# Patient Record
Sex: Female | Born: 1955 | Race: Black or African American | Hispanic: No | Smoking: Never smoker
Health system: Southern US, Community
[De-identification: ages and names within clinical notes are randomized; demographics above are authoritative.]

## PROBLEM LIST (undated history)

## (undated) DIAGNOSIS — I509 Heart failure, unspecified: Secondary | ICD-10-CM

## (undated) DIAGNOSIS — R011 Cardiac murmur, unspecified: Secondary | ICD-10-CM

## (undated) DIAGNOSIS — D869 Sarcoidosis, unspecified: Secondary | ICD-10-CM

## (undated) DIAGNOSIS — I10 Essential (primary) hypertension: Secondary | ICD-10-CM

## (undated) DIAGNOSIS — I8500 Esophageal varices without bleeding: Secondary | ICD-10-CM

## (undated) DIAGNOSIS — J45909 Unspecified asthma, uncomplicated: Secondary | ICD-10-CM

## (undated) DIAGNOSIS — K766 Portal hypertension: Secondary | ICD-10-CM

## (undated) DIAGNOSIS — D638 Anemia in other chronic diseases classified elsewhere: Secondary | ICD-10-CM

## (undated) DIAGNOSIS — F1010 Alcohol abuse, uncomplicated: Secondary | ICD-10-CM

## (undated) DIAGNOSIS — K7030 Alcoholic cirrhosis of liver without ascites: Secondary | ICD-10-CM

## (undated) DIAGNOSIS — E039 Hypothyroidism, unspecified: Secondary | ICD-10-CM

## (undated) HISTORY — PX: TUBAL LIGATION: SHX77

---

## 2008-09-27 ENCOUNTER — Emergency Department: Payer: Self-pay | Admitting: Emergency Medicine

## 2008-11-07 ENCOUNTER — Inpatient Hospital Stay: Payer: Self-pay | Admitting: Internal Medicine

## 2010-01-09 ENCOUNTER — Emergency Department: Payer: Self-pay | Admitting: Emergency Medicine

## 2010-01-10 ENCOUNTER — Inpatient Hospital Stay: Payer: Self-pay | Admitting: Internal Medicine

## 2010-02-20 ENCOUNTER — Other Ambulatory Visit: Payer: Self-pay | Admitting: Nephrology

## 2010-08-25 ENCOUNTER — Ambulatory Visit: Payer: Self-pay | Admitting: Unknown Physician Specialty

## 2011-02-17 ENCOUNTER — Ambulatory Visit: Payer: Self-pay | Admitting: Family Medicine

## 2011-02-19 ENCOUNTER — Ambulatory Visit: Payer: Self-pay | Admitting: Family Medicine

## 2013-05-15 ENCOUNTER — Inpatient Hospital Stay: Payer: Self-pay | Admitting: Specialist

## 2013-05-15 LAB — CBC
HGB: 12 g/dL (ref 12.0–16.0)
MCHC: 34.2 g/dL (ref 32.0–36.0)
MCV: 102 fL — ABNORMAL HIGH (ref 80–100)
RBC: 3.45 10*6/uL — ABNORMAL LOW (ref 3.80–5.20)
RDW: 15.6 % — ABNORMAL HIGH (ref 11.5–14.5)
WBC: 10.4 10*3/uL (ref 3.6–11.0)

## 2013-05-15 LAB — COMPREHENSIVE METABOLIC PANEL
Albumin: 3.5 g/dL (ref 3.4–5.0)
Alkaline Phosphatase: 114 U/L (ref 50–136)
Anion Gap: 12 (ref 7–16)
BUN: 10 mg/dL (ref 7–18)
Calcium, Total: 8.7 mg/dL (ref 8.5–10.1)
Chloride: 104 mmol/L (ref 98–107)
Co2: 23 mmol/L (ref 21–32)
Creatinine: 1.04 mg/dL (ref 0.60–1.30)
EGFR (African American): >60
Osmolality: 277 (ref 275–301)
SGOT(AST): 233 U/L — ABNORMAL HIGH (ref 15–37)
SGPT (ALT): 49 U/L (ref 12–78)
Total Protein: 8.7 g/dL — ABNORMAL HIGH (ref 6.4–8.2)

## 2013-05-15 LAB — CK TOTAL AND CKMB (NOT AT ARMC): CK-MB: 3 ng/mL (ref 0.5–3.6)

## 2013-05-15 LAB — APTT: Activated PTT: 28.9 secs (ref 23.6–35.9)

## 2013-05-15 LAB — PROTIME-INR
INR: >15.1
INR: 1.1
Prothrombin Time: 14.8 secs — ABNORMAL HIGH (ref 11.5–14.7)

## 2013-05-15 LAB — GASTROCCULT (ARMC): Ph, Gastric: 2 (ref 1–3)

## 2013-05-15 LAB — HEMOGLOBIN: HGB: 11.7 g/dL — ABNORMAL LOW (ref 12.0–16.0)

## 2013-05-15 LAB — TROPONIN I: Troponin-I: <0.02 ng/mL

## 2013-05-16 LAB — CBC WITH DIFFERENTIAL/PLATELET
HGB: 11.4 g/dL — ABNORMAL LOW (ref 12.0–16.0)
Lymphocyte %: 4.3 %
MCH: 35.8 pg — ABNORMAL HIGH (ref 26.0–34.0)
MCHC: 34.4 g/dL (ref 32.0–36.0)
MCV: 104 fL — ABNORMAL HIGH (ref 80–100)
Monocyte #: 0.6 x10 3/mm (ref 0.2–0.9)
Monocyte %: 5.9 %
Neutrophil #: 9.2 10*3/uL — ABNORMAL HIGH (ref 1.4–6.5)
Neutrophil %: 89.6 %
Platelet: 70 10*3/uL — ABNORMAL LOW (ref 150–440)
RDW: 15.2 % — ABNORMAL HIGH (ref 11.5–14.5)

## 2013-05-16 LAB — TROPONIN I: Troponin-I: <0.02 ng/mL

## 2013-05-16 LAB — TSH: Thyroid Stimulating Horm: 24.9 u[IU]/mL — ABNORMAL HIGH

## 2013-05-17 LAB — CBC WITH DIFFERENTIAL/PLATELET
Basophil #: 0 10*3/uL (ref 0.0–0.1)
Basophil %: 0.5 %
Eosinophil #: 0.1 10*3/uL (ref 0.0–0.7)
Eosinophil %: 1.4 %
HCT: 33.6 % — ABNORMAL LOW (ref 35.0–47.0)
Lymphocyte #: 0.5 10*3/uL — ABNORMAL LOW (ref 1.0–3.6)
MCH: 35.3 pg — ABNORMAL HIGH (ref 26.0–34.0)
MCHC: 33.8 g/dL (ref 32.0–36.0)
MCV: 105 fL — ABNORMAL HIGH (ref 80–100)
Monocyte #: 0.5 x10 3/mm (ref 0.2–0.9)
Neutrophil %: 84 %
Platelet: 79 10*3/uL — ABNORMAL LOW (ref 150–440)
RBC: 3.21 10*6/uL — ABNORMAL LOW (ref 3.80–5.20)
RDW: 15.3 % — ABNORMAL HIGH (ref 11.5–14.5)
WBC: 7 10*3/uL (ref 3.6–11.0)

## 2013-05-17 LAB — AFP TUMOR MARKER: AFP-Tumor Marker: 12.5 ng/mL — ABNORMAL HIGH (ref 0.0–8.3)

## 2013-05-17 LAB — CREATININE, SERUM
Creatinine: 1.13 mg/dL (ref 0.60–1.30)
EGFR (African American): >60

## 2013-05-17 LAB — HCG, QUANTITATIVE, PREGNANCY: Beta Hcg, Quant.: <1 m[IU]/mL — ABNORMAL LOW

## 2013-05-18 LAB — BASIC METABOLIC PANEL
BUN: 12 mg/dL (ref 7–18)
Calcium, Total: 8.3 mg/dL — ABNORMAL LOW (ref 8.5–10.1)
Chloride: 109 mmol/L — ABNORMAL HIGH (ref 98–107)
Co2: 26 mmol/L (ref 21–32)
Creatinine: 1.12 mg/dL (ref 0.60–1.30)
EGFR (African American): >60
EGFR (Non-African Amer.): 55 — ABNORMAL LOW
Glucose: 89 mg/dL (ref 65–99)
Potassium: 3.5 mmol/L (ref 3.5–5.1)

## 2013-05-31 ENCOUNTER — Ambulatory Visit: Payer: Self-pay | Admitting: Gastroenterology

## 2013-08-08 ENCOUNTER — Ambulatory Visit: Payer: Self-pay | Admitting: Urgent Care

## 2013-08-08 LAB — CREATININE, SERUM: EGFR (Non-African Amer.): 39 — ABNORMAL LOW

## 2013-12-16 ENCOUNTER — Inpatient Hospital Stay: Payer: Self-pay | Admitting: Internal Medicine

## 2013-12-16 LAB — COMPREHENSIVE METABOLIC PANEL
ALBUMIN: 3.4 g/dL (ref 3.4–5.0)
ALT: 35 U/L (ref 12–78)
Alkaline Phosphatase: 81 U/L
Anion Gap: 13 (ref 7–16)
BUN: 30 mg/dL — ABNORMAL HIGH (ref 7–18)
Bilirubin,Total: 0.3 mg/dL (ref 0.2–1.0)
CREATININE: 1.56 mg/dL — AB (ref 0.60–1.30)
Calcium, Total: 8.5 mg/dL (ref 8.5–10.1)
Chloride: 107 mmol/L (ref 98–107)
Co2: 21 mmol/L (ref 21–32)
EGFR (Non-African Amer.): 36 — ABNORMAL LOW
GFR CALC AF AMER: 42 — AB
Glucose: 88 mg/dL (ref 65–99)
Osmolality: 287 (ref 275–301)
Potassium: 3.6 mmol/L (ref 3.5–5.1)
SGOT(AST): 136 U/L — ABNORMAL HIGH (ref 15–37)
Sodium: 141 mmol/L (ref 136–145)
Total Protein: 8.6 g/dL — ABNORMAL HIGH (ref 6.4–8.2)

## 2013-12-16 LAB — PROTIME-INR
INR: 1.3
Prothrombin Time: 15.5 secs — ABNORMAL HIGH (ref 11.5–14.7)

## 2013-12-16 LAB — URINALYSIS, COMPLETE
BLOOD: NEGATIVE
Bilirubin,UR: NEGATIVE
Glucose,UR: NEGATIVE mg/dL (ref 0–75)
Hyaline Cast: 3
Leukocyte Esterase: NEGATIVE
NITRITE: NEGATIVE
PH: 5 (ref 4.5–8.0)
Protein: 30
RBC,UR: 1 /HPF (ref 0–5)
Specific Gravity: 1.028 (ref 1.003–1.030)
Squamous Epithelial: 9
WBC UR: 4 /HPF (ref 0–5)

## 2013-12-16 LAB — CBC WITH DIFFERENTIAL/PLATELET
BASOS ABS: 0.1 10*3/uL (ref 0.0–0.1)
BASOS ABS: 0.1 10*3/uL (ref 0.0–0.1)
BASOS PCT: 2 %
BASOS PCT: 1.3 %
EOS PCT: 0.3 %
Eosinophil #: 0 10*3/uL (ref 0.0–0.7)
Eosinophil #: 0 10*3/uL (ref 0.0–0.7)
Eosinophil %: 0.1 %
HCT: 23.2 % — ABNORMAL LOW (ref 35.0–47.0)
HCT: 22.3 % — ABNORMAL LOW (ref 35.0–47.0)
HGB: 7.6 g/dL — ABNORMAL LOW (ref 12.0–16.0)
HGB: 7.2 g/dL — ABNORMAL LOW (ref 12.0–16.0)
LYMPHS ABS: 0.6 10*3/uL — AB (ref 1.0–3.6)
LYMPHS PCT: 17.5 %
Lymphocyte #: 0.4 10*3/uL — ABNORMAL LOW (ref 1.0–3.6)
Lymphocyte %: 9.5 %
MCH: 32 pg (ref 26.0–34.0)
MCH: 32.1 pg (ref 26.0–34.0)
MCHC: 32.6 g/dL (ref 32.0–36.0)
MCHC: 32.5 g/dL (ref 32.0–36.0)
MCV: 98 fL (ref 80–100)
MCV: 99 fL (ref 80–100)
MONO ABS: 0.3 x10 3/mm (ref 0.2–0.9)
MONOS PCT: 8.9 %
Monocyte #: 0.3 x10 3/mm (ref 0.2–0.9)
Monocyte %: 8.6 %
NEUTROS ABS: 2.6 10*3/uL (ref 1.4–6.5)
NEUTROS ABS: 3.1 10*3/uL (ref 1.4–6.5)
NEUTROS PCT: 71.6 %
Neutrophil %: 80.2 %
Platelet: 148 10*3/uL — ABNORMAL LOW (ref 150–440)
Platelet: 116 10*3/uL — ABNORMAL LOW (ref 150–440)
RBC: 2.36 10*6/uL — AB (ref 3.80–5.20)
RBC: 2.25 10*6/uL — ABNORMAL LOW (ref 3.80–5.20)
RDW: 16.2 % — ABNORMAL HIGH (ref 11.5–14.5)
RDW: 17.2 % — AB (ref 11.5–14.5)
WBC: 3.7 10*3/uL (ref 3.6–11.0)
WBC: 3.9 10*3/uL (ref 3.6–11.0)

## 2013-12-16 LAB — ETHANOL
ETHANOL LVL: 9 mg/dL
Ethanol %: 0.009 % (ref 0.000–0.080)

## 2013-12-16 LAB — HEMOGLOBIN: HGB: 9.3 g/dL — AB (ref 12.0–16.0)

## 2013-12-16 LAB — LIPASE, BLOOD: Lipase: 114 U/L (ref 73–393)

## 2013-12-17 LAB — COMPREHENSIVE METABOLIC PANEL
ANION GAP: 4 — AB (ref 7–16)
Albumin: 2.8 g/dL — ABNORMAL LOW (ref 3.4–5.0)
Alkaline Phosphatase: 68 U/L
BUN: 27 mg/dL — AB (ref 7–18)
Bilirubin,Total: 0.6 mg/dL (ref 0.2–1.0)
CALCIUM: 7.3 mg/dL — AB (ref 8.5–10.1)
Chloride: 111 mmol/L — ABNORMAL HIGH (ref 98–107)
Co2: 26 mmol/L (ref 21–32)
Creatinine: 1.41 mg/dL — ABNORMAL HIGH (ref 0.60–1.30)
EGFR (Non-African Amer.): 41 — ABNORMAL LOW
GFR CALC AF AMER: 48 — AB
Glucose: 184 mg/dL — ABNORMAL HIGH (ref 65–99)
OSMOLALITY: 291 (ref 275–301)
Potassium: 4.1 mmol/L (ref 3.5–5.1)
SGOT(AST): 77 U/L — ABNORMAL HIGH (ref 15–37)
SGPT (ALT): 27 U/L (ref 12–78)
Sodium: 141 mmol/L (ref 136–145)
Total Protein: 7.5 g/dL (ref 6.4–8.2)

## 2013-12-17 LAB — CBC WITH DIFFERENTIAL/PLATELET
Basophil #: 0 10*3/uL
Basophil %: 0.8 %
Eosinophil #: 0 10*3/uL
Eosinophil %: 0.8 %
HCT: 30.2 % — ABNORMAL LOW
HGB: 9.9 g/dL — ABNORMAL LOW
Lymphocyte %: 8.4 %
Lymphs Abs: 0.4 10*3/uL — ABNORMAL LOW
MCH: 31.3 pg
MCHC: 32.9 g/dL
MCV: 95 fL
Monocyte #: 0.4 10*3/uL
Monocyte %: 9.1 %
Neutrophil #: 3.8 10*3/uL
Neutrophil %: 80.9 %
Platelet: 104 10*3/uL — ABNORMAL LOW
RBC: 3.17 X10 6/mm 3 — ABNORMAL LOW
RDW: 17.8 % — ABNORMAL HIGH
WBC: 4.7 10*3/uL

## 2013-12-17 LAB — TSH: Thyroid Stimulating Horm: 58.6 u[IU]/mL — ABNORMAL HIGH

## 2013-12-17 LAB — MAGNESIUM: Magnesium: 2.7 mg/dL — ABNORMAL HIGH

## 2013-12-18 LAB — CBC WITH DIFFERENTIAL/PLATELET
Basophil #: 0 10*3/uL (ref 0.0–0.1)
Basophil %: 0.3 %
EOS PCT: 2 %
Eosinophil #: 0.1 10*3/uL (ref 0.0–0.7)
HCT: 29.6 % — AB (ref 35.0–47.0)
HGB: 9.7 g/dL — ABNORMAL LOW (ref 12.0–16.0)
LYMPHS PCT: 11.9 %
Lymphocyte #: 0.5 10*3/uL — ABNORMAL LOW (ref 1.0–3.6)
MCH: 31.3 pg (ref 26.0–34.0)
MCHC: 32.7 g/dL (ref 32.0–36.0)
MCV: 96 fL (ref 80–100)
MONOS PCT: 9.1 %
Monocyte #: 0.4 x10 3/mm (ref 0.2–0.9)
NEUTROS ABS: 3 10*3/uL (ref 1.4–6.5)
NEUTROS PCT: 76.7 %
PLATELETS: 107 10*3/uL — AB (ref 150–440)
RBC: 3.09 10*6/uL — ABNORMAL LOW (ref 3.80–5.20)
RDW: 17.6 % — ABNORMAL HIGH (ref 11.5–14.5)
WBC: 3.9 10*3/uL (ref 3.6–11.0)

## 2014-09-23 HISTORY — PX: ESOPHAGOGASTRODUODENOSCOPY W/ BANDING: SHX1530

## 2014-09-25 ENCOUNTER — Inpatient Hospital Stay: Payer: Self-pay | Admitting: Internal Medicine

## 2014-09-25 LAB — ETHANOL: Ethanol: 51 mg/dL

## 2014-09-25 LAB — COMPREHENSIVE METABOLIC PANEL
ALBUMIN: 4 g/dL (ref 3.4–5.0)
AST: 317 U/L — AB (ref 15–37)
Alkaline Phosphatase: 201 U/L — ABNORMAL HIGH
Anion Gap: 20 — ABNORMAL HIGH (ref 7–16)
BUN: 24 mg/dL — AB (ref 7–18)
Bilirubin,Total: 1.7 mg/dL — ABNORMAL HIGH (ref 0.2–1.0)
CALCIUM: 8.4 mg/dL — AB (ref 8.5–10.1)
CREATININE: 1.19 mg/dL (ref 0.60–1.30)
Chloride: 102 mmol/L (ref 98–107)
Co2: 16 mmol/L — ABNORMAL LOW (ref 21–32)
EGFR (African American): >60
EGFR (Non-African Amer.): 50 — ABNORMAL LOW
GLUCOSE: 55 mg/dL — AB (ref 65–99)
Osmolality: 277 (ref 275–301)
Potassium: 3.8 mmol/L (ref 3.5–5.1)
SGPT (ALT): 78 U/L — ABNORMAL HIGH
Sodium: 138 mmol/L (ref 136–145)
Total Protein: 9.8 g/dL — ABNORMAL HIGH (ref 6.4–8.2)

## 2014-09-25 LAB — URINALYSIS, COMPLETE
Bacteria: NONE SEEN
Bilirubin,UR: NEGATIVE
GLUCOSE, UR: NEGATIVE mg/dL (ref 0–75)
Leukocyte Esterase: NEGATIVE
NITRITE: NEGATIVE
PH: 5 (ref 4.5–8.0)
PROTEIN: NEGATIVE
RBC, UR: NONE SEEN /HPF (ref 0–5)
SPECIFIC GRAVITY: 1.023 (ref 1.003–1.030)
WBC UR: 1 /HPF (ref 0–5)

## 2014-09-25 LAB — PROTIME-INR
INR: 1.1
Prothrombin Time: 14.1 secs (ref 11.5–14.7)

## 2014-09-25 LAB — CBC
HCT: 33.1 % — ABNORMAL LOW (ref 35.0–47.0)
HGB: 10.4 g/dL — ABNORMAL LOW (ref 12.0–16.0)
MCH: 30.7 pg (ref 26.0–34.0)
MCHC: 31.3 g/dL — ABNORMAL LOW (ref 32.0–36.0)
MCV: 98 fL (ref 80–100)
Platelet: 209 10*3/uL (ref 150–440)
RBC: 3.38 10*6/uL — ABNORMAL LOW (ref 3.80–5.20)
RDW: 23.5 % — ABNORMAL HIGH (ref 11.5–14.5)
WBC: 4.3 10*3/uL (ref 3.6–11.0)

## 2014-09-25 LAB — CK TOTAL AND CKMB (NOT AT ARMC)
CK, Total: 296 U/L — ABNORMAL HIGH
CK-MB: 4.7 ng/mL — AB (ref 0.5–3.6)

## 2014-09-25 LAB — TROPONIN I
Troponin-I: <0.02 ng/mL
Troponin-I: <0.02 ng/mL
Troponin-I: <0.02 ng/mL

## 2014-09-25 LAB — SALICYLATE LEVEL: Salicylates, Serum: <1.7 mg/dL

## 2014-09-25 LAB — BETA-HYDROXYBUTYRIC ACID: Beta-Hydroxybutyrate: 32.7 mg/dL — ABNORMAL HIGH (ref 0.2–2.8)

## 2014-09-25 LAB — HEMOGLOBIN: HGB: 9 g/dL — ABNORMAL LOW (ref 12.0–16.0)

## 2014-09-26 LAB — CBC WITH DIFFERENTIAL/PLATELET
Basophil: 1 %
Comment - H1-Com3: NORMAL
HCT: 26.9 % — ABNORMAL LOW (ref 35.0–47.0)
HGB: 8.6 g/dL — ABNORMAL LOW (ref 12.0–16.0)
Lymphocytes: 23 %
MCH: 30.7 pg (ref 26.0–34.0)
MCHC: 31.9 g/dL — AB (ref 32.0–36.0)
MCV: 96 fL (ref 80–100)
METAMYELOCYTE: 2 %
MONOS PCT: 6 %
NRBC/100 WBC: 2 /
PLATELETS: 117 10*3/uL — AB (ref 150–440)
RBC: 2.8 10*6/uL — AB (ref 3.80–5.20)
RDW: 24.4 % — AB (ref 11.5–14.5)
Segmented Neutrophils: 68 %
WBC: 2.6 10*3/uL — ABNORMAL LOW (ref 3.6–11.0)

## 2014-09-26 LAB — BASIC METABOLIC PANEL
ANION GAP: 11 (ref 7–16)
BUN: 21 mg/dL — AB (ref 7–18)
Calcium, Total: 7.9 mg/dL — ABNORMAL LOW (ref 8.5–10.1)
Chloride: 101 mmol/L (ref 98–107)
Co2: 24 mmol/L (ref 21–32)
Creatinine: 1.28 mg/dL (ref 0.60–1.30)
EGFR (African American): 55 — ABNORMAL LOW
EGFR (Non-African Amer.): 46 — ABNORMAL LOW
Glucose: 170 mg/dL — ABNORMAL HIGH (ref 65–99)
OSMOLALITY: 279 (ref 275–301)
POTASSIUM: 4 mmol/L (ref 3.5–5.1)
Sodium: 136 mmol/L (ref 136–145)

## 2014-09-26 LAB — HEPATIC FUNCTION PANEL A (ARMC)
ALK PHOS: 139 U/L — AB
ALT: 54 U/L
AST: 157 U/L — AB (ref 15–37)
Albumin: 3.2 g/dL — ABNORMAL LOW (ref 3.4–5.0)
BILIRUBIN DIRECT: 0.9 mg/dL — AB (ref 0.0–0.2)
Bilirubin,Total: 1.7 mg/dL — ABNORMAL HIGH (ref 0.2–1.0)
TOTAL PROTEIN: 7.3 g/dL (ref 6.4–8.2)

## 2014-09-26 LAB — CK-MB: CK-MB: 4.2 ng/mL — ABNORMAL HIGH (ref 0.5–3.6)

## 2014-09-26 LAB — TSH: THYROID STIMULATING HORM: 14.1 u[IU]/mL — AB

## 2014-09-26 LAB — CK: CK, Total: 236 U/L — ABNORMAL HIGH

## 2014-09-26 LAB — HEMOGLOBIN: HGB: 9.9 g/dL — ABNORMAL LOW (ref 12.0–16.0)

## 2014-09-26 LAB — TROPONIN I: TROPONIN-I: 0.03 ng/mL

## 2014-09-27 LAB — URINE CULTURE

## 2014-09-30 LAB — CULTURE, BLOOD (SINGLE)

## 2015-02-01 ENCOUNTER — Emergency Department: Payer: Self-pay | Admitting: Emergency Medicine

## 2015-03-15 NOTE — H&P (Signed)
PATIENT NAME:  Kathryn MadeiraMAPP, Memori L MR#:  [id number] DATE OF BIRTH:  1956-09-11  DATE OF ADMISSION:  05/15/2013  PRIMARY CARE PHYSICIAN:  Leanna SatoLinda M. Miles, MD  CHIEF COMPLAINT: Chest pain on the left side today.   HISTORY OF PRESENT ILLNESS: A 59 year old African American female with a history of cirrhosis, hypertension, esophageal varices presents to the ED with chest pain today. The patient started to have chest pain about 4:00 a.m. today, which is on the left side, under left side of breast, constant, exacerbated by deep breath and a cough. In addition, the patient has a cough, sputum. The patient was sent to ED for further evaluation. The patient developed nausea and vomiting in ED with dark blood vomitus. The patient was suspected PE. CT angio was done.  Results are pending.  The patient feels a headache, dizziness and weakness, but denies any melena or bloody stool. No dysuria, hematuria or easy bleeding.   PAST MEDICAL HISTORY: Hepatic cirrhosis, history of alcohol abuse, hypertension, hypothyroidism, asthma, esophageal varices status post banding by Dr. Markham JordanElliot , anemia, due to GI bleeding.   PAST SURGICAL HISTORY: Tubal ligation.   SOCIAL HISTORY: No smoking, but drinks alcohol sometimes. The last drink was yesterday and drank 1 glass of wine.  The patient drank a lot of alcohol before. Denies any drug abuse.   FAMILY HISTORY: Father died of intracranial bleeding. Mother died of myocardial infarction.   One of her family members has hypertension, diabetes.   REVIEW OF SYSTEMS:  CONSTITUTIONAL: The patient denies any fever or chills, but has a headache, dizziness and generalized weakness.  EYES: No double vision or blurry vision.  ENT: No postnasal drip, slurred speech or dysphagia. No epistaxis.  CARDIOVASCULAR: Positive for chest pain. No palpitations. No orthopnea or nocturnal dyspnea. No leg edema.  PULMONARY: Positive for cough, sputum, mild shortness of breath. No hemoptysis.    GASTROINTESTINAL: Positive for nausea, vomiting, and vomiting blood. No melena or bloody stool.  GENITOURINARY:  No dysuria, hematuria or incontinence.  SKIN: No rash or jaundice.  NEUROLOGIC: No syncope, loss of consciousness or seizure.  HEMATOLOGIC: No easy bruising or bleeding.  ENDOCRINE: No polyuria, polydipsia, heat or cold intolerance.   ALLERGIES: DARVOCET, PERCOCET, TAMIFLU, TYLENOL, VICODIN  HOME MEDICATIONS: Spironolactone 50 mg p.o. daily, Slow Fe 45 mg p.o. 1 tablet once a day, omeprazole 20 mg p.o. b.i.d., nadolol 40 mg p.o. daily, levothyroxine 75 mcg p.o. daily, clonidine 0.1 mg p.o. daily, Norvasc 5 mg p.o. daily.   PHYSICAL EXAMINATION: VITALS: Temperature 98.3, blood pressure 183/84, pulse 98, respirations 98% on room air.  GENERAL: The patient is alert, awake, oriented, in no acute distress.  HEENT: Pupils round, equal and reactive to light and accommodation. Pink conjunctivae. No icterus. Moist oral mucosa. Clear oropharynx.  NECK: Supple. No JVD or carotid bruits. No lymphadenopathy. No thyromegaly.  CARDIOVASCULAR: S1, S2. Regular rate and rhythm. No murmurs or gallops.  PULMONARY: Bilateral air entry. No wheezing or rales. No use of accessory muscles to breathe.  ABDOMEN: Obese. Bowel sounds present. No distention, but has epigastric tenderness. No rigidity. No rebound. No organomegaly.  EXTREMITIES: No edema, clubbing or cyanosis. No calf tenderness. Strong bilateral pedal pulses.   SKIN: No rash or jaundice.  NEUROLOGIC: A and O x3. No focal deficit. Power 5/5. Sensation intact.   LABORATORY, DIAGNOSTIC AND RADIOLOGIC DATA: Stool occult is positive. INR 1.1,  CK 205, CK-MB 3.0. Troponin less than 0.02. Glucose 102, BUN 10, creatinine 1.04, sodium 139, potassium  3.1, chloride 104, bicarbonate 23, calcium 8.7. Chest x-ray showed no acute cardiopulmonary disease. WBC 10.4, hemoglobin 12.0, platelets 121, MCV 102.   IMPRESSION: 1.  Gastrointestinal bleeding,  possibly due to esophageal varices.  2.  Hepatic cirrhosis.   3.  Chest pain, atypical.  Need to rule out pulmonary embolism.  4.  Hypokalemia.  5.  History of sarcoidosis, hypothyroidism.  6.  Hypertension.  7.  Asthma.    PLAN OF TREATMENT:  1.  The patient will be kept n.p.o. We will continue Protonix drip. Continue nadolol, spironolactone. We will get a GI consult.   2.  Check hemoglobin q.6 hours for 3 times. 3.  We will follow up troponin and CT angiogram.  4.  No anticoagulation due to gastrointestinal bleeding.  5.  For hypokalemia, we will give potassium and follow up BMP and magnesium level.  6.  TEDs for deep vein thrombosis prophylaxis.   I discussed the patient's condition and plan of treatment with the patient, the patient's sister.   CODE STATUS:  The patient wants full code.   TIME SPENT: About 60 minutes.    ____________________________ Shaune Pollack, MD qc:cc D: 05/15/2013 15:26:06 ET T: 05/15/2013 16:02:04 ET JOB#: [id number]  cc: Shaune Pollack, MD, <Dictator> Shaune Pollack MD ELECTRONICALLY SIGNED 05/16/2013 15:05

## 2015-03-15 NOTE — Consult Note (Signed)
Brief Consult Note: Diagnosis: Hematemesis, Cirrhosis.   Patient was seen by consultant.   Consult note dictated.   Comments: [name] is a pleasant 59 y/o black female with hx of ETOH cirrhosis & esophageal varices who presented with chest pain, nausea, vomiting & hematemesis.  Hgb is 11.4.  She has been started on protonix gtt.  Chest CT shows thickening distal esophagus & low-attenuation lesion on periphery of liver.  Pt has been lost in follow-up, previously saw Dr Mechele CollinElliott.  Last EGD was in 08/2010 where she had Grade III varices banded & changes of portal hypertensive gastropathy.  She has a hx non-compliance & continues to consume ETOH regularly.  Plan: 1) Agree w/ protonix gtt 2) EGD today with Dr Servando SnareWohl 3) Agree w/ nadolol 4) Agree w/ Spironolactone 5) K/Mag repletion per attending 6) AFP 7) Follow up with liver MRI after EGD 8) Urged ETOH cessation.  She is in contemplation phase 9) Monitor hgb  Please see full dictation.  Thanks for consult. #[id number].  Electronic Signatures: Joselyn ArrowJones, Sheelah Ritacco L (NP)  (Signed 24-Jun-14 09:10)  Authored: Brief Consult Note   Last Updated: 24-Jun-14 09:10 by Joselyn ArrowJones, Nimisha Rathel L (NP)

## 2015-03-15 NOTE — Consult Note (Signed)
PATIENT NAME:  [name], Kathryn L MR#:  [id number] DATE OF BIRTH:  11/17/1956  DATE OF CONSULTATION:  05/16/2013  REFERRING PHYSICIAN:  Dr. Chen CONSULTING PHYSICIAN:  Kandice L. Jones, NP  GASTROENTEROLOGIST:  Darren Wohl, MD  PREVIOUS GASTROENTEROLOGIST:  Robert Elliott, MD  PRIMARY CARE PHYSICIAN: Linda Miles, MD ([facility])  REASON FOR CONSULTATION: Hematemesis and history of cirrhosis.   HISTORY OF PRESENT ILLNESS: Kathryn [name] is a 59-year-old black female who was in her usual state of health and developed severe chest pain at 1:00 a.m. yesterday. Pain began in her left anterior chest just below her left breast. The pain was worse with coughing and deep breath. She was coughing up clear phlegm. She gives history of acid reflux and takes omeprazole 20 mg b.i.d. at home. Around 8:00 a.m. she began to have nausea and vomiting yesterday with initially yellow emesis and then it became bright red blood and then dark coffee-ground emesis. She was transported to the hospital via EMS. She has history of alcoholic cirrhosis diagnosed in June of 1997. She tells me she quit drinking for about 18 months, however, she started back about 6 months ago. She usually consumes 3 glasses of wine a couple times per week as well as some liquor. She has been seen at [facility] back in the fall of 2013.  She believes she was seen at the liver clinic; however, she did not follow up with this. She does have some left upper quadrant tenderness and epigastric tenderness. She denies any significant abdominal pain at this point. She denies any rectal bleeding or melena. She did have a few loose stools yesterday. Denies any fever. No ill contacts. Denies any dysphagia or odynophagia. She reports a 13 pound weight loss in the last 6 months. Her appetite is not good first thing in the morning, however, it usually improves around lunchtime. She does not wear dentures. She denies any recent NSAID use.   CT scan of chest with contrast  showed no evidence of pulmonary embolus and indeterminate soft tissue mass in the deep sternoclavicular joint on the left. It also showed fatty liver with cirrhosis and changes of portal venous hypertension, splenomegaly and low attenuation lesions around the periphery of the liver. CT with hepatic protocol versus MRI is recommended. It showed varices in splenic hilum and paraesophageal varices. She had diffuse thickening of the distal esophagus and stomach loops suggestive of gastroenteritis and/or colitis. She has evidence of medical renal disease. She had a benign chest x-ray.   Her endoscopic work-up has included an EGD by Dr. Elliott, 08/25/2010, where she had grade 3 varices, which were banded, and changes of portal hypertensive gastropathy. She had colonoscopy the same date which showed internal hemorrhoids. On 01/13/2013, she had grade 2 to 3 varices, which were banded, on EGD by Dr. Elliott. She had an EGD 11/09/2008 where she was found to have large varices and variceal bleed.   PAST MEDICAL AND SURGICAL HISTORY:  1.  Alcoholic cirrhosis with portal hypertensive gastropathy, splenomegaly, history of esophageal variceal bleed and multiple bandings, as described in HPI.  2.  Graves disease status post RAI.  3.  Asthma.  4.  Alcohol abuse.  5.  Chronic iron deficiency anemia.  6.  Sarcoidosis.  7.  Medical noncompliance.  8.  Hypertension.   MEDICATIONS:  Prior to admission: 1.  Amlodipine 5 mg daily. 2.  Clonidine 0.1 mg daily. 3.  Levothyroxine 75 mcg. 4.  Nadolol 40 mg daily.  5.    Omeprazole 20 mg b.i.d. 6.  Slow FE 45 mg extended release daily. 7.  Spironolactone 50 mg daily.   ALLERGIES: DARVOCET-N CAUSES HEADACHES, PERCOCET HEADACHES, TAMIFLU UNKNOWN, TYLENOL UNKNOWN, AND VICODIN NAUSEA AND VOMITING.   FAMILY HISTORY: There is no known family history of chronic liver disease. Otherwise noncontributory.   SOCIAL HISTORY: She has history of alcohol abuse, as stated above. No  illicit drug use. Denies tobacco use. She is disabled, lives alone and is raising her 2 grandchildren, ages 10 and 12.   REVIEW OF SYSTEMS:  See HPI.  Respiratory:  She has shortness of breath on exertion, cough with clear phlegm.  Otherwise, negative complete review of systems.  PHYSICAL EXAMINATION: VITAL SIGNS: Temp 98.5, pulse 66, respirations 20, blood pressure 136/76 and O2 sat 95% on room air.  GENERAL: She is a well-developed, obese black female who is alert, oriented, pleasant, and cooperative, in no acute distress.  HEENT: Sclerae clear. Anicteric. Conjunctivae pink. Oropharynx pink and moist. She is edentulous.  NECK: Supple without any mass or thyromegaly.  CHEST: Heart regular rate and rhythm with normal S1 and S2 without murmurs, clicks rubs or gallops.  LUNGS: Clear to auscultation bilaterally.  ABDOMEN: Protuberant with positive bowel sounds x 4. No bruits auscultated. Abdomen is soft and nontender with palpable splenomegaly. No rebound tenderness or guarding. No tense ascites.  EXTREMITIES: She has trace lower pretibial edema bilaterally.  No asterixis.  PSYCHIATRIC: She is alert and oriented x 3. She has normal mood and affect.  SKIN: Warm and dry without any rash or jaundice.   LABORATORY DATA:  Glucose 102, potassium 3.1, and magnesium 1.1, otherwise normal met-7. Total protein 8.7 and AST 233, otherwise normal LFTs. Cardiac markers negative x 3. TSH abnormal at 24.9.  Hemoglobin 11.4, hematocrit 33.1 and platelets 70. White blood cell count 10.3, INR 1.1. She was Gastroccult positive.   IMAGING: See HPI.  IMPRESSION: Kathryn [name] is a pleasant 59-year-old female with history of alcoholic cirrhosis and esophageal varices who presented with chest pain, nausea, vomiting and hematemesis. Her hemoglobin is 11.4. She has a history of chronic anemia. She has been started on Protonix drip.   Her chest CT shows thickening of the distal esophagus and a low attenuation lesion in the  periphery of the liver. She has been lost in follow-up, previously saw Dr. Elliott. Her last EGD was in October 2011 where she had grade 3 varices banded and changes of portal hypertensive gastropathy. She has a history of medical noncompliance and continues to consume alcohol on a regular basis.  She also has an abnormal TSH with history of Graves disease status post radioactive iodine. She has hypokalemia and hypomagnesemia.   PLAN: 1.  Agree with Protonix drip.  2.  EGD today with Dr. Wohl. I have discussed the procedure including risks and benefits which include but are not limited to bleeding, infection, perforation and drug reaction. She agreed with the plan and consent will be obtained.  3.  Agree with nadolol. 4.  Agree with spironolactone.  5.  Potassium and magnesium repletion per attending.  6.  AFP.  7.  Follow up with liver MRI after EGD.  8.  Urged alcohol cessation. She is in the contemplation phase.  9.  Monitor hemoglobin.   We would like to thank you for allowing us to participate in the care of Kathryn [name].  This services provided by Kandice L. Jones, NP, under collaborative agreement with Dr. Darren Wohl.  ____________________________ Kandice L. Jones,   NP klj:[id number] D: 05/16/2013 09:20:53 ET T: 05/16/2013 09:46:53 ET JOB#: [id number]  cc: Kandice L. Jones, NP, <Dictator> Linda M. Miles, MD KANDICE L JONES FNP ELECTRONICALLY SIGNED 05/16/2013 11:28 

## 2015-03-15 NOTE — Consult Note (Signed)
Chief Complaint:  Subjective/Chief Complaint No further N/V/hematemesis.  1 soft brown BM in past 24h. Tolerating diet well. MRI reviewed w/ pt.   VITAL SIGNS/ANCILLARY NOTES: **Vital Signs.:   26-Jun-14 08:08  Temperature Temperature (F) 97.5  Celsius 36.3  Pulse Pulse 53  Systolic BP Systolic BP 646  Diastolic BP (mmHg) Diastolic BP (mmHg) 68  Mean BP 94  Pulse Ox % Pulse Ox % 100  Pulse Ox Activity Level  At rest  Oxygen Delivery Room Air/ 21 %   Brief Assessment:  GEN well developed, well nourished, no acute distress, A/Ox3.   Cardiac Regular  + LE edema  trace bilat LEE   Respiratory normal resp effort   Gastrointestinal details normal Soft  Nontender  Nondistended  Bowel sounds normal   Additional Physical Exam Skin: warm & dry   Lab Results: Routine Chem:  26-Jun-14 04:11   Glucose, Serum 89  BUN 12  Creatinine (comp) 1.12  Sodium, Serum 140  Potassium, Serum 3.5  Chloride, Serum  109  CO2, Serum 26  Calcium (Total), Serum  8.3  Anion Gap  5  Osmolality (calc) 279  eGFR (African American) >60  eGFR (Non-African American)  55 (eGFR values <24m/min/1.73 m2 may be an indication of chronic kidney disease (CKD). Calculated eGFR is useful in patients with stable renal function. The eGFR calculation will not be reliable in acutely ill patients when serum creatinine is changing rapidly. It is not useful in  patients on dialysis. The eGFR calculation may not be applicable to patients at the low and high extremes of body sizes, pregnant women, and vegetarians.)   Radiology Results: MRI:    25-Jun-14 14:22, MRI Abdomen WWO  MRI Abdomen WWO   REASON FOR EXAM:    focus on liver, compare w/ CT findings, elevated AFP,   ETOH cirrhosis  COMMENTS:       PROCEDURE: MR  - MR ABDOMEN WO/W CONTRAST  - May 17 2013  2:22PM     RESULT: History: Cirrhosis    Comparison: None    Technique: Multiplanar and multisequence imaging of the abdomen is   performed prior to  and following 19 mL of Multihance intravenous contrast.    Findings:  The gallbladder is normal. There is no intrahepatic or extrahepatic   biliary ductal dilatation.    There multiple bilateral T2 hyperintense nonenhancing renal lesions   likely representing cysts. There is mild splenomegaly without focal   abnormality. The adrenal glands and pancreas are normal. The osseous   structures are unremarkable.    There is a heterogeneousappearance of the posterior right hepatic lobe.   There is a heterogeneous low signal area in the right hepatic lobe   measuring 1.8 cm without enhancement on postcontrast images. There is no   arterially enhancing mass within the liver. There are paraesophageal   varices. The portal vein is patent.     The abdominal aorta is normal in caliber. There is no abdominal free     fluid.    IMPRESSION:     1.There is a heterogeneous appearance of the posterior right hepatic   lobe. There is a heterogeneous low signal area in the right hepatic lobe   measuring 1.8 cm without enhancement on postcontrast images. These may   represent areas of regenerating nodules although atypical hepatomas can   have a similar appearance. Followup MRI or CT of the abdomen is   recommended in 3 months.    2. Esophageal varices.  3. Mild splenomegaly.    Verified By: Jennette Banker, M.D., MD   Assessment/Plan:  Assessment/Plan:  Assessment GI bleeding secondary to esophageal varices: resolved s/p 4 bands via EGD 1.8cm right hepatic lobe lesion w/ elevated AFP:  Will need MRI 3 mo. Decompensated ETOH cirrhosis:  Stable   Plan 1) MRI Liver 3 months. We will arrange. 2) OV in 6 weeks to set up FU EGD with Dr Allen Norris in 8 weeks 3) Continue BID PPI 4) ETOH cessation discussed again 5) Continue nadolol indefinitely 6) Continue aldactone 7) Pt should FU with PCP outpatient   Electronic Signatures: Andria Meuse (NP)  (Signed 26-Jun-14 09:21)  Authored: Chief  Complaint, VITAL SIGNS/ANCILLARY NOTES, Brief Assessment, Lab Results, Radiology Results, Assessment/Plan   Last Updated: 26-Jun-14 09:21 by Andria Meuse (NP)

## 2015-03-15 NOTE — Consult Note (Signed)
Chief Complaint:  Subjective/Chief Complaint Chest pain resolved.  No further N/V/hematemesis.  1 soft brown BM this AM.  Hgb 11.4 (stable).  Tolerating clear liquids well.  AFP  elevated 12.5.   VITAL SIGNS/ANCILLARY NOTES: **Vital Signs.:   25-Jun-14 08:34  Temperature Temperature (F) 98.4  Celsius 36.8  Pulse Pulse 54  Respirations Respirations 19  Systolic BP Systolic BP 157  Diastolic BP (mmHg) Diastolic BP (mmHg) 90  Mean BP 112  Pulse Ox % Pulse Ox % 97  Pulse Ox Activity Level  At rest  Oxygen Delivery Room Air/ 21 %   Brief Assessment:  GEN well developed, well nourished, no acute distress, A/Ox3.   Cardiac Regular  + LE edema  trace bilat LEE   Respiratory normal resp effort   Gastrointestinal details normal Soft  Nontender  Nondistended  Bowel sounds normal   Additional Physical Exam Skin: warm & dry   Lab Results: Routine Hem:  25-Jun-14 05:17   WBC (CBC) 7.0  RBC (CBC)  3.21  Hemoglobin (CBC)  11.4  Hematocrit (CBC)  33.6  Platelet Count (CBC)  79  MCV  105  MCH  35.3  MCHC 33.8  RDW  15.3  Neutrophil % 84.0  Lymphocyte % 6.5  Monocyte % 7.6  Eosinophil % 1.4  Basophil % 0.5  Neutrophil # 5.9  Lymphocyte #  0.5  Monocyte # 0.5  Eosinophil # 0.1  Basophil # 0.0 (Result(s) reported on 17 May 2013 at 05:43AM.)   Assessment/Plan:  Assessment/Plan:  Assessment GI bleeding secondary to esophageal varices: resolved s/p 4 bands placed via EGD yesterday by Dr Servando SnareWohl Anemia:  Hgb stable. Decompensated ETOH cirrhosis:  AFP elevated.  Abnormal liver on CT.  Will need MRI.   Plan 1) MRI Liver 2) FU EGD with Dr Servando SnareWohl in 8 weeks 3) Change protonix to 40mg  BID 4) ETOH cessation discussed again 5) Continue nadolol indefinitely 6) Continue aldactone.  Check BMP tomorrow.   Electronic Signatures: Joselyn ArrowJones, Jennice Renegar L (NP)  (Signed 25-Jun-14 09:31)  Authored: Chief Complaint, VITAL SIGNS/ANCILLARY NOTES, Brief Assessment, Lab Results, Assessment/Plan   Last  Updated: 25-Jun-14 09:31 by Joselyn ArrowJones, English Tomer L (NP)

## 2015-03-15 NOTE — Discharge Summary (Signed)
PATIENT NAME:  Kathryn [name], Kathryn [name] MR#:  [id number] DATE OF BIRTH:  [birth year]  DATE OF ADMISSION:  05/15/2013 DATE OF DISCHARGE:  05/18/2013  For detailed note, please see the history and physical done on admission by Dr. Imogene Burnhen.   DIAGNOSES AT DISCHARGE:   1.  Upper gastrointestinal bleed secondary to esophageal varices.  2.  Esophageal variceal bleeding status post banding.  3.  History of chronic liver disease secondary to alcohol abuse.  4.  Hypertension.  5.  Hypothyroidism.   DIET:  The patient is being discharged on a low-sodium, low-fat, mechanical soft diet.   ACTIVITY:  As tolerated.   FOLLOWUP:  With Dr. Darreld McleanLinda Miles in the next 1 to 2 weeks. Also, follow up with Dr. Midge Miniumarren Wohl in the next 2 weeks.   DISCHARGE MEDICATIONS:  Omeprazole 20 mg b.i.d., clonidine 0.1 mg daily, Synthroid 75 mcg daily, nadolol 40 mg daily, iron 45 mg extended release 1 tab daily, Aldactone 50 mg daily, Norvasc 5 mg daily.   CONSULTANTS DURING THE HOSPITAL COURSE:  Dr. Midge Miniumarren Wohl from gastroenterology.   PERTINENT STUDIES DONE DURING THE HOSPITAL COURSE:  A CT scan of the chest done with contrast on admission showing no CT evidence of pulmonary arterial embolic disease, hepatic steatosis. A chest x-ray done on admission showing no acute cardiopulmonary disease. An MRI of the abdomen showing heterogeneous appearance in the posterior right hepatic lobe. There is a heterogeneous low signal area in the right hepatic lobe measuring 1.8 cm without enhancement on postcontrast images. These may reflect areas of regenerating nodules, although atypical hepatomas have a similar appearance. Followup MRI or CT of the abdomen is recommended in 3 months. Esophageal varices and mild splenomegaly.   HOSPITAL COURSE:  This is a 59 year old female with medical problems as mentioned above presented to the hospital due to some chest pain and also noted to have some mild nausea and vomiting and noted to have hematemesis.  1.  Upper  GI bleed. This was likely the cause of the patient's chest pain and also hematemesis. This was secondary to esophageal variceal bleeding. The patient has a history of chronic liver disease and a history of esophageal varices. The patient was started on a Protonix drip. A GI consult was obtained. The patient underwent urgent endoscopy shortly after admission and her esophageal varices were banded. Post-banding, she has had no further evidence of hematemesis and her hemoglobin has remained stable. She is not tolerating p.o. well and therefore is being discharged on oral omeprazole and nadolol as mentioned. She likely needs an upper endoscopy repeated in the next 6 to 8 weeks and this is going to be arranged by Dr. Midge Miniumarren Wohl as an outpatient.  2.  Hypertension. The patient remained hemodynamically stable on her clonidine and Norvasc. She will resume that.  3.  Hypothyroidism. The patient was maintained on her Synthroid. She will resume that.  4.  History of chronic liver disease due to alcohol abuse. The patient on admission had a CT chest which showed some questionable areas of possible hepatomas on the CT. She therefore underwent an MRI of her abdomen which showed atypical findings for hepatomas. She had a mildly elevated alpha-fetoprotein. Therefore, this is not diagnostic for a hepatoma at this point. She likely needs interval followup imaging which is going to be arranged as an outpatient. This is to be done by a gastroenterologist.   CODE STATUS:  The patient is a full code.   DISPOSITION:  She is being discharged  home.   TIME SPENT:  40 minutes.   ____________________________ Rolly Pancake. Cherlynn Kaiser, MD vjs:si D: 05/18/2013 14:34:00 ET T: 05/18/2013 15:04:49 ET JOB#: [id number]  cc: Rolly Pancake. Cherlynn Kaiser, MD, <Dictator> Leanna Sato, MD Midge Minium, MD [place] Siren MD ELECTRONICALLY SIGNED 05/26/2013 15:05

## 2015-03-16 NOTE — H&P (Signed)
PATIENT NAME:  Kathryn [name], Kathryn [name] MR#:  [id number] DATE OF BIRTH:  09/16/1956  PRIMARY CARE PHYSICIAN:  Dr. Bernie CoveyMancino   CHIEF COMPLAINT: Vomiting up blood.   HISTORY OF PRESENT ILLNESS: This is a 59 year old female. The patient came in with  initial coughing and then vomiting bile then vomiting what looked like coffee ground material 2 times per to coming in around 10:00 a.m.  She also had chest pain and tightness in the chest, center of the chest. No radiation.  Still has it right now, graded at about 4 out of 10 in intensity. In the ER, she was found to have lactic acidosis, hypoglycemia, and elevated liver function tests. Hospitalist services were contacted for further evaluation. The patient states that she does have one drink of vodka per day. In the ER, her alcohol level was elevated at 51 mg/dL. The patient does have a history of esophageal varices grade 1 and portal gastropathy, and had an endoscopy back in January by Dr. Bluford Kaufmannh.   PAST MEDICAL HISTORY: Asthma, sarcoidosis, anemia, hypothyroidism, hypertension, esophageal varices, and portal hypotension. The patient states that she has fatty liver, has not been told that she had cirrhosis at this point, but looking at old record it does say cirrhosis there.    SOCIAL HISTORY: No smoking. Positive for alcohol, a drink of vodka per day. No drug use.  Is on disability. Lives with her grandchildren.   FAMILY HISTORY: Father died of a brain aneurysm. Mother died of an MI.   REVIEW OF SYSTEMS:  CONSTITUTIONAL: Positive for sweats. No fever or chills. Positive for weakness. No weight loss. No weight gain.  EYES: She does wear reading glasses.  EARS, NOSE, MOUTH, AND THROAT: Positive for sinus issues. Positive for postnasal drip. Positive for dysphagia with sodas.  CARDIOVASCULAR: Positive for chest pain. No palpitations.  RESPIRATORY: Positive for shortness of breath, no cough. No sputum. No hemoptysis.  GASTROINTESTINAL: Positive for nausea. Positive for  vomiting. Positive for hematemesis. No abdominal pain. No diarrhea. No constipation. No bright red blood per rectum. No melena.  GENITOURINARY: No burning on urination or hematuria.  MUSCULOSKELETAL: Leg pain occasionally.  NEUROLOGIC: No fainting or blackouts.  PSYCHIATRIC: No anxiety or depression.  ENDOCRINE: Positive for hypothyroidism. HEMATOLOGIC AND LYMPHATIC:  Positive for anemia.  PHYSICAL EXAMINATION: VITAL SIGNS: On presentation to the hospital included a pulse 91, blood pressure 164/85. Standing was pulse 105, blood pressure 132/79. Temperature 98.1, respirations 12, pulse oximetry 100% on room air.  GENERAL: No respiratory distress.  EYES: Conjunctivae and lids normal. Pupils equal, round, and reactive to light. Extraocular muscles intact. No nystagmus.  EARS, NOSE, MOUTH, AND THROAT: Tympanic membranes: No erythema. Nasal mucosa: No erythema. Throat: No erythema. No exudate seen. Lips and gums: No lesions.  NECK: No JVD. No bruits. No lymphadenopathy. No thyromegaly. No thyroid nodules palpated.  RESPIRATORY:  Lungs clear to auscultation. No use of accessory muscles to breathe. No rhonchi, rales, or wheeze heard.  CARDIOVASCULAR: S1, S2 normal. No gallops, rubs, or murmurs heard. Carotid upstroke 2+ bilaterally. No bruits. Dorsalis pedis pulses 1+ bilaterally, 3+ edema bilateral lower extremity.  ABDOMEN: Soft, nontender, unable to elicit organomegaly/splenomegaly. Normoactive bowel sounds. No masses felt.  LYMPHATIC: No lymph nodes in the neck.  MUSCULOSKELETAL: Edema 3+. No clubbing. No cyanosis.  SKIN: No ulcers or lesions seen.  NEUROLOGIC: Cranial nerves II through XII grossly intact. Deep tendon reflexes 1+ bilateral lower extremities.  PSYCHIATRIC: The patient is oriented to person, place, and time.  LABORATORY AND RADIOLOGICAL DATA: Ethanol level 51 mg/dL, salicylates negative, INR 1.1, PT 14.1. CPK 296,000. Troponin negative. Glucose 55, BUN 24, creatinine 1.19, sodium  138, potassium 3.8, chloride 102, CO2 of 16, calcium 8.4. Liver function tests, total bilirubin 1.7, alkaline phosphatase 201, ALT 78, AST 317, total protein 9.8, albumin 4.0. White blood cell count 4.3, hemoglobin and hematocrit 10.4 and 33.1, platelet count of 209,000.   Chest x-ray, no acute cardiopulmonary abnormality. Repeat fingersticks 43, 46.  ABG showed a pH of 7.23, pCO2 of 30, pO2 of 81, bicarbonate 12.6. Lactic acid 8.2. Abdomen flat and erect, increased stool burden. A repeat fingerstick was 34. Repeat fingerstick 70.   EKG: Normal sinus rhythm, 85 beats per minute, flipped T waves laterally.   ASSESSMENT AND PLAN: 1.  Hematemesis with history of grade 1 esophageal varices and portal gastropathy on previous endoscopy. The patient will be admitted to stepdown, serial hemoglobins, intravenous fluids, octreotide drip, and Protonix drip. I spoke with Dr. Shelle Iron to see in consultation gastroenterology. Empiric antibiotic with Zosyn for right now. The patient has been transfused before and is okay with the transfusion if needed. Currently, not an indication, but I did speak to her about and consent for blood transfusion if needed. The patient is a full code.  2.  Lactic acidosis. Could be with alcohol. Does not seem to have an infection at this point, but will give empiric antibiotics, await for blood cultures, urinalysis, and urine culture.  3.  Hypoglycemia. No history of this. This could be secondary to liver disease or alcohol itself. Will send off an insulin C-peptide, beta hydroxybutyrate, and sulfonylurea screen. Will put on D5 in the intravenous fluids and continue to monitor. If this continues may have to get an endocrinology consultation and may have to have a fasting period, but would like to get through the hematemesis and make her stable first.  4.  Alcohol abuse. Elevated alcohol level. CIWA protocol ordered.  5.  Hypothyroidism. Continue levothyroxine and check a TSH in the a.m.  6.   Elevated liver function tests, likely secondary to alcohol. Will recheck tomorrow morning.  7.  Elevated total protein. Will send off a serum protein electrophoresis.  TIME SPENT ON ADMISSION: 55 minutes.   CODE STATUS: The patient is a full code. Will be admitted to the CCU stepdown.    ____________________________ Herschell Dimes. Renae Gloss, MD rjw:LT D: 09/25/2014 16:55:18 ET T: 09/25/2014 17:45:34 ET JOB#: [id number]  cc: Herschell Dimes. Renae Gloss, MD, <Dictator> Dr. Bernie Covey  Salley Scarlet MD ELECTRONICALLY SIGNED 09/26/2014 11:01

## 2015-03-16 NOTE — Consult Note (Signed)
Pt seen and examined. Full consult to follow. Known alcoholic with cirrhosis and bleeding from esophageal varices. Had variceal banding done in 10/11 and 6/13. Never followed up since last banding. Was supposed to be on nadolol daily but ran out 2 months ago. Last EtOH drink yest. Started having some diarrhea and low abd pain, then followed by bouts of vomiting with blood last night. Came to ER. Had 4 more bouts of vomiting in ER. None since 3-4 AM. Still with mild low abd pain. Had normal colon in 2011. Hgb down. IVF, protonix iv, and octreotide IV started. Relatively stable now. Keep NPO. Order PT/INR. Ice chips ordered. Continue to moniter hgb and transfuse as needed. Plan EGD in AM unless patient has more active bleeding today. If so, will do EGD today. Will follow. Thanks.    Electronic Signatures: Lutricia Feilh, Gordan Grell (MD) (Signed on 24-Jan-15 09:19)  Authored   Last Updated: 24-Jan-15 09:23 by Lutricia Feilh, Dewight Catino (MD)

## 2015-03-16 NOTE — Discharge Summary (Signed)
PATIENT NAME:  Kathryn [name], Kathryn [name] MR#:  [id number] DATE OF BIRTH:  Oct 29, 1956  DATE OF ADMISSION:  12/16/2013 DATE OF DISCHARGE:  12/18/2013   ADMISSION DIAGNOSIS: Upper gastrointestinal bleed.  DISCHARGE DIAGNOSES:  1.  Upper gastrointestinal bleed secondary to portal gastropathy with grade 1 esophageal varices.  2.  Alcohol dependence.  3.  Hypertension.   CONSULTATIONS: Dr. Bluford Kaufmannh.   PROCEDURES: The patient underwent an EGD on 12/17/2013, which showed grade 1 esophageal varices, but she has severe portal hypertensive gastropathy.   Discharge white blood cells 3.9, hemoglobin 9.7, hematocrit 29.6, platelets 107.   KUB at discharge showed colonic wall thickening, not well visualized on plain film.   HOSPITAL COURSE: This 59 year old female presented with abdominal pain and vomiting blood. For further details, please refer to the H and P.  1.  Upper GI bleed: The patient underwent EGD on 12/17/2013. She has a history of grade 3 esophageal varices, status post banding in June 2014. She had an EGD during this hospitalization, which showed grade 1 esophageal varices. The band actually looked good, but she has severe portal gastropathy. She has not been taking her medications. Dr. Bluford Kaufmannh and I both stressed to the patient that she needs to take her nadolol. She is advanced to a soft diet, tolerating this well. She was continued on a PPI and octreotide, which has been tapered off. 2.  Anemia with acute blood loss, status post 2 units of PRBCs with a stable hemoglobin. 3.  Alcohol abuse: The patient was on CIWA protocol. uneventful detox.  4.  Hypothyroidism: Her TSH was greater than 58. She is not taking her Synthroid medication. We restarted her outpatient dose. She will need repeat TFTs in 6 weeks.  5.  Portal gastropathy: On nadolol. Again, we stressed the importance of remaining on medication.  6.  Hypertension: The patient is to continue her outpatient medications. 7.  Abdominal pain: KUB showed some  colonic wall thickening. She is not complaining of any abdominal pain now. If she continues have abdominal pain, her primary care physician should get a CT scan to rule out any other abnormal pathology.   DISCHARGE MEDICATIONS: 1.  Spironolactone 50 mg daily.  2.  Clonidine 0.1 mg daily.  3.  Slow iron 1 tablet daily.  4.  Nadolol 40 mg daily.  5.  Omeprazole 20 mg [name].i.d.  6.  Synthroid 75 mcg daily. 7.  Hydralazine 50 mg t.i.d.   DISCHARGE DIET: Low-sodium. Regular consistency, however, soft diet for 2 days.   DISCHARGE FOLLOWUP: In 6 weeks with Dr. Bluford Kaufmannoh. The patient will need [facility]. Again, if the patient continues to have abdominal pain, she needs a CT scan of her abdomen for further evaluation. The patient is medically stable for discharge.   TIME SPENT: 35 minutes.    ____________________________ Janyth ContesSital P. Juliene PinaMody, MD spm:jcm D: 12/18/2013 13:49:42 ET T: 12/18/2013 16:09:01 ET JOB#: [id number]  cc: Madisin Hasan P. Juliene PinaMody, MD, <Dictator> Ezzard StandingPaul Y. Bluford Kaufmannh, MD Leanna SatoLinda M. Miles, MD Janyth ContesSITAL P Evett Kassa MD ELECTRONICALLY SIGNED 12/19/2013 15:24

## 2015-03-16 NOTE — Consult Note (Signed)
No further bleeding. Hgb stable. EGD showed mild esophageal varices but severe portal gastropathy. No active bleeding now. However, certain that bleeding from stomach and not from esophagus. Clear liquid diet. Nadolol 40mg  daily started. Lower octreotide drip to 25ug/hr tomorrow. Pt MUST stay on nadolol after she is discharged. Thanks.  Electronic Signatures: Lutricia Feilh, Herny Scurlock (MD)  (Signed on 25-Jan-15 08:12)  Authored  Last Updated: 25-Jan-15 08:12 by Lutricia Feilh, Edouard Gikas (MD)

## 2015-03-16 NOTE — Consult Note (Signed)
PATIENT NAME:  Kathryn [name], Kathryn [name] MR#:  [id number] DATE OF BIRTH:  September 15, 1956  DATE OF CONSULTATION:  12/16/2013  CONSULTING PHYSICIAN:  Ezzard StandingPaul Y. Bluford Kaufmannh, MD  REASON FOR REFERRAL: Hematemesis.   DESCRIPTION: The patient is a 59 year old Afro-American white female with a known history of alcoholic cirrhosis who has had bleeding from the esophageal varices in the past. She has had variceal banding done in October 2011 and last time done in June 2013. Unfortunately, the patient never followed up since her last banding. She is supposed to be on daily nadolol but ran out of nadolol two months ago and has not taken any. The patient continues to drink alcohol actively. She admits to drinking alcohol the day before admission as well. Last night the patient started having some lower abdominal pain and cramping with some diarrhea. This was then followed by bouts of nausea, vomiting, and gross hematemesis. The patient then came to the Emergency Room overnight. She subsequently had 4 more bouts of vomiting of blood in the Emergency Room. She was then admitted to the Intensive Care Unit. Since 3 to 4:00 in the morning she has not had any further bleeding. The patient still complains of some mild abdominal bleeding. She had a normal colonoscopy by Dr. Mechele CollinElliott in 2011 except for some hemorrhoids. That is when one of the esophageal banding was done. Currently she denies feeling nauseous. As she is relatively stable looking.   PAST MEDICAL HISTORY: Notable for liver cirrhosis, hypertension, history of asthma. She also has hyperthyroidism as well.   PAST SURGICAL HISTORY: Includes a tubal ligation and upper endoscopies.   ALLERGIES: SHE IS ALLERGIC TO DARVOCET AND PERCOCET, TAMIFLU, TYLENOL AND VICODIN.   SOCIAL HISTORY: She denies smoking but admits to drinking pretty much every day.   FAMILY HISTORY: Notable for stroke, heart attack, hypertension, diabetes.   HOME MEDICATIONS: Include omeprazole twice a day, Aldactone 50 mg  daily, levothyroxine 75 mcg daily, clonidine 0.1 mg daily, amlodipine 5 mg daily and she is supposed to be on nadolol 40 mg daily.  REVIEW OF SYSTEMS:  She denies any fevers or chills. She does complain of weakness. There are no visual or hearing changes. There is no coughing or shortness of breath right now. There is no chest pain or palpitation. GI symptoms have been described already. She does have some diarrhea but no melena so far. The rest of the review of symptoms are negative.   PHYSICAL EXAMINATION:  VITAL SIGNS: Currently she is afebrile. She has blood pressure and vital signs stable right now. She actually does not appear to be in acute distress.  HEAD AND NECK:   Normocephalic, atraumatic HEENT:  Pupils are equally reactive. Throat was clear.  NECK: Supple.  CARDIAC: Regular rhythm and rate without murmurs.  LUNGS: Clear bilaterally.  ABDOMEN: Normoactive bowel sounds, soft. There was some mild tenderness in the lower abdomen. There is no rebound or guarding. There is no hepatomegaly right now.  NEUROLOGIC: Nonfocal.  SKIN: Negative.   LABORATORY, DIAGNOSTIC, AND RADIOLOGICAL DATA: Initial white count was 3.7, hemoglobin was 7.6, platelet count 148, creatinine was 1.56. Other electrolytes are okay. Liver enzymes showed AST 136. The rest of the liver enzymes were normal.   ASSESSMENT AND PLAN: This is a patient with recurrent bouts of upper gastrointestinal bleeding. She does have a history of varices in the past which means that she may have a recurrent variceal bleeding again, especially since she is off the nadolol. The patient was n.p.o. She  was started on IV Protonix and octreotide drip. I recommend that we continue with that right now. I did order a stat protime  level. I did order some ice chips since she appears to be relatively stable right now. We need to continue to monitor her hemoglobin and transfuse as necessary as needed. Will plan on doing an upper endoscopy in the morning  unless the patient has more active bleeding today. If she has more bleeding, then we will do the endoscopy today as opposed to tomorrow.       Thank you for the referral.  ____________________________ Ezzard Standing. Bluford Kaufmann, MD pyo:sg D: 12/17/2013 08:20:00 ET T: 12/17/2013 10:32:54 ET JOB#: [id number]  cc: Ezzard Standing. Bluford Kaufmann, MD, <Dictator>  Wallace Cullens MD ELECTRONICALLY SIGNED 12/17/2013 14:37

## 2015-03-16 NOTE — H&P (Signed)
PATIENT NAME:  Kathryn [name], Kathryn [name] MR#:  [id number] DATE OF BIRTH:  02-28-1956  DATE OF ADMISSION:  12/16/2013  PRIMARY CARE PHYSICIAN:  Dr. Darreld Mclean.   CHIEF COMPLAINT:  Abdominal pain and vomiting blood.   HISTORY OF PRESENT ILLNESS:  The patient is a 59 year old female with a past medical history of liver cirrhosis, hypertension, hypothyroidism and history of alcohol abuse was recently admitted to the hospital in June 2014 with chest pain and she had GI bleeding at that time which was assumed to be from esophageal varices and she had EGD done during that hospital stay and had banding done.  The patient was in her usual state of health until yesterday p.m.  She admits to drinking alcohol almost every day still.  Yesterday evening at around 5:00 p.m. she started developing lower abdominal pain.  Eventually, she started vomiting blood.  She vomited blood 2 times so far.  The patient was concerned and her sister brought her into the ER.  The patient's BUN is at 30, creatinine is at 1.56 and hemoglobin is at 7.6 with crit of 23.2.  The patient is started on Protonix GTT.  Typing and cross-matching is done for 2 units and ordered for blood transfusion.  Serum alcohol level is at 9.  The patient admits that her last drink of alcohol was yesterday.  Hospitalist team is called to admit the patient.  During my examination, the patient is complaining of lower abdominal pain.  Denies any fever.  No dizziness or loss of consciousness.  Denies any chest pain or shortness of breath.  Denies any black spots or floaters in her eyes.   PAST MEDICAL HISTORY:  Liver cirrhosis, hypothyroidism, hypertension, alcohol abuse, asthma, anemia from GI bleeding.   PAST SURGICAL HISTORY:  Tubal ligation, recent history of EGD in June 2014 and esophageal varices banding was done.   ALLERGIES:  THE PATIENT IS ALLERGIC TO DARVOCET AND PERCOCET, TAMIFLU, TYLENOL, VICODIN.   PSYCHOSOCIAL HISTORY:  Lives at home.  Denies any history of  smoking, still drinking alcohol and her last drink was yesterday.  Denies any illicit drug usage.   FAMILY HISTORY:  Father died of intracranial bleeding.  Mother died from myocardial infarction.  Other family members have hypertension and diabetes mellitus.   HOME MEDICATIONS:  Spironolactone 50 mg by mouth once daily, omeprazole 40 mg by mouth 2 times a day, levothyroxine 75 mcg by mouth once daily, clonidine 0.1 mg 1 tablet by mouth once daily, amlodipine 5 mg by mouth once daily.    REVIEW OF SYSTEMS: CONSTITUTIONAL:  Denies any fever or fatigue.  Complaining of weakness.  EYES:  Denies blurry vision, double vision.  EARS, NOSE, THROAT:  Denies epistaxis, discharge.  RESPIRATION:  Denies cough.  Has a chronic history of asthma.  CARDIOVASCULAR:  No chest pain, palpitations.  GASTROINTESTINAL:  The patient is vomiting blood, lower abdominal pain and cramping.  Denies any melena, but complained a couple episodes of diarrhea.  ENDOCRINE:  Denies polyuria, nocturia.  Has hypothyroidism.  HEMATOLOGIC AND LYMPHATIC:  No easy bruising.  Had anemia, vomiting blood.  INTEGUMENTARY:  No acne or rashes. MUSCULOSKELETAL:  No joint pain in the neck and back.  PSYCHIATRIC:  No ADD, OCD.   PHYSICAL EXAMINATION:  VITAL SIGNS:  Temperature 98 degrees Fahrenheit, pulse 88, respirations 18, blood pressure 108/72, pulse ox 99%.  GENERAL APPEARANCE:  Not under acute distress, moderately built and obese.  HEENT:  Normocephalic, atraumatic.  Pupils are equally reacting to  light and accommodation.  No scleral icterus.  No conjunctival injection.  No sinus tenderness.  No postnasal drip.  Dry mucous membranes.  NECK:  Supple.  No JVD.  No thyromegaly.  Range of motion is intact.  LUNGS:  Clear to auscultation bilaterally.  No accessory muscle usage.  No anterior chest wall tenderness on palpation.  CARDIAC:  S1, S2 normal.  Regular rate and rhythm.  No murmurs.  GASTROINTESTINAL:  Soft.  Bowel sounds are  positive, obese.  Lower abdominal tenderness is present.  No rebound tenderness.  No masses felt.    NEUROLOGIC:  Awake, alert, oriented x 3.  Motor and sensory grossly intact.  Reflexes are 2+.  EXTREMITIES:  Trace edema is present.  No cyanosis.  No clubbing.  SKIN:  Warm to touch.  Decreased turgor.  No rashes.  No lesions.  MUSCULOSKELETAL:  No joint effusion, tenderness, erythema.  PSYCHIATRIC:  Normal mood and affect.   LABORATORY AND IMAGING STUDIES:  Urinalysis is normal.  WBC 3.7, hemoglobin is 7.6, hematocrit 26.2, platelets 148.  LFTs:  Total protein is elevated at 8.6, AST at 136.  Chem-8, glucose 88, BUN 30, creatinine 1.56, sodium 141, potassium 3.6, chloride 101, GFR 36, anion gap 13, serum osmolality 287, calcium 8.5, lipase 114, serum alcohol level is 9.  A 12-lead EKG has revealed normal sinus rhythm at 84 beats per minute, normal PR and QRS interval.  No acute ST-T wave changes.   ASSESSMENT AND PLAN:  A 59 year old female with hematemesis and lower abdominal pain will be admitted with the following assessment and plan.  1.  Upper gastrointestinal bleed with lower abdominal cramping, probably from variceal bleed.  We will keep her nothing by mouth.  We will provide her IV fluids.  Proton pump inhibitor, GTT.  Type and crossmatch and transfuse 2 units of packed red blood cells.  Transsfuse each unit in four hours.  A gastroenterology consult is placed to Dr. Bluford Kaufmannh who is on call.   We will check labs.  We will check hemoglobin and hematocrit q. 6 hours.  2.  Chronic history of liver cirrhosis, probably alcohol related.  The patient still continues to drink alcohol.  3.  Acute renal insufficiency.  We will provide her IV fluids.  4.  Hypothyroidism.  The patient is currently nothing by mouth.  We will resume her home medication once she is on diet.  5.  Hypertension.  Blood pressure is on the low-normal.  We will continue close monitoring and up-titrate medications as needed basis.   6.  Gastrointestinal prophylaxis with Protonix drip and deep vein thrombosis prophylaxis with SCDs.   The diagnosis and plan of care was discussed in detail with the patient and her sister at bedside.  They both verbalized understanding of the plan.   Total critical care time spent is 50 minutes.  Condition is critical.     ____________________________ Ramonita LabAruna Cadarius Nevares, MD ag:ea D: 12/16/2013 05:41:44 ET T: 12/16/2013 06:01:35 ET JOB#: [id number]  cc: Ramonita LabAruna Cherron Blitzer, MD, <Dictator> Leanna SatoLinda M. Miles, MD Ramonita LabARUNA Bill Yohn MD ELECTRONICALLY SIGNED 12/31/2013 2:03

## 2015-03-16 NOTE — Consult Note (Signed)
PATIENT NAME:  Kathryn [name], [name] MR#:  [id number] DATE OF BIRTH:  May 16, 1956  DATE OF CONSULTATION:  09/25/2014  REFERRING PHYSICIAN:  Herschell Dimesichard J. Renae GlossWieting, MD  CONSULTING PHYSICIAN:  Dow AdolphMatthew Pennie Vanblarcom, MD  REASON FOR CONSULTATION:  Coffee-ground emesis.   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 Kathryn [name] reports she was in her usual state of health until this morning, when she started coughing for no apparent reason. After she started coughing, she then reports that she developed some vomiting. The vomit, she reports to me, right from the start was very dark in color. She does not report eating anything dark this morning prior to throwing up. Per the notes, it appears that they did mention that she threw up bile first, but to me she reports just throwing up very dark material straight from the start.   Of note, she did have an upper endoscopy in January 2015. This was also done for hematemesis. On that study, there were grade 1 varices. She also had severe portal hypertensive gastropathy in the entire examined stomach.   On presentation this time, she was noted to have an elevated alcohol level and also had an elevated lactic acid and decreased bicarbonate. This was thought to be possible ketoacidosis due to alcohol.   PAST MEDICAL HISTORY: 1.  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  2.  Asthma.  3.  Sarcoidosis.  4.  Anemia.  5.  Hypothyroidism.  6.  Hypertension.   SOCIAL HISTORY:  She continues to drink heavy alcohol. No smoking.   FAMILY HISTORY:  No family history of GI malignancy or liver disease that she is aware of.   REVIEW OF SYSTEMS:  A 10-system review is conducted. It is negative except as stated in the HPI.   PHYSICAL  EXAMINATION: VITAL SIGNS:  Temperature is 98.5, pulse is 88, respirations are 17, blood pressure is 195/98, and pulse oximetry is 100% on room air.  GENERAL:  Alert and oriented times 4.  No acute distress. Appears stated age. HEENT:  Normocephalic/atraumatic. Extraocular movements are intact. Anicteric. NECK:  Soft, supple. JVP appears normal. No adenopathy. CHEST:  Clear to auscultation. No wheeze or crackle. Respirations are unlabored. HEART:  Regular. Positive for 2/6 systolic murmur. No rub or gallop.  Normal S1 and S2. ABDOMEN:  Soft, nontender, nondistended.  Normal active bowel sounds in all four quadrants.  No organomegaly. No masses EXTREMITIES:  No swelling. Well perfused. SKIN:  No rash or lesion. Skin color, texture, and turgor are normal. NEUROLOGICAL:  Grossly intact. PSYCHIATRIC:  Normal tone and affect. MUSCULOSKELETAL:  No joint swelling or erythema.   LABORATORY DATA:  BUN is 24, creatinine 1.19, sodium 138, potassium 3.8. Her ethanol is 51. Beta-hydroxybutyrate is 33. Bicarbonate is 16. Liver enzymes:  Albumin 4, total bilirubin 1.7, alkaline phosphatase 201, AST 317, ALT 78. Her cardiac enzymes:  CK total is 296, CK-MB 4.7, troponin less than 0.02. Blood count:  White count 4.3, hemoglobin is 10.4, and her platelets are 209. Her INR is 1.1.   ASSESSMENT:  Coffee-ground emesis:  I do suspect that this is likely either from portal hypertensive gastropathy, alcohol gastritis, or retch-induced trauma. However, due to her history of grade 1 varices and also her ongoing alcohol use, it would be warranted to perform a repeat  upper endoscopy to make sure she did not bleed from her varices and that they do not require endoscopic intervention.   PLAN: 1.  Continue Protonix drip and octreotide drip for now.  2.  N.p.o. after midnight except for medications.  3.  Continue to monitor hemodynamics and hemoglobin closely.  4.  We will plan for esophagogastroduodenoscopy tentatively in the  a.m. if her labs do improve and look appropriate for anesthesia at that time. Will follow.   Thank you for this consult.    ____________________________ Dow Adolph, MD mr:nb D: 09/25/2014 19:37:18 ET T: 09/26/2014 00:12:09 ET JOB#: [id number]  cc: Dow Adolph, MD, <Dictator> Kathalene Frames MD ELECTRONICALLY SIGNED 10/16/2014 15:32

## 2015-03-16 NOTE — Consult Note (Signed)
Brief Consult Note: Diagnosis: coffee ground emeisis.   Patient was seen by consultant.   Consult note dictated.   Recommend to proceed with surgery or procedure.   Orders entered.   Comments: Plan for EGD in am if metabolic labs and ck-mb improved. (ck-mb very mildly elevated but so is CK so ratio intact).  Electronic Signatures: Dow Adolphein, Matthew (MD)  (Signed [phone]-Nov-15 19:39)  Authored: Brief Consult Note   Last Updated: 03-Nov-15 19:39 by Dow Adolphein, Matthew (MD)

## 2015-03-16 NOTE — Discharge Summary (Signed)
PATIENT NAME:  Kathryn [name], Kathryn [name] MR#:  [id number] DATE OF BIRTH:  [birth year]  DATE OF ADMISSION:  09/25/2014 DATE OF DISCHARGE:  09/27/2014  ADMITTING DIAGNOSIS: Hematemesis.   DISCHARGE DIAGNOSES:  1.  Hematemesis suspected due to portal hypertensive gastropathy, status post esophagogastroduodenoscopy showing grade 1 esophageal varices, severe erosive reflux esophagitis, also hypertensive gastropathy.  2.  Acute posthemorrhagic anemia not requiring transfusion.  3.  Hypoglycemia suspected due to liver cirrhosis.   4.  Suspected liver cirrhosis.   5.  Lactic metabolic acidosis likely due to malnutrition as well as alcohol abuse.  6.  Alcohol abuse dependence, ongoing.  7.  Pancytopenia.   DISCHARGE CONDITION: Stable.   DISCHARGE MEDICATIONS: The patient is to continue spironolactone 50 mg p.o. daily, nadolol 40 mg p.o. daily, iron sulfate 325 mg 3 times daily, clonidine 0.1 mg daily, hydralazine 50 mg 3 times daily, Colace 100 mg twice daily as needed, Prilosec 40 mg twice daily, levothyroxine 100 mcg p.o. daily, magnesium oxide 400 mg p.o. daily, thiamine 100 mg p.o. daily, and folic acid 1 mg p.o. daily.   HOME OXYGEN: None.   DIET: 2 grams salt, mechanical soft, advance to regular as tolerated.   FOLLOWUP APPOINTMENT: With [facility] in 2 days after discharge.  Also the patient was advised to have CBC checked by primary physician in the office in approximately 2-3 days after discharge to insure stability. Discharge hemoglobin level is 9.9 on 09/26/2014.    HOSPITAL COURSE:  The patient is a 59 year old with history of ongoing alcohol abuse, who presented to the hospital on 09/25/2014 with complaints of vomiting of blood. Please refer to Dr. Mathews Robinsons admission note on 09/25/2014. On arrival to the hospital the patient's vital signs, temperature was 98.1, respiration rate was 12, pulse was 91, blood pressure 164/85, standing pulse was 105 and blood pressure 132/79.  Physical exam was  unremarkable except for lower extremity swelling. The patient's laboratory data done on arrival to the hospital 09/25/2014 revealed an elevated BUN to 24, glucose level of 65. The patient's CO2 level was only 16. The patient's beta hydroxybutyrate level was 32.7 which was elevated. Liver enzymes revealed total protein of 9.8, total bilirubin was 1.7, alkaline phosphatase 201, AST was 317, and ALT was 78. CK total was mildly elevated at 296 and MB fraction was 4.7. Troponin was less than 0.02. White blood cell count was normal at 4.3, hemoglobin was 10.4, platelet count was 209,000. Coagulation panel was unremarkable with pro time 14.1, INR 1.1. Salicylate level was less than 1.7. ABGs were performed on 09/25/2014 showing pH of 7.23, pCO2 was 30, pO2 was 81, saturation was not measured. Lactic acid level was 8.2. Bicarbonate level was low at 12.6, base excess was -14. The patient's ethanol level was also checked due to acidosis and it was negative. Insulin level was low at less than 0.3. The patient was admitted to the hospital for further evaluation and she was started on D5 water because of her severe hypoglycemia, which was felt to be due to alcoholic ketoacidosis, malnutrition, as well as alcohol abuse, and possibly underlying liver cirrhosis. Alcohol level was found to be 51 on admission. The patient was treated and her hemoglobin level was followed while she was in the hospital. As mentioned above the patient's hemoglobin level was 10.4 on the day of admission 09/25/2014, it drifted down to as low as 8.6 on 09/26/2014, however then after stopping her IV fluids. Hemoglobin level improved to 9.9 on 09/26/2014. Platelet count also  decreased to 117,000 on 09/26/2014, her white blood cell count was also low at 2.6. It was felt that the patient's pancytopenia was very likely related to toxic alcohol effects to bone marrow. The patient was evaluated by gastroenterologist, Dr. Shelle Ironein and felt that the patient would  benefit from upper GI endoscopy. While in the hospital however she was managed on PPIs, Protonix IV as well as octreotide. Upper GI endoscopy was performed on 09/26/2014 by Dr. Shelle Ironein due to hematemesis, grade 1 varices were found at the gastroesophageal junction, mildly severe esophagitis was found at the gastroesophageal junction, moderate portal hypertensive  gastropathy was found in the gastric fundus.  The examined duodenum was normal.  Impression, suspect coffee-ground emesis was from portal hypertensive gastropathy.  The patient was reinitiated on the nadolol and her condition as time progressed improved. On the day of discharge she was able to eat a soft diet and had no hematemesis. Her vitals were stable with temperature of 98.1, pulse was 61, respiration rate was 18, blood pressure 122/76, saturation was 99% on room air at rest. The patient is to [facility] in the next few days after discharge and have CBC repeated to ensure stability.   TIME SPENT: 40 minutes.      ____________________________ Katharina Caperima Khalis Hittle, MD rv:bu D: 09/27/2014 16:52:47 ET T: 09/27/2014 20:46:43 ET JOB#: [id number]  cc: Katharina Caperima Raynell Scott, MD, <Dictator> Phineas Realharles [facility] Mohammad Granade MD ELECTRONICALLY SIGNED 10/21/2014 19:42

## 2015-04-04 ENCOUNTER — Emergency Department: Payer: Medicare Other

## 2015-04-04 ENCOUNTER — Encounter
Admission: EM | Disposition: A | Discharge status: Home / Self Care | Payer: Self-pay | Source: Home / Self Care | Attending: Specialist

## 2015-04-04 ENCOUNTER — Encounter: Payer: Self-pay | Admitting: Internal Medicine

## 2015-04-04 ENCOUNTER — Inpatient Hospital Stay: Payer: Medicare Other | Admitting: Anesthesiology

## 2015-04-04 ENCOUNTER — Inpatient Hospital Stay
Admission: EM | Admit: 2015-04-04 | Discharge: 2015-04-06 | DRG: 357 | Disposition: A | Discharge status: Home / Self Care | Payer: Medicare Other | Attending: Specialist | Admitting: Specialist

## 2015-04-04 DIAGNOSIS — J45909 Unspecified asthma, uncomplicated: Secondary | ICD-10-CM | POA: Diagnosis present

## 2015-04-04 DIAGNOSIS — K766 Portal hypertension: Secondary | ICD-10-CM | POA: Diagnosis present

## 2015-04-04 DIAGNOSIS — K7460 Unspecified cirrhosis of liver: Secondary | ICD-10-CM | POA: Diagnosis present

## 2015-04-04 DIAGNOSIS — K922 Gastrointestinal hemorrhage, unspecified: Secondary | ICD-10-CM

## 2015-04-04 DIAGNOSIS — F1020 Alcohol dependence, uncomplicated: Secondary | ICD-10-CM | POA: Diagnosis present

## 2015-04-04 DIAGNOSIS — K7030 Alcoholic cirrhosis of liver without ascites: Secondary | ICD-10-CM | POA: Diagnosis present

## 2015-04-04 DIAGNOSIS — D62 Acute posthemorrhagic anemia: Secondary | ICD-10-CM | POA: Diagnosis present

## 2015-04-04 DIAGNOSIS — I8501 Esophageal varices with bleeding: Secondary | ICD-10-CM | POA: Diagnosis present

## 2015-04-04 DIAGNOSIS — K920 Hematemesis: Secondary | ICD-10-CM | POA: Diagnosis present

## 2015-04-04 DIAGNOSIS — E039 Hypothyroidism, unspecified: Secondary | ICD-10-CM | POA: Diagnosis present

## 2015-04-04 DIAGNOSIS — D638 Anemia in other chronic diseases classified elsewhere: Secondary | ICD-10-CM | POA: Diagnosis present

## 2015-04-04 DIAGNOSIS — K209 Esophagitis, unspecified: Secondary | ICD-10-CM | POA: Diagnosis present

## 2015-04-04 DIAGNOSIS — I8500 Esophageal varices without bleeding: Secondary | ICD-10-CM | POA: Diagnosis present

## 2015-04-04 DIAGNOSIS — K210 Gastro-esophageal reflux disease with esophagitis: Secondary | ICD-10-CM | POA: Diagnosis present

## 2015-04-04 DIAGNOSIS — I10 Essential (primary) hypertension: Secondary | ICD-10-CM | POA: Diagnosis present

## 2015-04-04 DIAGNOSIS — R079 Chest pain, unspecified: Secondary | ICD-10-CM | POA: Diagnosis present

## 2015-04-04 HISTORY — DX: Essential (primary) hypertension: I10

## 2015-04-04 HISTORY — DX: Alcoholic cirrhosis of liver without ascites: K70.30

## 2015-04-04 HISTORY — DX: Hypothyroidism, unspecified: E03.9

## 2015-04-04 HISTORY — DX: Esophageal varices without bleeding: I85.00

## 2015-04-04 HISTORY — DX: Anemia in other chronic diseases classified elsewhere: D63.8

## 2015-04-04 HISTORY — DX: Sarcoidosis, unspecified: D86.9

## 2015-04-04 HISTORY — DX: Unspecified asthma, uncomplicated: J45.909

## 2015-04-04 HISTORY — DX: Portal hypertension: K76.6

## 2015-04-04 HISTORY — DX: Alcohol abuse, uncomplicated: F10.10

## 2015-04-04 HISTORY — PX: ESOPHAGOGASTRODUODENOSCOPY (EGD) WITH PROPOFOL: SHX5813

## 2015-04-04 LAB — TROPONIN I
Troponin I: <0.03 ng/mL (ref <0.031)
Troponin I: <0.03 ng/mL (ref <0.031)
Troponin I: <0.03 ng/mL (ref <0.031)

## 2015-04-04 LAB — COMPREHENSIVE METABOLIC PANEL
ALBUMIN: 3.5 g/dL (ref 3.5–5.0)
ALT: 79 U/L — ABNORMAL HIGH (ref 14–54)
AST: 824 U/L — AB (ref 15–41)
Alkaline Phosphatase: 187 U/L — ABNORMAL HIGH (ref 38–126)
Anion gap: 24 — ABNORMAL HIGH (ref 5–15)
BUN: 29 mg/dL — ABNORMAL HIGH (ref 6–20)
CHLORIDE: 101 mmol/L (ref 101–111)
CO2: 14 mmol/L — ABNORMAL LOW (ref 22–32)
Calcium: 8.5 mg/dL — ABNORMAL LOW (ref 8.9–10.3)
Creatinine, Ser: 1.53 mg/dL — ABNORMAL HIGH (ref 0.44–1.00)
GFR calc Af Amer: 42 mL/min — ABNORMAL LOW (ref >60)
GFR calc non Af Amer: 36 mL/min — ABNORMAL LOW (ref >60)
Glucose, Bld: 120 mg/dL — ABNORMAL HIGH (ref 65–99)
Potassium: 3.4 mmol/L — ABNORMAL LOW (ref 3.5–5.1)
Sodium: 139 mmol/L (ref 135–145)
TOTAL PROTEIN: 7.5 g/dL (ref 6.5–8.1)
Total Bilirubin: 1.3 mg/dL — ABNORMAL HIGH (ref 0.3–1.2)

## 2015-04-04 LAB — URINALYSIS COMPLETE WITH MICROSCOPIC (ARMC ONLY)
Bilirubin Urine: NEGATIVE
Glucose, UA: NEGATIVE mg/dL
Nitrite: NEGATIVE
Protein, ur: 100 mg/dL — AB
SPECIFIC GRAVITY, URINE: 1.02 (ref 1.005–1.030)
pH: 5 (ref 5.0–8.0)

## 2015-04-04 LAB — CBC
HEMATOCRIT: 26 % — AB (ref 35.0–47.0)
HEMOGLOBIN: 8.2 g/dL — AB (ref 12.0–16.0)
MCH: 28.4 pg (ref 26.0–34.0)
MCHC: 31.5 g/dL — ABNORMAL LOW (ref 32.0–36.0)
MCV: 90.2 fL (ref 80.0–100.0)
Platelets: 149 10*3/uL — ABNORMAL LOW (ref 150–440)
RBC: 2.89 MIL/uL — AB (ref 3.80–5.20)
RDW: 21.5 % — ABNORMAL HIGH (ref 11.5–14.5)
WBC: 3.4 10*3/uL — ABNORMAL LOW (ref 3.6–11.0)

## 2015-04-04 LAB — PROTIME-INR
INR: 1.36
PROTHROMBIN TIME: 17 s — AB (ref 11.4–15.0)

## 2015-04-04 LAB — TSH: TSH: 54.533 u[IU]/mL — AB (ref 0.350–4.500)

## 2015-04-04 LAB — CK TOTAL AND CKMB (NOT AT ARMC)
CK, MB: 1.7 ng/mL (ref 0.5–5.0)
Relative Index: 1.1 (ref 0.0–2.5)
Total CK: 153 U/L (ref 38–234)

## 2015-04-04 LAB — HEMOGLOBIN
HEMOGLOBIN: 6.6 g/dL — AB (ref 12.0–16.0)
Hemoglobin: 7.7 g/dL — ABNORMAL LOW (ref 12.0–16.0)
Hemoglobin: 7.1 g/dL — ABNORMAL LOW (ref 12.0–16.0)

## 2015-04-04 LAB — ETHANOL: ALCOHOL ETHYL (B): 81 mg/dL — AB (ref <5)

## 2015-04-04 SURGERY — ESOPHAGOGASTRODUODENOSCOPY (EGD) WITH PROPOFOL
Anesthesia: Monitor Anesthesia Care

## 2015-04-04 MED ORDER — TRAMADOL HCL 50 MG PO TABS
50.0000 mg | ORAL_TABLET | Freq: Once | ORAL | Status: AC
  Administered 2015-04-05: 50 mg via ORAL
  Filled 2015-04-04: qty 1

## 2015-04-04 MED ORDER — SODIUM CHLORIDE 0.9 % IV SOLN
INTRAVENOUS | Status: DC
  Administered 2015-04-04: 16:00:00 via INTRAVENOUS

## 2015-04-04 MED ORDER — DIPHENHYDRAMINE HCL 25 MG PO CAPS
25.0000 mg | ORAL_CAPSULE | Freq: Once | ORAL | Status: AC
  Administered 2015-04-05: 25 mg via ORAL
  Filled 2015-04-04: qty 1

## 2015-04-04 MED ORDER — SODIUM CHLORIDE 0.9 % IV SOLN
1000.0000 mL | Freq: Once | INTRAVENOUS | Status: AC
  Administered 2015-04-04: 1000 mL via INTRAVENOUS

## 2015-04-04 MED ORDER — SODIUM CHLORIDE 0.9 % IV SOLN
8.0000 mg/h | INTRAVENOUS | Status: DC
  Administered 2015-04-04 – 2015-04-05 (×3): 8 mg/h via INTRAVENOUS
  Filled 2015-04-04 (×3): qty 80

## 2015-04-04 MED ORDER — OCTREOTIDE ACETATE 500 MCG/ML IJ SOLN
12.5000 ug/h | INTRAMUSCULAR | Status: DC
  Administered 2015-04-04: 25 ug/h via INTRAVENOUS
  Administered 2015-04-05: 12.5 ug/h via INTRAVENOUS
  Filled 2015-04-04 (×2): qty 1

## 2015-04-04 MED ORDER — CLONIDINE HCL 0.1 MG PO TABS
0.1000 mg | ORAL_TABLET | Freq: Every day | ORAL | Status: DC
  Administered 2015-04-04 – 2015-04-05 (×2): 0.1 mg via ORAL
  Filled 2015-04-04 (×3): qty 1

## 2015-04-04 MED ORDER — SODIUM CHLORIDE 0.9 % IV SOLN
80.0000 mg | Freq: Once | INTRAVENOUS | Status: AC
  Administered 2015-04-04: 80 mg via INTRAVENOUS
  Filled 2015-04-04: qty 80

## 2015-04-04 MED ORDER — SODIUM CHLORIDE 0.9 % IV SOLN
1000.0000 mL | INTRAVENOUS | Status: DC
  Administered 2015-04-04: 1000 mL via INTRAVENOUS
  Administered 2015-04-04: 15:00:00 via INTRAVENOUS
  Administered 2015-04-04 – 2015-04-06 (×5): 1000 mL via INTRAVENOUS

## 2015-04-04 MED ORDER — SODIUM CHLORIDE 0.9 % IV SOLN: INTRAVENOUS | Status: DC

## 2015-04-04 MED ORDER — GLYCOPYRROLATE 0.2 MG/ML IJ SOLN
INTRAMUSCULAR | Status: DC | PRN
  Administered 2015-04-04: 0.2 mg via INTRAVENOUS

## 2015-04-04 MED ORDER — NADOLOL 20 MG PO TABS
80.0000 mg | ORAL_TABLET | Freq: Every day | ORAL | Status: DC
  Administered 2015-04-04 – 2015-04-05 (×2): 80 mg via ORAL
  Administered 2015-04-06: 40 mg via ORAL
  Filled 2015-04-04 (×3): qty 4

## 2015-04-04 MED ORDER — OCTREOTIDE LOAD VIA INFUSION
50.0000 ug | INTRAVENOUS | Status: DC
  Filled 2015-04-04 (×2): qty 25

## 2015-04-04 MED ORDER — PHENYLEPHRINE HCL 10 MG/ML IJ SOLN
INTRAMUSCULAR | Status: DC | PRN
  Administered 2015-04-04: 100 ug via INTRAVENOUS

## 2015-04-04 MED ORDER — PANTOPRAZOLE SODIUM 40 MG IV SOLR: 80.0000 mg | Freq: Once | INTRAVENOUS | Status: DC

## 2015-04-04 MED ORDER — SPIRONOLACTONE 25 MG PO TABS
25.0000 mg | ORAL_TABLET | Freq: Every day | ORAL | Status: DC
  Administered 2015-04-04 – 2015-04-06 (×3): 25 mg via ORAL
  Filled 2015-04-04 (×3): qty 1

## 2015-04-04 MED ORDER — LIDOCAINE HCL (CARDIAC) 20 MG/ML IV SOLN
INTRAVENOUS | Status: DC | PRN
  Administered 2015-04-04: 100 mg via INTRAVENOUS

## 2015-04-04 MED ORDER — PROPOFOL 10 MG/ML IV BOLUS
INTRAVENOUS | Status: DC | PRN
  Administered 2015-04-04: 60 mg via INTRAVENOUS

## 2015-04-04 MED ORDER — FENTANYL CITRATE (PF) 100 MCG/2ML IJ SOLN
INTRAMUSCULAR | Status: DC | PRN
  Administered 2015-04-04: 50 ug via INTRAVENOUS

## 2015-04-04 MED ORDER — PROPOFOL INFUSION 10 MG/ML OPTIME
INTRAVENOUS | Status: DC | PRN
  Administered 2015-04-04: 250 ug/kg/min via INTRAVENOUS

## 2015-04-04 MED ORDER — SODIUM CHLORIDE 0.9 % IJ SOLN
3.0000 mL | Freq: Two times a day (BID) | INTRAMUSCULAR | Status: DC
  Administered 2015-04-04 – 2015-04-06 (×3): 3 mL via INTRAVENOUS

## 2015-04-04 MED ORDER — PANTOPRAZOLE SODIUM 40 MG IV SOLR: 40.0000 mg | Freq: Two times a day (BID) | INTRAVENOUS | Status: DC

## 2015-04-04 MED ORDER — SODIUM CHLORIDE 0.9 % IV SOLN
Freq: Once | INTRAVENOUS | Status: AC
  Administered 2015-04-05: 02:00:00 via INTRAVENOUS

## 2015-04-04 MED ORDER — LEVOTHYROXINE SODIUM 150 MCG PO TABS
175.0000 ug | ORAL_TABLET | Freq: Every day | ORAL | Status: DC
  Administered 2015-04-04 – 2015-04-06 (×3): 175 ug via ORAL
  Filled 2015-04-04 (×3): qty 1

## 2015-04-04 NOTE — Op Note (Signed)
[facility] Gastroenterology Patient Name: Olegario MessierKathy Reser Procedure Date: 04/04/2015 3:13 PM MRN: [id number] Account #: [id number] Date of Birth: 06/28/1956 Admit Type: Inpatient Age: [age] [facility] 4 Gender: Female Note Status: Finalized Procedure:         Upper GI endoscopy Indications:       Hematemesis Providers:         Midge Miniumarren Julieann Drummonds, MD Referring MD:      Pollyann SamplesAdrian A. Mancheno Revelo (Referring MD) Medicines:         Propofol per Anesthesia Complications:     No immediate complications. Procedure:         Pre-Anesthesia Assessment:                    - Prior to the procedure, a History and Physical was                     performed, and patient medications and allergies were                     reviewed. The patient's tolerance of previous anesthesia                     was also reviewed. The risks and benefits of the procedure                     and the sedation options and risks were discussed with the                     patient. All questions were answered, and informed consent                     was obtained. Prior Anticoagulants: The patient has taken                     no previous anticoagulant or antiplatelet agents. ASA                     Grade Assessment: IV - A patient with severe systemic                     disease that is a constant threat to life. After reviewing                     the risks and benefits, the patient was deemed in                     satisfactory condition to undergo the procedure.                    After obtaining informed consent, the endoscope was passed                     under direct vision. Throughout the procedure, the                     patient's blood pressure, pulse, and oxygen saturations                     were monitored continuously. The Endoscope was introduced                     through the mouth, and advanced to the second part of  duodenum. The upper GI endoscopy was accomplished  without                     difficulty. Findings:      Grade II varices were found in the lower third of the esophagus. Three       bands were successfully placed with complete eradication, resulting in       deflation of varices. Bleeding had stopped at the end of the procedure.      Moderate portal hypertensive gastropathy was found in the entire       examined stomach.      Localized mild inflammation characterized by erythema was found in the       duodenal bulb. Impression:        - Grade II esophageal varices. Completely eradicated.                     Banded.                    - Portal hypertensive gastropathy.                    - Duodenitis.                    - No specimens collected. Recommendation:    - Administer an IV bolus of 50 micrograms of octreotide                     followed by an infusion of 50 micrograms per hour. Procedure Code(s): --- Professional ---                    [phone], Esophagogastroduodenoscopy, flexible, transoral;                     with band ligation of esophageal/gastric varices Diagnosis Code(s): --- Professional ---                    K92.0, Hematemesis                    I85.00, Esophageal varices without bleeding                    K76.6, Portal hypertension                    K31.89, Other diseases of stomach and duodenum                    K29.80, Duodenitis without bleeding CPT copyright 2014 American Medical Association. All rights reserved. The codes documented in this report are preliminary and upon coder review may  be revised to meet current compliance requirements. Midge Miniumarren Willard Madrigal, MD 04/04/2015 3:38:15 PM This report has been signed electronically. Number of Addenda: 0 Note Initiated On: 04/04/2015 3:13 PM      [facility]

## 2015-04-04 NOTE — ED Provider Notes (Signed)
[facility] Emergency Department Provider Note  ____________________________________________  Time seen: 12:50 AM  I have reviewed the triage vital signs and the nursing notes.   HISTORY  Chief Complaint Chest Pain and Hematemesis      HPI Kathryn [name] is a 59 y.o. female presents with hematemesis and central chest pain described as sharp 8 out of 10. Of note patient has a history of esophageal varices with previous episodes of hematemesis. In addition patient admits to alcohol consumption last night. Patient admits to 2 episodes of gross hematemesis with onset at 8 PM last night. Patient denies any blood in her stool.     No past medical history on file.  There are no active problems to display for this patient.   No past surgical history on file.  No current outpatient prescriptions on file.  Allergies Review of patient's allergies indicates not on file.  No family history on file.  Social History History  Substance Use Topics  . Smoking status: Not on file  . Smokeless tobacco: Not on file  . Alcohol Use: Not on file    Review of Systems  Constitutional: Negative for fever. Eyes: Negative for visual changes. ENT: Negative for sore throat. Cardiovascular: Negative for chest pain. Respiratory: Negative for shortness of breath. Gastrointestinal: Negative for abdominal pain, bloody vomiting  Genitourinary: Negative for dysuria. Musculoskeletal: Negative for back pain. Skin: Negative for rash. Neurological: Negative for headaches, focal weakness or numbness.   10-point ROS otherwise negative.  ____________________________________________   PHYSICAL EXAM:  VITAL SIGNS: ED Triage Vitals  Enc Vitals Group     BP 04/04/15 0039 94/54 mmHg     Pulse Rate 04/04/15 0039 95     Resp --      Temp 04/04/15 0039 97.9 F (36.6 C)     Temp src --      SpO2 04/04/15 0039 98 %     Weight 04/04/15 0039 204 lb (92.534 kg)     Height  04/04/15 0039 5\' 4"  (1.626 m)     Head Cir --      Peak Flow --      Pain Score 04/04/15 0039 5     Pain Loc --      Pain Edu? --      Excl. in GC? --      Constitutional: Alert and oriented. Well appearing and in no distress. Eyes: Conjunctivae are normal. PERRL. Normal extraocular movements. ENT   Head: Normocephalic and atraumatic.   Nose: No congestion/rhinnorhea.   Mouth/Throat: Mucous membranes are moist. Dried blood noted on oral mucosa.   Neck: No stridor. Cardiovascular: Normal rate, regular rhythm. Normal and symmetric distal pulses are present in all extremities. Grade 3 systolic ejection murmur noted, rubs, or gallops. Respiratory: Normal respiratory effort without tachypnea nor retractions. Breath sounds are clear and equal bilaterally. No wheezes/rales/rhonchi. Gastrointestinal: Soft and nontender. Epigastric discomfort with palpation. No distention. There is no CVA tenderness. Genitourinary: deferred Musculoskeletal: Nontender with normal range of motion in all extremities. No joint effusions.  No lower extremity tenderness nor edema. Neurologic:  Normal speech and language. No gross focal neurologic deficits are appreciated. Speech is normal.  Skin:  Skin is warm, dry and intact. No rash noted. Psychiatric: Mood and affect are normal. Speech and behavior are normal. Patient exhibits appropriate insight and judgment.  ____________________________________________    LABS (pertinent positives/negatives)  Labs Reviewed  CBC - Abnormal; Notable for the following:    WBC 3.4 (*)  RBC 2.89 (*)    Hemoglobin 8.2 (*)    HCT 26.0 (*)    MCHC 31.5 (*)    RDW 21.5 (*)    Platelets 149 (*)    All other components within normal limits  COMPREHENSIVE METABOLIC PANEL - Abnormal; Notable for the following:    Potassium 3.4 (*)    CO2 14 (*)    Glucose, Bld 120 (*)    BUN 29 (*)    Creatinine, Ser 1.53 (*)    Calcium 8.5 (*)    AST 824 (*)    ALT 79 (*)     Alkaline Phosphatase 187 (*)    Total Bilirubin 1.3 (*)    GFR calc non Af Amer 36 (*)    GFR calc Af Amer 42 (*)    Anion gap 24 (*)    All other components within normal limits  URINALYSIS COMPLETEWITH MICROSCOPIC ([facility])  - Abnormal; Notable for the following:    Color, Urine AMBER (*)    APPearance CLOUDY (*)    Ketones, ur 1+ (*)    Hgb urine dipstick 1+ (*)    Protein, ur 100 (*)    Leukocytes, UA 1+ (*)    Bacteria, UA RARE (*)    Squamous Epithelial / LPF 6-30 (*)    All other components within normal limits  TROPONIN I  CK TOTAL AND CKMB  TROPONIN I  CBC  LIPASE, BLOOD  COMPREHENSIVE METABOLIC PANEL  ETHANOL     ____________________________________________   EKG   Date: 04/04/2015  Rate: 101  Rhythm: Sinus tachycardia rhythm  QRS Axis: normal  Intervals: normal  ST/T Wave abnormalities: normal  Conduction Disutrbances: none  Narrative Interpretation: unremarkable       ____________________________________________        ____________________________________________   INITIAL IMPRESSION / ASSESSMENT AND PLAN / ED COURSE  Pertinent labs & imaging results that were available during my care of the patient were reviewed by me and considered in my medical decision making (see chart for details).   History physical exam consistent with variceal bleed as such patient receive IV Protonix 80 mg bolus with drip to follow. Patient will be admitted to the hospitalist for further therapy  ____________________________________________   FINAL CLINICAL IMPRESSION(S) / ED DIAGNOSES  Final diagnoses:  Acute upper GI bleed      Darci Currentandolph N Brown, MD 04/04/15 [phone]

## 2015-04-04 NOTE — Transfer of Care (Signed)
Immediate Anesthesia Transfer of Care Note  Patient: Kathryn [name]  Procedure(s) Performed: Procedure(s) with comments: ESOPHAGOGASTRODUODENOSCOPY (EGD) WITH PROPOFOL (N/A) - Dr Servando SnareWOHL prefers [phone]  Patient Location: Endoscopy Unit  Anesthesia Type:MAC  Level of Consciousness: awake, alert  and oriented  Airway & Oxygen Therapy: Patient Spontanous Breathing and Patient connected to nasal cannula oxygen  Post-op Assessment: Report given to RN and Post -op Vital signs reviewed and stable  Post vital signs: stable  Last Vitals:  Filed Vitals:   04/04/15 1544  BP: 125/79  Pulse: 76  Temp: 37.9 C  Resp: 12    Complications: No apparent anesthesia complications

## 2015-04-04 NOTE — ED Notes (Signed)
MD at bedside.  Dr Anne HahnWillis.

## 2015-04-04 NOTE — Anesthesia Procedure Notes (Signed)
Performed by: Irving BurtonBACHICH, Bryce Kimble Pre-anesthesia Checklist: Suction available, Emergency Drugs available, Patient identified and Patient being monitored Patient Re-evaluated:Patient Re-evaluated prior to inductionOxygen Delivery Method: Nasal cannula Preoxygenation: Pre-oxygenation with 100% oxygen Intubation Type: IV induction

## 2015-04-04 NOTE — Interval H&P Note (Signed)
History and Physical Interval Note:  04/04/2015 2:51 PM  Kathryn ArchKathy B [name]  has presented today for surgery, with the diagnosis of hematemesis  The various methods of treatment have been discussed with the patient and family. After consideration of risks, benefits and other options for treatment, the patient has consented to  Procedure(s) with comments: ESOPHAGOGASTRODUODENOSCOPY (EGD) WITH PROPOFOL (N/A) - Dr Servando SnareWOHL prefers [phone] as a surgical intervention .  The patient's history has been reviewed, patient examined, no change in status, stable for surgery.  I have reviewed the patient's chart and labs.  Questions were answered to the patient's satisfaction.     [facility],Tata Timmins

## 2015-04-04 NOTE — ED Notes (Addendum)
Pt to triage via w/c, brought in by EMS; pt reports vomiting and coughing blood tonight; st hx of same with esophageal surgery with hx varices (st drank ETOH yesterday); denies abd pain; st some mid CP, nonradiating; pt diaphoretic/restless

## 2015-04-04 NOTE — Anesthesia Postprocedure Evaluation (Signed)
  Anesthesia Post-op Note  Patient: Kathryn [name]  Procedure(s) Performed: Procedure(s) with comments: ESOPHAGOGASTRODUODENOSCOPY (EGD) WITH PROPOFOL (N/A) - Dr Servando SnareWOHL prefers 1445  Anesthesia type:MAC  Patient location: PACU  Post pain: Pain level controlled  Post assessment: Post-op Vital signs reviewed, Patient's Cardiovascular Status Stable, Respiratory Function Stable, Patent Airway and No signs of Nausea or vomiting  Post vital signs: Reviewed and stable  Last Vitals:  Filed Vitals:   04/04/15 1600  BP: 142/85  Pulse:   Temp:   Resp:     Level of consciousness: awake, alert  and patient cooperative  Complications: No apparent anesthesia complications

## 2015-04-04 NOTE — Progress Notes (Signed)
[facility] - [place] at [facility]   PATIENT NAME: Kathryn IvanoffKathy [name]    MR#:  [id number]  DATE OF BIRTH:  03/21/1956  SUBJECTIVE:  CHIEF COMPLAINT:   Chief Complaint  Patient presents with  . Chest Pain  . Hematemesis   Pt. Here due to hematemesis.  No further episodes overnight or this a.m. And Hg. Stable.  Going for endoscopy later today.    REVIEW OF SYSTEMS:    Review of Systems  Constitutional: Negative for fever and chills.  HENT: Negative for congestion and tinnitus.   Eyes: Negative for blurred vision and double vision.  Respiratory: Negative for cough, shortness of breath and wheezing.   Cardiovascular: Negative for chest pain, orthopnea and PND.  Gastrointestinal: Negative for nausea, vomiting, abdominal pain, diarrhea, blood in stool and melena.  Genitourinary: Negative for dysuria and hematuria.  Neurological: Negative for dizziness, sensory change and focal weakness.  All other systems reviewed and are negative.   Nutrition: NPO for now.  Tolerating Diet: Awaiting Endoscopy and will initiate diet.    DRUG ALLERGIES:   Allergies  Allergen Reactions  . Hydrocodone-Acetaminophen Other (See Comments)    Reaction: pt can't take anything with tylenol due to her liver disease.   . Ibuprofen Other (See Comments)    Esophageal varices  . Naproxen Other (See Comments)    Esophageal varices  . Propoxyphene Other (See Comments)    Reaction: pt isn't sure, but knows she can't take it.   . Tamiflu  [Oseltamivir Phosphate] Other (See Comments)    Esophageal varices    VITALS:  Blood pressure 132/62, pulse 95, temperature 98.1 F (36.7 C), temperature source Oral, resp. rate 18, height 5\' 4"  (1.626 m), weight 87.998 kg (194 lb), SpO2 100 %.  PHYSICAL EXAMINATION:   Physical Exam  GENERAL:  59 y.o.-year-old patient lying in the bed with no acute distress.  EYES: Pupils equal, round, reactive to light and accommodation. No scleral icterus.  Extraocular muscles intact.  HEENT: Head atraumatic, normocephalic. Oropharynx and nasopharynx clear.  NECK:  Supple, no jugular venous distention. No thyroid enlargement, no tenderness.  LUNGS: Normal breath sounds bilaterally, no wheezing, rales, rhonchi. No use of accessory muscles of respiration.  CARDIOVASCULAR: S1, S2 normal. No murmurs, rubs, or gallops.  ABDOMEN: Soft, nontender, nondistended. Bowel sounds present. No organomegaly or mass.  EXTREMITIES: No cyanosis, clubbing or edema b/l.    NEUROLOGIC: Cranial nerves II through XII are intact. No focal Motor or sensory deficits b/l.   PSYCHIATRIC: The patient is alert and oriented x 3.  SKIN: No obvious rash, lesion, or ulcer.    LABORATORY PANEL:   CBC  Recent Labs Lab 04/04/15 0116 04/04/15 0627  WBC 3.4*  --   HGB 8.2* 7.7*  HCT 26.0*  --   PLT 149*  --    ------------------------------------------------------------------------------------------------------------------  Chemistries   Recent Labs Lab 04/04/15 0116  NA 139  K 3.4*  CL 101  CO2 14*  GLUCOSE 120*  BUN 29*  CREATININE 1.53*  CALCIUM 8.5*  AST 824*  ALT 79*  ALKPHOS 187*  BILITOT 1.3*   ------------------------------------------------------------------------------------------------------------------  Cardiac Enzymes  Recent Labs Lab 04/04/15 0626  TROPONINI <0.03   ------------------------------------------------------------------------------------------------------------------  RADIOLOGY:  Dg Chest 2 View  04/04/2015   CLINICAL DATA:  Vomiting blood for 2 hours. Weakness and sweats. 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  EXAM: CHEST  2 VIEW  COMPARISON:  02/01/2015  FINDINGS: The heart size and  mediastinal contours are within normal limits. Both lungs are clear. The visualized skeletal structures are unremarkable.  IMPRESSION: No active cardiopulmonary disease.   Electronically Signed   By: Burman NievesWilliam   Stevens M.D.   On: 04/04/2015 01:43     ASSESSMENT AND PLAN:   59 yo female w/ hx of Liver Cirrhosis, esophogeal varices, Hypothyroidism, ASthma, Sarcoidosis, HTN, came into hospital due to hematemesis.    1. GI bleed - likely upper GI bleed given Hematemesis.  - pt. Has a hx of esophogeal varices.  Hg. Stable and no acute bleeding overnight.  - Had endoscopy in 09/2014 showing grade 1 esophogeal varices.  - cont. Protonix gtt. Cont. Nadolol. Keep NPO for now.  - seen by GI and plan to do endoscopy later today.   2. Hypothyroidism - cont. Synthroid.   3. HTN - hold BP meds due to hypotension, GI bleed.   4. Anemia - acute blood loss anemia due to GI bleed.  - will follow Hg. And transfuse if Hg. < 7 or actively starts bleeding again.    All the records are reviewed and case discussed with Care Management/Social Workerr. Management plans discussed with the patient, family and they are in agreement.  CODE STATUS: Full  DVT Prophylaxis: TED/SCD  TOTAL TIME TAKING CARE OF THIS PATIENT: 30 minutes.   POSSIBLE D/C IN 1-2 DAYS. Await endoscopy results.     Houston SirenSAINANI,VIVEK J M.D on 04/04/2015 at 11:13 AM  Between 7am to 6pm - Pager - [phone]  After 6pm go to [email or web address] - password [email or web address] [facility]  [facility]  Office  [phone]  CC: Primary care physician; No PCP Per Patient

## 2015-04-04 NOTE — Progress Notes (Signed)
Judeth CornfieldStephanie supervisor notified of need for telemetry monitor.

## 2015-04-04 NOTE — Progress Notes (Signed)
Initial Nutrition Assessment  DOCUMENTATION CODES:     INTERVENTION:  Medical Food Supplement Therapy: Pt may benefit from supplement once diet order able to be advanced  NUTRITION DIAGNOSIS:  Inadequate oral intake related to inability to eat as evidenced by NPO status.  GOAL:  Patient will meet greater than or equal to 90% of their needs  MONITOR:  Energy Intake Electrolyte and Renal Profile Hepatic Profile Digestive System   REASON FOR ASSESSMENT:  Malnutrition Screening Tool    ASSESSMENT:  Pt admitted with recurrent hematemesis. Per MD note, pt continues to drink EtOH daily with known alcholic cirrhosis and esophageal varices. Pt scheduled for EGD today. PMHx: sarcoidosis, hypothyroidism, chronic disease anemia, HTN, esophageal varices, HTN, Alcoholiccirrhosis of liver, EtOH abuse  PO Intake: pt out of room on visit, unable to clarify past po intake. Pt currently NPO.  Medications: Protonix, NS at 3520mL/hr Labs: Electrolyte and Renal Profile:    Recent Labs Lab 04/04/15 0116  BUN 29*  CREATININE 1.53*  NA 139  K 3.4*   Glucose Profile: No results for input(s): GLUCAP in the last 72 hours. Protein Profile:   Recent Labs Lab 04/04/15 0116  ALBUMIN 3.5  Hepatic Profile: ALP 187, AST 824, ALT 79 Anemia Profile: Hgb 7.7  Height:  Ht Readings from Last 1 Encounters:  04/04/15 5\' 4"  (1.626 m)    Weight:  Wt Readings from Last 1 Encounters:  04/04/15 194 lb (87.998 kg)   Weight 09/26/2014 203.7lbs (5% weight loss in 6 months)  Wt Readings from Last 10 Encounters:  04/04/15 194 lb (87.998 kg)   Unable to complete Nutrition-Focused physical exam at this time.    BMI:  Body mass index is 33.28 kg/(m^2).  Estimated Nutritional Needs:  Kcal:  1588-1877kcals, BEE: 1111kcals, TEE: (IF 1.1-1.3)(AF 1.3) using IBW of 54.6kg  Protein:  55-65g protein (1.0-1.2g/kg) using IBW of 54.6kg  Fluid:  1365-162638mL of fluid (25-2230mL/kg) using IBW of  54.6kg  Skin:  Reviewed, no issues  Diet Order:  Diet NPO time specified Except for: Sips with Meds  EDUCATION NEEDS:  No education needs identified at this time   Intake/Output Summary (Last 24 hours) at 04/04/15 1547 Last data filed at 04/04/15 1303  Gross per 24 hour  Intake 2107.3 ml  Output    450 ml  Net 1657.3 ml    Last BM:  5/9  MODERATE Care Level  Leda QuailAllyson Tanisa Lagace, RD, LDN Pager [phone]

## 2015-04-04 NOTE — Anesthesia Preprocedure Evaluation (Signed)
Anesthesia Evaluation  Patient identified by MRN, date of birth, ID band Patient awake    Reviewed: Allergy & Precautions, NPO status , Patient's Chart, lab work & pertinent test results, reviewed documented beta blocker date and time   Airway Mallampati: III  TM Distance: >3 FB Neck ROM: Full    Dental  (+) Upper Dentures, Lower Dentures   Pulmonary asthma ,  breath sounds clear to auscultation  Pulmonary exam normal       Cardiovascular Exercise Tolerance: Poor hypertension, Pt. on medications Normal cardiovascular examRhythm:Regular Rate:Normal     Neuro/Psych negative neurological ROS  negative psych ROS   GI/Hepatic negative GI ROS, (+) Cirrhosis -  Esophageal Varices    ,   Endo/Other  Hypothyroidism   Renal/GU negative Renal ROS  negative genitourinary   Musculoskeletal negative musculoskeletal ROS (+)   Abdominal   Peds negative pediatric ROS (+)  Hematology  (+) anemia ,   Anesthesia Other Findings   Reproductive/Obstetrics negative OB ROS                             Anesthesia Physical Anesthesia Plan  ASA: IV  Anesthesia Plan: MAC   Post-op Pain Management:    Induction: Intravenous  Airway Management Planned: Nasal Cannula  Additional Equipment:   Intra-op Plan:   Post-operative Plan:   Informed Consent: I have reviewed the patients History and Physical, chart, labs and discussed the procedure including the risks, benefits and alternatives for the proposed anesthesia with the patient or authorized representative who has indicated his/her understanding and acceptance.     Plan Discussed with: CRNA and Surgeon  Anesthesia Plan Comments:         Anesthesia Quick Evaluation

## 2015-04-04 NOTE — H&P (Signed)
[facility] - [place] at [facility]   PATIENT NAME: Kathryn [name]    MR#:  [id number]  DATE OF BIRTH:  05-12-1956  DATE OF ADMISSION:  04/04/2015  PRIMARY CARE PHYSICIAN: No PCP Per Patient   REQUESTING/REFERRING PHYSICIAN: Manson Passey  CHIEF COMPLAINT:   Chief Complaint  Patient presents with  . Chest Pain  . Hematemesis    HISTORY OF PRESENT ILLNESS:  Kathryn [name]  is a 59 y.o. female who presents with acute episode of hematemesis.  She states that this began around 4 pm the evening prior to admission.  She has had this problem before, and has a known history of cirrhosis with portal hypertension and esophageal varices. She has required banding in the past. She is unsure what may triggered it this time. She does state that she has been having increased esophageal reflux recently. She developed mild abdominal pain after she began having hematemesis. She also had some mild chest pains along with these episodes of vomiting. She denies any fever or chills, cough or other infectious symptoms. In the ED she was found to be anemic. She has chronic anemia at baseline, but her hemoglobin usually runs about 9-10 in the ED was found to be 8.2. She was given IV Protonix and started on Protonix drip, and hospitalists were called for admission.  PAST MEDICAL HISTORY:   Past Medical History  Diagnosis Date  . Asthma   . Sarcoidosis   . Hypothyroidism   . Anemia   . Hypertension   . Esophageal varices   . Portal hypertension   . Cirrhosis     PAST SURGICAL HISTORY:   Past Surgical History  Procedure Laterality Date  . Tubal ligation    . Esophagogastroduodenoscopy w/ banding      SOCIAL HISTORY:   History  Substance Use Topics  . Smoking status: Never Smoker   . Smokeless tobacco: Not on file  . Alcohol Use: 0.0 oz/week    0 Standard drinks or equivalent per week    FAMILY HISTORY:   Family History  Problem Relation Age of Onset  . Aneurysm Father   . Heart  attack Mother     DRUG ALLERGIES:   Allergies  Allergen Reactions  . Hydrocodone-Acetaminophen Other (See Comments)    Reaction: pt can't take anything with tylenol due to her liver disease.   . Ibuprofen Other (See Comments)    Esophageal varices  . Naproxen Other (See Comments)    Esophageal varices  . Propoxyphene Other (See Comments)    Reaction: pt isn't sure, but knows she can't take it.   . Tamiflu  [Oseltamivir Phosphate] Other (See Comments)    Esophageal varices    MEDICATIONS AT HOME:   Prior to Admission medications   Medication Sig Start Date End Date Taking? Authorizing Provider  cloNIDine (CATAPRES) 0.1 MG tablet Take 1 tablet by mouth at bedtime.    Historical Provider, MD  levothyroxine (SYNTHROID, LEVOTHROID) 175 MCG tablet Take by mouth.    Historical Provider, MD  nadolol (CORGARD) 80 MG tablet Take by mouth.    Historical Provider, MD  omeprazole (PRILOSEC) 20 MG capsule Take 1 capsule by mouth daily.    Historical Provider, MD  spironolactone (ALDACTONE) 25 MG tablet Take 1 tablet by mouth daily.    Historical Provider, MD    REVIEW OF SYSTEMS:  Review of Systems  Constitutional: Negative for fever, chills, weight loss and malaise/fatigue.  HENT: Negative for ear pain, hearing loss and tinnitus.  Eyes: Negative for blurred vision, double vision, pain and redness.  Respiratory: Negative for cough, hemoptysis and shortness of breath.   Cardiovascular: Negative for chest pain, palpitations, orthopnea and leg swelling.  Gastrointestinal: Positive for nausea, vomiting ( Hematemesis) and abdominal pain. Negative for diarrhea and constipation.  Genitourinary: Negative for dysuria, frequency and hematuria.  Skin:       No acne, rash, or lesions  Neurological: Negative for dizziness, tremors, focal weakness and weakness.  Endo/Heme/Allergies: Negative for polydipsia. Does not bruise/bleed easily.  Psychiatric/Behavioral: Negative for depression. The patient is  not nervous/anxious and does not have insomnia.      VITAL SIGNS:   Filed Vitals:   04/04/15 0128 04/04/15 0157 04/04/15 0330 04/04/15 0348  BP: 90/62 120/66 146/79   Pulse: 79 82 81 89  Temp:      Resp: 20 14 18 19   Height:      Weight:      SpO2: 99%  100% 100%   Wt Readings from Last 3 Encounters:  04/04/15 92.534 kg (204 lb)    PHYSICAL EXAMINATION:  Physical Exam  Constitutional: She is oriented to person, place, and time. She appears well-developed and well-nourished. No distress.  HENT:  Head: Normocephalic and atraumatic.  Mouth/Throat: Oropharynx is clear and moist.  Eyes: Conjunctivae and EOM are normal. Pupils are equal, round, and reactive to light. No scleral icterus.  Neck: Normal range of motion. Neck supple. No JVD present. No thyromegaly present.  Cardiovascular: Normal rate, regular rhythm and intact distal pulses.  Exam reveals no gallop and no friction rub.   Murmur (II/VI soft systolic) heard. Respiratory: Effort normal and breath sounds normal. No respiratory distress. She has no wheezes. She has no rales.  GI: Soft. Bowel sounds are normal. She exhibits no distension. There is tenderness (mild).  Musculoskeletal: Normal range of motion. She exhibits no edema.  No arthritis, no gout  Lymphadenopathy:    She has no cervical adenopathy.  Neurological: She is alert and oriented to person, place, and time. No cranial nerve deficit.  No dysarthria, no aphasia  Skin: Skin is warm and dry. No rash noted. No erythema.  Psychiatric: She has a normal mood and affect. Her behavior is normal. Judgment and thought content normal.    LABORATORY PANEL:   CBC  Recent Labs Lab 04/04/15 0116  WBC 3.4*  HGB 8.2*  HCT 26.0*  PLT 149*   ------------------------------------------------------------------------------------------------------------------  Chemistries   Recent Labs Lab 04/04/15 0116  NA 139  K 3.4*  CL 101  CO2 14*  GLUCOSE 120*  BUN 29*   CREATININE 1.53*  CALCIUM 8.5*  AST 824*  ALT 79*  ALKPHOS 187*  BILITOT 1.3*   ------------------------------------------------------------------------------------------------------------------  Cardiac Enzymes  Recent Labs Lab 04/04/15 0116  TROPONINI <0.03   ------------------------------------------------------------------------------------------------------------------  RADIOLOGY:  Dg Chest 2 View  04/04/2015   CLINICAL DATA:  Vomiting blood for 2 hours. Weakness and sweats. 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  EXAM: CHEST  2 VIEW  COMPARISON:  02/01/2015  FINDINGS: The heart size and mediastinal contours are within normal limits. Both lungs are clear. The visualized skeletal structures are unremarkable.  IMPRESSION: No active cardiopulmonary disease.   Electronically Signed   By: Burman NievesWilliam  Stevens M.D.   On: 04/04/2015 01:43    EKG:   Orders placed or performed during the hospital encounter of 04/04/15  . ED EKG (<6010mins upon arrival to the ED)  . ED EKG (<4410mins upon arrival to  the ED)  . EKG 12-Lead  . EKG 12-Lead    IMPRESSION AND PLAN:  Principal Problem:   Hematemesis - is a recurrent problem for this patient. Protonix drip started, if she begins to have more hematemesis we may need to use Sandostatin as well. Will get a GI consult, monitor her hemoglobin every 6 hours and transfuse if she falls below 7. She'll be nothing by mouth for now. Active Problems:   Esophageal varices - as a result of her cirrhosis and portal hypertension, and the likely cause of her hematemesis at this time. Treatment for this as above.   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 Defer to GI recommendations for management of this problem.   Hypertension - chronic stable problem, continue home medications for this.   Chest pain - first set of enzymes was negative. We will trend her troponin.   Hypothyroid - chronic stable problem, continue home  Synthroid dose. Check TSH.   All the records are reviewed and case discussed with ED provider. Management plans discussed with the patient and/or family.  DVT PROPHYLAXIS: mechanical only  CODE STATUS: Full  TOTAL TIME TAKING CARE OF THIS PATIENT: 50 minutes.    Tien Aispuro FIELDING 04/04/2015, 4:39 AM  [facility]  Office  [phone]  CC: Primary care physician; No PCP Per Patient

## 2015-04-04 NOTE — Consult Note (Signed)
[facility]  [address] [place], [place] [address] Phone: [phone] Fax : [phone]   Gastroenterology Consultation  Referring Provider:     Dr. Anne HahnWillis Primary Care Physician:  Dr. Marijo ConceptionManseno [facility] Primary Gastroenterologist:  Dr. Shelle Ironein       Reason for Consultation:  Hematemesis  Date of Admission:  04/04/2015 Date of Consultation:  04/04/2015         HPI:   Kathryn [name] is a 59 y.o. female who was in her usual state of health and developed severe chest pain yesterday evening. She began to have coughing. After coughing for several minutes, she developed burning epigastric pain and coughed up blood. She then had vomiting of bright red blood, followed by dark coffee ground hematemesis. She's had no further vomiting today. Her pain is 4/10 on pain scale. She takes omeprazole 20 mg daily at home every day. She has history of alcoholic cirrhosis and continues to drink several glasses of vodka each day. She tells me she noted she needs to discontinue drinking but she has not made up her mind yet. Her last EGD was by Dr. Shelle Ironein in November 2015. At that time she had grade 1 varices found at the GE junction, mildly severe esophagitis was found at the GE junction, moderate portal hypertensive gastropathy was found in the gastric fundus. She denies any melena. She admits she is not followed up with her hepatologist and did not show up for an appointment with Dr. Shelle Ironein recently.  Her previous endoscopic work-up has included an EGD by Dr. Bluford Kaufmannh on December 17, 2013 which showed grade 1 esophageal varices, but she has severe portal hypertensive gastropathy. EGD by Dr. Mechele CollinElliott, 08/25/2010, where she had grade 3 varices, which were banded, and changes of portal hypertensive gastropathy. She had colonoscopy the same date which showed internal hemorrhoids. On 01/13/2013, she had grade 2 to 3 varices, which were banded, on EGD by Dr. Mechele CollinElliott. She had an EGD 11/09/2008 where she was found  to have large varices and variceal bleed.   Last ultrasound was 09/25/2014:  Cholelithiasis without evidence to suggest acute cholecystitis, Hepatic steatosis, simple cyst in the lower pole of the right kidney incidentally noted.  Past Medical History  Diagnosis Date  . Asthma   . Sarcoidosis   . Hypothyroidism   . Chronic disease anemia   . Hypertension   . Esophageal varices     GR I on EGD by Dr Shelle Ironein 09/2014  . Portal hypertension   . Alcoholic cirrhosis of liver   . ETOH abuse     Past Surgical History  Procedure Laterality Date  . Tubal ligation    . Esophagogastroduodenoscopy w/ banding  09/2014    Rein   Prior to Admission medications   Medication Sig Start Date End Date Taking? Authorizing Provider  cloNIDine (CATAPRES) 0.1 MG tablet Take 1 tablet by mouth at bedtime.    Historical Provider, MD  levothyroxine (SYNTHROID, LEVOTHROID) 175 MCG tablet Take by mouth.    Historical Provider, MD  nadolol (CORGARD) 80 MG tablet Take by mouth.    Historical Provider, MD  omeprazole (PRILOSEC) 20 MG capsule Take 1 capsule by mouth daily.    Historical Provider, MD  spironolactone (ALDACTONE) 25 MG tablet Take 1 tablet by mouth daily.    Historical Provider, MD    Family History  Problem Relation Age of Onset  . Aneurysm Father   . Heart attack Mother      History  Substance  Use Topics  . Smoking status: Never Smoker   . Smokeless tobacco: Not on file  . Alcohol Use: 0.0 oz/week    0 Standard drinks or equivalent per week    Allergies as of 04/04/2015 - Review Complete 04/04/2015  Allergen Reaction Noted  . Hydrocodone-acetaminophen Other (See Comments) 04/04/2015  . Ibuprofen Other (See Comments) 04/04/2015  . Naproxen Other (See Comments) 04/04/2015  . Propoxyphene Other (See Comments) 04/04/2015  . Tamiflu  [oseltamivir phosphate] Other (See Comments) 04/04/2015    Review of Systems:    All systems reviewed and negative except where noted in HPI.   Physical  Exam:  Vital signs in last 24 hours: Temp:  [97.9 F (36.6 C)-98.4 F (36.9 C)] 98.1 F (36.7 C) (05/12 0750) Pulse Rate:  [79-96] 95 (05/12 0750) Resp:  [14-20] 18 (05/12 0750) BP: (90-160)/(54-89) 132/62 mmHg (05/12 0750) SpO2:  [98 %-100 %] 100 % (05/12 0750) Weight:  [87.998 kg (194 lb)-92.534 kg (204 lb)] 87.998 kg (194 lb) (05/12 0534) Last BM Date: 04/01/15 General:   Alert,  Well-developed, obese, pleasant and cooperative in NAD Head:  Normocephalic and atraumatic. Eyes:  Sclera clear, no icterus.   Conjunctiva pink. Ears:  Normal auditory acuity. Nose:  No deformity, discharge, or lesions. Mouth:  No deformity or lesions,oropharynx pink & moist. Neck:  Supple; no masses or thyromegaly. Lungs:  Respirations even and unlabored.  Clear throughout to auscultation.   No wheezes, crackles, or rhonchi. No acute distress. Heart:  Regular rate and rhythm; no murmurs, clicks, rubs, or gallops. Abdomen:  Obese Normal bowel sounds.  No bruits.  Soft, non-tender and non-distended without masses, hepatosplenomegaly or hernias noted.  No guarding or rebound tenderness.  Rectal:  Deferred.  Msk:  Symmetrical without gross deformities.  Good, equal movement & strength bilaterally. Pulses:  Normal pulses noted. Extremities: + clubbing.   No edema.  No cyanosis. Neurologic:  Alert and oriented x3;  grossly normal neurologically. Skin:  Intact without significant lesions or rashes.  No jaundice. Lymph Nodes:  No significant cervical adenopathy. Psych:  Alert and cooperative. Normal mood and affect.  LAB RESULTS:  Recent Labs  04/04/15 0116 04/04/15 0627  WBC 3.4*  --   HGB 8.2* 7.7*  HCT 26.0*  --   PLT 149*  --    BMET  Recent Labs  04/04/15 0116  NA 139  K 3.4*  CL 101  CO2 14*  GLUCOSE 120*  BUN 29*  CREATININE 1.53*  CALCIUM 8.5*   LFT  Recent Labs  04/04/15 0116  PROT 7.5  ALBUMIN 3.5  AST 824*  ALT 79*  ALKPHOS 187*  BILITOT 1.3*   PT/INR  Recent  Labs  04/04/15 0626  LABPROT 17.0*  INR 1.36    STUDIES: Dg Chest 2 View  04/04/2015   CLINICAL DATA:  Vomiting blood for 2 hours. Weakness and sweats. 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  EXAM: CHEST  2 VIEW  COMPARISON:  02/01/2015  FINDINGS: The heart size and mediastinal contours are within normal limits. Both lungs are clear. The visualized skeletal structures are unremarkable.  IMPRESSION: No active cardiopulmonary disease.   Electronically Signed   By: Burman NievesWilliam  Stevens M.D.   On: 04/04/2015 01:43      Impression / Plan:   Kathryn [name] is a 59 y.o. y/o female with recurrent hematemesis. She has history of esophageal varices with banding. She continues to drink alcohol on a daily basis despite the fact  that she realizes that she has significant alcoholic cirrhosis and portal hypertensive gastropathy. She also has erosive esophagitis. Her hemoglobin has been stable. She is on PPI. She will need an EGD with Dr. Servando Snare to follow-up on hematemesis and look for bleeding esophageal varices.  I have discussed risks & benefits which include, but are not limited to, bleeding, infection, perforation & drug reaction.  The patient agrees with this plan & written consent will be obtained.    Plan: #1 nothing by mouth  #2 agree with PPI #3 EGD with Dr. Servando Snare #4 follow hemoglobin  Thank you for involving me in the care of this patient.      LOS: 0 days   Lorenza Burton, NP  04/04/2015, 10:08 AM

## 2015-04-04 NOTE — ED Notes (Signed)
Pt brought in via ems from home.  Pt has chest pain and vomiting blood tonight.  States vomited x 2-3.  Drank etoh yesterday.  Hx of esoph. Varices.  Nonsmoker.  No sob.  No abd pain.   States chest pain for  2 hours.  Pt alert.

## 2015-04-04 NOTE — ED Notes (Signed)
Patient transported to X-ray 

## 2015-04-05 ENCOUNTER — Encounter: Payer: Self-pay | Admitting: Gastroenterology

## 2015-04-05 LAB — CBC
HCT: 24.3 % — ABNORMAL LOW (ref 35.0–47.0)
HEMOGLOBIN: 7.4 g/dL — AB (ref 12.0–16.0)
MCH: 28.5 pg (ref 26.0–34.0)
MCHC: 30.2 g/dL — ABNORMAL LOW (ref 32.0–36.0)
MCV: 94.4 fL (ref 80.0–100.0)
Platelets: 42 10*3/uL — ABNORMAL LOW (ref 150–440)
RBC: 2.58 MIL/uL — AB (ref 3.80–5.20)
RDW: 21.9 % — AB (ref 11.5–14.5)
WBC: 6.1 10*3/uL (ref 3.6–11.0)

## 2015-04-05 LAB — HEMOGLOBIN
Hemoglobin: 7.8 g/dL — ABNORMAL LOW (ref 12.0–16.0)
Hemoglobin: 7.9 g/dL — ABNORMAL LOW (ref 12.0–16.0)

## 2015-04-05 LAB — COMPREHENSIVE METABOLIC PANEL
ALT: 53 U/L (ref 14–54)
ANION GAP: 12 (ref 5–15)
AST: 269 U/L — AB (ref 15–41)
Albumin: 3.1 g/dL — ABNORMAL LOW (ref 3.5–5.0)
Alkaline Phosphatase: 165 U/L — ABNORMAL HIGH (ref 38–126)
BUN: 34 mg/dL — AB (ref 6–20)
CHLORIDE: 111 mmol/L (ref 101–111)
CO2: 16 mmol/L — AB (ref 22–32)
Calcium: 7.5 mg/dL — ABNORMAL LOW (ref 8.9–10.3)
Creatinine, Ser: 1.4 mg/dL — ABNORMAL HIGH (ref 0.44–1.00)
GFR calc non Af Amer: 41 mL/min — ABNORMAL LOW (ref >60)
GFR, EST AFRICAN AMERICAN: 47 mL/min — AB (ref >60)
Glucose, Bld: 90 mg/dL (ref 65–99)
Potassium: 4.2 mmol/L (ref 3.5–5.1)
Sodium: 139 mmol/L (ref 135–145)
Total Bilirubin: 1.5 mg/dL — ABNORMAL HIGH (ref 0.3–1.2)
Total Protein: 6.4 g/dL — ABNORMAL LOW (ref 6.5–8.1)

## 2015-04-05 LAB — ABO/RH: ABO/RH(D): A POS

## 2015-04-05 LAB — PREPARE RBC (CROSSMATCH)

## 2015-04-05 LAB — HEMOGLOBIN AND HEMATOCRIT, BLOOD
HCT: 24.5 % — ABNORMAL LOW (ref 35.0–47.0)
Hemoglobin: 7.9 g/dL — ABNORMAL LOW (ref 12.0–16.0)

## 2015-04-05 MED ORDER — CIPROFLOXACIN HCL 500 MG PO TABS
500.0000 mg | ORAL_TABLET | Freq: Two times a day (BID) | ORAL | Status: DC
  Administered 2015-04-05 – 2015-04-06 (×3): 500 mg via ORAL
  Filled 2015-04-05 (×3): qty 1

## 2015-04-05 NOTE — Progress Notes (Signed)
Pt c/o constipation and lower abdominal pain with some bright red bleeding from hemorrhoids, HGB also 6.6 with 8pm lap draw. MD notified of HGB and constipation. Tap water enema and 1u PRBC's ordered. HBG currently 7.4 (blood not infused yet at the time of blood draw due to lack of IV access and awaiting lab). Enema given and effective. Pt has had 3 moderate sized dark green BM's. Pt abdominal pain was unresolved after BMs however, md notified and tramadol ordered, given, and effective. Will continue to monitor.

## 2015-04-05 NOTE — Progress Notes (Signed)
  Subjective: Pt feels fine.  Ready to eat.  No further nausea, vomiting or hematemesis.  Denies abdominal pain.  Objective: Vital signs in last 24 hours: Temp:  [98.5 F (36.9 C)-100.2 F (37.9 C)] 99.3 F (37.4 C) (05/13 0548) Pulse Rate:  [66-84] 70 (05/13 0548) Resp:  [11-20] 20 (05/13 0548) BP: (115-172)/(58-90) 129/61 mmHg (05/13 0548) SpO2:  [94 %-100 %] 98 % (05/13 0548) Last BM Date: 04/01/15 No LMP recorded. Patient is postmenopausal. Body mass index is 33.28 kg/(m^2). General:   Alert,  Well-developed, well-nourished, pleasant and cooperative in NAD Head:  Normocephalic and atraumatic. Eyes:  Sclera clear, no icterus.   Conjunctiva pink. Mouth:  No deformity or lesions, oropharynx pink & moist. Neck:  Supple; no masses or thyromegaly. Heart:  Regular rate and rhythm; no murmurs, clicks, rubs, or gallops. Abdomen:   Normal bowel sounds.  Soft, nontender and nondistended. No masses, hepatosplenomegaly or hernias noted.  No guarding or rebound tenderness.   Msk:  Symmetrical without gross deformities. Good equal movement & strength bilaterally. Pulses:  Normal pulses noted. Extremities:  Without clubbing or edema.  No cyanosis Neurologic:  Alert and  oriented x3;  grossly normal neurologically. Skin:  Intact.  Multiple hyperpigmented annular lesions lower legs.   Cervical Nodes:  No significant cervical adenopathy. Psych:  Alert and cooperative. Normal mood and affect.  Intake/Output from previous day: 05/12 0701 - 05/13 0700 In: 3444.3 [I.V.:2944.6; Blood:313; IV Piggyback:186.7] Out: 700 [Urine:700]  Lab Results:  Recent Labs  04/04/15 0116  04/04/15 1424 04/04/15 1953 04/05/15 0228  WBC 3.4*  --   --   --  6.1  HGB 8.2*  < > 7.1* 6.6* 7.4*  HCT 26.0*  --   --   --  24.3*  PLT 149*  --   --   --  42*  < > = values in this interval not displayed. BMET  Recent Labs  04/04/15 0116 04/05/15 0228  NA 139 139  K 3.4* 4.2  CL 101 111  CO2 14* 16*  GLUCOSE  120* 90  BUN 29* 34*  CREATININE 1.53* 1.40*  CALCIUM 8.5* 7.5*   LFT  Recent Labs  04/04/15 0116 04/05/15 0228  PROT 7.5 6.4*  ALBUMIN 3.5 3.1*  AST 824* 269*  ALT 79* 53  ALKPHOS 187* 165*  BILITOT 1.3* 1.5*   PT/INR  Recent Labs  04/04/15 0626  LABPROT 17.0*  INR 1.36   Assessment: #1Grade II esophageal varices with hemorrhage s/p banding: Resolved #2 Anemia secondary to #1: Hgb stable #3 Portal hypertensive gastropathy without active bleeding: Stable #4 Alcoholism: Pt wants to quit #5 Alcoholic Cirrhosis:  Stable  Plan: #1 Cipro 500mg  BID for a total of 7 days #2 Continue PPI BID, nadolol, spironolactone as before upon discharge #3 Urged ETOH cessation #4 Taper octreotide today. May DC tomorrow if she does well #5 Office visit in 2 weeks with me, immediately to ER if recurrent bleeding #6 Clear liquids, advance as tolerated   LOS: 1 day  Lorenza BurtonJONES, Kamora Vossler  04/05/2015, 8:02 AM [facility]  [address] [place], [place] [address] Phone: [phone] Fax : [phone]

## 2015-04-05 NOTE — Care Management Note (Signed)
Case Management Note  Patient Details  Name: Lou MinerKathy B Kwiatek MRN: [id number] Date of Birth: 01/24/1956  Subjective/Objective:    CM assessment. Admitted with hematemesis. He ETOH and esophageal varices.  HGB 7.9 following a unit PRBC yesterday. Presents from home where she lives with family and a friend.  She is independent with adls but does not drives. Reports she does not use assistive devices.  [facility].   Denies issues obtaining medications, copays or transportation. No home care needs identified at this time.            Action/Plan:    Expected Discharge Date:                  Expected Discharge Plan:  Home/Self Care  In-House Referral:     Discharge planning Services     Post Acute Care Choice:    Choice offered to:     DME Arranged:    DME Agency:     HH Arranged:    HH Agency:     Status of Service:     Medicare Important Message Given:  Yes Date Medicare IM Given:  04/05/15 Medicare IM give by:  Gweneth DimitriLisa Kiyomi Pallo, RN BSN Date Additional Medicare IM Given:    Additional Medicare Important Message give by:     If discussed at Long Length of Stay Meetings, dates discussed:    Additional Comments:  Marily MemosLisa M Levie Wages, RN 04/05/2015, 10:32 AM

## 2015-04-05 NOTE — Progress Notes (Signed)
Lab technician reported lab draw last night was clotted slightly resulting in low platelet count. Will notify next nurse at shift report

## 2015-04-05 NOTE — Progress Notes (Signed)
[facility] [place] [facility]   PATIENT NAME: Kathryn IvanoffKathy [name]    MR#:  [id number]  DATE OF BIRTH:  03/26/1956  SUBJECTIVE:  CHIEF COMPLAINT:   Chief Complaint  Patient presents with  . Chest Pain  . Hematemesis   Pt. Here due to hematemesis. S/p Endoscopy and banding of esophogeal varices yesterday.  No further bleeding.  Hg. Stable.    REVIEW OF SYSTEMS:    Review of Systems  Constitutional: Negative for fever and chills.  HENT: Negative for congestion and tinnitus.   Eyes: Negative for blurred vision and double vision.  Respiratory: Negative for cough, shortness of breath and wheezing.   Cardiovascular: Negative for chest pain, orthopnea and PND.  Gastrointestinal: Negative for nausea, vomiting, abdominal pain, diarrhea and blood in stool.  Genitourinary: Negative for dysuria and hematuria.  Neurological: Negative for dizziness, sensory change and focal weakness.  All other systems reviewed and are negative.   Nutrition: Clear liquid diet  Tolerating Diet: Yes  DRUG ALLERGIES:   Allergies  Allergen Reactions  . Hydrocodone-Acetaminophen Other (See Comments)    Reaction: pt can't take anything with tylenol due to her liver disease.   . Ibuprofen Other (See Comments)    Esophageal varices  . Naproxen Other (See Comments)    Esophageal varices  . Propoxyphene Other (See Comments)    Reaction: pt isn't sure, but knows she can't take it.   . Tamiflu  [Oseltamivir Phosphate] Other (See Comments)    Esophageal varices    VITALS:  Blood pressure 137/71, pulse 65, temperature 99.3 F (37.4 C), temperature source Oral, resp. rate 17, height 5\' 4"  (1.626 m), weight 87.998 kg (194 lb), SpO2 100 %.  PHYSICAL EXAMINATION:   Physical Exam  GENERAL:  59 y.o.-year-old patient lying in the bed with no acute distress.  EYES: Pupils equal, round, reactive to light and accommodation. No scleral icterus. Extraocular muscles intact.  HEENT: Head  atraumatic, normocephalic. Oropharynx and nasopharynx clear.  NECK:  Supple, no jugular venous distention. No thyroid enlargement, no tenderness.  LUNGS: Normal breath sounds bilaterally, no wheezing, rales, rhonchi. No use of accessory muscles of respiration.  CARDIOVASCULAR: S1, S2 normal. No murmurs, rubs, or gallops.  ABDOMEN: Soft, nontender, nondistended. Bowel sounds present. No organomegaly or mass.  EXTREMITIES: No cyanosis, clubbing or edema b/l.    NEUROLOGIC: Cranial nerves II through XII are intact. No focal Motor or sensory deficits b/l.   PSYCHIATRIC: The patient is alert and oriented x 3.  SKIN: No obvious rash, lesion, or ulcer.    LABORATORY PANEL:   CBC  Recent Labs Lab 04/05/15 0228 04/05/15 0750  WBC 6.1  --   HGB 7.4* 7.9*  HCT 24.3* 24.5*  PLT 42*  --    ------------------------------------------------------------------------------------------------------------------  Chemistries   Recent Labs Lab 04/05/15 0228  NA 139  K 4.2  CL 111  CO2 16*  GLUCOSE 90  BUN 34*  CREATININE 1.40*  CALCIUM 7.5*  AST 269*  ALT 53  ALKPHOS 165*  BILITOT 1.5*   ------------------------------------------------------------------------------------------------------------------  Cardiac Enzymes  Recent Labs Lab 04/04/15 1740  TROPONINI <0.03   ------------------------------------------------------------------------------------------------------------------  RADIOLOGY:  Dg Chest 2 View  04/04/2015   CLINICAL DATA:  Vomiting blood for 2 hours. Weakness and sweats. 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  EXAM: CHEST  2 VIEW  COMPARISON:  02/01/2015  FINDINGS: The heart size and mediastinal contours are within normal limits. Both lungs are clear. The visualized  skeletal structures are unremarkable.  IMPRESSION: No active cardiopulmonary disease.   Electronically Signed   By: Burman NievesWilliam  Stevens M.D.   On: 04/04/2015 01:43      ASSESSMENT AND PLAN:   59 yo female w/ hx of Liver Cirrhosis, esophogeal varices, Hypothyroidism, ASthma, Sarcoidosis, HTN, came into hospital due to hematemesis.    1. GI bleed - likely upper GI bleed given Hematemesis.  - pt. Has a hx of esophogeal varices. S/p Endoscopy showing grade II varices which were banded.  - no bleeding overnight and Hg. Stable.  - appreciate GI input.  Will cont. Octreotide gtt but will taper. Cont. Protonix BID.  - empirically started on Cipro as per GI.   - start clear liquid diet as per GI  2. Hypothyroidism - cont. Synthroid.   3. HTN - hold BP meds due to hypotension, GI bleed.   4. Anemia - acute blood loss anemia due to GI bleed.  - Hg. Did drop to 6.6 after endoscopy and pt. Got transfused one unit and Hg. Improved.   All the records are reviewed and case discussed with Care Management/Social Workerr. Management plans discussed with the patient, family and they are in agreement.  CODE STATUS: Full  DVT Prophylaxis: TED/SCD  TOTAL TIME TAKING CARE OF THIS PATIENT: 30 minutes.   Likely d/c home tomorrow.      Houston SirenSAINANI,VIVEK J M.D on 04/05/2015 at 11:22 AM  Between 7am to 6pm - Pager - [phone]  After 6pm go to [email or web address] - password [email or web address] [facility]  Office  [phone]  CC: Primary care physician; No PCP Per Patient

## 2015-04-06 LAB — HEMOGLOBIN
Hemoglobin: 7.2 g/dL — ABNORMAL LOW (ref 12.0–16.0)
Hemoglobin: 7.8 g/dL — ABNORMAL LOW (ref 12.0–16.0)

## 2015-04-06 MED ORDER — CIPROFLOXACIN HCL 500 MG PO TABS: 500.0000 mg | ORAL_TABLET | Freq: Two times a day (BID) | ORAL | Status: DC

## 2015-04-06 MED ORDER — NADOLOL 80 MG PO TABS: 40.0000 mg | ORAL_TABLET | Freq: Every day | ORAL | Status: DC

## 2015-04-06 MED ORDER — CIPROFLOXACIN HCL 500 MG PO TABS: 500.0000 mg | ORAL_TABLET | Freq: Two times a day (BID) | ORAL | Status: AC

## 2015-04-06 MED ORDER — PANTOPRAZOLE SODIUM 40 MG PO TBEC: 40.0000 mg | DELAYED_RELEASE_TABLET | Freq: Two times a day (BID) | ORAL | Status: DC

## 2015-04-06 MED ORDER — SPIRONOLACTONE 25 MG PO TABS
25.0000 mg | ORAL_TABLET | Freq: Every day | ORAL | Status: DC
Start: 2015-04-06 — End: 2019-11-20

## 2015-04-06 NOTE — Discharge Instructions (Signed)

## 2015-04-06 NOTE — Discharge Summary (Addendum)
[facility] [place] at [facility]   [name] NAME: Kathryn IvanoffKathy Halseth    MR#:  [id number]  DATE OF BIRTH:  12/18/1955  DATE OF ADMISSION:  04/04/2015 ADMITTING PHYSICIAN: Oralia Manisavid Willis, MD  DATE OF DISCHARGE:  04/06/2015  PRIMARY CARE PHYSICIAN: Dr. Midge [name] Wohl in 2 weeks.     ADMISSION DIAGNOSIS:  Acute upper GI bleed [K92.2]  DISCHARGE DIAGNOSIS:  Principal Problem:   Hematemesis Active Problems:   Cirrhosis   Esophageal varices   Hypothyroid   Hypertension   Chest pain   SECONDARY DIAGNOSIS:   Past Medical History  Diagnosis Date  . Asthma   . Sarcoidosis   . Hypothyroidism   . Chronic disease anemia   . Hypertension   . Esophageal varices     GR I on EGD by Dr Shelle Ironein 09/2014  . Portal hypertension   . Alcoholic cirrhosis of liver   . ETOH abuse     HOSPITAL COURSE:   59 yo female w/ hx of Liver Cirrhosis, esophogeal varices, Hypothyroidism, Asthma, Sarcoidosis, HTN, came into hospital due to hematemesis.   1. GI bleed - this was likely upper GI bleed secondary to esophageal varices. [name] has a previous history of grade 3 esophageal varices.  [name] was admitted to the hospital started on a Protonix drip along with octreotide drip.  A gastroenterology consult was obtained and [name] was seen by Dr. Midge [name] Wohl and he performed upper GI endoscopy and pt. Underwent banding of esophogeal varices.  - [name] status post esophageal variceal banding has had no further episodes of hematemesis or hemoglobin is stable and she is tolerating a soft diet well and therefore being discharged home.  She will continue Protonix twice daily along w/ Nadolol and follow up w/ GI as outpatient.   2. Hypothyroidism - pt. Was maintained on her synthroid and she will continue that.   3. HTN - pt. Will resume her nadolol, Aldactone upon discharge. She is hemodynamically stable.   4. Anemia - this was acute blood loss anemia secondary to the upper GI bleed.  Her  hemoglobin on the day of discharge is 7.2 and stable with no evidence of any further bleeding.  He was transfused a total of 1 pack red blood cells on the hospital.  5. 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 - [name] has no evidence of hepatic encephalopathy or ascites she will continue her maintenance meds as mentioned below and follow up with gastroenterology as an outpatient.    DISCHARGE CONDITIONS:   Stable  CONSULTS OBTAINED:  Treatment Team:  Kathryn [name] Wohl, MD  DRUG ALLERGIES:   Allergies  Allergen Reactions  . Hydrocodone-Acetaminophen Other (See Comments)    Reaction: pt can't take anything with tylenol due to her liver disease.   . Ibuprofen Other (See Comments)    Esophageal varices  . Naproxen Other (See Comments)    Esophageal varices  . Propoxyphene Other (See Comments)    Reaction: pt isn't sure, but knows she can't take it.   . Tamiflu  [Oseltamivir Phosphate] Other (See Comments)    Esophageal varices    DISCHARGE MEDICATIONS:   Current Discharge Medication List    START taking these medications   Details  ciprofloxacin (CIPRO) 500 MG tablet Take 1 tablet (500 mg total) by mouth 2 (two) times daily. Qty: 14 tablet, Refills: 0    pantoprazole (PROTONIX) 40 MG tablet Take 1 tablet (40 mg total) by mouth 2 (two) times daily. Qty: 60 tablet,  Refills: 1      CONTINUE these medications which have CHANGED   Details  nadolol (CORGARD) 80 MG tablet Take 0.5 tablets (40 mg total) by mouth daily. Qty: 30 tablet, Refills: 1      CONTINUE these medications which have NOT CHANGED   Details  cloNIDine (CATAPRES) 0.1 MG tablet Take 1 tablet by mouth at bedtime.    levothyroxine (SYNTHROID, LEVOTHROID) 175 MCG tablet Take by mouth.    spironolactone (ALDACTONE) 25 MG tablet Take 1 tablet by mouth daily.      STOP taking these medications     omeprazole (PRILOSEC) 20 MG capsule          DISCHARGE INSTRUCTIONS:   DIET:  Regular  diet  DISCHARGE CONDITION:  Stable  ACTIVITY:  Activity as tolerated  OXYGEN:  Home Oxygen: No.   Oxygen Delivery: room air  DISCHARGE LOCATION:  home   If you experience worsening of your admission symptoms, develop shortness of breath, life threatening emergency, suicidal or homicidal thoughts you must seek medical attention immediately by calling 911 or calling your MD immediately  if symptoms less severe.  You Must read complete instructions/literature along with all the possible adverse reactions/side effects for all the Medicines you take and that have been prescribed to you. Take any new Medicines after you have completely understood and accpet all the possible adverse reactions/side effects.   Please note  You were cared for by a hospitalist during your hospital stay. If you have any questions about your discharge medications or the care you received while you were in the hospital after you are discharged, you can call the unit and asked to speak with the hospitalist on call if the hospitalist that took care of you is not available. Once you are discharged, your primary care physician will handle any further medical issues. Please note that NO REFILLS for any discharge medications will be authorized once you are discharged, as it is imperative that you return to your primary care physician (or establish a relationship with a primary care physician if you do not have one) for your aftercare needs so that they can reassess your need for medications and monitor your lab values.     Today    VITAL SIGNS:  Blood pressure 162/83, pulse 54, temperature 97.6 F (36.4 C), temperature source Oral, resp. rate 20, height  (1.626 m), weight 87.998 kg (194 lb), SpO2 98 %.  I/O:    Intake/Output Summary (Last 24 hours) at 04/06/15 1204 Last data filed at 04/06/15 1100  Gross per 24 hour  Intake 5044.68 ml  Output      0 ml  Net 5044.68 ml    PHYSICAL EXAMINATION:  GENERAL:   59 y.o.-year-old [name] lying in the bed with no acute distress.  EYES: Pupils equal, round, reactive to light and accommodation. No scleral icterus. Extraocular muscles intact.  HEENT: Head atraumatic, normocephalic. Oropharynx and nasopharynx clear.  NECK:  Supple, no jugular venous distention. No thyroid enlargement, no tenderness.  LUNGS: Normal breath sounds bilaterally, no wheezing, rales,rhonchi or crepitation. No use of accessory muscles of respiration.  CARDIOVASCULAR: S1, S2 normal. No murmurs, rubs, or gallops.  ABDOMEN: Soft, non-tender, non-distended. Bowel sounds present. No organomegaly or mass.  EXTREMITIES: No pedal edema, cyanosis, or clubbing.  NEUROLOGIC: Cranial nerves II through XII are intact. No focal motor or sensory defecits b/l.  PSYCHIATRIC: The [name] is alert and oriented x 3.  SKIN: No obvious rash, lesion, or ulcer.  DATA REVIEW:   CBC  Recent Labs Lab 04/05/15 0228 04/05/15 0750  04/06/15 0738  WBC 6.1  --   --   --   HGB 7.4* 7.9*  < > 7.8*  HCT 24.3* 24.5*  --   --   PLT 42*  --   --   --   < > = values in this interval not displayed.  Chemistries   Recent Labs Lab 04/05/15 0228  NA 139  K 4.2  CL 111  CO2 16*  GLUCOSE 90  BUN 34*  CREATININE 1.40*  CALCIUM 7.5*  AST 269*  ALT 53  ALKPHOS 165*  BILITOT 1.5*    Cardiac Enzymes  Recent Labs Lab 04/04/15 1740  TROPONINI <0.03    Microbiology Results  Results for orders placed or performed in visit on 09/25/14  Culture, blood (single)     Status: None   Collection Time: 09/25/14  3:26 PM  Result Value Ref Range Status   Micro Text Report   Final       COMMENT                   NO GROWTH AEROBICALLY/ANAEROBICALLY IN 5 DAYS   ANTIBIOTIC                                                      Culture, blood (single)     Status: None   Collection Time: 09/25/14  3:49 PM  Result Value Ref Range Status   Micro Text Report   Final       COMMENT                   NO GROWTH  AEROBICALLY/ANAEROBICALLY IN 5 DAYS   ANTIBIOTIC                                                      Urine culture     Status: None   Collection Time: 09/25/14  4:10 PM  Result Value Ref Range Status   Micro Text Report   Final       SOURCE: CLEAN CATCH    COMMENT                   NO GROWTH IN 36 HOURS   ANTIBIOTIC                                                        RADIOLOGY:  No results found.    Management plans discussed with the [name], family and they are in agreement.  CODE STATUS:     Code Status Orders        Start     Ordered   04/04/15 0549  Full code   Continuous     04/04/15 0548      TOTAL TIME TAKING CARE OF THIS [name]: Kathryn minutes.    Houston Siren M.D on 04/06/2015 at 12:04 PM  Between 7am to 6pm - Pager - [phone]  After  6pm go to [email or web address] - password [email or web address]  [facility]  Office  [phone]  CC: Primary care physician; No PCP Per [name]

## 2015-04-09 LAB — TYPE AND SCREEN
ABO/RH(D): A POS
Antibody Screen: NEGATIVE
Unit division: 0

## 2015-05-03 ENCOUNTER — Encounter: Payer: Self-pay | Admitting: Urgent Care

## 2015-05-03 ENCOUNTER — Ambulatory Visit (INDEPENDENT_AMBULATORY_CARE_PROVIDER_SITE_OTHER): Payer: Medicare Other | Admitting: Urgent Care

## 2015-05-03 VITALS — BP 168/96 | HR 65 | Temp 98.1°F | Ht 64.0 in | Wt 201.8 lb

## 2015-05-03 DIAGNOSIS — K7030 Alcoholic cirrhosis of liver without ascites: Secondary | ICD-10-CM | POA: Diagnosis not present

## 2015-05-03 NOTE — Patient Instructions (Addendum)
Follow up to get your thyroid medications with your PCP  Please get your labs as soon as possible.  We will call you with results. Abdominal ultrasound You will need FU with Dr Servando Snare in 6 weeks

## 2015-05-03 NOTE — Assessment & Plan Note (Signed)
Kathryn [name] is a 59 y.o. female here for follow up of ETOH cirrhosis with grade II esophageal varices with hemorrhage s/p banding. She feels well. Some mild swelling in her lower extremities. Last ultrasound was 09/25/2014.

## 2015-05-03 NOTE — Progress Notes (Signed)
Primary Care Physician: Preston Fleeting, MD Primary Gastroenterologist:  Dr Servando Snare  Chief Complaint  Patient presents with  . Cirrhosis  . Esophageal Varices   HPI: Kathryn [name] is a 59 y.o. female here for follow up of ETOH cirrhosis with grade II esophageal varices with hemorrhage s/p banding.  She feels well.  Some mild swelling in her lower extremities.  Last ultrasound was 09/25/2014: Cholelithiasis without evidence to suggest acute cholecystitis, Hepatic steatosis, simple cyst in the lower pole of the right kidney incidentally noted.  Pt quit drinking since hospitalization.  Denies abdominal pain, jaundice, pruritis, dark urine or fever.  She admits she ran out of her thyroid medicine a day or two ago.  Current Outpatient Prescriptions  Medication Sig Dispense Refill  . cloNIDine (CATAPRES) 0.1 MG tablet Take 1 tablet by mouth at bedtime.    Marland Kitchen levothyroxine (SYNTHROID, LEVOTHROID) 175 MCG tablet Take by mouth.    . nadolol (CORGARD) 80 MG tablet Take 0.5 tablets (40 mg total) by mouth daily. 30 tablet 1  . pantoprazole (PROTONIX) 40 MG tablet Take 1 tablet (40 mg total) by mouth 2 (two) times daily before a meal. (Patient taking differently: Take 40 mg by mouth daily. ) 60 tablet 1  . spironolactone (ALDACTONE) 25 MG tablet Take 1 tablet (25 mg total) by mouth daily. 30 tablet 0   No current facility-administered medications for this visit.    Allergies as of 05/03/2015 - Review Complete 05/03/2015  Allergen Reaction Noted  . Hydrocodone-acetaminophen Other (See Comments) 04/04/2015  . Ibuprofen Other (See Comments) 04/04/2015  . Naproxen Other (See Comments) 04/04/2015  . Propoxyphene Other (See Comments) 04/04/2015  . Tamiflu  [oseltamivir phosphate] Other (See Comments) 04/04/2015    Review of Systems: Gen: Denies any fever, chills, fatigue, weakness, malaise ENT: Negative for hoarseness, difficulty swallowing , nasal congestion CV: Denies chest pain, angina,  palpitations, syncope, orthopnea, PND, and claudication. Resp: Denies dyspnea at rest, dyspnea with exercise, cough, sputum, wheezing, coughing up blood, and pleurisy. GI: See HPI GU:  Negative for dysuria, hematuria, urinary incontinence, urinary frequency, nocturnal urination.  Endo: Negative for unusual weight change or sweats Derm: Denies jaundice, rash, itching, or unhealing ulcers.  Psych: Denies depression, anxiety, memory loss, suicidal ideation, hallucinations, paranoia, and confusion. Heme: Denies bruising, bleeding, and enlarged lymph nodes.   Physical Examination:  BP 168/96 mmHg  Pulse 65  Temp(Src) 98.1 F (36.7 C) (Oral)  Ht 5\' 4"  (1.626 m)  Wt 201 lb 12.8 oz (91.536 kg)  BMI 34.62 kg/m2 No LMP recorded. Patient is postmenopausal. General:   Alert,  Well-developed, well-nourished, pleasant and cooperative in NAD Head:  Normocephalic and atraumatic. Eyes:  Sclera clear, no icterus.   Conjunctiva pink. Mouth:  No deformity or lesions.  Oropharynx pink & moist. Neck:  Supple; no masses or thyromegaly. Heart:  Regular rate and rhythm; no murmurs, clicks, rubs,  or gallops. Abdomen:   Normal bowel sounds.  Soft, nontender and nondistended. No masses, hepatosplenomegaly or hernias noted. No guarding or rebound tenderness.   Msk:  Symmetrical without gross deformities. Normal posture. Pulses:  Normal pulses noted. Extremities:  Without clubbing. Trace lower extremity edema. Neurologic:  Alert and  oriented x3;  grossly normal neurologically. Skin:  Intact without significant lesions or rashes. Cervical Nodes:  No significant cervical adenopathy. Psych:  Alert and cooperative. Normal mood and affect.  Imaging Studies: Dg Chest 2 View  04/04/2015   CLINICAL DATA:  Vomiting blood for 2 hours. Weakness  and sweats. 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  EXAM: CHEST  2 VIEW  COMPARISON:  02/01/2015  FINDINGS: The heart size and mediastinal contours  are within normal limits. Both lungs are clear. The visualized skeletal structures are unremarkable.  IMPRESSION: No active cardiopulmonary disease.   Electronically Signed   By: Burman Nieves M.D.   On: 04/04/2015 01:43

## 2015-05-04 LAB — COMPREHENSIVE METABOLIC PANEL WITH GFR
ALT: 11 [IU]/L (ref 0–32)
AST: 28 [IU]/L (ref 0–40)
Albumin/Globulin Ratio: 0.8 — ABNORMAL LOW (ref 1.1–2.5)
Albumin: 3.6 g/dL (ref 3.5–5.5)
Alkaline Phosphatase: 59 [IU]/L (ref 39–117)
BUN/Creatinine Ratio: 11 (ref 9–23)
BUN: 15 mg/dL (ref 6–24)
Bilirubin Total: 0.3 mg/dL (ref 0.0–1.2)
CO2: 22 mmol/L (ref 18–29)
Calcium: 8.9 mg/dL (ref 8.7–10.2)
Chloride: 105 mmol/L (ref 97–108)
Creatinine, Ser: 1.39 mg/dL — ABNORMAL HIGH (ref 0.57–1.00)
GFR calc Af Amer: 48 mL/min/{1.73_m2} — ABNORMAL LOW
GFR calc non Af Amer: 42 mL/min/{1.73_m2} — ABNORMAL LOW
Globulin, Total: 4.4 g/dL (ref 1.5–4.5)
Glucose: 80 mg/dL (ref 65–99)
Potassium: 4.3 mmol/L (ref 3.5–5.2)
Sodium: 142 mmol/L (ref 134–144)
Total Protein: 8 g/dL (ref 6.0–8.5)

## 2015-05-04 LAB — PROTIME-INR
INR: 1 (ref 0.8–1.2)
PROTHROMBIN TIME: 10.4 s (ref 9.1–12.0)

## 2015-05-04 LAB — AFP TUMOR MARKER: AFP-Tumor Marker: 18.1 ng/mL — ABNORMAL HIGH (ref 0.0–8.3)

## 2015-05-06 ENCOUNTER — Telehealth: Payer: Self-pay

## 2015-05-06 NOTE — Telephone Encounter (Signed)
Spoke with patient at this time. Explained that her [place] has been scheduled for 05/09/15 at [facility]. She will need to arrive at 10am and has to be NPO after MN.   Pt verbalizes understanding of this information.

## 2015-05-09 ENCOUNTER — Ambulatory Visit: Admission: RE | Admit: 2015-05-09 | Payer: Medicare Other | Source: Ambulatory Visit

## 2015-05-10 ENCOUNTER — Encounter: Payer: Self-pay | Admitting: Urgent Care

## 2015-05-10 ENCOUNTER — Telehealth: Payer: Self-pay | Admitting: Urgent Care

## 2015-05-10 NOTE — Telephone Encounter (Signed)
If pt returns call, I need to speak with her. Thanks

## 2015-05-13 ENCOUNTER — Encounter: Payer: Self-pay | Admitting: Urgent Care

## 2015-05-13 NOTE — Telephone Encounter (Signed)
Letter sent.

## 2015-07-10 ENCOUNTER — Encounter: Payer: Self-pay | Admitting: Emergency Medicine

## 2015-07-10 ENCOUNTER — Emergency Department
Admission: EM | Admit: 2015-07-10 | Discharge: 2015-07-10 | Disposition: A | Discharge status: Home / Self Care | Payer: Medicare Other | Attending: Emergency Medicine | Admitting: Emergency Medicine

## 2015-07-10 ENCOUNTER — Emergency Department: Payer: Medicare Other

## 2015-07-10 DIAGNOSIS — J069 Acute upper respiratory infection, unspecified: Secondary | ICD-10-CM | POA: Diagnosis not present

## 2015-07-10 DIAGNOSIS — I10 Essential (primary) hypertension: Secondary | ICD-10-CM | POA: Insufficient documentation

## 2015-07-10 DIAGNOSIS — Z7951 Long term (current) use of inhaled steroids: Secondary | ICD-10-CM | POA: Insufficient documentation

## 2015-07-10 DIAGNOSIS — J45909 Unspecified asthma, uncomplicated: Secondary | ICD-10-CM | POA: Diagnosis not present

## 2015-07-10 DIAGNOSIS — R05 Cough: Secondary | ICD-10-CM | POA: Diagnosis present

## 2015-07-10 DIAGNOSIS — Z79899 Other long term (current) drug therapy: Secondary | ICD-10-CM | POA: Insufficient documentation

## 2015-07-10 MED ORDER — FLUTICASONE PROPIONATE 50 MCG/ACT NA SUSP: NASAL | Status: DC

## 2015-07-10 MED ORDER — BENZONATATE 100 MG PO CAPS: ORAL_CAPSULE | ORAL | Status: DC

## 2015-07-10 NOTE — Discharge Instructions (Signed)
Upper Respiratory Infection, Adult An upper respiratory infection (URI) is also sometimes known as the common cold. The upper respiratory tract includes the nose, sinuses, throat, trachea, and bronchi. Bronchi are the airways leading to the lungs. Most people improve within 1 week, but symptoms can last up to 2 weeks. A residual cough may last even longer.  CAUSES Many different viruses can infect the tissues lining the upper respiratory tract. The tissues become irritated and inflamed and often become very moist. Mucus production is also common. A cold is contagious. You can easily spread the virus to others by oral contact. This includes kissing, sharing a glass, coughing, or sneezing. Touching your mouth or nose and then touching a surface, which is then touched by another person, can also spread the virus. SYMPTOMS  Symptoms typically develop 1 to 3 days after you come in contact with a cold virus. Symptoms vary from person to person. They may include:  Runny nose.  Sneezing.  Nasal congestion.  Sinus irritation.  Sore throat.  Loss of voice (laryngitis).  Cough.  Fatigue.  Muscle aches.  Loss of appetite.  Headache.  Low-grade fever. DIAGNOSIS  You might diagnose your own cold based on familiar symptoms, since most people get a cold 2 to 3 times a year. Your caregiver can confirm this based on your exam. Most importantly, your caregiver can check that your symptoms are not due to another disease such as strep throat, sinusitis, pneumonia, asthma, or epiglottitis. Blood tests, throat tests, and X-rays are not necessary to diagnose a common cold, but they may sometimes be helpful in excluding other more serious diseases. Your caregiver will decide if any further tests are required. RISKS AND COMPLICATIONS  You may be at risk for a more severe case of the common cold if you smoke cigarettes, have chronic heart disease (such as heart failure) or lung disease (such as asthma), or if  you have a weakened immune system. The very young and very old are also at risk for more serious infections. Bacterial sinusitis, middle ear infections, and bacterial pneumonia can complicate the common cold. The common cold can worsen asthma and chronic obstructive pulmonary disease (COPD). Sometimes, these complications can require emergency medical care and may be life-threatening. PREVENTION  The best way to protect against getting a cold is to practice good hygiene. Avoid oral or hand contact with people with cold symptoms. Wash your hands often if contact occurs. There is no clear evidence that vitamin C, vitamin E, echinacea, or exercise reduces the chance of developing a cold. However, it is always recommended to get plenty of rest and practice good nutrition. TREATMENT  Treatment is directed at relieving symptoms. There is no cure. Antibiotics are not effective, because the infection is caused by a virus, not by bacteria. Treatment may include:  Increased fluid intake. Sports drinks offer valuable electrolytes, sugars, and fluids.  Breathing heated mist or steam (vaporizer or shower).  Eating chicken soup or other clear broths, and maintaining good nutrition.  Getting plenty of rest.  Using gargles or lozenges for comfort.  Controlling fevers with ibuprofen or acetaminophen as directed by your caregiver.  Increasing usage of your inhaler if you have asthma. Zinc gel and zinc lozenges, taken in the first 24 hours of the common cold, can shorten the duration and lessen the severity of symptoms. Pain medicines may help with fever, muscle aches, and throat pain. A variety of non-prescription medicines are available to treat congestion and runny nose. Your caregiver   can make recommendations and may suggest nasal or lung inhalers for other symptoms.  HOME CARE INSTRUCTIONS   Only take over-the-counter or prescription medicines for pain, discomfort, or fever as directed by your  caregiver.  Use a warm mist humidifier or inhale steam from a shower to increase air moisture. This may keep secretions moist and make it easier to breathe.  Drink enough water and fluids to keep your urine clear or pale yellow.  Rest as needed.  Return to work when your temperature has returned to normal or as your caregiver advises. You may need to stay home longer to avoid infecting others. You can also use a face mask and careful hand washing to prevent spread of the virus. SEEK MEDICAL CARE IF:   After the first few days, you feel you are getting worse rather than better.  You need your caregiver's advice about medicines to control symptoms.  You develop chills, worsening shortness of breath, or brown or red sputum. These may be signs of pneumonia.  You develop yellow or brown nasal discharge or pain in the face, especially when you bend forward. These may be signs of sinusitis.  You develop a fever, swollen neck glands, pain with swallowing, or white areas in the back of your throat. These may be signs of strep throat. SEEK IMMEDIATE MEDICAL CARE IF:   You have a fever.  You develop severe or persistent headache, ear pain, sinus pain, or chest pain.  You develop wheezing, a prolonged cough, cough up blood, or have a change in your usual mucus (if you have chronic lung disease).  You develop sore muscles or a stiff neck. Document Released: 05/05/2001 Document Revised: 02/01/2012 Document Reviewed: 02/14/2014 ExitCare Patient Information 2015 [employer]. This information is not intended to replace advice given to you by your health care provider. Make sure you discuss any questions you have with your health care provider.  

## 2015-07-10 NOTE — ED Provider Notes (Signed)
[facility] Emergency Department Provider Note  ____________________________________________  Time seen: Approximately 11:03 AM  I have reviewed the triage vital signs and the nursing notes.   HISTORY  Chief Complaint Cough and Nasal Congestion   HPI Kathryn [name] is a 59 y.o. female is here with complaint of cough and congestion. Patient states she had chills last night but did not take her temperature. She states she's had some sore throat. She has a great deal of nasal congestion but has not taken any medication for it. She denies any vomiting or diarrhea. Patient states that she was never a smoker.Patient denies any chest pain or shortness of breath.   Past Medical History  Diagnosis Date  . Asthma   . Sarcoidosis   . Hypothyroidism   . Chronic disease anemia   . Hypertension   . Esophageal varices     GR I on EGD by Dr Shelle Iron 09/2014  . Portal hypertension   . Alcoholic cirrhosis of liver   . ETOH abuse     Patient Active Problem List   Diagnosis Date Noted  . Hematemesis 04/04/2015  . Cirrhosis 04/04/2015  . Esophageal varices 04/04/2015  . Hypothyroid 04/04/2015  . Hypertension 04/04/2015  . Chest pain 04/04/2015    Past Surgical History  Procedure Laterality Date  . Tubal ligation    . Esophagogastroduodenoscopy w/ banding  09/2014    Rein  . Esophagogastroduodenoscopy (egd) with propofol N/A 04/04/2015    Procedure: ESOPHAGOGASTRODUODENOSCOPY (EGD) WITH PROPOFOL;  Surgeon: Midge Minium, MD;  Location: [facility] ENDOSCOPY;  Service: Endoscopy;  Laterality: N/A;  Dr Servando Snare prefers [phone]    Current Outpatient Rx  Name  Route  Sig  Dispense  Refill  . benzonatate (TESSALON PERLES) 100 MG capsule      Take 1 or 2 every 8 hours for cough as needed   40 capsule   0   . cloNIDine (CATAPRES) 0.1 MG tablet   Oral   Take 1 tablet by mouth at bedtime.         . fluticasone (FLONASE) 50 MCG/ACT nasal spray      2 sprays each nostril once a  day   1 g   0   . levothyroxine (SYNTHROID, LEVOTHROID) 175 MCG tablet   Oral   Take by mouth.         . nadolol (CORGARD) 80 MG tablet   Oral   Take 0.5 tablets (40 mg total) by mouth daily.   30 tablet   1   . pantoprazole (PROTONIX) 40 MG tablet   Oral   Take 1 tablet (40 mg total) by mouth 2 (two) times daily before a meal. Patient taking differently: Take 40 mg by mouth daily.    60 tablet   1   . spironolactone (ALDACTONE) 25 MG tablet   Oral   Take 1 tablet (25 mg total) by mouth daily.   30 tablet   0     Allergies Hydrocodone-acetaminophen; Ibuprofen; Naproxen; Propoxyphene; and Tamiflu   Family History  Problem Relation Age of Onset  . Aneurysm Father   . Heart attack Mother     Social History Social History  Substance Use Topics  . Smoking status: Never Smoker   . Smokeless tobacco: None  . Alcohol Use: Yes     Comment: Quit ETOH- 04/04/15, Heavy Use prior to this    Review of Systems Constitutional: No fever/positive chills Eyes: No visual changes. ENT: Positive sore throat. Cardiovascular: Denies  chest pain. Respiratory: Denies shortness of breath. Congestive cough Gastrointestinal: No abdominal pain.  No nausea, no vomiting.  No diarrhea.  Genitourinary: Negative for dysuria. Musculoskeletal: Negative for back pain. Skin: Negative for rash. Neurological: Negative for headaches, focal weakness or numbness.  10-point ROS otherwise negative.  ____________________________________________   PHYSICAL EXAM:  VITAL SIGNS: ED Triage Vitals  Enc Vitals Group     BP --      Pulse --      Resp --      Temp --      Temp src --      SpO2 --      Weight --      Height --      Head Cir --      Peak Flow --      Pain Score --      Pain Loc --      Pain Edu? --      Excl. in GC? --     Constitutional: Alert and oriented. Well appearing and in no acute distress. Eyes: Conjunctivae are normal. PERRL. EOMI. Head: Atraumatic. Nose:  Positive congestion bilateral nares/rhinnorhea. Mouth/Throat: Mucous membranes are moist.  Oropharynx positive posterior drainage noted exudate Neck: No stridor. Supple Hematological/Lymphatic/Immunilogical: No cervical lymphadenopathy. Cardiovascular: Normal rate, regular rhythm. Grossly normal heart sounds.  Good peripheral circulation. Respiratory: Normal respiratory effort.  No retractions. Lungs CTAB. Congestive cough Gastrointestinal: Soft and nontender. No distention. Musculoskeletal: No lower extremity tenderness nor edema.  No joint effusions. Neurologic:  Normal speech and language. No gross focal neurologic deficits are appreciated. No gait instability. Skin:  Skin is warm, dry and intact. No rash noted. Psychiatric: Mood and affect are normal. Speech and behavior are normal.  ____________________________________________   LABS (all labs ordered are listed, but only abnormal results are displayed)  Labs Reviewed - No data to display RADIOLOGY  No acute cardiopulmonary disease or significant change per radiologist. Beaulah Corin, personally discussed these images and results by phone with the on-call radiologist and used this discussion as part of my medical decision making.  ____________________________________________   PROCEDURES  Procedure(s) performed: None  Critical Care performed: No  ____________________________________________   INITIAL IMPRESSION / ASSESSMENT AND PLAN / ED COURSE  Pertinent labs & imaging results that were available during my care of the patient were reviewed by me and considered in my medical decision making (see chart for details).  Patient was started on Tessalon Perles for cough. She is also given a prescription for Flonase nasal spray for congestion. Patient was told that this would not make her drowsy and increase her risk for falling. She is to follow-up with her PCP if any continued  problems. ____________________________________________   FINAL CLINICAL IMPRESSION(S) / ED DIAGNOSES  Final diagnoses:  Viral upper respiratory infection      Tommi Rumps, PA-C 07/10/15 1241  Myrna Blazer, MD 07/10/15 2241

## 2015-07-10 NOTE — ED Notes (Signed)
Pt comes in c/o congestion and cough.  Patient describes it as getting worse. C/o chills, congestion, mucous production.

## 2015-07-24 ENCOUNTER — Ambulatory Visit: Payer: Medicare Other

## 2016-01-21 ENCOUNTER — Emergency Department
Admission: EM | Admit: 2016-01-21 | Discharge: 2016-01-21 | Disposition: A | Discharge status: Home / Self Care | Payer: Medicare Other | Attending: Emergency Medicine | Admitting: Emergency Medicine

## 2016-01-21 ENCOUNTER — Emergency Department: Payer: Medicare Other

## 2016-01-21 ENCOUNTER — Encounter: Payer: Self-pay | Admitting: *Deleted

## 2016-01-21 DIAGNOSIS — J159 Unspecified bacterial pneumonia: Secondary | ICD-10-CM | POA: Diagnosis not present

## 2016-01-21 DIAGNOSIS — Z79899 Other long term (current) drug therapy: Secondary | ICD-10-CM | POA: Diagnosis not present

## 2016-01-21 DIAGNOSIS — R600 Localized edema: Secondary | ICD-10-CM | POA: Insufficient documentation

## 2016-01-21 DIAGNOSIS — R609 Edema, unspecified: Secondary | ICD-10-CM

## 2016-01-21 DIAGNOSIS — D649 Anemia, unspecified: Secondary | ICD-10-CM | POA: Insufficient documentation

## 2016-01-21 DIAGNOSIS — I10 Essential (primary) hypertension: Secondary | ICD-10-CM | POA: Insufficient documentation

## 2016-01-21 DIAGNOSIS — J189 Pneumonia, unspecified organism: Secondary | ICD-10-CM

## 2016-01-21 DIAGNOSIS — R05 Cough: Secondary | ICD-10-CM | POA: Diagnosis present

## 2016-01-21 LAB — COMPREHENSIVE METABOLIC PANEL
ALBUMIN: 3.8 g/dL (ref 3.5–5.0)
ALK PHOS: 113 U/L (ref 38–126)
ALT: 27 U/L (ref 14–54)
AST: 148 U/L — AB (ref 15–41)
Anion gap: 10 (ref 5–15)
BILIRUBIN TOTAL: 0.5 mg/dL (ref 0.3–1.2)
BUN: 15 mg/dL (ref 6–20)
CALCIUM: 8.3 mg/dL — AB (ref 8.9–10.3)
CO2: 22 mmol/L (ref 22–32)
Chloride: 103 mmol/L (ref 101–111)
Creatinine, Ser: 1.5 mg/dL — ABNORMAL HIGH (ref 0.44–1.00)
GFR calc Af Amer: 43 mL/min — ABNORMAL LOW (ref >60)
GFR calc non Af Amer: 37 mL/min — ABNORMAL LOW (ref >60)
GLUCOSE: 91 mg/dL (ref 65–99)
POTASSIUM: 4.5 mmol/L (ref 3.5–5.1)
SODIUM: 135 mmol/L (ref 135–145)
TOTAL PROTEIN: 9 g/dL — AB (ref 6.5–8.1)

## 2016-01-21 LAB — PROTIME-INR
INR: 1.24
Prothrombin Time: 15.8 seconds — ABNORMAL HIGH (ref 11.4–15.0)

## 2016-01-21 LAB — CBC WITH DIFFERENTIAL/PLATELET
BASOS ABS: 0 10*3/uL (ref 0–0.1)
Basophils Relative: 0 %
Eosinophils Absolute: 0 10*3/uL (ref 0–0.7)
Eosinophils Relative: 0 %
HEMATOCRIT: 22.8 % — AB (ref 35.0–47.0)
HEMOGLOBIN: 7.1 g/dL — AB (ref 12.0–16.0)
Lymphocytes Relative: 6 %
Lymphs Abs: 0.2 10*3/uL — ABNORMAL LOW (ref 1.0–3.6)
MCH: 25 pg — ABNORMAL LOW (ref 26.0–34.0)
MCHC: 31.2 g/dL — ABNORMAL LOW (ref 32.0–36.0)
MCV: 80 fL (ref 80.0–100.0)
Monocytes Absolute: 0.1 10*3/uL — ABNORMAL LOW (ref 0.2–0.9)
Monocytes Relative: 3 %
Neutro Abs: 2.7 10*3/uL (ref 1.4–6.5)
Neutrophils Relative %: 91 %
Platelets: 121 10*3/uL — ABNORMAL LOW (ref 150–440)
RBC: 2.85 MIL/uL — ABNORMAL LOW (ref 3.80–5.20)
RDW: 24.8 % — ABNORMAL HIGH (ref 11.5–14.5)
WBC: 3 10*3/uL — ABNORMAL LOW (ref 3.6–11.0)

## 2016-01-21 LAB — TROPONIN I: TROPONIN I: 0.03 ng/mL (ref <0.031)

## 2016-01-21 LAB — LIPASE, BLOOD: Lipase: 32 U/L (ref 11–51)

## 2016-01-21 MED ORDER — AMOXICILLIN-POT CLAVULANATE 875-125 MG PO TABS: 1.0000 | ORAL_TABLET | Freq: Two times a day (BID) | ORAL | Status: AC

## 2016-01-21 MED ORDER — AMOXICILLIN-POT CLAVULANATE 875-125 MG PO TABS
1.0000 | ORAL_TABLET | Freq: Once | ORAL | Status: AC
  Administered 2016-01-21: 1 via ORAL
  Filled 2016-01-21: qty 1

## 2016-01-21 NOTE — ED Notes (Signed)
States last night she began coughing yellow phlem, states vomiting that began last night, states left upper abd pain and leg swelling, daughter with pt

## 2016-01-21 NOTE — ED Provider Notes (Addendum)
[facility] Emergency Department Provider Note  ____________________________________________  Time seen: Approximately 830 AM  I have reviewed the triage vital signs and the nursing notes.   HISTORY  Chief Complaint Cough; Emesis; and Leg Swelling    HPI Kathryn [name] is a 60 y.o. female with a history of alcoholic cirrhosis with varices who is presenting today with cough, runny nose left-sided chest pain as well asbilateral lower extremity swelling. The patient says that she was out and about yesterday house shopping when her leg started to swell last night. She does not elevate her legs overnight. However, upon waking this morning she said that she had swelling "like tree trunks" to the bilateral lower extremities. The swelling has since decreased since using compression stockings this morning. She is also complaining of one day of cough with left-sided chest pain which is sharp and only there when coughing. No shortness of breath. Has coughed several times so much that she has vomited but without any blood in the vomitus. Runny nose. No known sick contacts in the home with several children with vomiting. Denies any abdominal pain at this time.   Past Medical History  Diagnosis Date  . Asthma   . Sarcoidosis (HCC)   . Hypothyroidism   . Chronic disease anemia   . Hypertension   . Esophageal varices (HCC)     GR I on EGD by Dr Shelle Iron 09/2014  . Portal hypertension (HCC)   . Alcoholic cirrhosis of liver (HCC)   . ETOH abuse     Patient Active Problem List   Diagnosis Date Noted  . Hematemesis 04/04/2015  . Cirrhosis (HCC) 04/04/2015  . Esophageal varices (HCC) 04/04/2015  . Hypothyroid 04/04/2015  . Hypertension 04/04/2015  . Chest pain 04/04/2015    Past Surgical History  Procedure Laterality Date  . Tubal ligation    . Esophagogastroduodenoscopy w/ banding  09/2014    Rein  . Esophagogastroduodenoscopy (egd) with propofol N/A 04/04/2015   Procedure: ESOPHAGOGASTRODUODENOSCOPY (EGD) WITH PROPOFOL;  Surgeon: Midge Minium, MD;  Location: [facility] ENDOSCOPY;  Service: Endoscopy;  Laterality: N/A;  Dr Servando Snare prefers [phone]    Current Outpatient Rx  Name  Route  Sig  Dispense  Refill  . benzonatate (TESSALON PERLES) 100 MG capsule      Take 1 or 2 every 8 hours for cough as needed   40 capsule   0   . cloNIDine (CATAPRES) 0.1 MG tablet   Oral   Take 1 tablet by mouth at bedtime.         Marland Kitchen EXPIRED: fluticasone (FLONASE) 50 MCG/ACT nasal spray      2 sprays each nostril once a day   1 g   0   . levothyroxine (SYNTHROID, LEVOTHROID) 175 MCG tablet   Oral   Take by mouth.         . nadolol (CORGARD) 80 MG tablet   Oral   Take 0.5 tablets (40 mg total) by mouth daily.   30 tablet   1   . pantoprazole (PROTONIX) 40 MG tablet   Oral   Take 1 tablet (40 mg total) by mouth 2 (two) times daily before a meal. Patient taking differently: Take 40 mg by mouth daily.    60 tablet   1   . spironolactone (ALDACTONE) 25 MG tablet   Oral   Take 1 tablet (25 mg total) by mouth daily.   30 tablet   0     Allergies Hydrocodone-acetaminophen; Ibuprofen;  Naproxen; Propoxyphene; and Tamiflu   Family History  Problem Relation Age of Onset  . Aneurysm Father   . Heart attack Mother     Social History Social History  Substance Use Topics  . Smoking status: Never Smoker   . Smokeless tobacco: None  . Alcohol Use: Yes     Comment: Quit ETOH- 04/04/15, Heavy Use prior to this    Review of Systems Constitutional: No fever/chills Eyes: No visual changes. ENT: No sore throat. Cardiovascular: Left-sided chest pain under the left breast. Respiratory: Denies shortness of breath. Gastrointestinal: No abdominal pain.  No nausea, no vomiting.  No diarrhea.  No constipation. Genitourinary: Negative for dysuria. Musculoskeletal: Negative for back pain. Skin: Negative for rash. Neurological: Negative for headaches, focal weakness  or numbness.  10-point ROS otherwise negative.  ____________________________________________   PHYSICAL EXAM:  VITAL SIGNS: ED Triage Vitals  Enc Vitals Group     BP 01/21/16 0834 147/85 mmHg     Pulse Rate 01/21/16 0834 73     Resp 01/21/16 0834 18     Temp 01/21/16 0834 99.2 F (37.3 C)     Temp Source 01/21/16 0834 Oral     SpO2 01/21/16 0834 100 %     Weight 01/21/16 0834 204 lb (92.534 kg)     Height 01/21/16 0834  (1.6 m)     Head Cir --      Peak Flow --      Pain Score 01/21/16 0834 9     Pain Loc --      Pain Edu? --      Excl. in GC? --     Constitutional: Alert and oriented. Well appearing and in no acute distress. Eyes: Conjunctivae are normal. PERRL. EOMI. Head: Atraumatic. Nose: No congestion/rhinnorhea. Mouth/Throat: Mucous membranes are moist.  Oropharynx non-erythematous. Neck: No stridor.   Cardiovascular: Normal rate, regular rhythm. Grossly normal heart sounds.  Good peripheral circulation. Chest pain is reproducible palpation on the left breast. There is no crepitus or deformity palpated. Respiratory: Normal respiratory effort.  No retractions. Lungs CTAB. Gastrointestinal: Soft and nontender. No distention. No abdominal bruits. No CVA tenderness. Musculoskeletal: Moderate bilateral lower extremity edema which is equal to the ankles up to the mid calves. There are equal and bilateral dorsalis pedis pulses.  No joint effusions. Neurologic:  Normal speech and language. No gross focal neurologic deficits are appreciated. Skin:  Skin is warm, dry and intact. No rash noted. Psychiatric: Mood and affect are normal. Speech and behavior are normal.  ____________________________________________   LABS (all labs ordered are listed, but only abnormal results are displayed)  Labs Reviewed  CBC WITH DIFFERENTIAL/PLATELET - Abnormal; Notable for the following:    WBC 3.0 (*)    RBC 2.85 (*)    Hemoglobin 7.1 (*)    HCT 22.8 (*)    MCH 25.0 (*)     MCHC 31.2 (*)    RDW 24.8 (*)    Platelets 121 (*)    All other components within normal limits  COMPREHENSIVE METABOLIC PANEL - Abnormal; Notable for the following:    Creatinine, Ser 1.50 (*)    Calcium 8.3 (*)    Total Protein 9.0 (*)    AST 148 (*)    GFR calc non Af Amer 37 (*)    GFR calc Af Amer 43 (*)    All other components within normal limits  PROTIME-INR - Abnormal; Notable for the following:    Prothrombin Time 15.8 (*)  All other components within normal limits  LIPASE, BLOOD  TROPONIN I   ____________________________________________  EKG  ED ECG REPORT I, Breeanne Oblinger,  Teena Irani, the attending physician, personally viewed and interpreted this ECG.   Date: 01/21/2016  EKG Time: 849  Rate: 71  Rhythm: normal sinus rhythm  Axis: Normal  Intervals:Prolonged QT interval  ST&T Change: Biphasic T waves in V2 and V3. No abnormal T-wave inversions. No ST elevations or depressions. Multiple past EKGs on record with nonspecific anterolateral T-wave changes. ____________________________________________  RADIOLOGY  Mild left base subsegmental atelectasis or infiltrate. ____________________________________________   PROCEDURES    ____________________________________________   INITIAL IMPRESSION / ASSESSMENT AND PLAN / ED COURSE  Pertinent labs & imaging results that were available during my care of the patient were reviewed by me and considered in my medical decision making (see chart for details).  ----------------------------------------- 10:54 AM on 01/21/2016 -----------------------------------------  Patient is resting comfortably and there is been no worsening and the swelling of her bilateral lower extremity. I discussed the workup results with the family as well as the chest x-ray.  I will give the patient doxycycline for discharge home to treat the suspected pneumonia. I advised the family to make sure that her legs are elevated as well as that she is  using her compression stockings. We also discussed the hemoglobin of 7.1. This is about at the patient's baseline. However, since it is borderline low for the patient she'll be seen by her primary care doctor later in the week to recheck a hemoglobin level. Edema is likely secondary to patient's ongoing disease. ____________________________________________   FINAL CLINICAL IMPRESSION(S) / ED DIAGNOSES  Chronic anemia. Pneumonia. Peripheral edema.    Myrna Blazer, MD 01/21/16 1056  Patient has not been taking her iron as prescribed. Counseled the patient to resume taking her iron as this may help with her hemoglobin. Also, discussed being unable to give to antibiotic treatment for the patient's pneumonia secondary to her ongoing medical issues. The patient has a prolonged QT interval as well as esophageal varices. This precludes me from giving her a macrolide or any other QT prolonging medications. I also could not give her doxycycline because of her known esophageal varices. She was given Augmentin. I counseled her as well as the daughter about this and they noted return to the emergency department if there is any worsening of her symptoms.  Myrna Blazer, MD 01/21/16 [id number]

## 2016-01-21 NOTE — ED Notes (Addendum)
MD at bedside, pt states she has a lot of people sick at home, states she was on her legs a lot yesterday house shopping, states she drinks about 1 pint of liquor daily

## 2016-01-21 NOTE — ED Notes (Signed)
Patient transported to X-ray 

## 2016-01-21 NOTE — ED Notes (Signed)
Pt resting in bed, eyes closed, resp even and unlabored, daughter at bedside 

## 2016-02-05 ENCOUNTER — Emergency Department: Payer: Medicare Other

## 2016-02-05 ENCOUNTER — Encounter: Payer: Self-pay | Admitting: Emergency Medicine

## 2016-02-05 ENCOUNTER — Emergency Department
Admission: EM | Admit: 2016-02-05 | Discharge: 2016-02-05 | Disposition: A | Discharge status: Home / Self Care | Payer: Medicare Other | Attending: Emergency Medicine | Admitting: Emergency Medicine

## 2016-02-05 DIAGNOSIS — D869 Sarcoidosis, unspecified: Secondary | ICD-10-CM | POA: Diagnosis not present

## 2016-02-05 DIAGNOSIS — D638 Anemia in other chronic diseases classified elsewhere: Secondary | ICD-10-CM | POA: Insufficient documentation

## 2016-02-05 DIAGNOSIS — I517 Cardiomegaly: Secondary | ICD-10-CM | POA: Diagnosis not present

## 2016-02-05 DIAGNOSIS — Z79899 Other long term (current) drug therapy: Secondary | ICD-10-CM | POA: Insufficient documentation

## 2016-02-05 DIAGNOSIS — J45909 Unspecified asthma, uncomplicated: Secondary | ICD-10-CM | POA: Insufficient documentation

## 2016-02-05 DIAGNOSIS — E039 Hypothyroidism, unspecified: Secondary | ICD-10-CM | POA: Insufficient documentation

## 2016-02-05 DIAGNOSIS — Z7951 Long term (current) use of inhaled steroids: Secondary | ICD-10-CM | POA: Insufficient documentation

## 2016-02-05 DIAGNOSIS — K766 Portal hypertension: Secondary | ICD-10-CM | POA: Insufficient documentation

## 2016-02-05 DIAGNOSIS — R079 Chest pain, unspecified: Secondary | ICD-10-CM | POA: Diagnosis present

## 2016-02-05 LAB — TROPONIN I

## 2016-02-05 LAB — COMPREHENSIVE METABOLIC PANEL
ALBUMIN: 3.9 g/dL (ref 3.5–5.0)
ALK PHOS: 74 U/L (ref 38–126)
ALT: 13 U/L — AB (ref 14–54)
AST: 64 U/L — ABNORMAL HIGH (ref 15–41)
Anion gap: 11 (ref 5–15)
BILIRUBIN TOTAL: 0.6 mg/dL (ref 0.3–1.2)
BUN: 18 mg/dL (ref 6–20)
CALCIUM: 8.8 mg/dL — AB (ref 8.9–10.3)
CO2: 19 mmol/L — AB (ref 22–32)
CREATININE: 1.33 mg/dL — AB (ref 0.44–1.00)
Chloride: 111 mmol/L (ref 101–111)
GFR calc Af Amer: 50 mL/min — ABNORMAL LOW (ref >60)
GFR calc non Af Amer: 43 mL/min — ABNORMAL LOW (ref >60)
GLUCOSE: 72 mg/dL (ref 65–99)
Potassium: 3.5 mmol/L (ref 3.5–5.1)
SODIUM: 141 mmol/L (ref 135–145)
Total Protein: 8.8 g/dL — ABNORMAL HIGH (ref 6.5–8.1)

## 2016-02-05 LAB — CBC WITH DIFFERENTIAL/PLATELET
BASOS PCT: 3 %
Basophils Absolute: 0.1 10*3/uL (ref 0–0.1)
EOS ABS: 0.1 10*3/uL (ref 0–0.7)
EOS PCT: 4 %
HEMATOCRIT: 23.4 % — AB (ref 35.0–47.0)
Hemoglobin: 7.1 g/dL — ABNORMAL LOW (ref 12.0–16.0)
Lymphocytes Relative: 23 %
Lymphs Abs: 0.7 10*3/uL — ABNORMAL LOW (ref 1.0–3.6)
MCH: 24.4 pg — ABNORMAL LOW (ref 26.0–34.0)
MCHC: 30.5 g/dL — AB (ref 32.0–36.0)
MCV: 80.1 fL (ref 80.0–100.0)
MONO ABS: 0.2 10*3/uL (ref 0.2–0.9)
MONOS PCT: 8 %
Neutro Abs: 1.8 10*3/uL (ref 1.4–6.5)
Neutrophils Relative %: 62 %
PLATELETS: 195 10*3/uL (ref 150–440)
RBC: 2.92 MIL/uL — ABNORMAL LOW (ref 3.80–5.20)
RDW: 23.7 % — AB (ref 11.5–14.5)
WBC: 2.9 10*3/uL — ABNORMAL LOW (ref 3.6–11.0)

## 2016-02-05 LAB — FIBRIN DERIVATIVES D-DIMER (ARMC ONLY): FIBRIN DERIVATIVES D-DIMER (ARMC): 673 — AB (ref 0–499)

## 2016-02-05 LAB — BRAIN NATRIURETIC PEPTIDE: B Natriuretic Peptide: 65 pg/mL (ref 0.0–100.0)

## 2016-02-05 MED ORDER — IOHEXOL 350 MG/ML SOLN
75.0000 mL | Freq: Once | INTRAVENOUS | Status: AC | PRN
  Administered 2016-02-05: 75 mL via INTRAVENOUS

## 2016-02-05 NOTE — ED Provider Notes (Signed)
[facility] Emergency Department Provider Note  ____________________________________________  Time seen: Approximately 1:07 PM  I have reviewed the triage vital signs and the nursing notes.   HISTORY  Chief Complaint Cough   HPI Kathryn [name] is a 60 y.o. female who was treated with Augmentin for pneumonia recently. She finished Augmentin within the last couple days. [place] she was being [place] is being evicted from her house by her sister. She complained of pain in the left lower chest and coughing called EMS. She is worried that she might of had a recurrence of pneumonia. Patient's daughter has come here to watch over her from [place]. Patient's daughter reports patient is having trouble getting in and out of the bathtub and is having some trouble caring for herself. Patient is still drinking about a fifth of hard liquor a day. Patient is not coughing in the emergency room. Patient does not remember having a fever lately. Patient reports when she does cough she brings up some white and yellow phlegm. The chest pain is somewhat worse if she breathes. It is not severe mild to moderate with the about her description.    Past Medical History  Diagnosis Date  . Asthma   . Sarcoidosis (HCC)   . Hypothyroidism   . Chronic disease anemia   . Hypertension   . Esophageal varices (HCC)     GR I on EGD by Dr Shelle Iron 09/2014  . Portal hypertension (HCC)   . Alcoholic cirrhosis of liver (HCC)   . ETOH abuse     Patient Active Problem List   Diagnosis Date Noted  . Hematemesis 04/04/2015  . Cirrhosis (HCC) 04/04/2015  . Esophageal varices (HCC) 04/04/2015  . Hypothyroid 04/04/2015  . Hypertension 04/04/2015  . Chest pain 04/04/2015    Past Surgical History  Procedure Laterality Date  . Tubal ligation    . Esophagogastroduodenoscopy w/ banding  09/2014    Rein  . Esophagogastroduodenoscopy (egd) with propofol N/A 04/04/2015    Procedure:  ESOPHAGOGASTRODUODENOSCOPY (EGD) WITH PROPOFOL;  Surgeon: Midge Minium, MD;  Location: [facility] ENDOSCOPY;  Service: Endoscopy;  Laterality: N/A;  Dr Servando Snare prefers [phone]    Current Outpatient Rx  Name  Route  Sig  Dispense  Refill  . benzonatate (TESSALON PERLES) 100 MG capsule      Take 1 or 2 every 8 hours for cough as needed   40 capsule   0   . cloNIDine (CATAPRES) 0.1 MG tablet   Oral   Take 1 tablet by mouth at bedtime.         Marland Kitchen EXPIRED: fluticasone (FLONASE) 50 MCG/ACT nasal spray      2 sprays each nostril once a day   1 g   0   . levothyroxine (SYNTHROID, LEVOTHROID) 175 MCG tablet   Oral   Take by mouth.         . nadolol (CORGARD) 80 MG tablet   Oral   Take 0.5 tablets (40 mg total) by mouth daily.   30 tablet   1   . pantoprazole (PROTONIX) 40 MG tablet   Oral   Take 1 tablet (40 mg total) by mouth 2 (two) times daily before a meal. Patient taking differently: Take 40 mg by mouth daily.    60 tablet   1   . spironolactone (ALDACTONE) 25 MG tablet   Oral   Take 1 tablet (25 mg total) by mouth daily.   30 tablet  0     Allergies Hydrocodone-acetaminophen; Ibuprofen; Naproxen; Propoxyphene; and Tamiflu   Family History  Problem Relation Age of Onset  . Aneurysm Father   . Heart attack Mother     Social History Social History  Substance Use Topics  . Smoking status: Never Smoker   . Smokeless tobacco: None  . Alcohol Use: Yes     Comment: Quit ETOH- 04/04/15, Heavy Use prior to this    Review of Systems Constitutional: No fever/chills Eyes: No visual changes. ENT: No sore throat. Cardiovascular: See history of present illness Respiratory: Denies shortness of breath. Gastrointestinal: No abdominal pain.  No nausea, no vomiting.  No diarrhea.  No constipation. Genitourinary: Negative for dysuria. Musculoskeletal: Negative for back pain. Skin: Negative for rash. Neurological: Negative for headaches, focal weakness or numbness.  10-point  ROS otherwise negative.  ____________________________________________   PHYSICAL EXAM:  VITAL SIGNS: ED Triage Vitals  Enc Vitals Group     BP 02/05/16 1132 157/100 mmHg     Pulse Rate 02/05/16 1132 76     Resp 02/05/16 1132 18     Temp 02/05/16 1140 97.5 F (36.4 C)     Temp Source 02/05/16 1140 Oral     SpO2 02/05/16 1132 96 %     Weight --      Height --      Head Cir --      Peak Flow --      Pain Score 02/05/16 1132 7     Pain Loc --      Pain Edu? --      Excl. in GC? --     Constitutional: Alert and oriented. Well appearing and in no acute distress. Eyes: Conjunctivae are normal. PERRL. EOMI. Head: Atraumatic. Nose: No congestion/rhinnorhea. Mouth/Throat: Mucous membranes are moist.  Oropharynx non-erythematous. Neck: No stridor.  Cardiovascular: Normal rate, regular rhythm. Grossly normal heart sounds.  Good peripheral circulation. Respiratory: Normal respiratory effort.  No retractions. Lungs CTAB. Gastrointestinal: Soft and nontender. No distention. No abdominal bruits. No CVA tenderness. Musculoskeletal: No lower extremity tenderness nor edema.  No joint effusions. Neurologic:  Normal speech and language. No gross focal neurologic deficits are appreciated. No gait instability. Skin:  Skin is warm, dry and intact. No rash noted. Psychiatric: Mood and affect are normal. Speech and behavior are normal.  ____________________________________________   LABS (all labs ordered are listed, but only abnormal results are displayed)  Labs Reviewed  COMPREHENSIVE METABOLIC PANEL - Abnormal; Notable for the following:    CO2 19 (*)    Creatinine, Ser 1.33 (*)    Calcium 8.8 (*)    Total Protein 8.8 (*)    AST 64 (*)    ALT 13 (*)    GFR calc non Af Amer 43 (*)    GFR calc Af Amer 50 (*)    All other components within normal limits  CBC WITH DIFFERENTIAL/PLATELET - Abnormal; Notable for the following:    WBC 2.9 (*)    RBC 2.92 (*)    Hemoglobin 7.1 (*)    HCT  23.4 (*)    MCH 24.4 (*)    MCHC 30.5 (*)    RDW 23.7 (*)    Lymphs Abs 0.7 (*)    All other components within normal limits  FIBRIN DERIVATIVES D-DIMER ([facility] ONLY) - Abnormal; Notable for the following:    Fibrin derivatives D-dimer ([facility]) 673 (*)    All other components within normal limits  BRAIN NATRIURETIC PEPTIDE  TROPONIN I  ____________________________________________  EKG  EKG read and interpreted by me shows normal sinus rhythm rate of 73 normal axis some minimal T-wave inversion laterally in the V leads ____________________________________________  RADIOLOGY  Radiology reads CT scan as showing mild cardiomegaly and mild pulmonary edema both of which are new I discussed these with Dr. Tildon Husky the hospitalist she feels that outpatient follow-up with cardiology will be adequate. ____________________________________________   PROCEDURES    ____________________________________________   INITIAL IMPRESSION / ASSESSMENT AND PLAN / ED COURSE  Pertinent labs & imaging results that were available during my care of the patient were reviewed by me and considered in my medical decision making (see chart for details).   ____________________________________________   FINAL CLINICAL IMPRESSION(S) / ED DIAGNOSES  Final diagnoses:  Enlarged heart      Arnaldo Natal, MD 02/05/16 2156

## 2016-02-05 NOTE — Progress Notes (Signed)
LCSW was called down to room 16 for housing resources and support. Will engage with patient at 2pm

## 2016-02-05 NOTE — Progress Notes (Signed)
LCSW met with patients daughter. She reported that she is from [place] and has returned to support her mother and her children, Mother adopted her kids. It was explained her mother has drinking issues and serious health issues and lives at her sisters place. The eviction process notice has started and these 4 family members will need to vacate the premises shortly.  LCSW reviewed community resources. Legal Aid- for court matters, subsidized housing, explained they could access SS and explain situation and possibly access emergency funding. LCSW in addition provided a detailed resource list for [facility] which had housing food and shelters and mental health/substance use rehabilitation centers and mental health out patient services. LCSW in collecting data reviewed concerns for the 2 children in the grandmothers care. They are 2 female children ages 68 and 36 both doing very well in school with no truancy issues and good grades. The mother will ensure the boys have adequate housing. She disclosed they receive up to 1450 per month. LCSW assessed there or now CPS issues at this time.They have several applications for apartments out in the community. Patients daughter thanked LCSW no further needs required.  [employer] LCSW [phone]

## 2016-02-05 NOTE — ED Notes (Signed)
Pt bib ems from court today. Pt reports a cough. Pt reports having a dx of pneumonia 2 weeks ago and treated with antibiotic. Pt states" My PCP never would give me an appointment."

## 2016-02-11 ENCOUNTER — Emergency Department: Payer: Medicare Other

## 2016-02-11 ENCOUNTER — Encounter: Payer: Self-pay | Admitting: Urgent Care

## 2016-02-11 ENCOUNTER — Observation Stay
Admission: EM | Admit: 2016-02-11 | Discharge: 2016-02-13 | Disposition: A | Discharge status: Home / Self Care | Payer: Medicare Other | Attending: Internal Medicine | Admitting: Internal Medicine

## 2016-02-11 DIAGNOSIS — I129 Hypertensive chronic kidney disease with stage 1 through stage 4 chronic kidney disease, or unspecified chronic kidney disease: Secondary | ICD-10-CM | POA: Insufficient documentation

## 2016-02-11 DIAGNOSIS — I4581 Long QT syndrome: Secondary | ICD-10-CM | POA: Insufficient documentation

## 2016-02-11 DIAGNOSIS — E162 Hypoglycemia, unspecified: Secondary | ICD-10-CM

## 2016-02-11 DIAGNOSIS — E039 Hypothyroidism, unspecified: Secondary | ICD-10-CM | POA: Diagnosis not present

## 2016-02-11 DIAGNOSIS — Z859 Personal history of malignant neoplasm, unspecified: Secondary | ICD-10-CM | POA: Diagnosis not present

## 2016-02-11 DIAGNOSIS — F10920 Alcohol use, unspecified with intoxication, uncomplicated: Secondary | ICD-10-CM

## 2016-02-11 DIAGNOSIS — N183 Chronic kidney disease, stage 3 (moderate): Secondary | ICD-10-CM | POA: Insufficient documentation

## 2016-02-11 DIAGNOSIS — J45909 Unspecified asthma, uncomplicated: Secondary | ICD-10-CM | POA: Insufficient documentation

## 2016-02-11 DIAGNOSIS — K766 Portal hypertension: Secondary | ICD-10-CM | POA: Insufficient documentation

## 2016-02-11 DIAGNOSIS — R0602 Shortness of breath: Secondary | ICD-10-CM | POA: Diagnosis not present

## 2016-02-11 DIAGNOSIS — I8510 Secondary esophageal varices without bleeding: Secondary | ICD-10-CM | POA: Diagnosis not present

## 2016-02-11 DIAGNOSIS — K7030 Alcoholic cirrhosis of liver without ascites: Secondary | ICD-10-CM | POA: Insufficient documentation

## 2016-02-11 DIAGNOSIS — R531 Weakness: Secondary | ICD-10-CM | POA: Diagnosis not present

## 2016-02-11 DIAGNOSIS — K920 Hematemesis: Secondary | ICD-10-CM | POA: Diagnosis not present

## 2016-02-11 DIAGNOSIS — R079 Chest pain, unspecified: Secondary | ICD-10-CM | POA: Diagnosis not present

## 2016-02-11 DIAGNOSIS — R1013 Epigastric pain: Secondary | ICD-10-CM | POA: Insufficient documentation

## 2016-02-11 DIAGNOSIS — E876 Hypokalemia: Secondary | ICD-10-CM | POA: Insufficient documentation

## 2016-02-11 DIAGNOSIS — F10120 Alcohol abuse with intoxication, uncomplicated: Secondary | ICD-10-CM | POA: Diagnosis present

## 2016-02-11 DIAGNOSIS — D638 Anemia in other chronic diseases classified elsewhere: Secondary | ICD-10-CM | POA: Diagnosis not present

## 2016-02-11 LAB — GLUCOSE, CAPILLARY
GLUCOSE-CAPILLARY: 112 mg/dL — AB (ref 65–99)
GLUCOSE-CAPILLARY: 56 mg/dL — AB (ref 65–99)
GLUCOSE-CAPILLARY: 62 mg/dL — AB (ref 65–99)
GLUCOSE-CAPILLARY: 64 mg/dL — AB (ref 65–99)
GLUCOSE-CAPILLARY: 68 mg/dL (ref 65–99)
GLUCOSE-CAPILLARY: 75 mg/dL (ref 65–99)
GLUCOSE-CAPILLARY: 77 mg/dL (ref 65–99)
GLUCOSE-CAPILLARY: 98 mg/dL (ref 65–99)
Glucose-Capillary: 49 mg/dL — ABNORMAL LOW (ref 65–99)
Glucose-Capillary: 63 mg/dL — ABNORMAL LOW (ref 65–99)
Glucose-Capillary: 58 mg/dL — ABNORMAL LOW (ref 65–99)
Glucose-Capillary: 101 mg/dL — ABNORMAL HIGH (ref 65–99)
Glucose-Capillary: 99 mg/dL (ref 65–99)
Glucose-Capillary: 109 mg/dL — ABNORMAL HIGH (ref 65–99)

## 2016-02-11 LAB — CBC
HCT: 25.2 % — ABNORMAL LOW (ref 35.0–47.0)
HEMOGLOBIN: 7.9 g/dL — AB (ref 12.0–16.0)
MCH: 25.5 pg — AB (ref 26.0–34.0)
MCHC: 31.2 g/dL — ABNORMAL LOW (ref 32.0–36.0)
MCV: 81.7 fL (ref 80.0–100.0)
PLATELETS: 260 10*3/uL (ref 150–440)
RBC: 3.09 MIL/uL — AB (ref 3.80–5.20)
RDW: 24.1 % — ABNORMAL HIGH (ref 11.5–14.5)
WBC: 4.3 10*3/uL (ref 3.6–11.0)

## 2016-02-11 LAB — COMPREHENSIVE METABOLIC PANEL
ALT: 20 U/L (ref 14–54)
ANION GAP: 20 — AB (ref 5–15)
AST: 92 U/L — AB (ref 15–41)
Albumin: 4.6 g/dL (ref 3.5–5.0)
Alkaline Phosphatase: 87 U/L (ref 38–126)
BILIRUBIN TOTAL: 1 mg/dL (ref 0.3–1.2)
BUN: 18 mg/dL (ref 6–20)
CHLORIDE: 103 mmol/L (ref 101–111)
CO2: 13 mmol/L — ABNORMAL LOW (ref 22–32)
Calcium: 8.5 mg/dL — ABNORMAL LOW (ref 8.9–10.3)
Creatinine, Ser: 1.77 mg/dL — ABNORMAL HIGH (ref 0.44–1.00)
GFR, EST AFRICAN AMERICAN: 35 mL/min — AB (ref >60)
GFR, EST NON AFRICAN AMERICAN: 30 mL/min — AB (ref >60)
Glucose, Bld: 32 mg/dL — CL (ref 65–99)
POTASSIUM: 3.8 mmol/L (ref 3.5–5.1)
Sodium: 136 mmol/L (ref 135–145)
TOTAL PROTEIN: 9.9 g/dL — AB (ref 6.5–8.1)

## 2016-02-11 LAB — BASIC METABOLIC PANEL
ANION GAP: 17 — AB (ref 5–15)
BUN: 18 mg/dL (ref 6–20)
CALCIUM: 8.1 mg/dL — AB (ref 8.9–10.3)
CO2: 12 mmol/L — ABNORMAL LOW (ref 22–32)
Chloride: 110 mmol/L (ref 101–111)
Creatinine, Ser: 1.45 mg/dL — ABNORMAL HIGH (ref 0.44–1.00)
GFR calc Af Amer: 45 mL/min — ABNORMAL LOW (ref >60)
GFR, EST NON AFRICAN AMERICAN: 39 mL/min — AB (ref >60)
GLUCOSE: 51 mg/dL — AB (ref 65–99)
POTASSIUM: 3.2 mmol/L — AB (ref 3.5–5.1)
SODIUM: 139 mmol/L (ref 135–145)

## 2016-02-11 LAB — ETHANOL: ALCOHOL ETHYL (B): 208 mg/dL — AB (ref <5)

## 2016-02-11 LAB — BRAIN NATRIURETIC PEPTIDE: B NATRIURETIC PEPTIDE 5: 28 pg/mL (ref 0.0–100.0)

## 2016-02-11 LAB — BETA-HYDROXYBUTYRIC ACID: Beta-Hydroxybutyric Acid: 3.62 mmol/L — ABNORMAL HIGH (ref 0.05–0.27)

## 2016-02-11 LAB — TROPONIN I
TROPONIN I: 0.05 ng/mL — AB (ref <0.031)
Troponin I: 0.03 ng/mL (ref <0.031)

## 2016-02-11 LAB — FERRITIN: FERRITIN: 15 ng/mL (ref 11–307)

## 2016-02-11 MED ORDER — LEVOTHYROXINE SODIUM 175 MCG PO TABS
175.0000 ug | ORAL_TABLET | Freq: Every day | ORAL | Status: DC
  Administered 2016-02-12 – 2016-02-13 (×2): 175 ug via ORAL
  Filled 2016-02-11 (×3): qty 1

## 2016-02-11 MED ORDER — ACETAMINOPHEN 650 MG RE SUPP: 650.0000 mg | Freq: Four times a day (QID) | RECTAL | Status: DC | PRN

## 2016-02-11 MED ORDER — DEXTROSE 5 % IV SOLN
Freq: Once | INTRAVENOUS | Status: AC
  Administered 2016-02-11: 16:00:00 via INTRAVENOUS

## 2016-02-11 MED ORDER — DEXTROSE 5 % IV SOLN
INTRAVENOUS | Status: DC
  Administered 2016-02-11: 20:00:00 via INTRAVENOUS

## 2016-02-11 MED ORDER — SPIRONOLACTONE 25 MG PO TABS
25.0000 mg | ORAL_TABLET | Freq: Every day | ORAL | Status: DC
  Administered 2016-02-11 – 2016-02-13 (×3): 25 mg via ORAL
  Filled 2016-02-11 (×3): qty 1

## 2016-02-11 MED ORDER — FENTANYL CITRATE (PF) 100 MCG/2ML IJ SOLN
50.0000 ug | Freq: Once | INTRAMUSCULAR | Status: AC
  Administered 2016-02-11: 50 ug via INTRAVENOUS
  Filled 2016-02-11: qty 2

## 2016-02-11 MED ORDER — PANTOPRAZOLE SODIUM 40 MG IV SOLR
40.0000 mg | Freq: Two times a day (BID) | INTRAVENOUS | Status: DC
  Administered 2016-02-12 (×2): 40 mg via INTRAVENOUS
  Filled 2016-02-11 (×2): qty 40

## 2016-02-11 MED ORDER — FLUTICASONE PROPIONATE 50 MCG/ACT NA SUSP
1.0000 | Freq: Every day | NASAL | Status: DC
  Administered 2016-02-11 – 2016-02-13 (×3): 1 via NASAL
  Filled 2016-02-11: qty 16

## 2016-02-11 MED ORDER — TRAMADOL HCL 50 MG PO TABS
50.0000 mg | ORAL_TABLET | Freq: Three times a day (TID) | ORAL | Status: DC | PRN
  Administered 2016-02-11: 50 mg via ORAL
  Filled 2016-02-11: qty 1

## 2016-02-11 MED ORDER — CLONIDINE HCL 0.1 MG PO TABS
0.1000 mg | ORAL_TABLET | Freq: Every day | ORAL | Status: DC
  Administered 2016-02-11: 0.1 mg via ORAL
  Filled 2016-02-11 (×2): qty 1

## 2016-02-11 MED ORDER — ACETAMINOPHEN 325 MG PO TABS: 650.0000 mg | ORAL_TABLET | Freq: Four times a day (QID) | ORAL | Status: DC | PRN

## 2016-02-11 MED ORDER — ONDANSETRON HCL 4 MG/2ML IJ SOLN
4.0000 mg | Freq: Once | INTRAMUSCULAR | Status: AC
  Administered 2016-02-11: 4 mg via INTRAVENOUS

## 2016-02-11 MED ORDER — NADOLOL 20 MG PO TABS
40.0000 mg | ORAL_TABLET | Freq: Every day | ORAL | Status: DC
  Administered 2016-02-11 – 2016-02-13 (×3): 40 mg via ORAL
  Filled 2016-02-11: qty 2
  Filled 2016-02-11: qty 1
  Filled 2016-02-11: qty 2

## 2016-02-11 MED ORDER — PANTOPRAZOLE SODIUM 40 MG IV SOLR
40.0000 mg | Freq: Two times a day (BID) | INTRAVENOUS | Status: DC
  Administered 2016-02-11: 40 mg via INTRAVENOUS
  Filled 2016-02-11: qty 40

## 2016-02-11 MED ORDER — ONDANSETRON HCL 4 MG/2ML IJ SOLN
4.0000 mg | Freq: Four times a day (QID) | INTRAMUSCULAR | Status: DC | PRN
  Administered 2016-02-11: 4 mg via INTRAVENOUS
  Filled 2016-02-11: qty 2

## 2016-02-11 MED ORDER — ONDANSETRON HCL 4 MG/2ML IJ SOLN
INTRAMUSCULAR | Status: AC
  Administered 2016-02-11: 4 mg via INTRAVENOUS
  Filled 2016-02-11: qty 2

## 2016-02-11 MED ORDER — SODIUM CHLORIDE 0.9% FLUSH
3.0000 mL | Freq: Two times a day (BID) | INTRAVENOUS | Status: DC
  Administered 2016-02-12 – 2016-02-13 (×3): 3 mL via INTRAVENOUS

## 2016-02-11 MED ORDER — POTASSIUM CHLORIDE 10 MEQ/100ML IV SOLN
10.0000 meq | INTRAVENOUS | Status: AC
  Administered 2016-02-11 (×2): 10 meq via INTRAVENOUS
  Filled 2016-02-11 (×2): qty 100

## 2016-02-11 MED ORDER — GI COCKTAIL ~~LOC~~
30.0000 mL | Freq: Once | ORAL | Status: AC
  Administered 2016-02-11: 30 mL via ORAL
  Filled 2016-02-11: qty 30

## 2016-02-11 MED ORDER — METOCLOPRAMIDE HCL 5 MG/ML IJ SOLN
10.0000 mg | Freq: Once | INTRAMUSCULAR | Status: AC
  Administered 2016-02-11: 10 mg via INTRAVENOUS
  Filled 2016-02-11: qty 2

## 2016-02-11 MED ORDER — SODIUM CHLORIDE 0.9 % IV BOLUS (SEPSIS)
1000.0000 mL | Freq: Once | INTRAVENOUS | Status: AC
  Administered 2016-02-11: 1000 mL via INTRAVENOUS

## 2016-02-11 NOTE — ED Provider Notes (Signed)
[facility] Emergency Department Provider Note  ____________________________________________   I have reviewed the triage vital signs and the nursing notes.   HISTORY  Chief Complaint Chest Pain    HPI Kathryn [name] is a 60 y.o. female presents today complaining of her evaluated as current chest pain. Patient smells mildly of alcohol. Does admit to drinking alcohol tonight. She states she had chest pain earlier tonight. She cannot exactly tell me how long its been there. Mostly on the right she states not the left. She states she is not short of breath at this time as she was short of breath and has to usually short of breath. She has chronic lower extremity edema does not feel that that is worse. History is very limited.Patient states that Pain worsens when I touch it. Nothing makes it better. Very vaguely described duration and intensity and characteristics.   Past Medical History  Diagnosis Date  . Asthma   . Sarcoidosis (HCC)   . Hypothyroidism   . Chronic disease anemia   . Hypertension   . Esophageal varices (HCC)     GR I on EGD by Dr Shelle Ironein 09/2014  . Portal hypertension (HCC)   . Alcoholic cirrhosis of liver (HCC)   . ETOH abuse     Patient Active Problem List   Diagnosis Date Noted  . Hematemesis 04/04/2015  . Cirrhosis (HCC) 04/04/2015  . Esophageal varices (HCC) 04/04/2015  . Hypothyroid 04/04/2015  . Hypertension 04/04/2015  . Chest pain 04/04/2015    Past Surgical History  Procedure Laterality Date  . Tubal ligation    . Esophagogastroduodenoscopy w/ banding  09/2014    Rein  . Esophagogastroduodenoscopy (egd) with propofol N/A 04/04/2015    Procedure: ESOPHAGOGASTRODUODENOSCOPY (EGD) WITH PROPOFOL;  Surgeon: Midge Miniumarren Wohl, MD;  Location: [facility] ENDOSCOPY;  Service: Endoscopy;  Laterality: N/A;  Dr Servando SnareWOHL prefers [phone]    Current Outpatient Rx  Name  Route  Sig  Dispense  Refill  . benzonatate (TESSALON PERLES) 100 MG capsule      Take  1 or 2 every 8 hours for cough as needed   40 capsule   0   . cloNIDine (CATAPRES) 0.1 MG tablet   Oral   Take 1 tablet by mouth at bedtime.         . fluticasone (FLONASE) 50 MCG/ACT nasal spray      2 sprays each nostril once a day   1 g   0   . levothyroxine (SYNTHROID, LEVOTHROID) 175 MCG tablet   Oral   Take 175 mcg by mouth daily before breakfast.          . nadolol (CORGARD) 80 MG tablet   Oral   Take 0.5 tablets (40 mg total) by mouth daily.   30 tablet   1   . pantoprazole (PROTONIX) 40 MG tablet   Oral   Take 1 tablet (40 mg total) by mouth 2 (two) times daily before a meal. Patient taking differently: Take 40 mg by mouth daily.    60 tablet   1   . spironolactone (ALDACTONE) 25 MG tablet   Oral   Take 1 tablet (25 mg total) by mouth daily.   30 tablet   0     Allergies Hydrocodone-acetaminophen; Ibuprofen; Naproxen; Propoxyphene; and Tamiflu   Family History  Problem Relation Age of Onset  . Aneurysm Father   . Heart attack Mother     Social History Social History  Substance Use Topics  .  Smoking status: Never Smoker   . Smokeless tobacco: None  . Alcohol Use: Yes     Comment: Quit ETOH- 04/04/15, Heavy Use prior to this    Review of Systems Constitutional: No fever/chills Eyes: No visual changes. ENT: No sore throat. No stiff neck no neck pain Cardiovascular: See history of present illness. Respiratory: Denies shortness of breath. Gastrointestinal:   no vomiting.  No diarrhea.  No constipation. Genitourinary: Negative for dysuria. Musculoskeletal: Negative lower extremity swelling Skin: Negative for rash. Neurological: Negative for headaches, focal weakness or numbness. 10-point ROS otherwise negative.  ____________________________________________   PHYSICAL EXAM:  VITAL SIGNS: ED Triage Vitals  Enc Vitals Group     BP 02/11/16 0550 143/79 mmHg     Pulse Rate 02/11/16 0550 76     Resp 02/11/16 0550 16     Temp 02/11/16  0550 98.6 F (37 C)     Temp Source 02/11/16 0550 Oral     SpO2 02/11/16 0550 100 %     Weight 02/11/16 0548 204 lb 12.9 oz (92.9 kg)     Height --      Head Cir --      Peak Flow --      Pain Score 02/11/16 0548 8     Pain Loc --      Pain Edu? --      Excl. in GC? --     Constitutional: Alert and Oriented but smells of alcohol and this very poor historian. Well appearing and in no acute distress. Eyes: Conjunctivae are normal. PERRL. EOMI. Head: Atraumatic. Nose: No congestion/rhinnorhea. Mouth/Throat: Mucous membranes are moist.  Oropharynx non-erythematous. Neck: No stridor.   Nontender with no meningismus Cardiovascular: Normal rate, regular rhythm. Grossly normal heart sounds.  Good peripheral circulation. Chest: Tender to palpation left chest wall and right chest wall. Her seizures patient states "ouch that's the pain right there" and pulls back. No flail chest. This very much reproduces her pain she states. Respiratory: Normal respiratory effort.  No retractions. Lungs CTAB. Abdominal: Soft and nontender. No distention. No guarding no rebound Back:  There is no focal tenderness or step off there is no midline tenderness there are no lesions noted. there is no CVA tenderness Musculoskeletal: No lower extremity tenderness. No joint effusions, no DVT signs strong distal pulses chronic symmetric bilateral edema Neurologic:  Normal speech and language. No gross focal neurologic deficits are appreciated.  Skin:  Skin is warm, dry and intact. No rash noted. Psychiatric: Mood and affect are normal. Speech and behavior are normal.  ____________________________________________   LABS (all labs ordered are listed, but only abnormal results are displayed)  Labs Reviewed  COMPREHENSIVE METABOLIC PANEL  ETHANOL  BRAIN NATRIURETIC PEPTIDE  TROPONIN I  CBC WITH DIFFERENTIAL/PLATELET   ____________________________________________  EKG  I personally interpreted any EKGs ordered by  me or triage Normal sinus rhythm rate 82 bpm no acute ST elevation or depression nonspecific ST changes. ____________________________________________  RADIOLOGY  I reviewed any imaging ordered by me or triage that were performed during my shift and, if possible, patient and/or family made aware of any abnormal findings. ____________________________________________   PROCEDURES  Procedure(s) performed: None  Critical Care performed: None  ____________________________________________   INITIAL IMPRESSION / ASSESSMENT AND PLAN / ED COURSE  Pertinent labs & imaging results that were available during my care of the patient were reviewed by me and considered in my medical decision making (see chart for details).  Patient with very reproducible chest wall pain  in a very poor history, does have a history however of CHF. Her abdomen is benign at this time. This is not appeared to be referred abdominal pain. Given her history we will obtain BNP chest x-ray basic blood work and reassess. ____________________________________________   FINAL CLINICAL IMPRESSION(S) / ED DIAGNOSES  Final diagnoses:  None      This chart was dictated using voice recognition software.  Despite best efforts to proofread,  errors can occur which can change meaning.     Jeanmarie Plant, MD 02/11/16 [phone]

## 2016-02-11 NOTE — ED Notes (Signed)
Pt transported to xray via stretcher

## 2016-02-11 NOTE — ED Provider Notes (Signed)
Signout from Dr. Alphonzo LemmingsMcShane in this patient with vague chest pain with intoxication. Follow-up with labs and reassess. Physical Exam  BP 163/91 mmHg  Pulse 81  Temp(Src) 98.6 F (37 C) (Oral)  Resp 12  Wt 204 lb 12.9 oz (92.9 kg)  SpO2 100% ----------------------------------------- 2:45 PM on 02/11/2016 -----------------------------------------   Physical Exam Patient resting without having complained at this time. No distress. ED Course  Procedures  MDM Patient with multiple Accu-Cheks in the emergency department which were hypoglycemic. Did have an initial glucose which was in the 30s. The patient was encouraged to eat and drink. She was able to tolerate a small amount of pain about her, graham crackers orange juice and applesauce. However, it has been very difficult to get the patient to eat even with encouragement. We have tried multiple times to have her take solids and liquids and despite this her glucose is persistently in the 50s. She has never had a depressed mental status. It is possible that her symptoms are compounded by her liver disease in the deficiency for glucose and agenesis. She'll need to be put on a dextrose infusion. She was explained that she will need to stay in hospital. Signed out to Dr. Hilton SinclairWeiting. She denies being a diabetic or being on any insulin or anti-hyperglycemics.       Myrna Blazeravid Matthew Schaevitz, MD 02/11/16 [phone]

## 2016-02-11 NOTE — ED Notes (Addendum)
Patient presents to the ED tonight via EMS from home. Patient with c/o retrosternal CP with (+) radiation into the LEFT side of her chest. (+) SOB and peripheral edema noted. Patient reports difficulty going to sleep - has been sitting up in the chair all night "afraid to sleep" - finally drifted off to sleep and woke up with chest pain. Patient was here on 3/15 for the same - Dx'd with heart failure and cardiomegly - see cardiology TODAY (3/21). EMS gave ASA 324 PO en route. (+) ETOH use tonight per EMS report.

## 2016-02-11 NOTE — ED Notes (Signed)
MD notified of blood sugar. Awaiting orders.  

## 2016-02-11 NOTE — ED Notes (Signed)
Pt has eaten half of a peanut butter cup, couple bites of graham crackers, and several sips of juice.  States feeling nauseous.  MD notified and VORB 10mg  reglan placed.

## 2016-02-11 NOTE — H&P (Addendum)
[facility] [place] [facility]   PATIENT NAME: Kathryn [name]    MR#:  [id number]  DATE OF BIRTH:  July 26, 1956  DATE OF ADMISSION:  02/11/2016  PRIMARY CARE PHYSICIAN: Presley Raddle, MD   REQUESTING/REFERRING PHYSICIAN: Dr. Loreli Dollar  CHIEF COMPLAINT:   Chief Complaint  Patient presents with  . Chest Pain    HISTORY OF PRESENT ILLNESS:  Kathryn [name]  is a 60 y.o. female with a known history of Cirrhosis of liver. Patient doesn't remember what happened. She doesn't feel good. In the ER, she was found to have repeatedly low sugars and she doesn't want to eat. She felt overall nauseous. Alcohol level was found to be 208 on admission.  PAST MEDICAL HISTORY:   Past Medical History  Diagnosis Date  . Asthma   . Sarcoidosis (HCC)   . Hypothyroidism   . Chronic disease anemia   . Hypertension   . Esophageal varices (HCC)     GR I on EGD by Dr Shelle Iron 09/2014  . Portal hypertension (HCC)   . Alcoholic cirrhosis of liver (HCC)   . ETOH abuse     PAST SURGICAL HISTORY:   Past Surgical History  Procedure Laterality Date  . Tubal ligation    . Esophagogastroduodenoscopy w/ banding  09/2014    Rein  . Esophagogastroduodenoscopy (egd) with propofol N/A 04/04/2015    Procedure: ESOPHAGOGASTRODUODENOSCOPY (EGD) WITH PROPOFOL;  Surgeon: Midge Minium, MD;  Location: [facility] ENDOSCOPY;  Service: Endoscopy;  Laterality: N/A;  Dr Servando Snare prefers 1445    SOCIAL HISTORY:   Social History  Substance Use Topics  . Smoking status: Never Smoker   . Smokeless tobacco: Not on file  . Alcohol Use: Yes     Comment: Quit ETOH- 04/04/15, Heavy Use prior to this    FAMILY HISTORY:   Family History  Problem Relation Age of Onset  . Aneurysm Father   . COPD Father   . Heart attack Mother     DRUG ALLERGIES:   Allergies  Allergen Reactions  . Hydrocodone-Acetaminophen Other (See Comments)    Reaction: pt can't take anything with tylenol due to her liver disease.    . Ibuprofen Other (See Comments)    Esophageal varices  . Naproxen Other (See Comments)    Esophageal varices  . Propoxyphene Other (See Comments)    Reaction: pt isn't sure, but knows she can't take it.   . Tamiflu  [Oseltamivir Phosphate] Other (See Comments)    Esophageal varices    REVIEW OF SYSTEMS:  CONSTITUTIONAL: No fever, fatigue or weakness. Positive chills EYES: No blurred or double vision. Seeing spots and does wear glasses. EARS, NOSE, AND THROAT: No tinnitus or ear pain. No sore throat. Positive runny nose. Some dysphasia RESPIRATORY: No cough, occasional shortness of breath, no wheezing or hemoptysis.  CARDIOVASCULAR: Some chest pain, no orthopnea, edema.  GASTROINTESTINAL: Positive nausea, no vomiting, diarrhea. Some epigastric abdominal pain. No blood in bowel movements GENITOURINARY: No dysuria, hematuria.  ENDOCRINE: No polyuria, nocturia,  HEMATOLOGY: History of anemia, no easy bruising or bleeding SKIN: No rash or lesion. Itching on her back. MUSCULOSKELETAL: No joint pain or arthritis.   NEUROLOGIC: No tingling, numbness, weakness.  PSYCHIATRY: No anxiety or some depression.   MEDICATIONS AT HOME:   Prior to Admission medications   Medication Sig Start Date End Date Taking? Authorizing Provider  benzonatate (TESSALON PERLES) 100 MG capsule Take 1 or 2 every 8 hours for cough as needed 07/10/15  Yes  Tommi Rumps, PA-C  cloNIDine (CATAPRES) 0.1 MG tablet Take 1 tablet by mouth at bedtime.   Yes Historical Provider, MD  fluticasone Aleda Grana) 50 MCG/ACT nasal spray 2 sprays each nostril once a day 07/10/15 02/11/16 Yes Tommi Rumps, PA-C  levothyroxine (SYNTHROID, LEVOTHROID) 175 MCG tablet Take 175 mcg by mouth daily before breakfast.    Yes Historical Provider, MD  nadolol (CORGARD) 80 MG tablet Take 0.5 tablets (40 mg total) by mouth daily. 04/06/15  Yes Altamese Dilling, MD  pantoprazole (PROTONIX) 40 MG tablet Take 1 tablet (40 mg total) by mouth 2  (two) times daily before a meal. Patient taking differently: Take 40 mg by mouth daily.  04/06/15  Yes Altamese Dilling, MD  spironolactone (ALDACTONE) 25 MG tablet Take 1 tablet (25 mg total) by mouth daily. 04/06/15  Yes Altamese Dilling, MD      VITAL SIGNS:  Blood pressure 163/91, pulse 81, temperature 98.6 F (37 C), temperature source Oral, resp. rate 12, weight 92.9 kg (204 lb 12.9 oz), SpO2 100 %.  PHYSICAL EXAMINATION:  GENERAL:  60 y.o.-year-old patient lying in the bed with no acute distress.  EYES: Pupils equal, round, reactive to light and accommodation. No scleral icterus. Extraocular muscles intact.  HEENT: Head atraumatic, normocephalic. Oropharynx and nasopharynx clear.  NECK:  Supple, no jugular venous distention. No thyroid enlargement, no tenderness.  LUNGS: Normal breath sounds bilaterally, no wheezing, rales,rhonchi or crepitation. No use of accessory muscles of respiration.  CARDIOVASCULAR: S1, S2 normal. No murmurs, rubs, or gallops.  ABDOMEN: Soft, nontender, nondistended. Bowel sounds present. No organomegaly or mass.  EXTREMITIES: No pedal edema, cyanosis, or clubbing.  NEUROLOGIC: Cranial nerves II through XII are intact. Muscle strength 5/5 in all extremities. Sensation intact. Gait not checked.  PSYCHIATRIC: The patient is alert and oriented x 3.  SKIN: No rash, lesion, or ulcer.   LABORATORY PANEL:   CBC  Recent Labs Lab 02/11/16 0704  WBC 4.3  HGB 7.9*  HCT 25.2*  PLT 260   ------------------------------------------------------------------------------------------------------------------  Chemistries   Recent Labs Lab 02/11/16 0612 02/11/16 1052  NA 136 139  K 3.8 3.2*  CL 103 110  CO2 13* 12*  GLUCOSE 32* 51*  BUN 18 18  CREATININE 1.77* 1.45*  CALCIUM 8.5* 8.1*  AST 92*  --   ALT 20  --   ALKPHOS 87  --   BILITOT 1.0  --     ------------------------------------------------------------------------------------------------------------------  Cardiac Enzymes  Recent Labs Lab 02/11/16 0612  TROPONINI <0.03   ------------------------------------------------------------------------------------------------------------------  RADIOLOGY:  Dg Chest 2 View  02/11/2016  CLINICAL DATA:  Shortness of breath and chest pain EXAM: CHEST  2 VIEW COMPARISON:  Chest radiograph January 21, 2016 and chest CT February 05, 2016 FINDINGS: There is no edema or consolidation. Heart is borderline enlarged with pulmonary vascularity within normal limits. No adenopathy. No bone lesions. No pneumothorax. IMPRESSION: Borderline cardiac enlargement.  No edema or consolidation. Electronically Signed   By: Bretta Bang III M.D.   On: 02/11/2016 07:08    EKG:   Normal sinus rhythm 85 bpm, low voltage, elevated QTC.  IMPRESSION AND PLAN:   1. Hypoglycemia and not wanting to eat. Patient was placed on a D5 drip will check fingersticks every hour. Likely this is secondary to cirrhosis and not eating. I will send an insulin C-peptide sulfonylurea screen and beta hydroxybutyric acid. 2. Epigastric abdominal pain and chest pain. Patient is a history of alcohol cirrhosis and esophageal varices and portal gastropathy.  I will give IV Protonix and a GI cocktail and monitor for signs of bleeding. 3. Anemia. This is chronic looking back at prior labs. Check ferritin and guaiac stools 4. Chronic kidney disease stage III continue to monitor with IV fluid hydration. 5. Hypokalemia. Replace potassium 6. History of cirrhosis 7. History of sarcoidosis  8. Hypothyroidism unspecified on levothyroxine 1. Accelerated hypertension continue her usual medications for now and monitor.   All the records are reviewed and case discussed with ED provider. Management plans discussed with the patient, family and they are in agreement.  CODE STATUS: Full  code  TOTAL TIME TAKING CARE OF THIS PATIENT: 50 minutes.    Alford HighlandWIETING, Leia Coletti M.D on 02/11/2016 at 3:11 PM  Between 7am to 6pm - Pager - [phone]  After 6pm call admission pager [phone]  [facility]  Office  [phone]  CC: Primary care physician; Presley RaddleAdrian Mancheno, MD

## 2016-02-11 NOTE — ED Notes (Signed)
Glucose of 32 called by lab, Dr. Pershing ProudSchaevitz notified, pt given juice and crackers and peanut butter per MD

## 2016-02-11 NOTE — Progress Notes (Signed)
Troponin level is 0.05. MD notified.

## 2016-02-11 NOTE — ED Notes (Signed)
Blood sugar checked 62; pt given more juice and encouraged to eat crackers

## 2016-02-12 LAB — C-PEPTIDE: C-Peptide: 1.2 ng/mL (ref 1.1–4.4)

## 2016-02-12 LAB — URINALYSIS COMPLETE WITH MICROSCOPIC (ARMC ONLY)
Bilirubin Urine: NEGATIVE
GLUCOSE, UA: NEGATIVE mg/dL
Hgb urine dipstick: NEGATIVE
Ketones, ur: NEGATIVE mg/dL
Leukocytes, UA: NEGATIVE
Nitrite: NEGATIVE
Protein, ur: NEGATIVE mg/dL
Specific Gravity, Urine: 1.009 (ref 1.005–1.030)
pH: 6 (ref 5.0–8.0)

## 2016-02-12 LAB — GLUCOSE, CAPILLARY
GLUCOSE-CAPILLARY: 121 mg/dL — AB (ref 65–99)
GLUCOSE-CAPILLARY: 128 mg/dL — AB (ref 65–99)
GLUCOSE-CAPILLARY: 137 mg/dL — AB (ref 65–99)
GLUCOSE-CAPILLARY: 147 mg/dL — AB (ref 65–99)
GLUCOSE-CAPILLARY: 155 mg/dL — AB (ref 65–99)
GLUCOSE-CAPILLARY: 174 mg/dL — AB (ref 65–99)
GLUCOSE-CAPILLARY: 195 mg/dL — AB (ref 65–99)
Glucose-Capillary: 131 mg/dL — ABNORMAL HIGH (ref 65–99)
Glucose-Capillary: 119 mg/dL — ABNORMAL HIGH (ref 65–99)
Glucose-Capillary: 147 mg/dL — ABNORMAL HIGH (ref 65–99)
Glucose-Capillary: 131 mg/dL — ABNORMAL HIGH (ref 65–99)
Glucose-Capillary: 126 mg/dL — ABNORMAL HIGH (ref 65–99)
Glucose-Capillary: 135 mg/dL — ABNORMAL HIGH (ref 65–99)

## 2016-02-12 LAB — CBC
HCT: 23.4 % — ABNORMAL LOW (ref 35.0–47.0)
Hemoglobin: 7.5 g/dL — ABNORMAL LOW (ref 12.0–16.0)
MCH: 24.8 pg — AB (ref 26.0–34.0)
MCHC: 31.9 g/dL — ABNORMAL LOW (ref 32.0–36.0)
MCV: 77.7 fL — AB (ref 80.0–100.0)
PLATELETS: 278 10*3/uL (ref 150–440)
RBC: 3.01 MIL/uL — ABNORMAL LOW (ref 3.80–5.20)
RDW: 23.4 % — AB (ref 11.5–14.5)
WBC: 3.3 10*3/uL — AB (ref 3.6–11.0)

## 2016-02-12 LAB — BASIC METABOLIC PANEL
Anion gap: 9 (ref 5–15)
BUN: 20 mg/dL (ref 6–20)
CALCIUM: 8.4 mg/dL — AB (ref 8.9–10.3)
CHLORIDE: 102 mmol/L (ref 101–111)
CO2: 22 mmol/L (ref 22–32)
CREATININE: 1.52 mg/dL — AB (ref 0.44–1.00)
GFR calc non Af Amer: 36 mL/min — ABNORMAL LOW (ref >60)
GFR, EST AFRICAN AMERICAN: 42 mL/min — AB (ref >60)
Glucose, Bld: 123 mg/dL — ABNORMAL HIGH (ref 65–99)
Potassium: 4.3 mmol/L (ref 3.5–5.1)
SODIUM: 133 mmol/L — AB (ref 135–145)

## 2016-02-12 LAB — TROPONIN I
TROPONIN I: 0.04 ng/mL — AB (ref <0.031)
Troponin I: 0.03 ng/mL (ref <0.031)
Troponin I: 0.05 ng/mL — ABNORMAL HIGH (ref <0.031)

## 2016-02-12 LAB — INSULIN, RANDOM: INSULIN: 4.2 u[IU]/mL (ref 2.6–24.9)

## 2016-02-12 NOTE — Care Management Obs Status (Signed)
MEDICARE OBSERVATION STATUS NOTIFICATION   Patient Details  Name: Kathryn [name] MRN: [id number] Date of Birth: 07/01/1956   Medicare Observation Status Notification Given:  Yes    Gwenette GreetBrenda S Izac Faulkenberry, RN 02/12/2016, 8:24 AM

## 2016-02-12 NOTE — Progress Notes (Signed)
[facility] [place] at [facility]   PATIENT NAME: Kathryn IvanoffKathy [name]    MR#:  [id number]  DATE OF BIRTH:  01/16/1956  SUBJECTIVE:  CHIEF COMPLAINT:   Chief Complaint  Patient presents with  . Chest Pain    Came with hypoglycemia, also had alcoholic ketoacidosis. On Dextrose drip, blood sugar improved, and feels better, eating fine. Could not walk properly with nurse.  REVIEW OF SYSTEMS:  CONSTITUTIONAL: No fever,positive for fatigue or weakness.  EYES: No blurred or double vision.  EARS, NOSE, AND THROAT: No tinnitus or ear pain.  RESPIRATORY: No cough, shortness of breath, wheezing or hemoptysis.  CARDIOVASCULAR: No chest pain, orthopnea, edema.  GASTROINTESTINAL: No nausea, vomiting, diarrhea or abdominal pain.  GENITOURINARY: No dysuria, hematuria.  ENDOCRINE: No polyuria, nocturia,  HEMATOLOGY: No anemia, easy bruising or bleeding SKIN: No rash or lesion. MUSCULOSKELETAL: No joint pain or arthritis.   NEUROLOGIC: No tingling, numbness, weakness.  PSYCHIATRY: No anxiety or depression.   ROS  DRUG ALLERGIES:   Allergies  Allergen Reactions  . Hydrocodone-Acetaminophen Other (See Comments)    Reaction: pt can't take anything with tylenol due to her liver disease.   . Ibuprofen Other (See Comments)    Esophageal varices  . Naproxen Other (See Comments)    Esophageal varices  . Propoxyphene Other (See Comments)    Reaction: pt isn't sure, but knows she can't take it.   . Tamiflu  [Oseltamivir Phosphate] Other (See Comments)    Esophageal varices    VITALS:  Blood pressure 149/90, pulse 59, temperature 98 F (36.7 C), temperature source Oral, resp. rate 18, height 5\' 4"  (1.626 m), weight 89.495 kg (197 lb 4.8 oz), SpO2 100 %.  PHYSICAL EXAMINATION:  GENERAL:  60 y.o.-year-old patient lying in the bed with no acute distress.  EYES: Pupils equal, round, reactive to light and accommodation. No scleral icterus. Extraocular muscles intact.  HEENT:  Head atraumatic, normocephalic. Oropharynx and nasopharynx clear.  NECK:  Supple, no jugular venous distention. No thyroid enlargement, no tenderness.  LUNGS: Normal breath sounds bilaterally, no wheezing, rales,rhonchi or crepitation. No use of accessory muscles of respiration.  CARDIOVASCULAR: S1, S2 normal. No murmurs, rubs, or gallops.  ABDOMEN: Soft, nontender, nondistended. Bowel sounds present. No organomegaly or mass.  EXTREMITIES: No pedal edema, cyanosis, or clubbing.  NEUROLOGIC: Cranial nerves II through XII are intact. Muscle strength 4/5 in all extremities. Sensation intact. Gait not checked.  PSYCHIATRIC: The patient is alert and oriented x 3.  SKIN: No obvious rash, lesion, or ulcer.   Physical Exam LABORATORY PANEL:   CBC  Recent Labs Lab 02/12/16 0350  WBC 3.3*  HGB 7.5*  HCT 23.4*  PLT 278   ------------------------------------------------------------------------------------------------------------------  Chemistries   Recent Labs Lab 02/11/16 0612  02/12/16 0350  NA 136  < > 133*  K 3.8  < > 4.3  CL 103  < > 102  CO2 13*  < > 22  GLUCOSE 32*  < > 123*  BUN 18  < > 20  CREATININE 1.77*  < > 1.52*  CALCIUM 8.5*  < > 8.4*  AST 92*  --   --   ALT 20  --   --   ALKPHOS 87  --   --   BILITOT 1.0  --   --   < > = values in this interval not displayed. ------------------------------------------------------------------------------------------------------------------  Cardiac Enzymes  Recent Labs Lab 02/12/16 0941 02/12/16 1343  TROPONINI 0.03 0.05*   ------------------------------------------------------------------------------------------------------------------  RADIOLOGY:  Dg Chest 2 View  02/11/2016  CLINICAL DATA:  Shortness of breath and chest pain EXAM: CHEST  2 VIEW COMPARISON:  Chest radiograph January 21, 2016 and chest CT February 05, 2016 FINDINGS: There is no edema or consolidation. Heart is borderline enlarged with pulmonary vascularity  within normal limits. No adenopathy. No bone lesions. No pneumothorax. IMPRESSION: Borderline cardiac enlargement.  No edema or consolidation. Electronically Signed   By: Bretta Bang III M.D.   On: 02/11/2016 07:08    ASSESSMENT AND PLAN:   Active Problems:   Hypoglycemia  1. Hypoglycemia and not wanting to eat. Patient was placed on a D5 drip   Likely this is secondary to cirrhosis and not eating. normal insulin C-peptide  Blood sugar is stable now, off the drip.  2. Epigastric abdominal pain and chest pain. Patient is a history of alcohol cirrhosis and esophageal varices and portal gastropathy.  give IV Protonix and a GI cocktail and monitor for signs of bleeding.   Stable, tolerated diet.  3. Anemia. This is chronic looking back at prior labs. Check ferritin and guaiac stools 4. Chronic kidney disease stage III continue to monitor with IV fluid hydration. 5. Hypokalemia. Replace potassium 6. History of cirrhosis 7. History of sarcoidosis  8. Hypothyroidism unspecified on levothyroxine 9. Accelerated hypertension continue her usual medications for now and monitor.   BP stable. 10. Generalized weakness      As per nurse, she could not walk properly out of the room, will call PT eval.     Check UA.    All the records are reviewed and case discussed with Care Management/Social Workerr. Management plans discussed with the patient, family and they are in agreement.  CODE STATUS: Full.  TOTAL TIME TAKING CARE OF THIS PATIENT: 35 minutes.   POSSIBLE D/C IN 1-2 DAYS, DEPENDING ON CLINICAL CONDITION.   Altamese Dilling M.D on 02/12/2016   Between 7am to 6pm - Pager - [phone]  After 6pm go to [email or web address] - password [email or web address]  [facility]  Office  [phone]  CC: Primary care physician; Presley Raddle, MD  Note: This dictation was prepared with Dragon dictation along with smaller phrase technology. Any transcriptional errors that  result from this process are unintentional.

## 2016-02-12 NOTE — Care Management (Signed)
Admitted to this facility with the diagnosis of hypoglycemia. [name] has 2 grandchildren that live with her in the home (Ages 7115 & [address]). Sister is Claudene Goldammer [phone]). No home health. No skilled facility.No home oxygen. Appointment with Dr. Barbette HairManchero for next Friday at [facility]. Takes care of all basic activities of daily living herself, doesn't drive. Sister helps with errands. Last fall was May 2016. Good appetite. No seizure activity. Sister will transport. Gwenette GreetBrenda S Donnalee Cellucci RN MSN CCM Care Management [phone]

## 2016-02-13 LAB — GLUCOSE, CAPILLARY
GLUCOSE-CAPILLARY: 97 mg/dL (ref 65–99)
GLUCOSE-CAPILLARY: 96 mg/dL (ref 65–99)
Glucose-Capillary: 113 mg/dL — ABNORMAL HIGH (ref 65–99)
Glucose-Capillary: 115 mg/dL — ABNORMAL HIGH (ref 65–99)
Glucose-Capillary: 84 mg/dL (ref 65–99)

## 2016-02-13 MED ORDER — BISACODYL 10 MG RE SUPP: 10.0000 mg | Freq: Once | RECTAL | Status: DC

## 2016-02-13 MED ORDER — PANTOPRAZOLE SODIUM 40 MG PO TBEC
40.0000 mg | DELAYED_RELEASE_TABLET | Freq: Two times a day (BID) | ORAL | Status: DC
  Administered 2016-02-13: 40 mg via ORAL
  Filled 2016-02-13: qty 1

## 2016-02-13 MED ORDER — BISACODYL 5 MG PO TBEC: 10.0000 mg | DELAYED_RELEASE_TABLET | Freq: Once | ORAL | Status: DC

## 2016-02-13 NOTE — Progress Notes (Signed)
Patient had no c/o pain this shift, VSS  Received MD order to discharge patient to home, reviewed discharge instructions and follow up appoints and home medications with daughter and she verbalized understanding patient discharged to home with daughter in wheelchair

## 2016-02-13 NOTE — Progress Notes (Signed)
IV to Oral Route Change Policy  RECOMMENDATION: This patient is receiving pantoprazole by the intravenous route.  Based on criteria approved by the Pharmacy and Therapeutics Committee, the intravenous medication(s) is/are being converted to the equivalent oral dose form(s).   DESCRIPTION: These criteria include:  The patient is eating (either orally or via tube) and/or has been taking other orally administered medications for a least 24 hours  The patient has no evidence of active gastrointestinal bleeding or impaired GI absorption (gastrectomy, short bowel, patient on TNA or NPO).  If you have questions about this conversion, please contact the Pharmacy Department   Martyn MalayBarefoot,Basil Blakesley C, [facility] 02/13/2016 7:52 AM

## 2016-02-13 NOTE — Care Management (Signed)
Discharge to home today per Dr. Elisabeth PigeonVachhani.  Physical therapy evaluation completed.  Discussed home health services in the home. Chose [facility] for nursing services. Feliberto GottronJason Hinton, representative for [facility] updated. Rolling walker will be arranged through [facility]. Will Anderson, representative updated. Sister will transport. Ms. Tango states she is moving this weekend. Encourage her to give the hospital her new address prior to discharge today, Gwenette GreetBrenda S Mekaela Azizi RN MSN CCM Care Management [phone]

## 2016-02-13 NOTE — Discharge Instructions (Addendum)
°  Document Released: 09/03/2005 Document Revised: 11/30/2014 Document Reviewed: 06/12/2014 Elsevier Interactive Patient Education [employer]2016 Elsevier Inc.

## 2016-02-13 NOTE — Evaluation (Addendum)
Physical Therapy Evaluation Patient Details Name: Kathryn MinerKathy B Iwai MRN: [id number] DOB: 02/05/1956 Today's Date: 02/13/2016   History of Present Illness  Pt admitted for hypoglycemia. Pt with complaints of chest pain upon admission. Pt with history of liver cirrhosis, asthma, HTN, and ETOH abuse.  Clinical Impression  Pt is a pleasant 60 year old female who was admitted for hypoglycemia. Pt performs bed mobility with independence, transfers with cga, and ambulation with cga and rw. Pt demonstrates deficits with endurance/balance/strength/mobility. Would benefit from skilled PT to address above deficits and promote optimal return to PLOF. Pt with unsteadiness during mobility, needs to use RW for all mobility to reduce risk of falls. Pt with increased time in 5 time sit<>stand indicating pt at higher risk for falls.       Follow Up Recommendations Home health PT    Equipment Recommendations  Rolling walker with 5" wheels    Recommendations for Other Services       Precautions / Restrictions Precautions Precautions: Fall Restrictions Weight Bearing Restrictions: No      Mobility  Bed Mobility Overal bed mobility: Independent             General bed mobility comments: safe technique performed  Transfers Overall transfer level: Needs assistance Equipment used: Rolling walker (2 wheeled) Transfers: Sit to/from Stand Sit to Stand: Min guard         General transfer comment: safe technique using rw. Pt with slight unsteadiness upon standing, however no formal LOB noted  Ambulation/Gait Ambulation/Gait assistance: Min guard Ambulation Distance (Feet): 50 Feet Assistive device: Rolling walker (2 wheeled) Gait Pattern/deviations: Step-to pattern     General Gait Details: ambulated using rw and reciprocal gait pattern. Safe technique performed, however pt fatigues quickly. Heavy reliance needed on rw secondary to fatigue and balance deficits.  Stairs             Wheelchair Mobility    Modified Rankin (Stroke Patients Only)       Balance Overall balance assessment: Needs assistance Sitting-balance support: No upper extremity supported;Feet supported Sitting balance-Leahy Scale: Good     Standing balance support: Bilateral upper extremity supported Standing balance-Leahy Scale: Fair                               Pertinent Vitals/Pain Pain Assessment: No/denies pain    Home Living Family/patient expects to be discharged to:: Private residence Living Arrangements: Children Available Help at Discharge: Family Type of Home: House Home Access: Stairs to enter Entrance Stairs-Rails: Right Entrance Stairs-Number of Steps: 6 Home Layout: One level Home Equipment: None      Prior Function Level of Independence: Independent               Hand Dominance        Extremity/Trunk Assessment   Upper Extremity Assessment: Generalized weakness (grossly 3+/5)           Lower Extremity Assessment: Generalized weakness (grossly 4/5)         Communication   Communication: No difficulties  Cognition Arousal/Alertness: Awake/alert Behavior During Therapy: WFL for tasks assessed/performed Overall Cognitive Status: Within Functional Limits for tasks assessed                      General Comments General comments (skin integrity, edema, etc.): 5 time sit<>stand test performed in 27 seconds demonstrating slow technique. Pt needs B UE to push from bed to reach full  upright posture.     Exercises Other Exercises Other Exercises: Supine ther-ex performed including B LE ankle pumps, quad sets, hip add squeezes, SAQ and SLRs. All ther-ex performed x 10 reps with cga with safe technique performed.      Assessment/Plan    PT Assessment Patient needs continued PT services  PT Diagnosis Difficulty walking   PT Problem List Decreased strength;Decreased balance;Decreased mobility;Decreased knowledge of use of  DME  PT Treatment Interventions Gait training;Therapeutic exercise   PT Goals (Current goals can be found in the Care Plan section) Acute Rehab PT Goals Patient Stated Goal: to go home PT Goal Formulation: With patient Time For Goal Achievement: 02/27/16 Potential to Achieve Goals: Good    Frequency Min 2X/week   Barriers to discharge        Co-evaluation               End of Session Equipment Utilized During Treatment: Gait belt Activity Tolerance: Patient tolerated treatment well Patient left: in chair;with chair alarm set Nurse Communication: Mobility status    Functional Assessment Tool Used: 5 time sit<>Stand Functional Limitation: Mobility: Walking and moving around Mobility: Walking and Moving Around Current Status [phone]): At least 20 percent but less than 40 percent impaired, limited or restricted Mobility: Walking and Moving Around Goal Status [phone]): At least 1 percent but less than 20 percent impaired, limited or restricted    Time: 0837-0900 PT Time Calculation (min) (ACUTE ONLY): 23 min   Charges:   PT Evaluation $PT Eval Moderate Complexity: 1 Procedure PT Treatments $Therapeutic Exercise: 8-22 mins   PT G Codes:   PT G-Codes **NOT FOR INPATIENT CLASS** Functional Assessment Tool Used: 5 time sit<>Stand Functional Limitation: Mobility: Walking and moving around Mobility: Walking and Moving Around Current Status ([id number]): At least 20 percent but less than 40 percent impaired, limited or restricted Mobility: Walking and Moving Around Goal Status [phone]): At least 1 percent but less than 20 percent impaired, limited or restricted    Cherylene Ferrufino 02/13/2016, 11:55 AM Elizabeth Palau, PT, DPT [phone]

## 2016-02-13 NOTE — Progress Notes (Signed)
Pt is having symptoms of forgetfulness, and other memory issues and dicision making for last few months. ( as per her daughter) I am referring her to neurologist office to establish her diagnosis.  Meanwhile, I will say she will have to ask her PMD to help establish her capacity to make decisions.  Please contact her PMD and nurologist for further questions.

## 2016-02-17 NOTE — Discharge Summary (Signed)
[facility] - [place] at [facility]   PATIENT NAME: Kathryn IvanoffKathy [name]    MR#:  [id number]  DATE OF BIRTH:  10/04/1956  DATE OF ADMISSION:  02/11/2016 ADMITTING PHYSICIAN: Kathryn Highlandichard Wieting, MD  DATE OF DISCHARGE: 02/13/2016  6:30 PM  PRIMARY CARE PHYSICIAN: Kathryn RaddleAdrian Mancheno, MD    ADMISSION DIAGNOSIS:  Hypoglycemia [E16.2] Alcohol intoxication, uncomplicated (HCC) [F10.120] Chest pain, unspecified chest pain type [R07.9]  DISCHARGE DIAGNOSIS:  Active Problems:   Hypoglycemia  CKD   Anemia chronic.     SECONDARY DIAGNOSIS:   Past Medical History  Diagnosis Date  . Asthma   . Sarcoidosis (HCC)   . Hypothyroidism   . Chronic disease anemia   . Hypertension   . Esophageal varices (HCC)     GR I on EGD by Dr Kathryn Ironein 09/2014  . Portal hypertension (HCC)   . Alcoholic cirrhosis of liver (HCC)   . ETOH abuse     HOSPITAL COURSE:   1. Hypoglycemia and not wanting to eat. Patient was placed on a D5 drip  Likely this is secondary to cirrhosis and not eating. normal insulin C-peptide  Blood sugar is stable now, off the drip.  2. Epigastric abdominal pain and chest pain. Patient is a history of alcohol cirrhosis and esophageal varices and portal gastropathy. give IV Protonix and a GI cocktail and monitor for signs of bleeding.  Stable, tolerated diet.  3. Anemia. This is chronic looking back at prior labs. Check ferritin and guaiac stools 4. Chronic kidney disease stage III continue to monitor with IV fluid hydration. 5. Hypokalemia. Replace potassium 6. History of cirrhosis 7. History of sarcoidosis  8. Hypothyroidism unspecified on levothyroxine 9. Accelerated hypertension continue her usual medications for now and monitor.  BP stable. 10. Generalized weakness  As per nurse, she could not walk properly out of the room, Done PT eval.  Checked UA.  DISCHARGE CONDITIONS:   Stable.  CONSULTS OBTAINED:     DRUG ALLERGIES:   Allergies   Allergen Reactions  . Hydrocodone-Acetaminophen Other (See Comments)    Reaction: pt can't take anything with tylenol due to her liver disease.   . Ibuprofen Other (See Comments)    Esophageal varices  . Naproxen Other (See Comments)    Esophageal varices  . Propoxyphene Other (See Comments)    Reaction: pt isn't sure, but knows she can't take it.   . Tamiflu  [Oseltamivir Phosphate] Other (See Comments)    Esophageal varices    DISCHARGE MEDICATIONS:   Discharge Medication List as of 02/13/2016  5:59 PM    CONTINUE these medications which have NOT CHANGED   Details  benzonatate (TESSALON PERLES) 100 MG capsule Take 1 or 2 every 8 hours for cough as needed, Print    cloNIDine (CATAPRES) 0.1 MG tablet Take 1 tablet by mouth at bedtime., Until Discontinued, Historical Med    fluticasone (FLONASE) 50 MCG/ACT nasal spray 2 sprays each nostril once a day, Print    levothyroxine (SYNTHROID, LEVOTHROID) 175 MCG tablet Take 175 mcg by mouth daily before breakfast. , Until Discontinued, Historical Med    nadolol (CORGARD) 80 MG tablet Take 0.5 tablets (40 mg total) by mouth daily., Starting 04/06/2015, Until Discontinued, Print    pantoprazole (PROTONIX) 40 MG tablet Take 1 tablet (40 mg total) by mouth 2 (two) times daily before a meal., Starting 04/06/2015, Until Discontinued, Print    spironolactone (ALDACTONE) 25 MG tablet Take 1 tablet (25 mg total) by mouth daily., Starting 04/06/2015,  Until Discontinued, Print         DISCHARGE INSTRUCTIONS:  Follow with PMD in 2 weeks.   If you experience worsening of your admission symptoms, develop shortness of breath, life threatening emergency, suicidal or homicidal thoughts you must seek medical attention immediately by calling 911 or calling your MD immediately  if symptoms less severe.  You Must read complete instructions/literature along with all the possible adverse reactions/side effects for all the Medicines you take and that have  been prescribed to you. Take any new Medicines after you have completely understood and accept all the possible adverse reactions/side effects.   Please note  You were cared for by a hospitalist during your hospital stay. If you have any questions about your discharge medications or the care you received while you were in the hospital after you are discharged, you can call the unit and asked to speak with the hospitalist on call if the hospitalist that took care of you is not available. Once you are discharged, your primary care physician will handle any further medical issues. Please note that NO REFILLS for any discharge medications will be authorized once you are discharged, as it is imperative that you return to your primary care physician (or establish a relationship with a primary care physician if you do not have one) for your aftercare needs so that they can reassess your need for medications and monitor your lab values.    Today   CHIEF COMPLAINT:   Chief Complaint  Patient presents with  . Chest Pain    HISTORY OF PRESENT ILLNESS:  Kathryn [name]  is a 60 y.o. female with a known history of Cirrhosis of liver. Patient doesn't remember what happened. She doesn't feel good. In the ER, she was found to have repeatedly low sugars and she doesn't want to eat. She felt overall nauseous. Alcohol level was found to be 208 on admission.  VITAL SIGNS:  Blood pressure 148/94, pulse 60, temperature 98 F (36.7 C), temperature source Oral, resp. rate 18, height  (1.626 m), weight 89.495 kg (197 lb 4.8 oz), SpO2 100 %.  I/O:  No intake or output data in the 24 hours ending 02/17/16 1009  PHYSICAL EXAMINATION:   GENERAL: 60 y.o.-year-old patient lying in the bed with no acute distress.  EYES: Pupils equal, round, reactive to light and accommodation. No scleral icterus. Extraocular muscles intact.  HEENT: Head atraumatic, normocephalic. Oropharynx and nasopharynx clear.  NECK:  Supple, no jugular venous distention. No thyroid enlargement, no tenderness.  LUNGS: Normal breath sounds bilaterally, no wheezing, rales,rhonchi or crepitation. No use of accessory muscles of respiration.  CARDIOVASCULAR: S1, S2 normal. No murmurs, rubs, or gallops.  ABDOMEN: Soft, nontender, nondistended. Bowel sounds present. No organomegaly or mass.  EXTREMITIES: No pedal edema, cyanosis, or clubbing.  NEUROLOGIC: Cranial nerves II through XII are intact. Muscle strength 4/5 in all extremities. Sensation intact. Gait not checked.  PSYCHIATRIC: The patient is alert and oriented x 3.  SKIN: No obvious rash, lesion, or ulcer  DATA REVIEW:   CBC  Recent Labs Lab 02/12/16 0350  WBC 3.3*  HGB 7.5*  HCT 23.4*  PLT 278    Chemistries   Recent Labs Lab 02/11/16 0612  02/12/16 0350  NA 136  < > 133*  K 3.8  < > 4.3  CL 103  < > 102  CO2 13*  < > 22  GLUCOSE 32*  < > 123*  BUN 18  < > 20  CREATININE 1.77*  < > 1.52*  CALCIUM 8.5*  < > 8.4*  AST 92*  --   --   ALT 20  --   --   ALKPHOS 87  --   --   BILITOT 1.0  --   --   < > = values in this interval not displayed.  Cardiac Enzymes  Recent Labs Lab 02/12/16 1343  TROPONINI 0.05*    Microbiology Results  Results for orders placed or performed in visit on 09/25/14  Culture, blood (single)     Status: None   Collection Time: 09/25/14  3:26 PM  Result Value Ref Range Status   Micro Text Report   Final       COMMENT                   NO GROWTH AEROBICALLY/ANAEROBICALLY IN 5 DAYS   ANTIBIOTIC                                                      Culture, blood (single)     Status: None   Collection Time: 09/25/14  3:49 PM  Result Value Ref Range Status   Micro Text Report   Final       COMMENT                   NO GROWTH AEROBICALLY/ANAEROBICALLY IN 5 DAYS   ANTIBIOTIC                                                      Urine culture     Status: None   Collection Time: 09/25/14  4:10 PM  Result  Value Ref Range Status   Micro Text Report   Final       SOURCE: CLEAN CATCH    COMMENT                   NO GROWTH IN 36 HOURS   ANTIBIOTIC                                                        RADIOLOGY:  No results found.  EKG:   Orders placed or performed during the hospital encounter of 02/11/16  . EKG 12-Lead  . EKG 12-Lead  . ED EKG  . ED EKG  . EKG 12-Lead  . EKG 12-Lead      Management plans discussed with the patient, family and they are in agreement.  CODE STATUS:  Code Status History    Date Active Date Inactive Code Status Order ID Comments User Context   02/11/2016  3:08 PM 02/13/2016 10:14 PM Full Code [id number]  Kathryn Highland, MD ED   04/04/2015  5:48 AM 04/06/2015  5:51 PM Full Code [id number]  Oralia Manis, MD Inpatient      TOTAL TIME TAKING CARE OF THIS PATIENT: 35 minutes.    Altamese Dilling M.D on 02/17/2016 at 10:09 AM  Between 7am to 6pm - Pager - [phone]  After 6pm go to [email or web address] - password [email or web address]  [facility]  Office  [phone]  CC: Primary care physician; Kathryn Raddle, MD   Note: This dictation was prepared with Dragon dictation along with smaller phrase technology. Any transcriptional errors that result from this process are unintentional.

## 2016-02-18 LAB — SULFONYLUREA HYPOGLYCEMICS PANEL, SERUM
Acetohexamide: NEGATIVE ug/mL (ref 20–60)
CHLORPROPAMIDE: NEGATIVE ug/mL (ref 75–250)
GLYBURIDE: NEGATIVE ng/mL
Glimepiride: NEGATIVE ng/mL (ref 80–250)
Glipizide: NEGATIVE ng/mL (ref 200–1000)
NATEGLINIDE: NEGATIVE ng/mL
REPAGLINIDE: NEGATIVE ng/mL
Tolazamide: NEGATIVE ug/mL
Tolbutamide: NEGATIVE ug/mL (ref 40–100)

## 2016-06-12 ENCOUNTER — Inpatient Hospital Stay
Admission: EM | Admit: 2016-06-12 | Discharge: 2016-06-15 | DRG: 378 | Disposition: A | Discharge status: Home / Self Care | Payer: Medicare Other | Attending: Internal Medicine | Admitting: Internal Medicine

## 2016-06-12 ENCOUNTER — Emergency Department: Payer: Medicare Other

## 2016-06-12 ENCOUNTER — Encounter: Payer: Self-pay | Admitting: Emergency Medicine

## 2016-06-12 DIAGNOSIS — K922 Gastrointestinal hemorrhage, unspecified: Principal | ICD-10-CM | POA: Diagnosis present

## 2016-06-12 DIAGNOSIS — D649 Anemia, unspecified: Secondary | ICD-10-CM | POA: Diagnosis present

## 2016-06-12 DIAGNOSIS — K7030 Alcoholic cirrhosis of liver without ascites: Secondary | ICD-10-CM | POA: Diagnosis present

## 2016-06-12 DIAGNOSIS — F1010 Alcohol abuse, uncomplicated: Secondary | ICD-10-CM | POA: Diagnosis present

## 2016-06-12 DIAGNOSIS — I509 Heart failure, unspecified: Secondary | ICD-10-CM | POA: Diagnosis present

## 2016-06-12 DIAGNOSIS — I8500 Esophageal varices without bleeding: Secondary | ICD-10-CM | POA: Diagnosis present

## 2016-06-12 DIAGNOSIS — J45909 Unspecified asthma, uncomplicated: Secondary | ICD-10-CM | POA: Diagnosis present

## 2016-06-12 DIAGNOSIS — K766 Portal hypertension: Secondary | ICD-10-CM | POA: Diagnosis present

## 2016-06-12 DIAGNOSIS — K219 Gastro-esophageal reflux disease without esophagitis: Secondary | ICD-10-CM | POA: Diagnosis present

## 2016-06-12 DIAGNOSIS — Z79899 Other long term (current) drug therapy: Secondary | ICD-10-CM | POA: Diagnosis not present

## 2016-06-12 DIAGNOSIS — D500 Iron deficiency anemia secondary to blood loss (chronic): Secondary | ICD-10-CM | POA: Diagnosis present

## 2016-06-12 DIAGNOSIS — Z9114 Patient's other noncompliance with medication regimen: Secondary | ICD-10-CM

## 2016-06-12 DIAGNOSIS — K7460 Unspecified cirrhosis of liver: Secondary | ICD-10-CM

## 2016-06-12 DIAGNOSIS — N183 Chronic kidney disease, stage 3 (moderate): Secondary | ICD-10-CM | POA: Diagnosis present

## 2016-06-12 DIAGNOSIS — D869 Sarcoidosis, unspecified: Secondary | ICD-10-CM | POA: Diagnosis present

## 2016-06-12 DIAGNOSIS — I130 Hypertensive heart and chronic kidney disease with heart failure and stage 1 through stage 4 chronic kidney disease, or unspecified chronic kidney disease: Secondary | ICD-10-CM | POA: Diagnosis present

## 2016-06-12 DIAGNOSIS — D638 Anemia in other chronic diseases classified elsewhere: Secondary | ICD-10-CM | POA: Diagnosis present

## 2016-06-12 DIAGNOSIS — E039 Hypothyroidism, unspecified: Secondary | ICD-10-CM | POA: Diagnosis present

## 2016-06-12 HISTORY — DX: Heart failure, unspecified: I50.9

## 2016-06-12 HISTORY — DX: Cardiac murmur, unspecified: R01.1

## 2016-06-12 LAB — IRON AND TIBC
IRON: 12 ug/dL — AB (ref 28–170)
SATURATION RATIOS: 3 % — AB (ref 10.4–31.8)
TIBC: 485 ug/dL — ABNORMAL HIGH (ref 250–450)
UIBC: 473 ug/dL

## 2016-06-12 LAB — COMPREHENSIVE METABOLIC PANEL
ALBUMIN: 3.7 g/dL (ref 3.5–5.0)
ALK PHOS: 89 U/L (ref 38–126)
ALT: 17 U/L (ref 14–54)
AST: 103 U/L — AB (ref 15–41)
Anion gap: 9 (ref 5–15)
BUN: 18 mg/dL (ref 6–20)
CALCIUM: 8.8 mg/dL — AB (ref 8.9–10.3)
CO2: 21 mmol/L — AB (ref 22–32)
CREATININE: 1.53 mg/dL — AB (ref 0.44–1.00)
Chloride: 107 mmol/L (ref 101–111)
GFR calc Af Amer: 42 mL/min — ABNORMAL LOW (ref >60)
GFR calc non Af Amer: 36 mL/min — ABNORMAL LOW (ref >60)
GLUCOSE: 80 mg/dL (ref 65–99)
Potassium: 3.8 mmol/L (ref 3.5–5.1)
SODIUM: 137 mmol/L (ref 135–145)
Total Bilirubin: 0.5 mg/dL (ref 0.3–1.2)
Total Protein: 8.6 g/dL — ABNORMAL HIGH (ref 6.5–8.1)

## 2016-06-12 LAB — TROPONIN I: Troponin I: <0.03 ng/mL (ref <0.03)

## 2016-06-12 LAB — CBC WITH DIFFERENTIAL/PLATELET
BASOS ABS: 0 10*3/uL (ref 0–0.1)
BASOS PCT: 0 %
BLASTS: 0 %
Band Neutrophils: 1 %
EOS PCT: 1 %
Eosinophils Absolute: 0 10*3/uL (ref 0–0.7)
HEMATOCRIT: 17.4 % — AB (ref 35.0–47.0)
HEMOGLOBIN: 5.4 g/dL — AB (ref 12.0–16.0)
LYMPHS PCT: 17 %
Lymphs Abs: 0.4 10*3/uL — ABNORMAL LOW (ref 1.0–3.6)
MCH: 20.3 pg — ABNORMAL LOW (ref 26.0–34.0)
MCHC: 30.8 g/dL — ABNORMAL LOW (ref 32.0–36.0)
MCV: 66 fL — ABNORMAL LOW (ref 80.0–100.0)
Metamyelocytes Relative: 1 %
Monocytes Absolute: 0.1 10*3/uL — ABNORMAL LOW (ref 0.2–0.9)
Monocytes Relative: 3 %
Myelocytes: 0 %
NEUTROS ABS: 2.1 10*3/uL (ref 1.4–6.5)
NEUTROS PCT: 77 %
Other: 0 %
Platelets: 163 10*3/uL (ref 150–440)
Promyelocytes Absolute: 0 %
RBC: 2.64 MIL/uL — AB (ref 3.80–5.20)
RDW: 23.6 % — ABNORMAL HIGH (ref 11.5–14.5)
WBC: 2.6 10*3/uL — ABNORMAL LOW (ref 3.6–11.0)
nRBC: 0 /100 WBC

## 2016-06-12 LAB — BRAIN NATRIURETIC PEPTIDE: B Natriuretic Peptide: 78 pg/mL (ref 0.0–100.0)

## 2016-06-12 LAB — FOLATE: Folate: 4.1 ng/mL — ABNORMAL LOW (ref >5.9)

## 2016-06-12 LAB — ETHANOL: Alcohol, Ethyl (B): <5 mg/dL (ref <5)

## 2016-06-12 LAB — FERRITIN: FERRITIN: 5 ng/mL — AB (ref 11–307)

## 2016-06-12 LAB — VITAMIN B12: Vitamin B-12: 415 pg/mL (ref 180–914)

## 2016-06-12 LAB — PREPARE RBC (CROSSMATCH)

## 2016-06-12 MED ORDER — NADOLOL 20 MG PO TABS
40.0000 mg | ORAL_TABLET | Freq: Every day | ORAL | Status: DC
  Administered 2016-06-12 – 2016-06-13 (×2): 40 mg via ORAL
  Filled 2016-06-12 (×2): qty 2

## 2016-06-12 MED ORDER — SODIUM CHLORIDE 0.9% FLUSH: 3.0000 mL | INTRAVENOUS | Status: DC | PRN

## 2016-06-12 MED ORDER — SODIUM CHLORIDE 0.9% FLUSH
3.0000 mL | Freq: Two times a day (BID) | INTRAVENOUS | Status: DC
  Administered 2016-06-12 – 2016-06-14 (×6): 3 mL via INTRAVENOUS

## 2016-06-12 MED ORDER — FUROSEMIDE 10 MG/ML IJ SOLN
20.0000 mg | Freq: Once | INTRAMUSCULAR | Status: AC
  Administered 2016-06-12: 20 mg via INTRAVENOUS
  Filled 2016-06-12: qty 4

## 2016-06-12 MED ORDER — SODIUM CHLORIDE 0.9 % IV SOLN
Freq: Once | INTRAVENOUS | Status: AC
  Administered 2016-06-12: 13:00:00 via INTRAVENOUS

## 2016-06-12 MED ORDER — SODIUM CHLORIDE 0.9 % IV SOLN
10.0000 mL/h | Freq: Once | INTRAVENOUS | Status: AC
  Administered 2016-06-12: 10 mL/h via INTRAVENOUS

## 2016-06-12 MED ORDER — HYDRALAZINE HCL 20 MG/ML IJ SOLN: 10.0000 mg | Freq: Four times a day (QID) | INTRAMUSCULAR | Status: DC | PRN

## 2016-06-12 MED ORDER — FAMOTIDINE IN NACL 20-0.9 MG/50ML-% IV SOLN
20.0000 mg | Freq: Two times a day (BID) | INTRAVENOUS | Status: DC
  Administered 2016-06-12 – 2016-06-15 (×7): 20 mg via INTRAVENOUS
  Filled 2016-06-12 (×8): qty 50

## 2016-06-12 MED ORDER — SPIRONOLACTONE 25 MG PO TABS
25.0000 mg | ORAL_TABLET | Freq: Every day | ORAL | Status: DC
  Administered 2016-06-12 – 2016-06-13 (×2): 25 mg via ORAL
  Filled 2016-06-12 (×2): qty 1

## 2016-06-12 MED ORDER — THIAMINE HCL 100 MG/ML IJ SOLN: 100.0000 mg | Freq: Every day | INTRAMUSCULAR | Status: DC

## 2016-06-12 MED ORDER — SODIUM CHLORIDE 0.9 % IV SOLN: 250.0000 mL | INTRAVENOUS | Status: DC | PRN

## 2016-06-12 MED ORDER — FAMOTIDINE IN NACL 20-0.9 MG/50ML-% IV SOLN: 20.0000 mg | Freq: Two times a day (BID) | INTRAVENOUS | Status: DC

## 2016-06-12 MED ORDER — CLONIDINE HCL 0.1 MG PO TABS
0.1000 mg | ORAL_TABLET | Freq: Every day | ORAL | Status: DC
  Administered 2016-06-12 – 2016-06-13 (×2): 0.1 mg via ORAL
  Filled 2016-06-12 (×2): qty 1

## 2016-06-12 MED ORDER — LEVOTHYROXINE SODIUM 50 MCG PO TABS
175.0000 ug | ORAL_TABLET | Freq: Every day | ORAL | Status: DC
  Administered 2016-06-12 – 2016-06-15 (×4): 175 ug via ORAL
  Filled 2016-06-12 (×2): qty 3
  Filled 2016-06-12 (×2): qty 1
  Filled 2016-06-12: qty 4
  Filled 2016-06-12: qty 3

## 2016-06-12 MED ORDER — SODIUM CHLORIDE 0.9% FLUSH
3.0000 mL | Freq: Two times a day (BID) | INTRAVENOUS | Status: DC
  Administered 2016-06-12 – 2016-06-15 (×7): 3 mL via INTRAVENOUS

## 2016-06-12 MED ORDER — ADULT MULTIVITAMIN W/MINERALS CH
1.0000 | ORAL_TABLET | Freq: Every day | ORAL | Status: DC
  Administered 2016-06-12 – 2016-06-15 (×3): 1 via ORAL
  Filled 2016-06-12 (×3): qty 1

## 2016-06-12 MED ORDER — FOLIC ACID 1 MG PO TABS
1.0000 mg | ORAL_TABLET | Freq: Every day | ORAL | Status: DC
  Administered 2016-06-12 – 2016-06-15 (×4): 1 mg via ORAL
  Filled 2016-06-12 (×4): qty 1

## 2016-06-12 MED ORDER — ONDANSETRON HCL 4 MG PO TABS
4.0000 mg | ORAL_TABLET | Freq: Four times a day (QID) | ORAL | Status: DC | PRN
Start: 2016-06-12 — End: 2016-06-15

## 2016-06-12 MED ORDER — FUROSEMIDE 10 MG/ML IJ SOLN
20.0000 mg | Freq: Two times a day (BID) | INTRAMUSCULAR | Status: DC
  Administered 2016-06-12 – 2016-06-13 (×3): 20 mg via INTRAVENOUS
  Filled 2016-06-12 (×3): qty 2

## 2016-06-12 MED ORDER — LORAZEPAM 1 MG PO TABS: 1.0000 mg | ORAL_TABLET | Freq: Four times a day (QID) | ORAL | Status: AC | PRN

## 2016-06-12 MED ORDER — VITAMIN B-1 100 MG PO TABS
100.0000 mg | ORAL_TABLET | Freq: Every day | ORAL | Status: DC
  Administered 2016-06-12 – 2016-06-15 (×4): 100 mg via ORAL
  Filled 2016-06-12 (×4): qty 1

## 2016-06-12 MED ORDER — LORAZEPAM 2 MG/ML IJ SOLN: 1.0000 mg | Freq: Four times a day (QID) | INTRAMUSCULAR | Status: AC | PRN

## 2016-06-12 MED ORDER — OXYCODONE HCL 5 MG PO TABS
5.0000 mg | ORAL_TABLET | ORAL | Status: DC | PRN
  Administered 2016-06-12: 5 mg via ORAL
  Filled 2016-06-12: qty 1

## 2016-06-12 MED ORDER — ONDANSETRON HCL 4 MG/2ML IJ SOLN: 4.0000 mg | Freq: Four times a day (QID) | INTRAMUSCULAR | Status: DC | PRN

## 2016-06-12 MED ORDER — ACETAMINOPHEN 650 MG RE SUPP: 650.0000 mg | Freq: Four times a day (QID) | RECTAL | Status: DC | PRN

## 2016-06-12 MED ORDER — ACETAMINOPHEN 325 MG PO TABS: 650.0000 mg | ORAL_TABLET | Freq: Four times a day (QID) | ORAL | Status: DC | PRN

## 2016-06-12 NOTE — ED Notes (Signed)
Pt. Has non-pitting swelling to bilateral lower extremities.  Pt. States she has not been taking fluid pill for the past couple days. Pt c/o chest pain that radiates down left arm, with accompanying symptoms of weakness. Pt c/o of increased fatigue.

## 2016-06-12 NOTE — Progress Notes (Signed)
[facility]  Patient Status: CLOSED SERVICES 04/10/2016     Dimple CaseyJason E [name] 06/12/2016, 2:17 PM

## 2016-06-12 NOTE — Consult Note (Signed)
Reason for Consult: GI Bleed Referring Physician: Dr. Lodema Hong Kathryn is an 60 y.o. female with a past medical history significant for alcoholic cirrhosis with esophageal varices s/p banding most recently in 2016. PMH significant for Asthma, Sarcoidosis, Hyperthyroidism and Hypertension.   HPI:   Patient gives a two day history of increasing fatigue, shortness of breath, chest pain, palpitations and bilateral lower extremity edema. She presented to the Emergency Department this morning because of worsening symptoms. She admits to not being compliant with medications. She has been off Aldactone and Corgard. Admits to taking every other day when she does take them. She denies any hematemesis, melena or bright red bleeding per rectum. No abdominal pain at present. May experience some sternal discomfort and reflux. She takes Omeprazole for reflux. Experiencing difficulty swallowing that started over the last couple of days. Says she is still drinking a ''shot of Vodka '' daily.  Last drink a couple of days ago. Denies tobacco use. No NSAIDS.   Found to have a hemoglobin of 5.4 gm/dl and a trace positive stool guaiac. Hct 17.4, MCV 66 and Platelets 163,000. Ferritin 5, Iron 12, Sat 3 and Folate 4.1. Troponin negative. BNP 78. Ethyl Alcohol <5. Chest x-ray normal. Bilateral venous doppler study negative for deep vein thrombosis. She is receiving the first of two units of pack red blood cells ordered.   She underwent an EGD on 04/04/15 per Dr. Allen Norris for evaluation of hematemesis in the inpatient setting. Grade II esophageal varices were noted. Completely eridciated ( Banded ). Portal hypertensive gastropathy and duodenitis noted.   Prior to that she had an EGD per Dr. Candace Cruise on 12/17/13 for evaluation of lower abdominal pain and hematemesis during hospitalization. Grade I esophageal varices and portal hypertensive gastropathy was noted. Otherwise exam was normal. Unfortunately she didn't follow up in the  outpatient setting as recommended.   Colonoscopy in 2011 was normal with exception of internal hemorrhoids. She also underwent an EGD in 2011 for follow up of esophageal varices. Grade III esophageal varices were noted. Banded ad incompletely eradicated. Portal hypertensive gastropathy noted. She was lost in follow up.   Past Medical History  Diagnosis Date  . Asthma   . Sarcoidosis ([place])   . Hypothyroidism   . Chronic disease anemia   . Hypertension   . Esophageal varices ([place])     GR I on EGD by Dr Rayann Heman 09/2014  . Portal hypertension ([place])   . Alcoholic cirrhosis of liver ([place])   . ETOH abuse     Past Surgical History  Procedure Laterality Date  . Tubal ligation    . Esophagogastroduodenoscopy w/ banding  09/2014    [place]  . Esophagogastroduodenoscopy (egd) with propofol N/A 04/04/2015    Procedure: ESOPHAGOGASTRODUODENOSCOPY (EGD) WITH PROPOFOL;  Surgeon: Lucilla Lame, MD;  Location: [facility] ENDOSCOPY;  Service: Endoscopy;  Laterality: N/A;  Dr Allen Norris prefers 1445    Family History  Problem Relation Age of Onset  . Aneurysm Father   . COPD Father   . Heart attack Mother     Social History:  reports that she has never smoked. She does not have any smokeless tobacco history on file. She reports that she drinks about 9.0 oz of alcohol per week. She reports that she does not use illicit drugs.  Allergies:  Allergies  Allergen Reactions  . Hydrocodone-Acetaminophen Other (See Comments)    Reaction: pt can't take anything with tylenol due to her liver disease.   . Ibuprofen Other (  See Comments)    Esophageal varices  . Naproxen Other (See Comments)    Esophageal varices  . Propoxyphene Other (See Comments)    Reaction: pt isn't sure, but knows she can't take it.   . Tamiflu  [Oseltamivir Phosphate] Other (See Comments)    Esophageal varices    Medications:  I have reviewed the patient's current medications. Prior to Admission:  Prescriptions prior to admission  Medication  Sig Dispense Refill Last Dose  . cloNIDine (CATAPRES) 0.1 MG tablet Take 1 tablet by mouth at bedtime.   unknown at unknown  . levothyroxine (SYNTHROID, LEVOTHROID) 175 MCG tablet Take 175 mcg by mouth daily before breakfast.    unknown at unknown  . nadolol (CORGARD) 80 MG tablet Take 0.5 tablets (40 mg total) by mouth daily. 30 tablet 1 unknown at unknown  . pantoprazole (PROTONIX) 40 MG tablet Take 1 tablet (40 mg total) by mouth 2 (two) times daily before a meal. (Patient taking differently: Take 40 mg by mouth daily. ) 60 tablet 1 unknown at unknown  . spironolactone (ALDACTONE) 25 MG tablet Take 1 tablet (25 mg total) by mouth daily. 30 tablet 0 unknown at unknown   Scheduled: . sodium chloride  10 mL/hr Intravenous Once  . cloNIDine  0.1 mg Oral QHS  . famotidine (PEPCID) IV  20 mg Intravenous Q12H  . folic acid  1 mg Oral Daily  . furosemide  20 mg Intravenous Q12H  . levothyroxine  175 mcg Oral QAC breakfast  . multivitamin with minerals  1 tablet Oral Daily  . nadolol  40 mg Oral Daily  . sodium chloride flush  3 mL Intravenous Q12H  . sodium chloride flush  3 mL Intravenous Q12H  . spironolactone  25 mg Oral Daily  . thiamine  100 mg Oral Daily   Or  . thiamine  100 mg Intravenous Daily   Continuous:  ZTI:[id number] chloride, acetaminophen **OR** acetaminophen, hydrALAZINE, LORazepam **OR** LORazepam, ondansetron **OR** ondansetron (ZOFRAN) IV, oxyCODONE, sodium chloride flush Anti-infectives    None      Results for orders placed or performed during the hospital encounter of 06/12/16 (from the past 48 hour(s))  Troponin I     Status: None   Collection Time: 06/12/16  8:20 AM  Result Value Ref Range   Troponin I <0.03 <0.03 ng/mL  CBC with Differential     Status: Abnormal   Collection Time: 06/12/16  8:20 AM  Result Value Ref Range   WBC 2.6 (L) 3.6 - 11.0 K/uL   RBC 2.64 (L) 3.80 - 5.20 MIL/uL   Hemoglobin 5.4 (L) 12.0 - 16.0 g/dL   HCT 17.4 (L) 35.0 - 47.0 %   MCV  66.0 (L) 80.0 - 100.0 fL   MCH 20.3 (L) 26.0 - 34.0 pg   MCHC 30.8 (L) 32.0 - 36.0 g/dL   RDW 23.6 (H) 11.5 - 14.5 %   Platelets 163 150 - 440 K/uL   Neutrophils Relative % 77 %   Lymphocytes Relative 17 %   Monocytes Relative 3 %   Eosinophils Relative 1 %   Basophils Relative 0 %   Band Neutrophils 1 %   Metamyelocytes Relative 1 %   Myelocytes 0 %   Promyelocytes Absolute 0 %   Blasts 0 %   nRBC 0 0 /100 WBC   Other 0 %   Neutro Abs 2.1 1.4 - 6.5 K/uL   Lymphs Abs 0.4 (L) 1.0 - 3.6 K/uL   Monocytes Absolute 0.1 (  L) 0.2 - 0.9 K/uL   Eosinophils Absolute 0.0 0 - 0.7 K/uL   Basophils Absolute 0.0 0 - 0.1 K/uL   RBC Morphology MIXED RBC POPULATION     Comment: HYPOCHROMIA   Smear Review LARGE PLATELETS PRESENT   Brain natriuretic peptide     Status: None   Collection Time: 06/12/16  8:20 AM  Result Value Ref Range   B Natriuretic Peptide 78.0 0.0 - 100.0 pg/mL  Comprehensive metabolic panel     Status: Abnormal   Collection Time: 06/12/16  8:20 AM  Result Value Ref Range   Sodium 137 135 - 145 mmol/L   Potassium 3.8 3.5 - 5.1 mmol/L   Chloride 107 101 - 111 mmol/L   CO2 21 (L) 22 - 32 mmol/L   Glucose, Bld 80 65 - 99 mg/dL   BUN 18 6 - 20 mg/dL   Creatinine, Ser 1.53 (H) 0.44 - 1.00 mg/dL   Calcium 8.8 (L) 8.9 - 10.3 mg/dL   Total Protein 8.6 (H) 6.5 - 8.1 g/dL   Albumin 3.7 3.5 - 5.0 g/dL   AST 103 (H) 15 - 41 U/L   ALT 17 14 - 54 U/L   Alkaline Phosphatase 89 38 - 126 U/L   Total Bilirubin 0.5 0.3 - 1.2 mg/dL   GFR calc non Af Amer 36 (L) >60 mL/min   GFR calc Af Amer 42 (L) >60 mL/min    Comment: (NOTE) The eGFR has been calculated using the CKD EPI equation. This calculation has not been validated in all clinical situations. eGFR's persistently <60 mL/min signify possible Chronic Kidney Disease.    Anion gap 9 5 - 15  Ethanol     Status: None   Collection Time: 06/12/16  8:20 AM  Result Value Ref Range   Alcohol, Ethyl (B) <5 <5 mg/dL    Comment:         LOWEST DETECTABLE LIMIT FOR SERUM ALCOHOL IS 5 mg/dL FOR MEDICAL PURPOSES ONLY   Ferritin     Status: Abnormal   Collection Time: 06/12/16  8:20 AM  Result Value Ref Range   Ferritin 5 (L) 11 - 307 ng/mL  Iron and TIBC     Status: Abnormal   Collection Time: 06/12/16  8:20 AM  Result Value Ref Range   Iron 12 (L) 28 - 170 ug/dL   TIBC 485 (H) 250 - 450 ug/dL   Saturation Ratios 3 (L) 10.4 - 31.8 %   UIBC 473 ug/dL  Folate     Status: Abnormal   Collection Time: 06/12/16  8:20 AM  Result Value Ref Range   Folate 4.1 (L) >5.9 ng/mL  Type and screen     Status: None (Preliminary result)   Collection Time: 06/12/16 10:47 AM  Result Value Ref Range   ABO/RH(D) A POS    Antibody Screen NEG    Sample Expiration 06/15/2016    Unit Number [id number]    Blood Component Type RBC, LR IRR    Unit division 00    Status of Unit ISSUED    Transfusion Status OK TO TRANSFUSE    Crossmatch Result Compatible    Unit Number [id number]    Blood Component Type RBC, LR IRR    Unit division 00    Status of Unit ALLOCATED    Transfusion Status OK TO TRANSFUSE    Crossmatch Result Compatible   Prepare RBC     Status: None   Collection Time: 06/12/16 10:47 AM  Result Value  Ref Range   Order Confirmation ORDER PROCESSED BY BLOOD BANK     Dg Chest 2 View  06/12/2016  CLINICAL DATA:  Lower extremity edema for 2 days. Chest tightness and shortness of breath for 2 days EXAM: CHEST  2 VIEW COMPARISON:  February 11, 2016 FINDINGS: There is no edema or consolidation. Heart is upper normal in size with pulmonary vascularity within normal limits. No adenopathy. No pneumothorax no bone lesions. IMPRESSION: No edema or consolidation. Electronically Signed   By: Lowella Grip III M.D.   On: 06/12/2016 07:56   US Venous Img Lower Bilateral  06/12/2016  CLINICAL DATA:  Lower extremity pain and edema EXAM: BILATERAL LOWER EXTREMITY VENOUS DUPLEX ULTRASOUND TECHNIQUE: Gray-scale sonography with graded  compression, as well as color Doppler and duplex ultrasound were performed to evaluate the lower extremity deep venous systems from the level of the common femoral vein and including the common femoral, femoral, profunda femoral, popliteal and calf veins including the posterior tibial, peroneal and gastrocnemius veins when visible. The superficial great saphenous vein was also interrogated. Spectral Doppler was utilized to evaluate flow at rest and with distal augmentation maneuvers in the common femoral, femoral and popliteal veins. COMPARISON:  None. FINDINGS: RIGHT LOWER EXTREMITY Common Femoral Vein: No evidence of thrombus. Normal compressibility, respiratory phasicity and response to augmentation. Saphenofemoral Junction: No evidence of thrombus. Normal compressibility and flow on color Doppler imaging. Profunda Femoral Vein: No evidence of thrombus. Normal compressibility and flow on color Doppler imaging. Femoral Vein: No evidence of thrombus. Normal compressibility, respiratory phasicity and response to augmentation. Popliteal Vein: No evidence of thrombus. Normal compressibility, respiratory phasicity and response to augmentation. Calf Veins: No evidence of thrombus. Normal compressibility and flow on color Doppler imaging. Superficial Great Saphenous Vein: No evidence of thrombus. Normal compressibility and flow on color Doppler imaging. Venous Reflux:  None. Other Findings:  None. LEFT LOWER EXTREMITY Common Femoral Vein: No evidence of thrombus. Normal compressibility, respiratory phasicity and response to augmentation. Saphenofemoral Junction: No evidence of thrombus. Normal compressibility and flow on color Doppler imaging. Profunda Femoral Vein: No evidence of thrombus. Normal compressibility and flow on color Doppler imaging. Femoral Vein: No evidence of thrombus. Normal compressibility, respiratory phasicity and response to augmentation. Popliteal Vein: No evidence of thrombus. Normal  compressibility, respiratory phasicity and response to augmentation. Calf Veins: No evidence of thrombus. Normal compressibility and flow on color Doppler imaging. Superficial Great Saphenous Vein: No evidence of thrombus. Normal compressibility and flow on color Doppler imaging. Venous Reflux:  None. Other Findings:  None. IMPRESSION: No evidence of deep venous thrombosis in either lower extremity. Electronically Signed   By: Lowella Grip III M.D.   On: 06/12/2016 08:20    Review of Systems  Constitutional: Positive for malaise/fatigue. Negative for fever, chills and diaphoresis.       Reports chronic anemia and fatigue but it worsened over the last couple of days.   HENT: Negative.   Eyes: Negative.   Respiratory: Positive for shortness of breath. Negative for cough, hemoptysis, sputum production and wheezing.   Cardiovascular: Positive for chest pain, palpitations and leg swelling. Negative for orthopnea, claudication and PND.       Onset of chest pain a couple of days ago. Worsened causing her to come to the emergency room this morning. None at present.   Felt that her heart was racing over the last couple of days but none at this time.   Bilateral lower extremity edema. Out of Spironolactone for the  last couple of days.    Gastrointestinal: Positive for heartburn. Negative for nausea, vomiting, abdominal pain, diarrhea, constipation, blood in stool and melena.       Omeprazole at home.   Musculoskeletal: Positive for myalgias. Negative for back pain, joint pain, falls and neck pain.       Bilateral leg pain with swelling prior to admission. Decreased now with pulsatile hose.   Skin: Negative.   Neurological: Positive for dizziness and weakness.  Endo/Heme/Allergies: Negative.   Psychiatric/Behavioral: Positive for substance abuse. The patient is nervous/anxious.        She admits to drinking a '' shot of Vodka '' daily. Last drink 2 days ago. Drinking helps her nerves.    Blood  pressure 149/76, pulse 78, temperature 98.3 F (36.8 C), temperature source Oral, resp. rate 18, height 5' 4"  (1.626 m), weight 86.229 kg (190 lb 1.6 oz), SpO2 100 %. Physical Exam  Nursing note and vitals reviewed. Constitutional: She is oriented to person, place, and time. She appears well-developed and well-nourished. No distress.  HENT:  Head: Normocephalic and atraumatic.  Eyes: No scleral icterus.  Neck: Normal range of motion. Neck supple. No JVD present. No tracheal deviation present. No thyromegaly present.  Cardiovascular: Normal rate and regular rhythm.  Exam reveals no gallop and no friction rub.   Murmur heard. Respiratory: Effort normal and breath sounds normal. No stridor. No respiratory distress. She has no wheezes. She has no rales. She exhibits no tenderness.  GI: Soft. Bowel sounds are normal. She exhibits no distension and no mass. There is no tenderness. There is no rebound and no guarding.  Musculoskeletal: She exhibits edema. She exhibits no tenderness.  Lymphadenopathy:    She has no cervical adenopathy.  Neurological: She is alert and oriented to person, place, and time.  Skin: Skin is warm and dry. No rash noted. She is not diaphoretic. No erythema. No pallor.  Psychiatric: She has a normal mood and affect.    Assessment/Plan: Severe Anemia: Maybe related to esophageal variceal bleeding, PUD or colon source. Transfusions in progress. Per discussion with Dr. Vira Agar, we will obtain an abdominal ultrasound, AFP, Ammonia level, PT/PTT and change diet to mech soft. Dr. Vira Agar will make a decision on an Upper Endoscopy later today.   Patient was seen in collaboration with Dr. Gaylyn Cheers.    Thank you for allowing [place] to participate in patient's care. Please call with any questions or concerns.   Faye Ramsay, MSN, FNP-BC 06/12/2016, 1:37 PM

## 2016-06-12 NOTE — ED Provider Notes (Signed)
Time Seen: Approximately *0715 I have reviewed the triage notes  Chief Complaint: Leg Pain   History of Present Illness: Kathryn [name] is a 60 y.o. female who has history of multiple medical problems including alcohol related cirrhosis, chronic anemia, intermittent chest pain. She has history of esophageal varices. She states that she's had some mild chest discomfort which started this morning and describes similar episodes of chest discomfort in the past and describes it as a pressure discomfort without radiation to the arm, jaw, neck area. Patient admits that she's been noncompliant with her medications which include spironolactone and her primary complaint was actually a 3 day history of swelling in both of her lower extremities with discomfort. States the left leg is worse than the right leg. She describes some generalized weakness and some mild shortness of breath with increased fatigue. She denies any melena or hematochezia. Patient admits to still drinking and the last drink was last night. She states she hasn't taken the spironolactone and over a month. The other medications she hasn't had for the last couple of days.   Past Medical History  Diagnosis Date  . Asthma   . Sarcoidosis (HCC)   . Hypothyroidism   . Chronic disease anemia   . Hypertension   . Esophageal varices (HCC)     GR I on EGD by Dr Shelle Iron 09/2014  . Portal hypertension (HCC)   . Alcoholic cirrhosis of liver (HCC)   . ETOH abuse     Patient Active Problem List   Diagnosis Date Noted  . Hypoglycemia 02/11/2016  . Hematemesis 04/04/2015  . Cirrhosis (HCC) 04/04/2015  . Esophageal varices (HCC) 04/04/2015  . Hypothyroid 04/04/2015  . Hypertension 04/04/2015  . Chest pain 04/04/2015    Past Surgical History  Procedure Laterality Date  . Tubal ligation    . Esophagogastroduodenoscopy w/ banding  09/2014    Rein  . Esophagogastroduodenoscopy (egd) with propofol N/A 04/04/2015    Procedure:  ESOPHAGOGASTRODUODENOSCOPY (EGD) WITH PROPOFOL;  Surgeon: Midge Minium, MD;  Location: [facility] ENDOSCOPY;  Service: Endoscopy;  Laterality: N/A;  Dr Servando Snare prefers [phone]    Past Surgical History  Procedure Laterality Date  . Tubal ligation    . Esophagogastroduodenoscopy w/ banding  09/2014    Rein  . Esophagogastroduodenoscopy (egd) with propofol N/A 04/04/2015    Procedure: ESOPHAGOGASTRODUODENOSCOPY (EGD) WITH PROPOFOL;  Surgeon: Midge Minium, MD;  Location: [facility] ENDOSCOPY;  Service: Endoscopy;  Laterality: N/A;  Dr Servando Snare prefers [phone]    Current Outpatient Rx  Name  Route  Sig  Dispense  Refill  . cloNIDine (CATAPRES) 0.1 MG tablet   Oral   Take 1 tablet by mouth at bedtime.         Marland Kitchen levothyroxine (SYNTHROID, LEVOTHROID) 175 MCG tablet   Oral   Take 175 mcg by mouth daily before breakfast.          . nadolol (CORGARD) 80 MG tablet   Oral   Take 0.5 tablets (40 mg total) by mouth daily.   30 tablet   1   . pantoprazole (PROTONIX) 40 MG tablet   Oral   Take 1 tablet (40 mg total) by mouth 2 (two) times daily before a meal. Patient taking differently: Take 40 mg by mouth daily.    60 tablet   1   . spironolactone (ALDACTONE) 25 MG tablet   Oral   Take 1 tablet (25 mg total) by mouth daily.   30 tablet   0  Allergies:  Hydrocodone-acetaminophen; Ibuprofen; Naproxen; Propoxyphene; and Tamiflu   Family History: Family History  Problem Relation Age of Onset  . Aneurysm Father   . COPD Father   . Heart attack Mother     Social History: Social History  Substance Use Topics  . Smoking status: Never Smoker   . Smokeless tobacco: None  . Alcohol Use: 9.0 oz/week    15 Shots of liquor per week     Comment: Quit ETOH- 04/04/15, Heavy Use prior to this     Review of Systems:   10 point review of systems was performed and was otherwise negative:  Constitutional: No fever Eyes: No visual disturbances ENT: No sore throat, ear pain Cardiac: Mild current chest  pain Respiratory: Mild shortness of breath, no wheezing, or stridor Abdomen: No abdominal pain, no vomiting, No diarrhea Endocrine: No weight loss, No night sweats Extremities: Increased peripheral edema in both legs without cyanosis Skin: No rashes, easy bruising Neurologic: No focal weakness, trouble with speech or swollowing Urologic: No dysuria, Hematuria, or urinary frequency   Physical Exam:  ED Triage Vitals  Enc Vitals Group     BP 06/12/16 0649 160/102 mmHg     Pulse Rate 06/12/16 0649 77     Resp --      Temp 06/12/16 0649 98.6 F (37 C)     Temp Source 06/12/16 0649 Oral     SpO2 06/12/16 0649 100 %     Weight 06/12/16 0649 196 lb 9.6 oz (89.177 kg)     Height 06/12/16 0649 5\' 4"  (1.626 m)     Head Cir --      Peak Flow --      Pain Score 06/12/16 0650 5     Pain Loc --      Pain Edu? --      Excl. in GC? --     General: Awake , Alert , and Oriented times 3; GCS 15 Head: Normal cephalic , atraumatic Eyes: Pupils equal , round, reactive to light Nose/Throat: No nasal drainage, patent upper airway without erythema or exudate.  Neck: Supple, Full range of motion, No anterior adenopathy or palpable thyroid masses Lungs: Clear to ascultation without wheezes , rhonchi, or rales Heart: Regular rate, regular rhythm without murmurs , gallops , or rubs Abdomen: Soft, non tender without rebound, guarding , or rigidity; bowel sounds positive and symmetric in all 4 quadrants. No organomegaly .        Extremities: Circumferential mildly pitting edema in both lower extremities up to the level of the knee. No focal calf tenderness and negative Homans sign Neurologic: normal ambulation, Motor symmetric without deficits, sensory intact Skin: warm, dry, no rashes  Rectal exam with chaperone present shows numerous external hemorrhoids, normal sphincter tone, trace guaiac positive though not a lot of stool in the rectal vault. No palpable masses Labs:   All laboratory work was  reviewed including any pertinent negatives or positives listed below:  Labs Reviewed  TROPONIN I  CBC WITH DIFFERENTIAL/PLATELET  BRAIN NATRIURETIC PEPTIDE  COMPREHENSIVE METABOLIC PANEL  ETHANOL    EKG:  ED ECG REPORT I, Jennye Moccasin, the attending physician, personally viewed and interpreted this ECG.  Date: 06/12/2016 EKG Time: 719 Rate: 76 Rhythm: normal sinus rhythm QRS Axis: normal Intervals: normal ST/T Wave abnormalities: Nonspecific T wave abnormality. Slightly prolonged QT interval Conduction Disturbances: none Narrative Interpretation: unremarkable No acute ischemic changes No significant change from 02-11-2016  Radiology:     US Venous  Img Lower Bilateral (Final result) Result time: 06/12/16 08:20:38   Final result by Rad Results In Interface (06/12/16 08:20:38)   Narrative:   CLINICAL DATA: Lower extremity pain and edema  EXAM: BILATERAL LOWER EXTREMITY VENOUS DUPLEX ULTRASOUND  TECHNIQUE: Gray-scale sonography with graded compression, as well as color Doppler and duplex ultrasound were performed to evaluate the lower extremity deep venous systems from the level of the common femoral vein and including the common femoral, femoral, profunda femoral, popliteal and calf veins including the posterior tibial, peroneal and gastrocnemius veins when visible. The superficial great saphenous vein was also interrogated. Spectral Doppler was utilized to evaluate flow at rest and with distal augmentation maneuvers in the common femoral, femoral and popliteal veins.  COMPARISON: None.  FINDINGS: RIGHT LOWER EXTREMITY  Common Femoral Vein: No evidence of thrombus. Normal compressibility, respiratory phasicity and response to augmentation.  Saphenofemoral Junction: No evidence of thrombus. Normal compressibility and flow on color Doppler imaging.  Profunda Femoral Vein: No evidence of thrombus. Normal compressibility and flow on color Doppler  imaging.  Femoral Vein: No evidence of thrombus. Normal compressibility, respiratory phasicity and response to augmentation.  Popliteal Vein: No evidence of thrombus. Normal compressibility, respiratory phasicity and response to augmentation.  Calf Veins: No evidence of thrombus. Normal compressibility and flow on color Doppler imaging.  Superficial Great Saphenous Vein: No evidence of thrombus. Normal compressibility and flow on color Doppler imaging.  Venous Reflux: None.  Other Findings: None.  LEFT LOWER EXTREMITY  Common Femoral Vein: No evidence of thrombus. Normal compressibility, respiratory phasicity and response to augmentation.  Saphenofemoral Junction: No evidence of thrombus. Normal compressibility and flow on color Doppler imaging.  Profunda Femoral Vein: No evidence of thrombus. Normal compressibility and flow on color Doppler imaging.  Femoral Vein: No evidence of thrombus. Normal compressibility, respiratory phasicity and response to augmentation.  Popliteal Vein: No evidence of thrombus. Normal compressibility, respiratory phasicity and response to augmentation.  Calf Veins: No evidence of thrombus. Normal compressibility and flow on color Doppler imaging.  Superficial Great Saphenous Vein: No evidence of thrombus. Normal compressibility and flow on color Doppler imaging.  Venous Reflux: None.  Other Findings: None.  IMPRESSION: No evidence of deep venous thrombosis in either lower extremity.   Electronically Signed By: Bretta BangWilliam Woodruff III M.D. On: 06/12/2016 08:20          DG Chest 2 View (Final result) Result time: 06/12/16 07:56:13   Final result by Rad Results In Interface (06/12/16 07:56:13)   Narrative:   CLINICAL DATA: Lower extremity edema for 2 days. Chest tightness and shortness of breath for 2 days  EXAM: CHEST 2 VIEW  COMPARISON: February 11, 2016  FINDINGS: There is no edema or consolidation. Heart is  upper normal in size with pulmonary vascularity within normal limits. No adenopathy. No pneumothorax no bone lesions.  IMPRESSION: No edema or consolidation.   Electronically Signed By: Bretta BangWilliam Woodruff III M.D. On: 06/12/2016 07:56        ECG Results    None         I personally reviewed the radiologic studies  CRITICAL CARE Performed by: Jennye MoccasinBrian S Daizy Outen   Total critical care time: 33 minutes  Critical care time was exclusive of separately billable procedures and treating other patients.  Critical care was necessary to treat or prevent imminent or life-threatening deterioration.  Critical care was time spent personally by me on the following activities: development of treatment plan with patient and/or surrogate as well as nursing, discussions  with consultants, evaluation of patient's response to treatment, examination of patient, obtaining history from patient or surrogate, ordering and performing treatments and interventions, ordering and review of laboratory studies, ordering and review of radiographic studies, pulse oximetry and re-evaluation of patient's condition. Initiation of blood transfusion for her symptomatic anemia and possible upper gastrointestinal bleeding  ED Course:  Patient appears to be anemic with no evidence of congestive heart failure at this time. Patient's been noncompliant with her medication and does have a history of esophageal varices with her chronic cirrhosis etc. Patient's Doppler study showed no evidence of deep venous thrombosis and I felt given her current clinical presentation most of her symptoms could be explained due to the anemia and noncompliance with medication etc. Case will require further inpatient treatment and the patient's case will be reviewed with the hospitalist team   Assessment:  Symptomatic anemia Possible upper gastrointestinal bleed      Plan:  Inpatient            Jennye Moccasin,  MD 06/12/16 [phone]

## 2016-06-12 NOTE — ED Notes (Signed)
New blood work sent with correct labels. Informed MD and lab.

## 2016-06-12 NOTE — Clinical Social Work Note (Signed)
Clinical Social Work Assessment  Patient Details  Name: Kathryn [name] MRN: [id number] Date of Birth: 08/10/1956  Date of referral:  06/12/16               Reason for consult:  Substance Use/ETOH Abuse, Facility Placement                Permission sought to share information with:  Facility Contact Representative Permission granted to share information::  No  Name::        Agency::     Relationship::     Contact Information:     Housing/Transportation Living arrangements for the past 2 months:  Single Family Home Source of Information:  Patient Patient Interpreter Needed:  None Criminal Activity/Legal Involvement Pertinent to Current Situation/Hospitalization:  No - Comment as needed Significant Relationships:  Adult Children Lives with:  Adult Children Do you feel safe going back to the place where you live?  Yes Need for family participation in patient care:  Yes (Comment)  Care giving concerns:  Patient lives in [place] with her daughter Tiffany and 2 grandchildren.    Social Worker assessment / plan:  Clinical Social Worker (CSW) received consult from RN Case Manager for ETOH abuse and possible SNF placement. CSW met with patient alone at bedside. Patient was alert and oriented and was laying in the bed. CSW introduced self and explained role of CSW department. Patient reported that she lives with her daughter and usually 2 grandchildren. Per patient she had 4 grandchildren in her house this past week visiting. Patient reported that she drinks 1 shot of liquor per day and has been trying to stop. CSW discussed possible D/C plans such as SNF and home health. Patient adamantly refused SNF and reported that she does well at home. CSW provided patient with outpatient substance abuse resources. RN case manager is aware of above. CSW will continue to follow and assist as needed.   Employment status:  Retired Insurance information:  Managed Medicare PT Recommendations:  Not assessed at this  time Information / Referral to community resources:  Outpatient Substance Abuse Treatment Options  Patient/Family's Response to care:  Patient prefers to return home.   Patient/Family's Understanding of and Emotional Response to Diagnosis, Current Treatment, and Prognosis:  Patient was pleasant and thanked CSW for visit.   Emotional Assessment Appearance:  Appears older than stated age Attitude/Demeanor/Rapport:    Affect (typically observed):  Accepting, Adaptable, Pleasant Orientation:  Oriented to Self, Oriented to Place, Oriented to  Time, Oriented to Situation Alcohol / Substance use:  Alcohol Use Psych involvement (Current and /or in the community):  No (Comment)  Discharge Needs  Concerns to be addressed:  Discharge Planning Concerns Readmission within the last 30 days:  No Current discharge risk:  Substance Abuse, Chronically ill Barriers to Discharge:  Continued Medical Work up   Sample, Bailey M, LCSW 06/12/2016, 2:52 PM  

## 2016-06-12 NOTE — H&P (Signed)
[facility] - [place] at [facility]   PATIENT NAME: Kathryn [name]    MR#:  [id number]  DATE OF BIRTH:  [birth year]  DATE OF ADMISSION:  06/12/2016  PRIMARY CARE PHYSICIAN: Presley Raddle, MD   REQUESTING/REFERRING PHYSICIAN: Dr. Margaree Mackintosh MD  CHIEF COMPLAINT:   Chief Complaint  Patient presents with  . Leg Pain    Pt. states bilateral leg pain for the past 2 nights.    HISTORY OF PRESENT ILLNESS: Kathryn [name]  is a 60 y.o. 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 Was also been very short of breath and weak. Reports that her shortness of breath is worse with activity. She has also been feeling dizzy since yesterday. Also has had intermittent chest pains. Has not noticed any blood in her stool or has thrown up any blood.  PAST MEDICAL HISTORY:   Past Medical History  Diagnosis Date  . Asthma   . Sarcoidosis (HCC)   . Hypothyroidism   . Chronic disease anemia   . Hypertension   . Esophageal varices (HCC)     GR I on EGD by Dr Shelle Iron 09/2014  . Portal hypertension (HCC)   . Alcoholic cirrhosis of liver (HCC)   . ETOH abuse     PAST SURGICAL HISTORY: Past Surgical History  Procedure Laterality Date  . Tubal ligation    . Esophagogastroduodenoscopy w/ banding  09/2014    [place]  . Esophagogastroduodenoscopy (egd) with propofol N/A 04/04/2015    Procedure: ESOPHAGOGASTRODUODENOSCOPY (EGD) WITH PROPOFOL;  Surgeon: Midge Minium, MD;  Location: [facility] ENDOSCOPY;  Service: Endoscopy;  Laterality: N/A;  Dr Servando Snare prefers 1445    SOCIAL HISTORY:  Social History  Substance Use Topics  . Smoking status: Never Smoker   . Smokeless tobacco: Not on file  . Alcohol Use: 9.0 oz/week    15 Shots of liquor per week     Comment: Quit ETOH- 04/04/15,  Heavy Use prior to this    FAMILY HISTORY:  Family History  Problem Relation Age of Onset  . Aneurysm Father   . COPD Father   . Heart attack Mother     DRUG ALLERGIES:  Allergies  Allergen Reactions  . Hydrocodone-Acetaminophen Other (See Comments)    Reaction: pt can't take anything with tylenol due to her liver disease.   . Ibuprofen Other (See Comments)    Esophageal varices  . Naproxen Other (See Comments)    Esophageal varices  . Propoxyphene Other (See Comments)    Reaction: pt isn't sure, but knows she can't take it.   . Tamiflu  [Oseltamivir Phosphate] Other (See Comments)    Esophageal varices    REVIEW OF SYSTEMS:   CONSTITUTIONAL: No fever, Positive fatigue orPositive weakness.  EYES: No blurred or double vision.  EARS, NOSE, AND THROAT: No tinnitus or ear pain.  RESPIRATORY: No cough, positive shortness of breath, no wheezing or hemoptysis.  CARDIOVASCULAR: No chest pain, orthopnea, edema.  GASTROINTESTINAL: No nausea, vomiting, diarrhea or abdominal pain.  GENITOURINARY: No dysuria, hematuria.  ENDOCRINE: No polyuria, nocturia,  HEMATOLOGY: No anemia, easy bruising or bleeding SKIN: No rash or lesion. MUSCULOSKELETAL: No joint pain or arthritis.   NEUROLOGIC: No tingling, numbness, weakness.  PSYCHIATRY: No anxiety or depression.   MEDICATIONS AT HOME:  Prior to Admission medications  Medication Sig Start Date End Date Taking? Authorizing Provider  cloNIDine (CATAPRES) 0.1 MG tablet Take 1 tablet by mouth at bedtime.   Yes Historical Provider, MD  levothyroxine (SYNTHROID, LEVOTHROID) 175 MCG tablet Take 175 mcg by mouth daily before breakfast.    Yes Historical Provider, MD  nadolol (CORGARD) 80 MG tablet Take 0.5 tablets (40 mg total) by mouth daily. 04/06/15  Yes Altamese Dilling, MD  pantoprazole (PROTONIX) 40 MG tablet Take 1 tablet (40 mg total) by mouth 2 (two) times daily before a meal. Patient taking differently: Take 40 mg by mouth daily.   04/06/15  Yes Altamese Dilling, MD  spironolactone (ALDACTONE) 25 MG tablet Take 1 tablet (25 mg total) by mouth daily. 04/06/15  Yes Altamese Dilling, MD      PHYSICAL EXAMINATION:   VITAL SIGNS: Blood pressure 155/94, pulse 79, temperature 98.6 F (37 C), temperature source Oral, resp. rate 16, height  (1.626 m), weight 89.177 kg (196 lb 9.6 oz), SpO2 100 %.  GENERAL:  60 y.o.-year-old patient lying in the bed with no acute distress.  EYES: Pupils equal, round, reactive to light and accommodation. No scleral icterus. Extraocular muscles intact. Conjunctival pallor HEENT: Head atraumatic, normocephalic. Oropharynx and nasopharynx clear.  NECK:  Supple, no jugular venous distention. No thyroid enlargement, no tenderness.  LUNGS: Normal breath sounds bilaterally, no wheezing, rales,rhonchi or crepitation. No use of accessory muscles of respiration.  CARDIOVASCULAR: S1, S2 normal. No murmurs, rubs, or gallops.  ABDOMEN: Soft, nontender, nondistended. Bowel sounds present. No organomegaly or mass.  EXTREMITIES: No pedal edema, cyanosis, or clubbing.  NEUROLOGIC: Cranial nerves II through XII are intact. Muscle strength 5/5 in all extremities. Sensation intact. Gait not checked.  PSYCHIATRIC: The patient is alert and oriented x 3.  SKIN: No obvious rash, lesion, or ulcer.   LABORATORY PANEL:   CBC  Recent Labs Lab 06/12/16 0820  WBC 2.6*  HGB 5.4*  HCT 17.4*  PLT 163  MCV 66.0*  MCH 20.3*  MCHC 30.8*  RDW 23.6*  LYMPHSABS 0.4*  MONOABS 0.1*  EOSABS 0.0  BASOSABS 0.0   ------------------------------------------------------------------------------------------------------------------  Chemistries   Recent Labs Lab 06/12/16 0820  NA 137  K 3.8  CL 107  CO2 21*  GLUCOSE 80  BUN 18  CREATININE 1.53*  CALCIUM 8.8*  AST 103*  ALT 17  ALKPHOS 89  BILITOT 0.5    ------------------------------------------------------------------------------------------------------------------ estimated creatinine clearance is 42.8 mL/min (by C-G formula based on Cr of 1.53). ------------------------------------------------------------------------------------------------------------------ No results for input(s): TSH, T4TOTAL, T3FREE, THYROIDAB in the last 72 hours.  Invalid input(s): FREET3   Coagulation profile No results for input(s): INR, PROTIME in the last 168 hours. ------------------------------------------------------------------------------------------------------------------- No results for input(s): DDIMER in the last 72 hours. -------------------------------------------------------------------------------------------------------------------  Cardiac Enzymes  Recent Labs Lab 06/12/16 0820  TROPONINI <0.03   ------------------------------------------------------------------------------------------------------------------ Invalid input(s): POCBNP  ---------------------------------------------------------------------------------------------------------------  Urinalysis    Component Value Date/Time   COLORURINE YELLOW* 02/12/2016 2246   COLORURINE Yellow 09/25/2014 1610   APPEARANCEUR HAZY* 02/12/2016 2246   APPEARANCEUR Clear 09/25/2014 1610   LABSPEC 1.009 02/12/2016 2246   LABSPEC 1.023 09/25/2014 1610   PHURINE 6.0 02/12/2016 2246   PHURINE 5.0 09/25/2014 1610   GLUCOSEU NEGATIVE 02/12/2016 2246   GLUCOSEU Negative 09/25/2014 1610   HGBUR NEGATIVE 02/12/2016 2246   HGBUR 1+ 09/25/2014 1610   BILIRUBINUR NEGATIVE 02/12/2016 2246   BILIRUBINUR Negative 09/25/2014 1610   KETONESUR NEGATIVE 02/12/2016 2246   KETONESUR 2+ 09/25/2014 1610   PROTEINUR NEGATIVE 02/12/2016  2246   PROTEINUR Negative 09/25/2014 1610   NITRITE NEGATIVE 02/12/2016 2246   NITRITE Negative 09/25/2014 1610   LEUKOCYTESUR NEGATIVE 02/12/2016 2246    LEUKOCYTESUR Negative 09/25/2014 1610     RADIOLOGY: Dg Chest 2 View  06/12/2016  CLINICAL DATA:  Lower extremity edema for 2 days. Chest tightness and shortness of breath for 2 days EXAM: CHEST  2 VIEW COMPARISON:  February 11, 2016 FINDINGS: There is no edema or consolidation. Heart is upper normal in size with pulmonary vascularity within normal limits. No adenopathy. No pneumothorax no bone lesions. IMPRESSION: No edema or consolidation. Electronically Signed   By: Bretta Bang III M.D.   On: 06/12/2016 07:56   US Venous Img Lower Bilateral  06/12/2016  CLINICAL DATA:  Lower extremity pain and edema EXAM: BILATERAL LOWER EXTREMITY VENOUS DUPLEX ULTRASOUND TECHNIQUE: Gray-scale sonography with graded compression, as well as color Doppler and duplex ultrasound were performed to evaluate the lower extremity deep venous systems from the level of the common femoral vein and including the common femoral, femoral, profunda femoral, popliteal and calf veins including the posterior tibial, peroneal and gastrocnemius veins when visible. The superficial great saphenous vein was also interrogated. Spectral Doppler was utilized to evaluate flow at rest and with distal augmentation maneuvers in the common femoral, femoral and popliteal veins. COMPARISON:  None. FINDINGS: RIGHT LOWER EXTREMITY Common Femoral Vein: No evidence of thrombus. Normal compressibility, respiratory phasicity and response to augmentation. Saphenofemoral Junction: No evidence of thrombus. Normal compressibility and flow on color Doppler imaging. Profunda Femoral Vein: No evidence of thrombus. Normal compressibility and flow on color Doppler imaging. Femoral Vein: No evidence of thrombus. Normal compressibility, respiratory phasicity and response to augmentation. Popliteal Vein: No evidence of thrombus. Normal compressibility, respiratory phasicity and response to augmentation. Calf Veins: No evidence of thrombus. Normal compressibility and  flow on color Doppler imaging. Superficial Great Saphenous Vein: No evidence of thrombus. Normal compressibility and flow on color Doppler imaging. Venous Reflux:  None. Other Findings:  None. LEFT LOWER EXTREMITY Common Femoral Vein: No evidence of thrombus. Normal compressibility, respiratory phasicity and response to augmentation. Saphenofemoral Junction: No evidence of thrombus. Normal compressibility and flow on color Doppler imaging. Profunda Femoral Vein: No evidence of thrombus. Normal compressibility and flow on color Doppler imaging. Femoral Vein: No evidence of thrombus. Normal compressibility, respiratory phasicity and response to augmentation. Popliteal Vein: No evidence of thrombus. Normal compressibility, respiratory phasicity and response to augmentation. Calf Veins: No evidence of thrombus. Normal compressibility and flow on color Doppler imaging. Superficial Great Saphenous Vein: No evidence of thrombus. Normal compressibility and flow on color Doppler imaging. Venous Reflux:  None. Other Findings:  None. IMPRESSION: No evidence of deep venous thrombosis in either lower extremity. Electronically Signed   By: Bretta Bang III M.D.   On: 06/12/2016 08:20    EKG: Orders placed or performed during the hospital encounter of 06/12/16  . ED EKG  . ED EKG    IMPRESSION AND PLAN: Patient is a 60 year old with history of liver cirrhosis presents with leg pain and weakness  1. Symptomatic anemia Has a history of having anemia of chronic disease in the 7 range. Now worse. No overt bleeding noted Patient is consented for transfusion and 2 units of packed RBCs ordered. I will check iron, B12 and folate level GI consult for anemia  2. Lower extremity swelling likely related to liver cirrhosis Continue spinal lactone I'll place on IV Lasix monitor renal function tomorrow  3. Alcohol abuse  patient counseled regarding stop drinking CIWA protocol  4. Accelerated hypertension continue  clonidine will place her on IV hydralazine when necessary  5. Chronic kidney disease monitor renal function  6. Hypothyroidism continue Synthroid  7. Miscellaneous SCDs for DVT prophylaxis    All the records are reviewed and case discussed with ED provider. Management plans discussed with the patient, family and they are in agreement.  CODE STATUS:    Code Status Orders        Start     Ordered   06/12/16 1026  Full code   Continuous     06/12/16 1028    Code Status History    Date Active Date Inactive Code Status Order ID Comments User Context   02/11/2016  3:08 PM 02/13/2016 10:14 PM Full Code [id number]  Alford Highlandichard Wieting, MD ED   04/04/2015  5:48 AM 04/06/2015  5:51 PM Full Code [id number]  Oralia Manisavid Willis, MD Inpatient       TOTAL TIME TAKING CARE OF THIS PATIENT: 55 minutes.    Auburn BilberryPATEL, Arvis Miguez M.D on 06/12/2016 at 10:34 AM  Between 7am to 6pm - Pager - [phone]  After 6pm go to [email or web address] - password [email or web address]  [facility]  Office  [phone]  CC: Primary care physician; Presley RaddleAdrian Mancheno, MD

## 2016-06-12 NOTE — Care Management (Addendum)
RNCM consult received and will continue to follow. CSW consult placed for assistance. Patient was sent home last visit with rolling walker and home health through [facility]. I am not certain that HRI would benefit patient related to unhealthy behavior- doubt home health frequency can change that. However, patient also has sarcoidosis. Barbara CowerJason with [facility] is aware of patient return. I mentioned palliative assessment of patient to MD.

## 2016-06-13 ENCOUNTER — Inpatient Hospital Stay: Payer: Medicare Other

## 2016-06-13 DIAGNOSIS — K922 Gastrointestinal hemorrhage, unspecified: Secondary | ICD-10-CM | POA: Diagnosis not present

## 2016-06-13 DIAGNOSIS — D649 Anemia, unspecified: Secondary | ICD-10-CM | POA: Diagnosis not present

## 2016-06-13 LAB — PROTIME-INR
INR: 1.2
PROTHROMBIN TIME: 15.4 s — AB (ref 11.4–15.0)

## 2016-06-13 LAB — BASIC METABOLIC PANEL
ANION GAP: 6 (ref 5–15)
BUN: 20 mg/dL (ref 6–20)
CHLORIDE: 104 mmol/L (ref 101–111)
CO2: 28 mmol/L (ref 22–32)
CREATININE: 1.58 mg/dL — AB (ref 0.44–1.00)
Calcium: 9.1 mg/dL (ref 8.9–10.3)
GFR calc non Af Amer: 35 mL/min — ABNORMAL LOW (ref >60)
GFR, EST AFRICAN AMERICAN: 40 mL/min — AB (ref >60)
Glucose, Bld: 112 mg/dL — ABNORMAL HIGH (ref 65–99)
Potassium: 3.2 mmol/L — ABNORMAL LOW (ref 3.5–5.1)
SODIUM: 138 mmol/L (ref 135–145)

## 2016-06-13 LAB — CBC
HEMATOCRIT: 23.9 % — AB (ref 35.0–47.0)
HEMOGLOBIN: 7.9 g/dL — AB (ref 12.0–16.0)
MCH: 22.9 pg — ABNORMAL LOW (ref 26.0–34.0)
MCHC: 33.2 g/dL (ref 32.0–36.0)
MCV: 69.1 fL — ABNORMAL LOW (ref 80.0–100.0)
Platelets: 143 10*3/uL — ABNORMAL LOW (ref 150–440)
RBC: 3.46 MIL/uL — AB (ref 3.80–5.20)
RDW: 24.1 % — ABNORMAL HIGH (ref 11.5–14.5)
WBC: 3.2 10*3/uL — AB (ref 3.6–11.0)

## 2016-06-13 LAB — TYPE AND SCREEN
ABO/RH(D): A POS
Antibody Screen: NEGATIVE
UNIT DIVISION: 0
UNIT DIVISION: 0

## 2016-06-13 LAB — AMMONIA: Ammonia: 38 umol/L — ABNORMAL HIGH (ref 9–35)

## 2016-06-13 LAB — APTT: APTT: 34 s (ref 24–36)

## 2016-06-13 MED ORDER — SODIUM CHLORIDE 0.9 % IV SOLN
50.0000 mg | Freq: Once | INTRAVENOUS | Status: AC
  Administered 2016-06-13: 50 mg via INTRAVENOUS
  Filled 2016-06-13: qty 1

## 2016-06-13 MED ORDER — POTASSIUM CHLORIDE 10 MEQ/100ML IV SOLN
10.0000 meq | INTRAVENOUS | Status: AC
  Administered 2016-06-13 (×2): 10 meq via INTRAVENOUS
  Filled 2016-06-13 (×2): qty 100

## 2016-06-13 NOTE — Progress Notes (Signed)
[facility] - [place] at [facility]   PATIENT NAME: Kathryn [name]    MR#:  [id number]  DATE OF BIRTH:  [birth year]  SUBJECTIVE: Admitted for severe symptomatic anemia with hemoglobin 5.4, received 2 units of transfusion, patient hemoglobin improved to 1.9. And hematocrit 23.9. Patient scheduled to have ultrasound. And also gastroenterology supposed to see the patient. Patient had endoscopy last year may 2016  showing grade 2 esophageal varices with portal hypertensive gastropathy .  CHIEF COMPLAINT:   Chief Complaint  Patient presents with  . Leg Pain    Pt. states bilateral leg pain for the past 2 nights.    REVIEW OF SYSTEMS:   ROS CONSTITUTIONAL: No fever, fatigue or weakness.  EYES: No blurred or double vision.  EARS, NOSE, AND THROAT: No tinnitus or ear pain.  RESPIRATORY: No cough, shortness of breath, wheezing or hemoptysis.  CARDIOVASCULAR: No chest pain, orthopnea, edema.  GASTROINTESTINAL: No nausea, vomiting, diarrhea or abdominal pain.  GENITOURINARY: No dysuria, hematuria.  ENDOCRINE: No polyuria, nocturia,  HEMATOLOGY: No anemia, easy bruising or bleeding SKIN: No rash or lesion. MUSCULOSKELETAL: No joint pain or arthritis.   NEUROLOGIC: No tingling, numbness, weakness.  PSYCHIATRY: No anxiety or depression.   DRUG ALLERGIES:   Allergies  Allergen Reactions  . Hydrocodone-Acetaminophen Other (See Comments)    Reaction: pt can't take anything with tylenol due to her liver disease.   . Ibuprofen Other (See Comments)    Esophageal varices  . Naproxen Other (See Comments)    Esophageal varices  . Propoxyphene Other (See Comments)    Reaction: pt isn't sure, but knows she can't take it.   . Tamiflu  [Oseltamivir Phosphate] Other (See Comments)    Esophageal varices    VITALS:  Blood pressure 99/59, pulse 54, temperature 97.7 F (36.5 C), temperature source Oral, resp. rate 19, height 5\' 4"  (1.626 m), weight 86.229 kg (190 lb 1.6 oz),  SpO2 100 %.  PHYSICAL EXAMINATION:  GENERAL:  60 y.o.-year-old patient lying in the bed with no acute distress.  EYES: Pupils equal, round, reactive to light and accommodation. No scleral icterus. Extraocular muscles intact.  HEENT: Head atraumatic, normocephalic. Oropharynx and nasopharynx clear.  NECK:  Supple, no jugular venous distention. No thyroid enlargement, no tenderness.  LUNGS: Normal breath sounds bilaterally, no wheezing, rales,rhonchi or crepitation. No use of accessory muscles of respiration.  CARDIOVASCULAR: S1, S2 normal. No murmurs, rubs, or gallops.  ABDOMEN: Soft, nontender, nondistended. Bowel sounds present. No organomegaly or mass.  EXTREMITIES: No pedal edema, cyanosis, or clubbing.  NEUROLOGIC: Cranial nerves II through XII are intact. Muscle strength 5/5 in all extremities. Sensation intact. Gait not checked.  PSYCHIATRIC: The patient is alert and oriented x 3.  SKIN: No obvious rash, lesion, or ulcer.    LABORATORY PANEL:   CBC  Recent Labs Lab 06/13/16 0456  WBC 3.2*  HGB 7.9*  HCT 23.9*  PLT 143*   ------------------------------------------------------------------------------------------------------------------  Chemistries   Recent Labs Lab 06/12/16 0820 06/13/16 0456  NA 137 138  K 3.8 3.2*  CL 107 104  CO2 21* 28  GLUCOSE 80 112*  BUN 18 20  CREATININE 1.53* 1.58*  CALCIUM 8.8* 9.1  AST 103*  --   ALT 17  --   ALKPHOS 89  --   BILITOT 0.5  --    ------------------------------------------------------------------------------------------------------------------  Cardiac Enzymes  Recent Labs Lab 06/12/16 0820  TROPONINI <0.03   ------------------------------------------------------------------------------------------------------------------  RADIOLOGY:  Dg Chest 2 View  06/12/2016  CLINICAL  DATA:  Lower extremity edema for 2 days. Chest tightness and shortness of breath for 2 days EXAM: CHEST  2 VIEW COMPARISON:  February 11, 2016  FINDINGS: There is no edema or consolidation. Heart is upper normal in size with pulmonary vascularity within normal limits. No adenopathy. No pneumothorax no bone lesions. IMPRESSION: No edema or consolidation. Electronically Signed   By: Bretta Bang III M.D.   On: 06/12/2016 07:56   US Venous Img Lower Bilateral  06/12/2016  CLINICAL DATA:  Lower extremity pain and edema EXAM: BILATERAL LOWER EXTREMITY VENOUS DUPLEX ULTRASOUND TECHNIQUE: Gray-scale sonography with graded compression, as well as color Doppler and duplex ultrasound were performed to evaluate the lower extremity deep venous systems from the level of the common femoral vein and including the common femoral, femoral, profunda femoral, popliteal and calf veins including the posterior tibial, peroneal and gastrocnemius veins when visible. The superficial great saphenous vein was also interrogated. Spectral Doppler was utilized to evaluate flow at rest and with distal augmentation maneuvers in the common femoral, femoral and popliteal veins. COMPARISON:  None. FINDINGS: RIGHT LOWER EXTREMITY Common Femoral Vein: No evidence of thrombus. Normal compressibility, respiratory phasicity and response to augmentation. Saphenofemoral Junction: No evidence of thrombus. Normal compressibility and flow on color Doppler imaging. Profunda Femoral Vein: No evidence of thrombus. Normal compressibility and flow on color Doppler imaging. Femoral Vein: No evidence of thrombus. Normal compressibility, respiratory phasicity and response to augmentation. Popliteal Vein: No evidence of thrombus. Normal compressibility, respiratory phasicity and response to augmentation. Calf Veins: No evidence of thrombus. Normal compressibility and flow on color Doppler imaging. Superficial Great Saphenous Vein: No evidence of thrombus. Normal compressibility and flow on color Doppler imaging. Venous Reflux:  None. Other Findings:  None. LEFT LOWER EXTREMITY Common Femoral Vein: No  evidence of thrombus. Normal compressibility, respiratory phasicity and response to augmentation. Saphenofemoral Junction: No evidence of thrombus. Normal compressibility and flow on color Doppler imaging. Profunda Femoral Vein: No evidence of thrombus. Normal compressibility and flow on color Doppler imaging. Femoral Vein: No evidence of thrombus. Normal compressibility, respiratory phasicity and response to augmentation. Popliteal Vein: No evidence of thrombus. Normal compressibility, respiratory phasicity and response to augmentation. Calf Veins: No evidence of thrombus. Normal compressibility and flow on color Doppler imaging. Superficial Great Saphenous Vein: No evidence of thrombus. Normal compressibility and flow on color Doppler imaging. Venous Reflux:  None. Other Findings:  None. IMPRESSION: No evidence of deep venous thrombosis in either lower extremity. Electronically Signed   By: Bretta Bang III M.D.   On: 06/12/2016 08:20    EKG:   Orders placed or performed during the hospital encounter of 06/12/16  . ED EKG  . ED EKG    ASSESSMENT AND PLAN:   #1 acute on chronic anemia, symptomatic anemia, patient hemoglobin ranges around 7 and dropped to 5.4 on labs yesterday, patient received 2 units of packed RBC. No evidence of overt bleeding like hematemesis or melena. Patient already on PPIs. GI consulted for possible repeat EGD. Patient went for ultrasound of abdomen. After  blood transfusion patient feels better without further shortness of breath. #2 lower extremity swelling likely secondary to liver cirrhosis: Continue Lasix, Aldactone and monitor kidney function closely.continue IV lasix today to avoid fluid over load with blood transfusion. #3 heavy alcohol abuse,; continue withdrawal protocol. #4 chronic kidney disease stage III ; stable. #5 hypothyroidism ;continue Synthroid.    All the records are reviewed and case discussed with Care Management/Social Workerr. Management  plans  discussed with the patient, family and they are in agreement.  CODE STATUS: full  TOTAL TIME TAKING CARE OF THIS PATIENT: .   POSSIBLE D/C IN 1-2DAYS, DEPENDING ON CLINICAL CONDITION.   Katha Hamming M.D on 06/13/2016 at 7:40 AM  Between 7am to 6pm - Pager - [phone]  After 6pm go to [email or web address] - password [email or web address]  [facility]  Office  [phone]  CC: Primary care physician; Presley Raddle, MD   Note: This dictation was prepared with Dragon dictation along with smaller phrase technology. Any transcriptional errors that result from this process are unintentional.

## 2016-06-13 NOTE — Consult Note (Signed)
Patient with US showing cirrhosis, gallstones, borderline wall thickness.  Pt hungry, will start clear liq and advance to full liq as tolerated.  Chest clear, heart RRR, abd non tender to gentle pressure.  Hgb up 2 points with transfusion.  Due to very low iron I advised iron infusion.  Will repeat labs tomorrow and do EGD Monday.  No evidence of acute active bleeding, may have portal hypertensive gastropathy which can be a source of slow blood loss.  May need to band any varices if they are large.

## 2016-06-13 NOTE — Care Management Important Message (Signed)
Important Message  Patient Details  Name: NETTY DUBS MRN: [id number] Date of Birth: 08-06-1956   Medicare Important Message Given:  Yes    Lawerance Sabal, RN 06/13/2016, 11:49 AM

## 2016-06-14 DIAGNOSIS — D649 Anemia, unspecified: Secondary | ICD-10-CM | POA: Diagnosis not present

## 2016-06-14 DIAGNOSIS — K922 Gastrointestinal hemorrhage, unspecified: Secondary | ICD-10-CM | POA: Diagnosis not present

## 2016-06-14 LAB — BASIC METABOLIC PANEL
Anion gap: 7 (ref 5–15)
BUN: 21 mg/dL — AB (ref 6–20)
CALCIUM: 9.1 mg/dL (ref 8.9–10.3)
CHLORIDE: 104 mmol/L (ref 101–111)
CO2: 28 mmol/L (ref 22–32)
CREATININE: 1.71 mg/dL — AB (ref 0.44–1.00)
GFR calc non Af Amer: 32 mL/min — ABNORMAL LOW (ref >60)
GFR, EST AFRICAN AMERICAN: 37 mL/min — AB (ref >60)
Glucose, Bld: 82 mg/dL (ref 65–99)
Potassium: 3.4 mmol/L — ABNORMAL LOW (ref 3.5–5.1)
SODIUM: 139 mmol/L (ref 135–145)

## 2016-06-14 LAB — AFP TUMOR MARKER: AFP-Tumor Marker: 15.5 ng/mL — ABNORMAL HIGH (ref 0.0–8.3)

## 2016-06-14 NOTE — Consult Note (Signed)
Patient denies any bleeding, no abd pain, walked to bathroom OK,  I asked her nurse to ambulate her in the halls if possible.  I will do an EGD tomorrow.  Chest clear, heart mild bradycardia, no murmurs I can hear.  Abd non tender.  A: cirrhosis, hx of esophageal varices, Hx of portal hypertensive gastropathy plan for EGD tomorrow.  Patient in agreement.

## 2016-06-14 NOTE — Progress Notes (Signed)
[facility] Physicians - [place] at [facility]   PATIENT NAME: Kathryn [name]    MR#:  [id number]  DATE OF BIRTH:  19-Apr-1956  SUBJECTIVE: Getting full liquids. Denies any complaints. Heart rate is in 40s. Patient scheduled to have EGD tomorrow.   CHIEF COMPLAINT:   Chief Complaint  Patient presents with  . Leg Pain    Pt. states bilateral leg pain for the past 2 nights.    Admitted for severe symptomatic anemia with hemoglobin 5.4, received 2 units of transfusion, patient hemoglobin improved to 7.9 And hematocrit 23.9.     REVIEW OF SYSTEMS:   Review of Systems  Constitutional: Negative for chills, fever and malaise/fatigue.  Eyes: Negative.   Respiratory: Negative for cough, hemoptysis, sputum production and shortness of breath.   Cardiovascular: Negative.   Gastrointestinal: Negative for heartburn, nausea and vomiting.  Genitourinary: Negative for dysuria, frequency and urgency.  Musculoskeletal: Negative for back pain, myalgias and neck pain.  Skin: Negative for rash.  Neurological: Negative for dizziness and headaches.  Psychiatric/Behavioral: Negative for depression. The patient is not nervous/anxious.      DRUG ALLERGIES:   Allergies  Allergen Reactions  . Hydrocodone-Acetaminophen Other (See Comments)    Reaction: pt can't take anything with tylenol due to her liver disease.   . Ibuprofen Other (See Comments)    Esophageal varices  . Naproxen Other (See Comments)    Esophageal varices  . Propoxyphene Other (See Comments)    Reaction: pt isn't sure, but knows she can't take it.   . Tamiflu  [Oseltamivir Phosphate] Other (See Comments)    Esophageal varices    VITALS:  Blood pressure (!) 113/55, pulse (!) 48, temperature 98.1 F (36.7 C), resp. rate 17, height 5\' 4"  (1.626 m), weight 86.2 kg (190 lb 1.6 oz), SpO2 97 %.  PHYSICAL EXAMINATION:  GENERAL:  60 y.o.-year-old patient lying in the bed with no acute distress.  EYES: Pupils equal, round,  reactive to light and accommodation. No scleral icterus. Extraocular muscles intact.  HEENT: Head atraumatic, normocephalic. Oropharynx and nasopharynx clear.  NECK:  Supple, no jugular venous distention. No thyroid enlargement, no tenderness.  LUNGS: Normal breath sounds bilaterally, no wheezing, rales,rhonchi or crepitation. No use of accessory muscles of respiration.  CARDIOVASCULAR: S1, S2 normal. No murmurs, rubs, or gallops.  ABDOMEN: Soft, nontender, nondistended. Bowel sounds present. No organomegaly or mass.  EXTREMITIES: No pedal edema, cyanosis, or clubbing.  NEUROLOGIC: Cranial nerves II through XII are intact. Muscle strength 5/5 in all extremities. Sensation intact. Gait not checked.  PSYCHIATRIC: The patient is alert and oriented x 3.  SKIN: No obvious rash, lesion, or ulcer.    LABORATORY PANEL:   CBC  Recent Labs Lab 06/13/16 0456  WBC 3.2*  HGB 7.9*  HCT 23.9*  PLT 143*   ------------------------------------------------------------------------------------------------------------------  Chemistries   Recent Labs Lab 06/12/16 0820  06/14/16 0422  NA 137  < > 139  K 3.8  < > 3.4*  CL 107  < > 104  CO2 21*  < > 28  GLUCOSE 80  < > 82  BUN 18  < > 21*  CREATININE 1.53*  < > 1.71*  CALCIUM 8.8*  < > 9.1  AST 103*  --   --   ALT 17  --   --   ALKPHOS 89  --   --   BILITOT 0.5  --   --   < > = values in this interval not displayed. ------------------------------------------------------------------------------------------------------------------  Cardiac Enzymes  Recent Labs Lab 06/12/16 0820  TROPONINI <0.03   ------------------------------------------------------------------------------------------------------------------  RADIOLOGY:  US Abdomen Complete  Result Date: 06/13/2016 CLINICAL DATA:  Cirrhosis. EXAM: ABDOMEN ULTRASOUND COMPLETE COMPARISON:  09/25/2014. FINDINGS: Gallbladder: Nonmobile shadowing echogenic stones are seen in the neck,  measuring up to 7 mm. Gallbladder wall measures 4 mm. Negative sonographic Murphy sign. Common bile duct: Diameter: 3 mm, within normal limits. Liver: Appears heterogeneous and the margin appears irregular. IVC: No abnormality visualized. Pancreas: Tail is obscured by bowel gas, otherwise negative. Spleen: 9.1 cm, negative. Right Kidney: Length: 9.9 cm. 3.0 cm anechoic lesion with increased through transmission, consistent with a cyst. Parenchymal echogenicity is within normal limits. No hydronephrosis. No solid lesion. Left Kidney: Length: 9.8 cm. Somewhat suboptimally visualized due to bowel gas. Parenchymal echogenicity is grossly within normal limits. No definite hydronephrosis. Abdominal aorta: Mid and distal portions of the aorta are obscured by bowel gas. Otherwise, no aneurysm. Other findings: None. IMPRESSION: 1. Suboptimal examination due to bowel gas. No definite acute findings. 2. Cholelithiasis with mild gallbladder wall thickening. Findings can be seen with chronic cholecystitis. 3. Cirrhosis. Electronically Signed   By: Leanna Battles M.D.   On: 06/13/2016 10:48    EKG:   Orders placed or performed during the hospital encounter of 06/12/16  . ED EKG  . ED EKG    ASSESSMENT AND PLAN:   #1 acute on chronic anemia, symptomatic anemia, patient hemoglobin ranges around 7 and dropped to 5.4 on admission labs with  shortness of breath patient received 2 units of packed RBC. No evidence of overt bleeding like hematemesis or melena. Patient already on PPIs. An ultrasound of didn't show any ascites. Scheduled to have EGD tomorrow. Continue full liquids. Had  history of portal hypertensive gastropathy by EGD before. After  blood transfusion patient feels better without further shortness of breath. #2 lower extremity swelling likely secondary to liver cirrhosis: creatinine is fluctuating. Hold Lasix Aldactone for today.  #3 heavy alcohol abuse,; and not having any withdrawal symptoms.monitor  With  CIWA #4 chronic kidney disease stage III ; stable. #5 hypothyroidism ;continue Synthroid. #6 bradycardia likely due to clonidine, and  Nadolol.Hold both of them today.  All the records are reviewed and case discussed with Care Management/Social Workerr. Management plans discussed with the patient, family and they are in agreement.  CODE STATUS: full  TOTAL TIME TAKING CARE OF THIS PATIENT: .   POSSIBLE D/C IN 1-2DAYS, DEPENDING ON CLINICAL CONDITION.   Katha Hamming M.D on 06/14/2016 at 8:26 AM  Between 7am to 6pm - Pager - [phone]  After 6pm go to [email or web address] - password [email or web address]  [facility]  Office  [phone]  CC: Primary care physician; Presley Raddle, MD   Note: This dictation was prepared with Dragon dictation along with smaller phrase technology. Any transcriptional errors that result from this process are unintentional.

## 2016-06-15 DIAGNOSIS — K922 Gastrointestinal hemorrhage, unspecified: Secondary | ICD-10-CM | POA: Diagnosis not present

## 2016-06-15 DIAGNOSIS — D649 Anemia, unspecified: Secondary | ICD-10-CM | POA: Diagnosis not present

## 2016-06-15 LAB — CBC
HEMATOCRIT: 25.7 % — AB (ref 35.0–47.0)
HEMOGLOBIN: 8.4 g/dL — AB (ref 12.0–16.0)
MCH: 22.9 pg — AB (ref 26.0–34.0)
MCHC: 32.6 g/dL (ref 32.0–36.0)
MCV: 70.2 fL — ABNORMAL LOW (ref 80.0–100.0)
Platelets: 147 10*3/uL — ABNORMAL LOW (ref 150–440)
RBC: 3.66 MIL/uL — AB (ref 3.80–5.20)
RDW: 25.2 % — ABNORMAL HIGH (ref 11.5–14.5)
WBC: 3.6 10*3/uL (ref 3.6–11.0)

## 2016-06-15 LAB — BASIC METABOLIC PANEL
ANION GAP: 5 (ref 5–15)
BUN: 19 mg/dL (ref 6–20)
CO2: 28 mmol/L (ref 22–32)
Calcium: 8.8 mg/dL — ABNORMAL LOW (ref 8.9–10.3)
Chloride: 106 mmol/L (ref 101–111)
Creatinine, Ser: 1.66 mg/dL — ABNORMAL HIGH (ref 0.44–1.00)
GFR calc Af Amer: 38 mL/min — ABNORMAL LOW (ref >60)
GFR, EST NON AFRICAN AMERICAN: 33 mL/min — AB (ref >60)
GLUCOSE: 87 mg/dL (ref 65–99)
POTASSIUM: 3.2 mmol/L — AB (ref 3.5–5.1)
Sodium: 139 mmol/L (ref 135–145)

## 2016-06-15 MED ORDER — SPIRONOLACTONE 25 MG PO TABS
25.0000 mg | ORAL_TABLET | Freq: Every day | ORAL | Status: DC
  Administered 2016-06-15: 25 mg via ORAL
  Filled 2016-06-15: qty 1

## 2016-06-15 MED ORDER — NADOLOL 20 MG PO TABS
20.0000 mg | ORAL_TABLET | Freq: Every day | ORAL | Status: DC
  Administered 2016-06-15: 20 mg via ORAL
  Filled 2016-06-15: qty 1

## 2016-06-15 MED ORDER — THIAMINE HCL 50 MG PO TABS: 50.0000 mg | ORAL_TABLET | Freq: Every day | ORAL | 1 refills | Status: DC

## 2016-06-15 MED ORDER — ADULT MULTIVITAMIN W/MINERALS CH: 1.0000 | ORAL_TABLET | Freq: Every day | ORAL | 1 refills | Status: DC

## 2016-06-15 MED ORDER — FOLIC ACID 1 MG PO TABS: 1.0000 mg | ORAL_TABLET | Freq: Every day | ORAL | 1 refills | Status: DC

## 2016-06-15 MED ORDER — NADOLOL 20 MG PO TABS: 20.0000 mg | ORAL_TABLET | Freq: Every day | ORAL | 1 refills | Status: DC

## 2016-06-15 MED ORDER — POTASSIUM CHLORIDE CRYS ER 20 MEQ PO TBCR: 40.0000 meq | EXTENDED_RELEASE_TABLET | Freq: Two times a day (BID) | ORAL | Status: DC

## 2016-06-15 MED ORDER — POTASSIUM CHLORIDE CRYS ER 20 MEQ PO TBCR: 40.0000 meq | EXTENDED_RELEASE_TABLET | Freq: Every day | ORAL | Status: DC

## 2016-06-15 NOTE — Progress Notes (Signed)
Patient discharging home. Instructions and prescriptions given to patient. Verbalized understanding. Patient's daughter called and notified of discharge, daughter states she will find someone to come pick patient up in a bit.

## 2016-06-15 NOTE — Care Management Important Message (Signed)
Important Message  Patient Details  Name: AURORE CUTINO MRN: [id number] Date of Birth: September 15, 1956   Medicare Important Message Given:  Yes    Adonis Huguenin, RN 06/15/2016, 10:15 AM

## 2016-06-15 NOTE — Discharge Summary (Signed)
[facility] - [place] at [facility]   PATIENT NAME: Kathryn [name]    MR#:  [id number]  DATE OF BIRTH:  1956/10/07  DATE OF ADMISSION:  06/12/2016 ADMITTING PHYSICIAN: Kathryn Bilberry, MD  DATE OF DISCHARGE: 06/15/16  PRIMARY CARE PHYSICIAN: Kathryn Raddle, MD    ADMISSION DIAGNOSIS:  Anemia, unspecified anemia type [D64.9]  DISCHARGE DIAGNOSIS:  Acute on chronic anemia due to slow GI bleed (EGD as out pt) Chronic cirrhosis of liver-alcohol induced H/o portal HTN and esophageal varices  SECONDARY DIAGNOSIS:   Past Medical History:  Diagnosis Date  . Alcoholic cirrhosis of liver (HCC)   . Asthma   . CHF (congestive heart failure) (HCC)   . Chronic disease anemia   . Esophageal varices (HCC)    GR I on EGD by Kathryn Kathryn [name] 09/2014  . ETOH abuse   . Hypertension   . Hypothyroidism   . Murmur   . Portal hypertension (HCC)   . Sarcoidosis [facility])     HOSPITAL COURSE:   Kathryn [name] a 61 y.o.femalewith a known history of Asthma, sarcoidosis, hypothyroidism, anemia of chronic disease, esophageal varices, liver cirrhosis due to alcohol abuse and persistent alcohol abuse presents with complaint of having leg pain and swelling in both of his lower extremities  #1 acute on chronic anemia, symptomatic anemia, patient hemoglobin ranges around 7 and dropped to 5.4 on admission labs with shortness of breathpatient received 2 units of packed RBC - No evidence of overt bleeding like hematemesis or melena. Patient already on PPIs.  - ultrasound of abdomen did not show any ascites.  -Scheduled to have EGD today but there is hospital wide issue with A/C and procedures have been cancelled. -spoke with Kathryn [name] ok to d/c pt and get it done as out pt. -pt agreeable -pt has history of portal hypertensive gastropathy by EGD before. -hgb 5.2---2 units BT---7.9---8.2  #2 lower extremity swelling likely secondary to liver cirrhosis: -creat is at baseline Resume   Aldactone   #3 h/o alcohol abuse -pt not having any withdrawal symptoms.monitor With CIWA Pt advised ETOH cessation   #4 chronic kidney disease stage III ; stable.  #5 hypothyroidism ;continue Synthroid.  #6 bradycardia likely due to clonidine and Nadolol. -d/ced clonidine -restart Nadolol at 20 mg qd  D/c home CONSULTS OBTAINED:  Treatment Team:  Kathryn Jun, MD  DRUG ALLERGIES:   Allergies  Allergen Reactions  . Hydrocodone-Acetaminophen Other (See Comments)    Reaction: pt can't take anything with tylenol due to her liver disease.   . Ibuprofen Other (See Comments)    Esophageal varices  . Naproxen Other (See Comments)    Esophageal varices  . Propoxyphene Other (See Comments)    Reaction: pt isn't sure, but knows she can't take it.   . Tamiflu  [Oseltamivir Phosphate] Other (See Comments)    Esophageal varices    DISCHARGE MEDICATIONS:   Current Discharge Medication List    START taking these medications   Details  folic acid (FOLVITE) 1 MG tablet Take 1 tablet (1 mg total) by mouth daily. Qty: 30 tablet, Refills: 1    Multiple Vitamin (MULTIVITAMIN WITH MINERALS) TABS tablet Take 1 tablet by mouth daily. Qty: 30 tablet, Refills: 1    thiamine 50 MG tablet Take 1 tablet (50 mg total) by mouth daily. Qty: 30 tablet, Refills: 1      CONTINUE these medications which have CHANGED   Details  nadolol (CORGARD) 20 MG tablet Take 1 tablet (20 mg  total) by mouth daily. Qty: 30 tablet, Refills: 1      CONTINUE these medications which have NOT CHANGED   Details  levothyroxine (SYNTHROID, LEVOTHROID) 175 MCG tablet Take 175 mcg by mouth daily before breakfast.     pantoprazole (PROTONIX) 40 MG tablet Take 1 tablet (40 mg total) by mouth 2 (two) times daily before a meal. Qty: 60 tablet, Refills: 1    spironolactone (ALDACTONE) 25 MG tablet Take 1 tablet (25 mg total) by mouth daily. Qty: 30 tablet, Refills: 0      STOP taking these medications      cloNIDine (CATAPRES) 0.1 MG tablet         If you experience worsening of your admission symptoms, develop shortness of breath, life threatening emergency, suicidal or homicidal thoughts you must seek medical attention immediately by calling 911 or calling your MD immediately  if symptoms less severe.  You Must read complete instructions/literature along with all the possible adverse reactions/side effects for all the Medicines you take and that have been prescribed to you. Take any new Medicines after you have completely understood and accept all the possible adverse reactions/side effects.   Please note  You were cared for by a hospitalist during your hospital stay. If you have any questions about your discharge medications or the care you received while you were in the hospital after you are discharged, you can call the unit and asked to speak with the hospitalist on call if the hospitalist that took care of you is not available. Once you are discharged, your primary care physician will handle any further medical issues. Please note that NO REFILLS for any discharge medications will be authorized once you are discharged, as it is imperative that you return to your primary care physician (or establish a relationship with a primary care physician if you do not have one) for your aftercare needs so that they can reassess your need for medications and monitor your lab values.  DATA REVIEW:   CBC   Recent Labs Lab 06/15/16 0358  WBC 3.6  HGB 8.4*  HCT 25.7*  PLT 147*    Chemistries   Recent Labs Lab 06/12/16 0820  06/15/16 0358  NA 137  < > 139  K 3.8  < > 3.2*  CL 107  < > 106  CO2 21*  < > 28  GLUCOSE 80  < > 87  BUN 18  < > 19  CREATININE 1.53*  < > 1.66*  CALCIUM 8.8*  < > 8.8*  AST 103*  --   --   ALT 17  --   --   ALKPHOS 89  --   --   BILITOT 0.5  --   --   < > = values in this interval not displayed.  Microbiology Results   No results found for this or any  previous visit (from the past 240 hour(s)).  RADIOLOGY:  No results found.   Management plans discussed with the patient, family and they are in agreement.  CODE STATUS:     Code Status Orders        Start     Ordered   06/12/16 1026  Full code  Continuous     06/12/16 1028    Code Status History    Date Active Date Inactive Code Status Order ID Comments User Context   02/11/2016  3:08 PM 02/13/2016 10:14 PM Full Code [id number]  Alford Highland, MD ED   04/04/2015  5:48  AM 04/06/2015  5:51 PM Full Code [id number]  Oralia Manis, MD Inpatient      TOTAL TIME TAKING CARE OF THIS PATIENT: 40 minutes.    Aahil Fredin M.D on 06/15/2016 at 9:12 AM  Between 7am to 6pm - Pager - [phone] After 6pm go to [email or web address] - password [email or web address]  [place] [facility]  Office  [phone]  CC: Primary care physician; Kathryn Raddle, MD

## 2016-06-15 NOTE — Discharge Instructions (Signed)
STOP DRINKING ALCOHOL °

## 2016-06-15 NOTE — Progress Notes (Addendum)
[facility] - [place] at [facility]   PATIENT NAME: Kathryn [name]    MR#:  [id number]  DATE OF BIRTH:  1956/06/01  SUBJECTIVE:  Denies any symptoms. No emesis   REVIEW OF SYSTEMS:   Review of Systems  Constitutional: Negative for chills, fever and weight loss.  HENT: Negative for ear discharge, ear pain and nosebleeds.   Eyes: Negative for blurred vision, pain and discharge.  Respiratory: Negative for sputum production, shortness of breath, wheezing and stridor.   Cardiovascular: Negative for chest pain, palpitations, orthopnea and PND.  Gastrointestinal: Negative for abdominal pain, diarrhea, nausea and vomiting.  Genitourinary: Negative for frequency and urgency.  Musculoskeletal: Negative for back pain and joint pain.  Neurological: Negative for sensory change, speech change, focal weakness and weakness.  Psychiatric/Behavioral: Negative for depression and hallucinations. The patient is not nervous/anxious.    Tolerating Diet:npo today Tolerating PT: not needed  DRUG ALLERGIES:   Allergies  Allergen Reactions  . Hydrocodone-Acetaminophen Other (See Comments)    Reaction: pt can't take anything with tylenol due to her liver disease.   . Ibuprofen Other (See Comments)    Esophageal varices  . Naproxen Other (See Comments)    Esophageal varices  . Propoxyphene Other (See Comments)    Reaction: pt isn't sure, but knows she can't take it.   . Tamiflu  [Oseltamivir Phosphate] Other (See Comments)    Esophageal varices    VITALS:  Blood pressure 116/64, pulse (!) 52, temperature 98.4 F (36.9 C), temperature source Oral, resp. rate 19, height  (1.626 m), weight 86.2 kg (190 lb 1.6 oz), SpO2 99 %.  PHYSICAL EXAMINATION:   Physical Exam  GENERAL:  60 y.o.-year-old patient lying in the bed with no acute distress. pallor EYES: Pupils equal, round, reactive to light and accommodation. No scleral icterus. Extraocular muscles intact.  HEENT: Head  atraumatic, normocephalic. Oropharynx and nasopharynx clear.  NECK:  Supple, no jugular venous distention. No thyroid enlargement, no tenderness.  LUNGS: Normal breath sounds bilaterally, no wheezing, rales, rhonchi. No use of accessory muscles of respiration.  CARDIOVASCULAR: S1, S2 normal. No murmurs, rubs, or gallops.  ABDOMEN: Soft, nontender, nondistended. Bowel sounds present. No organomegaly or mass.  EXTREMITIES: No cyanosis, clubbing or edema b/l.    NEUROLOGIC: Cranial nerves II through XII are intact. No focal Motor or sensory deficits b/l.   PSYCHIATRIC:  patient is alert and oriented x 3.  SKIN: No obvious rash, lesion, or ulcer.   LABORATORY PANEL:  CBC  Recent Labs Lab 06/15/16 0358  WBC 3.6  HGB 8.4*  HCT 25.7*  PLT 147*    Chemistries   Recent Labs Lab 06/12/16 0820  06/15/16 0358  NA 137  < > 139  K 3.8  < > 3.2*  CL 107  < > 106  CO2 21*  < > 28  GLUCOSE 80  < > 87  BUN 18  < > 19  CREATININE 1.53*  < > 1.66*  CALCIUM 8.8*  < > 8.8*  AST 103*  --   --   ALT 17  --   --   ALKPHOS 89  --   --   BILITOT 0.5  --   --   < > = values in this interval not displayed. Cardiac Enzymes  Recent Labs Lab 06/12/16 0820  TROPONINI <0.03   RADIOLOGY:  US Abdomen Complete  Result Date: 06/13/2016 CLINICAL DATA:  Cirrhosis. EXAM: ABDOMEN ULTRASOUND COMPLETE COMPARISON:  09/25/2014. FINDINGS: Gallbladder:  Nonmobile shadowing echogenic stones are seen in the neck, measuring up to 7 mm. Gallbladder wall measures 4 mm. Negative sonographic Murphy sign. Common bile duct: Diameter: 3 mm, within normal limits. Liver: Appears heterogeneous and the margin appears irregular. IVC: No abnormality visualized. Pancreas: Tail is obscured by bowel gas, otherwise negative. Spleen: 9.1 cm, negative. Right Kidney: Length: 9.9 cm. 3.0 cm anechoic lesion with increased through transmission, consistent with a cyst. Parenchymal echogenicity is within normal limits. No hydronephrosis. No  solid lesion. Left Kidney: Length: 9.8 cm. Somewhat suboptimally visualized due to bowel gas. Parenchymal echogenicity is grossly within normal limits. No definite hydronephrosis. Abdominal aorta: Mid and distal portions of the aorta are obscured by bowel gas. Otherwise, no aneurysm. Other findings: None. IMPRESSION: 1. Suboptimal examination due to bowel gas. No definite acute findings. 2. Cholelithiasis with mild gallbladder wall thickening. Findings can be seen with chronic cholecystitis. 3. Cirrhosis. Electronically Signed   By: Leanna Battles M.D.   On: 06/13/2016 10:48   ASSESSMENT AND PLAN:    Kathryn [name]  is a 60 y.o. female with a known history of Asthma, sarcoidosis, hypothyroidism, anemia of chronic disease, esophageal varices, liver cirrhosis due to alcohol abuse and persistent alcohol abuse presents with complaint of having leg pain and swelling in both of his lower extremities  #1 acute on chronic anemia, symptomatic anemia, patient hemoglobin ranges around 7 and dropped to 5.4 on admission labs with  shortness of breath patient received 2 units of packed RBC - No evidence of overt bleeding like hematemesis or melena. Patient already on PPIs.  - ultrasound of abdomen did not show any ascites.  -Scheduled to have EGD today -pt has  history of portal hypertensive gastropathy by EGD before. -hgb 5.2---2 units BT---7.9---8.2  #2 lower extremity swelling likely secondary to liver cirrhosis: -creat is at baseline Resume  Aldactone   #3 h/o alcohol abuse - pt not having any withdrawal symptoms.monitor  With CIWA Pt advised ETOH cessation   #4 chronic kidney disease stage III ; stable.  #5 hypothyroidism ;continue Synthroid.  #6 bradycardia likely due to clonidine and  Nadolol. -d/ced clonidine -restart Nadolol at 20 mg qd  Case discussed with Care Management/Social Worker. Management plans discussed with the patient, family and they are in agreement.  CODE STATUS: full  DVT  Prophylaxis: scd  TOTAL TIME TAKING CARE OF THIS PATIENT: 25 minutes.  >50% time spent on counselling and coordination of care  POSSIBLE D/C IN 1 DAYS, DEPENDING ON if EGD is done today Note: This dictation was prepared with Dragon dictation along with smaller phrase technology. Any transcriptional errors that result from this process are unintentional.  Kathryn [name] M.D on 06/15/2016 at 7:36 AM  Between 7am to 6pm - Pager - [phone]  After 6pm go to [email or web address] - password [email or web address] [facility]  [place] [facility]  Office  [phone]  CC: Primary care physician; Presley Raddle, MD

## 2016-06-15 NOTE — Care Management Note (Signed)
Case Management Note  Patient Details  Name: Kathryn [name] MRN: [id number] Date of Birth: 18-Mar-1956  Subjective/Objective:     Spoke with patient for continued discharge planning and assessment. Patient is alert and answers questions appropriately. Patient stated that she lives at home with her daughter and her grand daughter. She has a front rolling walker but she stated that she only uses it "sometimes". Offered choice of home health agencies but patient stated that she does not want these services at this time. She stated that she had recently had [facility] and was released by them. She stated that she intends on moving in with one of her sisters after discharge. She would not elaborate on this only stating that she has 7 sisters and she doesn't know which one she will be living with. I informed patient that she would be discharging today and she stated that she would have a ride today. Patient expressed understanding that she would have a follow up for her EGD as an outpatient. She stated that her PCP is Lynda Rainwater. Pharmacy is [facility].               Action/Plan:   Expected Discharge Date:                  Expected Discharge Plan:     In-House Referral:     Discharge planning Services  CM Consult  Post Acute Care Choice:  Home Health Choice offered to:  Patient  DME Arranged:  N/A DME Agency:  NA  HH Arranged:  NA (Patient refused Home Health services.) HH Agency:     Status of Service:  Completed, signed off  If discussed at Long Length of Stay Meetings, dates discussed:    Additional Comments:  Adonis Huguenin, RN 06/15/2016, 9:53 AM

## 2016-06-16 SURGERY — EGD (ESOPHAGOGASTRODUODENOSCOPY)
Anesthesia: General

## 2017-06-29 ENCOUNTER — Other Ambulatory Visit: Payer: Self-pay | Admitting: Family Medicine

## 2017-06-29 DIAGNOSIS — K7030 Alcoholic cirrhosis of liver without ascites: Secondary | ICD-10-CM

## 2018-01-01 ENCOUNTER — Encounter: Payer: Self-pay | Admitting: Emergency Medicine

## 2018-01-01 ENCOUNTER — Emergency Department
Admission: EM | Admit: 2018-01-01 | Discharge: 2018-01-01 | Disposition: A | Discharge status: Home / Self Care | Payer: Medicare Other | Attending: Emergency Medicine | Admitting: Emergency Medicine

## 2018-01-01 ENCOUNTER — Other Ambulatory Visit: Payer: Self-pay

## 2018-01-01 DIAGNOSIS — J45909 Unspecified asthma, uncomplicated: Secondary | ICD-10-CM | POA: Insufficient documentation

## 2018-01-01 DIAGNOSIS — R52 Pain, unspecified: Secondary | ICD-10-CM | POA: Diagnosis not present

## 2018-01-01 DIAGNOSIS — I110 Hypertensive heart disease with heart failure: Secondary | ICD-10-CM | POA: Insufficient documentation

## 2018-01-01 DIAGNOSIS — R05 Cough: Secondary | ICD-10-CM | POA: Diagnosis present

## 2018-01-01 DIAGNOSIS — Z79899 Other long term (current) drug therapy: Secondary | ICD-10-CM | POA: Insufficient documentation

## 2018-01-01 DIAGNOSIS — E039 Hypothyroidism, unspecified: Secondary | ICD-10-CM | POA: Insufficient documentation

## 2018-01-01 DIAGNOSIS — I509 Heart failure, unspecified: Secondary | ICD-10-CM | POA: Diagnosis not present

## 2018-01-01 DIAGNOSIS — J209 Acute bronchitis, unspecified: Secondary | ICD-10-CM | POA: Diagnosis not present

## 2018-01-01 MED ORDER — PSEUDOEPH-BROMPHEN-DM 30-2-10 MG/5ML PO SYRP: 5.0000 mL | ORAL_SOLUTION | Freq: Four times a day (QID) | ORAL | 0 refills | Status: DC | PRN

## 2018-01-01 MED ORDER — AZITHROMYCIN 250 MG PO TABS: ORAL_TABLET | ORAL | 0 refills | Status: AC

## 2018-01-01 NOTE — Discharge Instructions (Signed)
Advised Tylenol ibuprofen for body aches and headache.

## 2018-01-01 NOTE — ED Provider Notes (Signed)
[facility] Emergency Department Provider Note   ____________________________________________   First MD Initiated Contact with Patient 01/01/18 1229     (approximate)  I have reviewed the triage vital signs and the nursing notes.   HISTORY  Chief Complaint Cough and Generalized Body Aches    HPI Kathryn [name] is a 62 y.o. female patient complaint of cough and body aches for 1 day.  Patient also states nasal congestion and postnasal drainage.  Patient stated mild wheezing with productive cough.  Patient denies nausea, vomiting, diarrhea.  Patient is taking flu and pneumonia shot for this season.  Patient rates the pain is 7/10.  No palliative measures for complaint.  Past Medical History:  Diagnosis Date  . Alcoholic cirrhosis of liver (HCC)   . Asthma   . CHF (congestive heart failure) (HCC)   . Chronic disease anemia   . Esophageal varices (HCC)    GR I on EGD by Dr Shelle Iron 09/2014  . ETOH abuse   . Hypertension   . Hypothyroidism   . Murmur   . Portal hypertension (HCC)   . Sarcoidosis     Patient Active Problem List   Diagnosis Date Noted  . Anemia 06/12/2016  . Hypoglycemia 02/11/2016  . Hematemesis 04/04/2015  . Cirrhosis (HCC) 04/04/2015  . Esophageal varices (HCC) 04/04/2015  . Hypothyroid 04/04/2015  . Hypertension 04/04/2015  . Chest pain 04/04/2015    Past Surgical History:  Procedure Laterality Date  . ESOPHAGOGASTRODUODENOSCOPY (EGD) WITH PROPOFOL N/A 04/04/2015   Procedure: ESOPHAGOGASTRODUODENOSCOPY (EGD) WITH PROPOFOL;  Surgeon: Midge Minium, MD;  Location: [facility] ENDOSCOPY;  Service: Endoscopy;  Laterality: N/A;  Dr Servando Snare prefers [phone]  . ESOPHAGOGASTRODUODENOSCOPY W/ BANDING  09/2014   [place]in  . TUBAL LIGATION      Prior to Admission medications   Medication Sig Start Date End Date Taking? Authorizing Provider  azithromycin (ZITHROMAX Z-PAK) 250 MG tablet Take 2 tablets (500 mg) on  Day 1,  followed by 1 tablet (250 mg) once  daily on Days 2 through 5. 01/01/18 01/06/18  Joni Reining, PA-C  brompheniramine-pseudoephedrine-DM 30-2-10 MG/5ML syrup Take 5 mLs by mouth 4 (four) times daily as needed. 01/01/18   Joni Reining, PA-C  folic acid (FOLVITE) 1 MG tablet Take 1 tablet (1 mg total) by mouth daily. 06/15/16   Enedina Finner, MD  levothyroxine (SYNTHROID, LEVOTHROID) 175 MCG tablet Take 175 mcg by mouth daily before breakfast.     [provider]  Multiple Vitamin (MULTIVITAMIN WITH MINERALS) TABS tablet Take 1 tablet by mouth daily. 06/15/16   Enedina Finner, MD  nadolol (CORGARD) 20 MG tablet Take 1 tablet (20 mg total) by mouth daily. 06/15/16   Enedina Finner, MD  pantoprazole (PROTONIX) 40 MG tablet Take 1 tablet (40 mg total) by mouth 2 (two) times daily before a meal. Patient taking differently: Take 40 mg by mouth daily.  04/06/15   Altamese Dilling, MD  spironolactone (ALDACTONE) 25 MG tablet Take 1 tablet (25 mg total) by mouth daily. 04/06/15   Altamese Dilling, MD  thiamine 50 MG tablet Take 1 tablet (50 mg total) by mouth daily. 06/15/16   Enedina Finner, MD    Allergies Hydrocodone-acetaminophen; Ibuprofen; Naproxen; Propoxyphene; and Tamiflu  [oseltamivir phosphate]  Family History  Problem Relation Age of Onset  . Aneurysm Father   . COPD Father   . Heart attack Mother     Social History Social History   Tobacco Use  . Smoking status: Never  Smoker  Substance Use Topics  . Alcohol use: Yes    Alcohol/week: 9.0 oz    Types: 15 Shots of liquor per week    Comment: Quit ETOH- 04/04/15, Heavy Use prior to this  . Drug use: No    Review of Systems Constitutional: No fever/chills.  He Eyes: No visual changes. ENT: No sore throat.  Nasal congestion Cardiovascular: Denies chest pain. Respiratory: Denies shortness of breath. Gastrointestinal: No abdominal pain.  No nausea, no vomiting.  No diarrhea.  No constipation. Genitourinary: Negative for dysuria. Musculoskeletal: Negative for  back pain. Skin: Negative for rash. Neurological: Positive for headaches, but denies focal weakness or numbness. Psychiatric:EtOH abuse Endocrine:Hypertension hypothyroidism Hematological/Lymphatic:Alcohol-induced cirrhosis of the liver. Allergic/Immunilogical: See medication list ____________________________________________   PHYSICAL EXAM:  VITAL SIGNS: ED Triage Vitals  Enc Vitals Group     BP 01/01/18 1218 (!) 159/86     Pulse Rate 01/01/18 1218 77     Resp 01/01/18 1218 18     Temp 01/01/18 1218 98.4 F (36.9 C)     Temp Source 01/01/18 1218 Oral     SpO2 01/01/18 1218 97 %     Weight 01/01/18 1219 170 lb (77.1 kg)     Height 01/01/18 1219 5\' 4"  (1.626 m)     Head Circumference --      Peak Flow --      Pain Score 01/01/18 1219 7     Pain Loc --      Pain Edu? --      Excl. in GC? --     Constitutional: Alert and oriented. Well appearing and in no acute distress. Nose: Edematous nasal turbinates with yellow rhinorrhea Mouth/Throat: Mucous membranes are moist.  Oropharynx non-erythematous.  Postnasal drainage Neck: No stridor.  Hematological/Lymphatic/Immunilogical: No cervical lymphadenopathy. Cardiovascular: Normal rate, regular rhythm. Grossly normal heart sounds.  Good peripheral circulation. Respiratory: Normal respiratory effort.  No retractions.  Bilateral upper lobe rales Musculoskeletal: No lower extremity tenderness nor edema.  No joint effusions. Neurologic:  Normal speech and language. No gross focal neurologic deficits are appreciated. No gait instability. Skin:  Skin is warm, dry and intact. No rash noted. Psychiatric: Mood and affect are normal. Speech and behavior are normal.  ____________________________________________   LABS (all labs ordered are listed, but only abnormal results are displayed)  Labs Reviewed - No data to  display ____________________________________________  EKG   ____________________________________________  RADIOLOGY  ED MD interpretation:    Official radiology report(s): No results found.  ____________________________________________   PROCEDURES  Procedure(s) performed: None  Procedures  Critical Care performed: No  ____________________________________________   INITIAL IMPRESSION / ASSESSMENT AND PLAN / ED COURSE  As part of my medical decision making, I reviewed the following data within the electronic MEDICAL RECORD NUMBER    Cough secondary to bronchitis.  Patient given discharge care instruction.  Patient advised take medication as directed.  Patient advised follow-up PCP if no improvement 3-5 days.      ____________________________________________   FINAL CLINICAL IMPRESSION(S) / ED DIAGNOSES  Final diagnoses:  Acute bronchitis, unspecified organism     ED Discharge Orders        Ordered    azithromycin (ZITHROMAX Z-PAK) 250 MG tablet     01/01/18 1243    brompheniramine-pseudoephedrine-DM 30-2-10 MG/5ML syrup  4 times daily PRN     01/01/18 1243       Note:  This document was prepared using Dragon voice recognition software and may include unintentional dictation errors.  Joni ReiningSmith, Swade Shonka K, PA-C 01/01/18 1250    Governor RooksLord, Rebecca, MD 01/01/18 [phone]

## 2018-01-01 NOTE — ED Triage Notes (Signed)
Cough and body aches x 1 day

## 2018-04-06 ENCOUNTER — Other Ambulatory Visit: Payer: Self-pay | Admitting: Family Medicine

## 2018-04-06 DIAGNOSIS — Z1231 Encounter for screening mammogram for malignant neoplasm of breast: Secondary | ICD-10-CM

## 2018-09-20 ENCOUNTER — Other Ambulatory Visit: Payer: Self-pay | Admitting: Family Medicine

## 2018-09-20 DIAGNOSIS — Z1231 Encounter for screening mammogram for malignant neoplasm of breast: Secondary | ICD-10-CM

## 2018-10-26 ENCOUNTER — Encounter: Payer: Self-pay | Admitting: Family Medicine

## 2018-12-06 ENCOUNTER — Telehealth: Payer: Self-pay

## 2018-12-06 NOTE — Telephone Encounter (Signed)
Contacted patient to schedule her for colonoscopy.  She states that she did not want to have a colonoscopy scheduled.  Referral canceled per patients request.  Thanks Marcelino Duster

## 2019-03-24 ENCOUNTER — Other Ambulatory Visit: Payer: Self-pay | Admitting: Family Medicine

## 2019-03-24 DIAGNOSIS — Z1231 Encounter for screening mammogram for malignant neoplasm of breast: Secondary | ICD-10-CM

## 2019-10-26 ENCOUNTER — Emergency Department: Payer: Medicare Other

## 2019-10-26 ENCOUNTER — Emergency Department
Admission: EM | Admit: 2019-10-26 | Discharge: 2019-10-27 | Disposition: A | Discharge status: Home / Self Care | Payer: Medicare Other | Attending: Emergency Medicine | Admitting: Emergency Medicine

## 2019-10-26 ENCOUNTER — Other Ambulatory Visit: Payer: Self-pay

## 2019-10-26 DIAGNOSIS — W19XXXA Unspecified fall, initial encounter: Secondary | ICD-10-CM

## 2019-10-26 DIAGNOSIS — F1010 Alcohol abuse, uncomplicated: Secondary | ICD-10-CM | POA: Diagnosis not present

## 2019-10-26 DIAGNOSIS — Z79899 Other long term (current) drug therapy: Secondary | ICD-10-CM | POA: Diagnosis not present

## 2019-10-26 DIAGNOSIS — M545 Low back pain, unspecified: Secondary | ICD-10-CM

## 2019-10-26 DIAGNOSIS — I509 Heart failure, unspecified: Secondary | ICD-10-CM | POA: Insufficient documentation

## 2019-10-26 DIAGNOSIS — Y998 Other external cause status: Secondary | ICD-10-CM | POA: Diagnosis not present

## 2019-10-26 DIAGNOSIS — S0990XA Unspecified injury of head, initial encounter: Secondary | ICD-10-CM | POA: Insufficient documentation

## 2019-10-26 DIAGNOSIS — Z7289 Other problems related to lifestyle: Secondary | ICD-10-CM

## 2019-10-26 DIAGNOSIS — J45909 Unspecified asthma, uncomplicated: Secondary | ICD-10-CM | POA: Diagnosis not present

## 2019-10-26 DIAGNOSIS — E039 Hypothyroidism, unspecified: Secondary | ICD-10-CM | POA: Diagnosis not present

## 2019-10-26 DIAGNOSIS — Y92018 Other place in single-family (private) house as the place of occurrence of the external cause: Secondary | ICD-10-CM | POA: Diagnosis not present

## 2019-10-26 DIAGNOSIS — Y9301 Activity, walking, marching and hiking: Secondary | ICD-10-CM | POA: Diagnosis not present

## 2019-10-26 DIAGNOSIS — I110 Hypertensive heart disease with heart failure: Secondary | ICD-10-CM | POA: Insufficient documentation

## 2019-10-26 DIAGNOSIS — W010XXA Fall on same level from slipping, tripping and stumbling without subsequent striking against object, initial encounter: Secondary | ICD-10-CM | POA: Insufficient documentation

## 2019-10-26 DIAGNOSIS — Z789 Other specified health status: Secondary | ICD-10-CM

## 2019-10-26 NOTE — ED Notes (Addendum)
Pt states she fell at home. Denies loc and hitting her head. She thinks she tripped on something. Pt states her pain is in the lower left part of her back. Pt alert & oriented with NAD noted.   She also reports that she has had some back pain since last Thursday from "doing too much."

## 2019-10-26 NOTE — ED Triage Notes (Signed)
Pt brought in by EMS for fall, pt had been drinking throughout the day. Pt co lower back pain at this time. Denies any loc, no head or neck pain.

## 2019-10-26 NOTE — ED Provider Notes (Signed)
[facility] Emergency Department Provider Note  ____________________________________________  Time seen: Approximately 12:00 AM  I have reviewed the triage vital signs and the nursing notes.   HISTORY  Chief Complaint Fall    HPI Kathryn [name] is a 63 y.o. female that presents to the emergency department for evaluation after fall today.  Patient was at her daughter's birthday and having cocktails.  Patient estimates having 3 cocktails.  She was walking and stumbled, causing her to fall.  She landed on her left side.  She did not hit her head or lose consciousness.  She does not think that she hit the ground very hard because her grandson helped catch her.  She is primarily sore to her left low back.  Her daughter will be taking her home today.  Patient denies any dizziness, abnormal feelings, chest pain, any other symptoms prior to fall.  Patient denies headache, shortness breath, chest pain, abdominal pain.   Past Medical History:  Diagnosis Date  . Alcoholic cirrhosis of liver ([place])   . Asthma   . CHF (congestive heart failure) ([place])   . Chronic disease anemia   . Esophageal varices ([place])    GR I on EGD by Dr Rayann Heman 09/2014  . ETOH abuse   . Hypertension   . Hypothyroidism   . Murmur   . Portal hypertension ([place])   . Sarcoidosis     Patient Active Problem List   Diagnosis Date Noted  . Anemia 06/12/2016  . Hypoglycemia 02/11/2016  . Hematemesis 04/04/2015  . Cirrhosis ([place]) 04/04/2015  . Esophageal varices ([place]) 04/04/2015  . Hypothyroid 04/04/2015  . Hypertension 04/04/2015  . Chest pain 04/04/2015    Past Surgical History:  Procedure Laterality Date  . ESOPHAGOGASTRODUODENOSCOPY (EGD) WITH PROPOFOL N/A 04/04/2015   Procedure: ESOPHAGOGASTRODUODENOSCOPY (EGD) WITH PROPOFOL;  Surgeon: Lucilla Lame, MD;  Location: [facility] ENDOSCOPY;  Service: Endoscopy;  Laterality: N/A;  Dr Allen Norris prefers [phone]  . ESOPHAGOGASTRODUODENOSCOPY W/ BANDING  09/2014   [place]   . TUBAL LIGATION      Prior to Admission medications   Medication Sig Start Date End Date Taking? Authorizing Provider  brompheniramine-pseudoephedrine-DM 30-2-10 MG/5ML syrup Take 5 mLs by mouth 4 (four) times daily as needed. 01/01/18   Sable Feil, PA-C  folic acid (FOLVITE) 1 MG tablet Take 1 tablet (1 mg total) by mouth daily. 06/15/16   Fritzi Mandes, MD  levothyroxine (SYNTHROID, LEVOTHROID) 175 MCG tablet Take 175 mcg by mouth daily before breakfast.     [provider]  Multiple Vitamin (MULTIVITAMIN WITH MINERALS) TABS tablet Take 1 tablet by mouth daily. 06/15/16   Fritzi Mandes, MD  nadolol (CORGARD) 20 MG tablet Take 1 tablet (20 mg total) by mouth daily. 06/15/16   Fritzi Mandes, MD  pantoprazole (PROTONIX) 40 MG tablet Take 1 tablet (40 mg total) by mouth 2 (two) times daily before a meal. Patient taking differently: Take 40 mg by mouth daily.  04/06/15   Vaughan Basta, MD  spironolactone (ALDACTONE) 25 MG tablet Take 1 tablet (25 mg total) by mouth daily. 04/06/15   Vaughan Basta, MD  thiamine 50 MG tablet Take 1 tablet (50 mg total) by mouth daily. 06/15/16   Fritzi Mandes, MD    Allergies Hydrocodone-acetaminophen, Ibuprofen, Naproxen, Propoxyphene, Shellfish allergy, and Tamiflu  [oseltamivir phosphate]  Family History  Problem Relation Age of Onset  . Aneurysm Father   . COPD Father   . Heart attack Mother     Social History Social  History   Tobacco Use  . Smoking status: Never Smoker  Substance Use Topics  . Alcohol use: Yes    Alcohol/week: 15.0 standard drinks    Types: 15 Shots of liquor per week    Comment: Quit ETOH- 04/04/15, Heavy Use prior to this  . Drug use: No     Review of Systems  Cardiovascular: No chest pain. Respiratory: No SOB. Gastrointestinal: No abdominal pain.  No nausea, no vomiting.  Musculoskeletal: Positive for low back pain. Skin: Negative for rash, abrasions, lacerations, ecchymosis. Neurological: Negative  for headaches   ____________________________________________   PHYSICAL EXAM:  VITAL SIGNS: ED Triage Vitals  Enc Vitals Group     BP 10/26/19 2034 139/71     Pulse Rate 10/26/19 2034 (!) 56     Resp 10/26/19 2034 20     Temp --      Temp src --      SpO2 10/26/19 2034 99 %     Weight 10/26/19 2036 180 lb (81.6 kg)     Height 10/26/19 2036 5\' 4"  (1.626 m)     Head Circumference --      Peak Flow --      Pain Score 10/26/19 2035 7     Pain Loc --      Pain Edu? --      Excl. in GC? --      Constitutional: Alert and oriented. Well appearing and in no acute distress. Eyes: Conjunctivae are normal. PERRL. EOMI. Head: Atraumatic. ENT:      Ears:      Nose: No congestion/rhinnorhea.      Mouth/Throat: Mucous membranes are moist.  Neck: No stridor. No cervical spine tenderness to palpation. Cardiovascular: Normal rate, regular rhythm.  Good peripheral circulation. Respiratory: Normal respiratory effort without tachypnea or retractions. Lungs CTAB. Good air entry to the bases with no decreased or absent breath sounds. Gastrointestinal: Bowel sounds 4 quadrants. Soft and nontender to palpation. No guarding or rigidity. No palpable masses. No distention. Musculoskeletal: Full range of motion to all extremities. No gross deformities appreciated.  Tenderness to palpation to left lumbar paraspinal muscles.  No tenderness to palpation over lumbar spine. Neurologic:  Normal speech and language. No gross focal neurologic deficits are appreciated.  Skin:  Skin is warm, dry and intact. No rash noted. Psychiatric: Mood and affect are normal. Speech and behavior are normal. Patient exhibits appropriate insight and judgement.   ____________________________________________   LABS (all labs ordered are listed, but only abnormal results are displayed)  Labs Reviewed - No data to  display ____________________________________________  EKG  SB ____________________________________________  RADIOLOGY I, 2036, personally viewed and evaluated these images (plain radiographs) as part of my medical decision making, as well as reviewing the written report by the radiologist.  Dg Lumbar Spine 2-3 Views  Result Date: 10/26/2019 CLINICAL DATA:  Fall, low back pain and left hip pain. EXAM: LUMBAR SPINE - 2-3 VIEW COMPARISON:  None. FINDINGS: Coarsened trabecula throughout the L5 vertebral body, likely hemangioma. Degenerative facet disease throughout the lumbar spine. Disc spaces are maintained. Normal alignment. No fracture. Aortoiliac atherosclerosis. No aneurysm. IMPRESSION: No acute bony abnormality. Degenerative facet disease diffusely throughout the lumbar spine. Aortoiliac atherosclerosis. Electronically Signed   By: 14/01/2019 M.D.   On: 10/26/2019 21:07   Ct Head Wo Contrast  Result Date: 10/26/2019 CLINICAL DATA:  Head trauma, fall EXAM: CT HEAD WITHOUT CONTRAST TECHNIQUE: Contiguous axial images were obtained from the base of the skull through  the vertex without intravenous contrast. COMPARISON:  None. FINDINGS: Brain: No acute intracranial abnormality. Specifically, no hemorrhage, hydrocephalus, mass lesion, acute infarction, or significant intracranial injury. Vascular: No hyperdense vessel or unexpected calcification. Skull: No acute calvarial abnormality. Sinuses/Orbits: Visualized paranasal sinuses and mastoids clear. Orbital soft tissues unremarkable. Other: None IMPRESSION: No acute intracranial abnormality. Electronically Signed   By: Charlett Nose M.D.   On: 10/26/2019 23:09   Ct Cervical Spine Wo Contrast  Result Date: 10/26/2019 CLINICAL DATA:  Fall EXAM: CT CERVICAL SPINE WITHOUT CONTRAST TECHNIQUE: Multidetector CT imaging of the cervical spine was performed without intravenous contrast. Multiplanar CT image reconstructions were also generated.  COMPARISON:  None. FINDINGS: Alignment: No subluxation Skull base and vertebrae: No acute fracture. No primary bone lesion or focal pathologic process. Soft tissues and spinal canal: No prevertebral fluid or swelling. No visible canal hematoma. Disc levels: Degenerative disc disease, most pronounced at C3-4 and C4-5. Upper chest: No acute findings Other: None IMPRESSION: Cervical spondylosis.  No acute bony abnormality. Electronically Signed   By: Charlett Nose M.D.   On: 10/26/2019 23:10   Dg Hip Unilat W Or Wo Pelvis 2-3 Views Left  Result Date: 10/26/2019 CLINICAL DATA:  , Low back and left hip pain. EXAM: DG HIP (WITH OR WITHOUT PELVIS) 2-3V LEFT COMPARISON:  None. FINDINGS: Hip joints are symmetric. Early spurring bilaterally. SI joints symmetric and unremarkable. No acute bony abnormality. Specifically, no fracture, subluxation, or dislocation. IMPRESSION: No acute bony abnormality. Electronically Signed   By: Charlett Nose M.D.   On: 10/26/2019 21:06    ____________________________________________    PROCEDURES  Procedure(s) performed:    Procedures    Medications - No data to display   ____________________________________________   INITIAL IMPRESSION / ASSESSMENT AND PLAN / ED COURSE  Pertinent labs & imaging results that were available during my care of the patient were reviewed by me and considered in my medical decision making (see chart for details).  Review of the [place] CSRS was performed in accordance of the [facility] prior to dispensing any controlled drugs.   Patient presented to the emergency department for evaluation after fall.  Vital signs and exam are reassuring.  CT head is negative for acute intracranial abnormalities.  CT cervical and lumbar x-ray and hip x-ray are negative for acute bony abnormalities.  Patient declines x-ray.  Patient will be discharged home with prescriptions for Lidoderm patch. Patient is to follow up with primary care as directed. Patient is given  ED precautions to return to the ED for any worsening or new symptoms.   Kathryn [name] was evaluated in Emergency Department on 10/27/2019 for the symptoms described in the history of present illness. She was evaluated in the context of the global COVID-19 pandemic, which necessitated consideration that the patient might be at risk for infection with the SARS-CoV-2 virus that causes COVID-19. Institutional protocols and algorithms that pertain to the evaluation of patients at risk for COVID-19 are in a state of rapid change based on information released by regulatory bodies including the CDC and federal and state organizations. These policies and algorithms were followed during the patient's care in the ED.  ____________________________________________  FINAL CLINICAL IMPRESSION(S) / ED DIAGNOSES  Final diagnoses:  Acute lumbar back pain      NEW MEDICATIONS STARTED DURING THIS VISIT:  ED Discharge Orders    None          This chart was dictated using voice recognition software/Dragon. Despite best efforts to proofread,  errors can occur which can change the meaning. Any change was purely unintentional.    Enid DerryWagner, Dolton Shaker, PA-C 10/27/19 0049    Dionne BucySiadecki, Sebastian, MD 10/29/19 [phone]

## 2019-10-27 MED ORDER — LIDOCAINE 5 % EX PTCH: 1.0000 | MEDICATED_PATCH | CUTANEOUS | 0 refills | Status: DC

## 2019-10-27 MED ORDER — LIDOCAINE 5 % EX PTCH
1.0000 | MEDICATED_PATCH | CUTANEOUS | Status: DC
  Administered 2019-10-27: 1 via TRANSDERMAL
  Filled 2019-10-27: qty 1

## 2019-10-27 NOTE — Discharge Instructions (Signed)
The CT of your head was normal.  The x-ray is of your back and hip and the CT of your neck show that you do have arthritis.  Please use ice to your back today.  You can use Lidoderm patch for your back pain.  Please call primary care tomorrow for a follow-up appointment.

## 2019-10-27 NOTE — ED Notes (Signed)
Called [phone] (daughter) for ride. States will try to find ride for pt. Will follow-up.

## 2019-11-20 ENCOUNTER — Encounter: Payer: Self-pay | Admitting: Emergency Medicine

## 2019-11-20 ENCOUNTER — Other Ambulatory Visit: Payer: Self-pay

## 2019-11-20 ENCOUNTER — Inpatient Hospital Stay
Admission: EM | Admit: 2019-11-20 | Discharge: 2019-11-23 | DRG: 177 | Disposition: A | Discharge status: Home / Self Care | Payer: Medicare Other | Attending: Internal Medicine | Admitting: Internal Medicine

## 2019-11-20 ENCOUNTER — Emergency Department: Payer: Medicare Other

## 2019-11-20 DIAGNOSIS — I10 Essential (primary) hypertension: Secondary | ICD-10-CM

## 2019-11-20 DIAGNOSIS — E876 Hypokalemia: Secondary | ICD-10-CM | POA: Diagnosis present

## 2019-11-20 DIAGNOSIS — J1289 Other viral pneumonia: Secondary | ICD-10-CM | POA: Diagnosis present

## 2019-11-20 DIAGNOSIS — J1282 Pneumonia due to coronavirus disease 2019: Secondary | ICD-10-CM | POA: Diagnosis present

## 2019-11-20 DIAGNOSIS — Z825 Family history of asthma and other chronic lower respiratory diseases: Secondary | ICD-10-CM | POA: Diagnosis not present

## 2019-11-20 DIAGNOSIS — J45909 Unspecified asthma, uncomplicated: Secondary | ICD-10-CM | POA: Diagnosis present

## 2019-11-20 DIAGNOSIS — I509 Heart failure, unspecified: Secondary | ICD-10-CM | POA: Diagnosis present

## 2019-11-20 DIAGNOSIS — U071 COVID-19: Principal | ICD-10-CM | POA: Diagnosis present

## 2019-11-20 DIAGNOSIS — I110 Hypertensive heart disease with heart failure: Secondary | ICD-10-CM | POA: Diagnosis present

## 2019-11-20 DIAGNOSIS — Z888 Allergy status to other drugs, medicaments and biological substances status: Secondary | ICD-10-CM | POA: Diagnosis not present

## 2019-11-20 DIAGNOSIS — Z8249 Family history of ischemic heart disease and other diseases of the circulatory system: Secondary | ICD-10-CM

## 2019-11-20 DIAGNOSIS — F1010 Alcohol abuse, uncomplicated: Secondary | ICD-10-CM | POA: Diagnosis present

## 2019-11-20 DIAGNOSIS — Z79890 Hormone replacement therapy (postmenopausal): Secondary | ICD-10-CM | POA: Diagnosis not present

## 2019-11-20 DIAGNOSIS — Z91013 Allergy to seafood: Secondary | ICD-10-CM | POA: Diagnosis not present

## 2019-11-20 DIAGNOSIS — Z885 Allergy status to narcotic agent status: Secondary | ICD-10-CM | POA: Diagnosis not present

## 2019-11-20 DIAGNOSIS — Z79899 Other long term (current) drug therapy: Secondary | ICD-10-CM | POA: Diagnosis not present

## 2019-11-20 DIAGNOSIS — D869 Sarcoidosis, unspecified: Secondary | ICD-10-CM | POA: Diagnosis present

## 2019-11-20 DIAGNOSIS — K7030 Alcoholic cirrhosis of liver without ascites: Secondary | ICD-10-CM | POA: Diagnosis present

## 2019-11-20 DIAGNOSIS — J189 Pneumonia, unspecified organism: Secondary | ICD-10-CM | POA: Diagnosis present

## 2019-11-20 DIAGNOSIS — K766 Portal hypertension: Secondary | ICD-10-CM | POA: Diagnosis present

## 2019-11-20 DIAGNOSIS — E039 Hypothyroidism, unspecified: Secondary | ICD-10-CM | POA: Diagnosis present

## 2019-11-20 LAB — CBC WITH DIFFERENTIAL/PLATELET
Abs Immature Granulocytes: 0.03 10*3/uL (ref 0.00–0.07)
Basophils Absolute: 0 10*3/uL (ref 0.0–0.1)
Basophils Relative: 1 %
Eosinophils Absolute: 0 10*3/uL (ref 0.0–0.5)
Eosinophils Relative: 0 %
HCT: 33.1 % — ABNORMAL LOW (ref 36.0–46.0)
Hemoglobin: 10.6 g/dL — ABNORMAL LOW (ref 12.0–15.0)
Immature Granulocytes: 1 %
Lymphocytes Relative: 13 %
Lymphs Abs: 0.3 10*3/uL — ABNORMAL LOW (ref 0.7–4.0)
MCH: 30.3 pg (ref 26.0–34.0)
MCHC: 32 g/dL (ref 30.0–36.0)
MCV: 94.6 fL (ref 80.0–100.0)
Monocytes Absolute: 0.4 10*3/uL (ref 0.1–1.0)
Monocytes Relative: 16 %
Neutro Abs: 1.7 10*3/uL (ref 1.7–7.7)
Neutrophils Relative %: 69 %
Platelets: 136 10*3/uL — ABNORMAL LOW (ref 150–400)
RBC: 3.5 MIL/uL — ABNORMAL LOW (ref 3.87–5.11)
RDW: 17 % — ABNORMAL HIGH (ref 11.5–15.5)
WBC: 2.5 10*3/uL — ABNORMAL LOW (ref 4.0–10.5)
nRBC: 0 % (ref 0.0–0.2)

## 2019-11-20 LAB — BASIC METABOLIC PANEL
Anion gap: 13 (ref 5–15)
BUN: 12 mg/dL (ref 8–23)
CO2: 22 mmol/L (ref 22–32)
Calcium: 8.3 mg/dL — ABNORMAL LOW (ref 8.9–10.3)
Chloride: 100 mmol/L (ref 98–111)
Creatinine, Ser: 1.25 mg/dL — ABNORMAL HIGH (ref 0.44–1.00)
GFR calc Af Amer: 53 mL/min — ABNORMAL LOW (ref >60)
GFR calc non Af Amer: 46 mL/min — ABNORMAL LOW (ref >60)
Glucose, Bld: 89 mg/dL (ref 70–99)
Potassium: 3.3 mmol/L — ABNORMAL LOW (ref 3.5–5.1)
Sodium: 135 mmol/L (ref 135–145)

## 2019-11-20 LAB — RESPIRATORY PANEL BY PCR

## 2019-11-20 LAB — TSH: TSH: 31.669 u[IU]/mL — ABNORMAL HIGH (ref 0.350–4.500)

## 2019-11-20 LAB — RESPIRATORY PANEL BY RT PCR (FLU A&B, COVID)
Influenza A by PCR: NEGATIVE
Influenza B by PCR: NEGATIVE
SARS Coronavirus 2 by RT PCR: POSITIVE — AB

## 2019-11-20 LAB — FIBRINOGEN: Fibrinogen: 511 mg/dL — ABNORMAL HIGH (ref 210–475)

## 2019-11-20 LAB — LACTATE DEHYDROGENASE: LDH: 231 U/L — ABNORMAL HIGH (ref 98–192)

## 2019-11-20 LAB — FIBRIN DERIVATIVES D-DIMER (ARMC ONLY): Fibrin derivatives D-dimer (ARMC): 1473.23 ng/mL (FEU) — ABNORMAL HIGH (ref 0.00–499.00)

## 2019-11-20 LAB — LACTIC ACID, PLASMA: Lactic Acid, Venous: 1.8 mmol/L (ref 0.5–1.9)

## 2019-11-20 LAB — PROCALCITONIN: Procalcitonin: <0.1 ng/mL

## 2019-11-20 LAB — FERRITIN: Ferritin: 47 ng/mL (ref 11–307)

## 2019-11-20 LAB — MAGNESIUM: Magnesium: 2 mg/dL (ref 1.7–2.4)

## 2019-11-20 LAB — POC SARS CORONAVIRUS 2 AG: SARS Coronavirus 2 Ag: NEGATIVE

## 2019-11-20 MED ORDER — ZINC SULFATE 220 (50 ZN) MG PO CAPS
220.0000 mg | ORAL_CAPSULE | Freq: Every day | ORAL | Status: DC
  Administered 2019-11-20 – 2019-11-23 (×4): 220 mg via ORAL
  Filled 2019-11-20 (×4): qty 1

## 2019-11-20 MED ORDER — ENOXAPARIN SODIUM 40 MG/0.4ML ~~LOC~~ SOLN
40.0000 mg | SUBCUTANEOUS | Status: DC
  Administered 2019-11-20 – 2019-11-22 (×3): 40 mg via SUBCUTANEOUS
  Filled 2019-11-20 (×3): qty 0.4

## 2019-11-20 MED ORDER — SODIUM CHLORIDE 0.9 % IV SOLN
100.0000 mg | Freq: Two times a day (BID) | INTRAVENOUS | Status: DC
  Administered 2019-11-21 – 2019-11-22 (×3): 100 mg via INTRAVENOUS
  Filled 2019-11-20 (×4): qty 100

## 2019-11-20 MED ORDER — ADULT MULTIVITAMIN W/MINERALS CH
1.0000 | ORAL_TABLET | Freq: Every day | ORAL | Status: DC
  Administered 2019-11-20 – 2019-11-23 (×4): 1 via ORAL
  Filled 2019-11-20 (×4): qty 1

## 2019-11-20 MED ORDER — THIAMINE HCL 100 MG PO TABS
50.0000 mg | ORAL_TABLET | Freq: Every day | ORAL | Status: DC
  Administered 2019-11-20 – 2019-11-23 (×4): 50 mg via ORAL
  Filled 2019-11-20 (×4): qty 1

## 2019-11-20 MED ORDER — GUAIFENESIN-DM 100-10 MG/5ML PO SYRP
10.0000 mL | ORAL_SOLUTION | ORAL | Status: DC | PRN
  Administered 2019-11-20 – 2019-11-23 (×5): 10 mL via ORAL
  Filled 2019-11-20 (×6): qty 10

## 2019-11-20 MED ORDER — LEVOTHYROXINE SODIUM 50 MCG PO TABS
175.0000 ug | ORAL_TABLET | Freq: Every day | ORAL | Status: DC
  Administered 2019-11-21 – 2019-11-23 (×3): 175 ug via ORAL
  Filled 2019-11-20 (×4): qty 1

## 2019-11-20 MED ORDER — INFLUENZA VAC SPLIT QUAD 0.5 ML IM SUSY: 0.5000 mL | PREFILLED_SYRINGE | INTRAMUSCULAR | Status: DC

## 2019-11-20 MED ORDER — DEXAMETHASONE SODIUM PHOSPHATE 10 MG/ML IJ SOLN
6.0000 mg | INTRAMUSCULAR | Status: DC
  Administered 2019-11-20 – 2019-11-22 (×3): 6 mg via INTRAVENOUS
  Filled 2019-11-20: qty 0.6
  Filled 2019-11-20: qty 1
  Filled 2019-11-20 (×2): qty 0.6

## 2019-11-20 MED ORDER — SODIUM CHLORIDE 0.9 % IV SOLN: 500.0000 mg | INTRAVENOUS | Status: DC

## 2019-11-20 MED ORDER — SPIRONOLACTONE 25 MG PO TABS
25.0000 mg | ORAL_TABLET | Freq: Every day | ORAL | Status: DC
  Administered 2019-11-21 – 2019-11-23 (×3): 25 mg via ORAL
  Filled 2019-11-20 (×4): qty 1

## 2019-11-20 MED ORDER — PANTOPRAZOLE SODIUM 40 MG PO TBEC
40.0000 mg | DELAYED_RELEASE_TABLET | Freq: Two times a day (BID) | ORAL | Status: DC
  Administered 2019-11-20 – 2019-11-23 (×6): 40 mg via ORAL
  Filled 2019-11-20 (×6): qty 1

## 2019-11-20 MED ORDER — CEFTRIAXONE SODIUM 2 G IJ SOLR
2.0000 g | Freq: Once | INTRAMUSCULAR | Status: AC
  Administered 2019-11-20: 2 g via INTRAVENOUS
  Filled 2019-11-20 (×2): qty 20

## 2019-11-20 MED ORDER — ASCORBIC ACID 500 MG PO TABS
500.0000 mg | ORAL_TABLET | Freq: Every day | ORAL | Status: DC
  Administered 2019-11-20 – 2019-11-23 (×4): 500 mg via ORAL
  Filled 2019-11-20 (×4): qty 1

## 2019-11-20 MED ORDER — FOLIC ACID 1 MG PO TABS
1.0000 mg | ORAL_TABLET | Freq: Every day | ORAL | Status: DC
  Administered 2019-11-20 – 2019-11-23 (×4): 1 mg via ORAL
  Filled 2019-11-20 (×4): qty 1

## 2019-11-20 MED ORDER — ONDANSETRON HCL 4 MG/2ML IJ SOLN
4.0000 mg | Freq: Once | INTRAMUSCULAR | Status: AC
  Administered 2019-11-20: 4 mg via INTRAVENOUS
  Filled 2019-11-20: qty 2

## 2019-11-20 MED ORDER — POTASSIUM CHLORIDE CRYS ER 20 MEQ PO TBCR
40.0000 meq | EXTENDED_RELEASE_TABLET | Freq: Once | ORAL | Status: AC
  Administered 2019-11-20: 40 meq via ORAL
  Filled 2019-11-20: qty 2

## 2019-11-20 MED ORDER — SODIUM CHLORIDE 0.9 % IV BOLUS
1000.0000 mL | Freq: Once | INTRAVENOUS | Status: AC
  Administered 2019-11-20: 1000 mL via INTRAVENOUS

## 2019-11-20 MED ORDER — SODIUM CHLORIDE 0.9 % IV SOLN
500.0000 mg | Freq: Once | INTRAVENOUS | Status: AC
  Administered 2019-11-20: 500 mg via INTRAVENOUS
  Filled 2019-11-20: qty 500

## 2019-11-20 MED ORDER — SODIUM CHLORIDE 0.9 % IV SOLN
2.0000 g | INTRAVENOUS | Status: DC
  Administered 2019-11-21 – 2019-11-22 (×2): 2 g via INTRAVENOUS
  Filled 2019-11-20: qty 2
  Filled 2019-11-20: qty 20
  Filled 2019-11-20: qty 2

## 2019-11-20 MED ORDER — SODIUM CHLORIDE 0.9 % IV SOLN: INTRAVENOUS | Status: DC

## 2019-11-20 MED ORDER — NADOLOL 20 MG PO TABS
20.0000 mg | ORAL_TABLET | Freq: Every day | ORAL | Status: DC
  Administered 2019-11-20 – 2019-11-23 (×4): 20 mg via ORAL
  Filled 2019-11-20 (×4): qty 1

## 2019-11-20 NOTE — ED Notes (Signed)
Pt unable to eat cracker without increased nausea. MD aware.

## 2019-11-20 NOTE — ED Triage Notes (Signed)
Pt in via EMS from home with c/o for 1 week. EMS reports pt called her MD this am and was told to come to the ED.

## 2019-11-20 NOTE — ED Provider Notes (Signed)
[facility] Emergency Department Provider Note  ____________________________________________   First MD Initiated Contact with Patient 11/20/19 1405     (approximate)  I have reviewed the triage vital signs and the nursing notes.   HISTORY  Chief Complaint Cough    HPI Kathryn [name] is a 63 y.o. female  With h/o cirrhosis, portal HTN, sarcoidosis here with L lingular PNA. Pt states that for the past week she's had worsening dry cough. She has been "hacking" non-stop and has now had associated nausea, vomiting w/ loss of appetite. She has had subjective fevers, chills. She now feels SOB even at rest. Denies known COVID exposures. Reports h/o asthma but does not use inhalers. No leg swelling. No specific alleviating or aggravating factors.        Past Medical History:  Diagnosis Date  . Alcoholic cirrhosis of liver ([place])   . Asthma   . CHF (congestive heart failure) ([place])   . Chronic disease anemia   . Esophageal varices ([place])    GR I on EGD by Dr Rayann Heman 09/2014  . ETOH abuse   . Hypertension   . Hypothyroidism   . Murmur   . Portal hypertension ([place])   . Sarcoidosis     Patient Active Problem List   Diagnosis Date Noted  . Anemia 06/12/2016  . Hypoglycemia 02/11/2016  . Hematemesis 04/04/2015  . Cirrhosis ([place]) 04/04/2015  . Esophageal varices ([place]) 04/04/2015  . Hypothyroid 04/04/2015  . Hypertension 04/04/2015  . Chest pain 04/04/2015    Past Surgical History:  Procedure Laterality Date  . ESOPHAGOGASTRODUODENOSCOPY (EGD) WITH PROPOFOL N/A 04/04/2015   Procedure: ESOPHAGOGASTRODUODENOSCOPY (EGD) WITH PROPOFOL;  Surgeon: Lucilla Lame, MD;  Location: [facility] ENDOSCOPY;  Service: Endoscopy;  Laterality: N/A;  Dr Allen Norris prefers [phone]  . ESOPHAGOGASTRODUODENOSCOPY W/ BANDING  09/2014   Rein  . TUBAL LIGATION      Prior to Admission medications   Medication Sig Start Date End Date Taking? Authorizing Provider  brompheniramine-pseudoephedrine-DM  30-2-10 MG/5ML syrup Take 5 mLs by mouth 4 (four) times daily as needed. 01/01/18   Sable Feil, PA-C  folic acid (FOLVITE) 1 MG tablet Take 1 tablet (1 mg total) by mouth daily. 06/15/16   Fritzi Mandes, MD  levothyroxine (SYNTHROID, LEVOTHROID) 175 MCG tablet Take 175 mcg by mouth daily before breakfast.     [provider]  lidocaine (LIDODERM) 5 % Place 1 patch onto the skin daily. Remove & Discard patch within 12 hours or as directed by MD 10/27/19   Laban Emperor, PA-C  Multiple Vitamin (MULTIVITAMIN WITH MINERALS) TABS tablet Take 1 tablet by mouth daily. 06/15/16   Fritzi Mandes, MD  nadolol (CORGARD) 20 MG tablet Take 1 tablet (20 mg total) by mouth daily. 06/15/16   Fritzi Mandes, MD  pantoprazole (PROTONIX) 40 MG tablet Take 1 tablet (40 mg total) by mouth 2 (two) times daily before a meal. Patient taking differently: Take 40 mg by mouth daily.  04/06/15   Vaughan Basta, MD  spironolactone (ALDACTONE) 25 MG tablet Take 1 tablet (25 mg total) by mouth daily. 04/06/15   Vaughan Basta, MD  thiamine 50 MG tablet Take 1 tablet (50 mg total) by mouth daily. 06/15/16   Fritzi Mandes, MD    Allergies Hydrocodone-acetaminophen, Ibuprofen, Naproxen, Propoxyphene, Shellfish allergy, and Tamiflu  [oseltamivir phosphate]  Family History  Problem Relation Age of Onset  . Aneurysm Father   . COPD Father   . Heart attack Mother  Social History Social History   Tobacco Use  . Smoking status: Never Smoker  . Smokeless tobacco: Never Used  Substance Use Topics  . Alcohol use: Yes    Alcohol/week: 15.0 standard drinks    Types: 15 Shots of liquor per week    Comment: Quit ETOH- 04/04/15, Heavy Use prior to this  . Drug use: No    Review of Systems  Review of Systems  Constitutional: Positive for chills and fatigue. Negative for fever.  HENT: Negative for congestion and sore throat.   Eyes: Negative for visual disturbance.  Respiratory: Positive for cough and shortness  of breath.   Cardiovascular: Negative for chest pain.  Gastrointestinal: Negative for abdominal pain, diarrhea, nausea and vomiting.  Genitourinary: Negative for flank pain.  Musculoskeletal: Negative for back pain and neck pain.  Skin: Negative for rash and wound.  Neurological: Positive for weakness.  All other systems reviewed and are negative.    ____________________________________________  PHYSICAL EXAM:      VITAL SIGNS: ED Triage Vitals  Enc Vitals Group     BP 11/20/19 1100 (!) 144/84     Pulse Rate 11/20/19 1100 86     Resp 11/20/19 1100 18     Temp 11/20/19 1100 98.7 F (37.1 C)     Temp Source 11/20/19 1100 Oral     SpO2 11/20/19 1100 96 %     Weight 11/20/19 1100 185 lb (83.9 kg)     Height 11/20/19 1100 5\' 4"  (1.626 m)     Head Circumference --      Peak Flow --      Pain Score 11/20/19 1106 6     Pain Loc --      Pain Edu? --      Excl. in GC? --      Physical Exam Vitals and nursing note reviewed.  Constitutional:      General: She is not in acute distress.    Appearance: She is well-developed.  HENT:     Head: Normocephalic and atraumatic.  Eyes:     Conjunctiva/sclera: Conjunctivae normal.  Cardiovascular:     Rate and Rhythm: Normal rate and regular rhythm.     Heart sounds: Normal heart sounds. No murmur. No friction rub.  Pulmonary:     Effort: Pulmonary effort is normal. Tachypnea present. No respiratory distress.     Breath sounds: Examination of the left-middle field reveals rales. Examination of the left-lower field reveals rales. Rhonchi and rales present. No wheezing.  Abdominal:     General: There is no distension.     Palpations: Abdomen is soft.     Tenderness: There is no abdominal tenderness.  Musculoskeletal:     Cervical back: Neck supple.  Skin:    General: Skin is warm.     Capillary Refill: Capillary refill takes less than 2 seconds.  Neurological:     Mental Status: She is alert and oriented to person, place, and time.       Motor: No abnormal muscle tone.       ____________________________________________   LABS (all labs ordered are listed, but only abnormal results are displayed)  Labs Reviewed  CBC WITH DIFFERENTIAL/PLATELET - Abnormal; Notable for the following components:      Result Value   WBC 2.5 (*)    RBC 3.50 (*)    Hemoglobin 10.6 (*)    HCT 33.1 (*)    RDW 17.0 (*)    Platelets 136 (*)    Lymphs  Abs 0.3 (*)    All other components within normal limits  BASIC METABOLIC PANEL - Abnormal; Notable for the following components:   Potassium 3.3 (*)    Creatinine, Ser 1.25 (*)    Calcium 8.3 (*)    GFR calc non Af Amer 46 (*)    GFR calc Af Amer 53 (*)    All other components within normal limits  CULTURE, BLOOD (ROUTINE X 2)  CULTURE, BLOOD (ROUTINE X 2)  RESPIRATORY PANEL BY RT PCR (FLU A&B, COVID)  LACTIC ACID, PLASMA  LACTIC ACID, PLASMA  POC SARS CORONAVIRUS 2 AG -  ED  POC SARS CORONAVIRUS 2 AG    ____________________________________________  EKG: Normal sinus rhythm, VR 76. PR 140, QRS 74, QTc 504.  TWI noted diffusely. No overt ST elevations. ________________________________________  RADIOLOGY All imaging, including plain films, CT scans, and ultrasounds, independently reviewed by me, and interpretations confirmed via formal radiology reads.  ED MD interpretation:   CXR: LLL PNA  Official radiology report(s): DG Chest 2 View  Result Date: 11/20/2019 CLINICAL DATA:  Dry cough EXAM: CHEST - 2 VIEW COMPARISON:  06/12/2016 FINDINGS: Left lower lobe airspace disease along the major fissure. No effusion or pneumothorax. Normal heart size. IMPRESSION: Left lower lobe pneumonia. Electronically Signed   By: Marnee Spring M.D.   On: 11/20/2019 11:22    ____________________________________________  PROCEDURES   Procedure(s) performed (including Critical Care):  Procedures  ____________________________________________  INITIAL IMPRESSION / MDM / ASSESSMENT AND  PLAN / ED COURSE  As part of my medical decision making, I reviewed the following data within the electronic MEDICAL RECORD NUMBER Nursing notes reviewed and incorporated, Old chart reviewed, Notes from prior ED visits, and [place] Controlled Substance Database       *Kathryn [name] was evaluated in Emergency Department on 11/20/2019 for the symptoms described in the history of present illness. She was evaluated in the context of the global COVID-19 pandemic, which necessitated consideration that the patient might be at risk for infection with the SARS-CoV-2 virus that causes COVID-19. Institutional protocols and algorithms that pertain to the evaluation of patients at risk for COVID-19 are in a state of rapid change based on information released by regulatory bodies including the CDC and federal and state organizations. These policies and algorithms were followed during the patient's care in the ED.  Some ED evaluations and interventions may be delayed as a result of limited staffing during the pandemic.*  Clinical Course as of Nov 19 1630  Mon Nov 20, 2019  1444 63 yo F here with cough, SOB. Satting well on RA but with mild tachypnea on exam. Imaging shows left-sided PNA. Will check COVID, labs given comorbidities, and start IVF w/ ABX. Cultures sent.   [CI]  1617 Leukopenia is @ baseline. BMP at baseline with baseline mild CKD. LA normal. VS remain stable. Pt started on Rocephin/Flagyl. COVID neg. Will PO challenge, ambulate, re-assess.   [CI]  1619 Pneumonia severity index 93, risk class IV. Persistent vomiting/nausea after eating crackers. Will admit.   [CI]    Clinical Course User Index [CI] Shaune Pollack, MD    Medical Decision Making:  As above.  ____________________________________________  FINAL CLINICAL IMPRESSION(S) / ED DIAGNOSES  Final diagnoses:  Community acquired pneumonia of left lower lobe of lung     MEDICATIONS GIVEN DURING THIS VISIT:  Medications  azithromycin  (ZITHROMAX) 500 mg in sodium chloride 0.9 % 250 mL IVPB (500 mg Intravenous New Bag/Given 11/20/19 1608)  cefTRIAXone (ROCEPHIN) 2 g in sodium chloride 0.9 % 100 mL IVPB ( Intravenous Stopped 11/20/19 1601)  sodium chloride 0.9 % bolus 1,000 mL (1,000 mLs Intravenous New Bag/Given 11/20/19 1606)  ondansetron (ZOFRAN) injection 4 mg (4 mg Intravenous Given 11/20/19 1605)     ED Discharge Orders    None       Note:  This document was prepared using Dragon voice recognition software and may include unintentional dictation errors.   Shaune Pollack, MD 11/20/19 [phone]

## 2019-11-20 NOTE — ED Notes (Signed)
See triage note  Presents with cough for about 1 week  States cough is mainly non prod  And is having some discomfort in chest with cough and inspiration

## 2019-11-20 NOTE — ED Notes (Signed)
Admitting MD at bedside.

## 2019-11-20 NOTE — Progress Notes (Addendum)
Patient's rapid COVID was neg per EDP.  Nurse just reports COVID + on regular lab. Will check procalcitonin. Will hold off remdesevir for now as she is on RA.   Labs show - TSH 31.66. Per pharmacist she hasn't been taking her home meds for a while. Continue current synthroid dose as is for now. Will need recheck in few weeks.  Start decadron, vit c & zinc for now.  Check inflammatory markers and monitor

## 2019-11-20 NOTE — ED Triage Notes (Signed)
States cough for about 1 week  States her cough is "hard enough to make her vomit"

## 2019-11-20 NOTE — H&P (Signed)
[facility] NAME: Kathryn [name]    MR#:  [id number]  DATE OF BIRTH:  [birth year]  DATE OF ADMISSION:  11/20/2019  PRIMARY CARE PHYSICIAN: Kathryn Mires Elyse Jarvis, MD   REQUESTING/REFERRING PHYSICIAN: Duffy Bruce, MD  CHIEF COMPLAINT:   Chief Complaint  Patient presents with  . Cough    HISTORY OF PRESENT ILLNESS:  Kathryn [name]  is a 63 y.o. female with a known history of cirrhosis, portal HTN, sarcoidosis here with L lingular PNA. Pt states that for the past week she's had worsening dry cough. She has been "hacking" non-stop and has now had associated nausea, vomiting w/ loss of appetite. She has had subjective fevers, chills. She now feels SOB even at rest. Denies known COVID exposures. Reports h/o asthma but does not use inhalers.  Chest x-ray in the emergency department is worrisome for left lower lobe pneumonia.  She is being admitted for further evaluation and management. PAST MEDICAL HISTORY:   Past Medical History:  Diagnosis Date  . Alcoholic cirrhosis of liver ([place])   . Asthma   . CHF (congestive heart failure) ([place])   . Chronic disease anemia   . Esophageal varices ([place])    GR I on EGD by Dr Kathryn [name] 09/2014  . ETOH abuse   . Hypertension   . Hypothyroidism   . Murmur   . Portal hypertension ([place])   . Sarcoidosis     PAST SURGICAL HISTORY:   Past Surgical History:  Procedure Laterality Date  . ESOPHAGOGASTRODUODENOSCOPY (EGD) WITH PROPOFOL N/A 04/04/2015   Procedure: ESOPHAGOGASTRODUODENOSCOPY (EGD) WITH PROPOFOL;  Surgeon: Kathryn Lame, MD;  Location: [facility] ENDOSCOPY;  Service: Endoscopy;  Laterality: N/A;  Dr Kathryn [name] prefers [phone]  . ESOPHAGOGASTRODUODENOSCOPY W/ BANDING  09/2014   Rein  . TUBAL LIGATION      SOCIAL HISTORY:   Social History   Tobacco Use  . Smoking status: Never Smoker  . Smokeless tobacco: Never Used  Substance Use Topics  . Alcohol use: Yes    Alcohol/week: 15.0 standard drinks    Types: 15 Shots of  liquor per week    Comment: Quit ETOH- 04/04/15, Heavy Use prior to this    FAMILY HISTORY:   Family History  Problem Relation Age of Onset  . Aneurysm Father   . COPD Father   . Heart attack Mother     DRUG ALLERGIES:   Allergies  Allergen Reactions  . Hydrocodone-Acetaminophen Other (See Comments)    Reaction: pt can't take anything with tylenol due to her liver disease.   . Ibuprofen Other (See Comments)    Esophageal varices  . Naproxen Other (See Comments)    Esophageal varices  . Propoxyphene Other (See Comments)    Reaction: pt isn't sure, but knows she can't take it.   . Shellfish Allergy Hives  . Tamiflu  [Oseltamivir Phosphate] Other (See Comments)    Esophageal varices    REVIEW OF SYSTEMS:   Review of Systems  Constitutional: Negative for diaphoresis, fever, malaise/fatigue and weight loss.  HENT: Negative for ear discharge, ear pain, hearing loss, nosebleeds, sore throat and tinnitus.   Eyes: Negative for blurred vision and pain.  Respiratory: Positive for cough and shortness of breath. Negative for hemoptysis and wheezing.   Cardiovascular: Negative for chest pain, palpitations, orthopnea and leg swelling.  Gastrointestinal: Negative for abdominal pain, blood in stool, constipation, diarrhea, heartburn, nausea and vomiting.  Genitourinary: Negative for dysuria, frequency and urgency.  Musculoskeletal: Negative for back  pain and myalgias.  Skin: Negative for itching and rash.  Neurological: Negative for dizziness, tingling, tremors, focal weakness, seizures, weakness and headaches.  Psychiatric/Behavioral: Negative for depression. The patient is not nervous/anxious.     MEDICATIONS AT HOME:   Prior to Admission medications   Medication Sig Start Date End Date Taking? Authorizing Provider  brompheniramine-pseudoephedrine-DM 30-2-10 MG/5ML syrup Take 5 mLs by mouth 4 (four) times daily as needed. 01/01/18   Joni Reining, PA-C  folic acid (FOLVITE) 1 MG  tablet Take 1 tablet (1 mg total) by mouth daily. 06/15/16   Kathryn Finner, MD  levothyroxine (SYNTHROID, LEVOTHROID) 175 MCG tablet Take 175 mcg by mouth daily before breakfast.     [provider]  lidocaine (LIDODERM) 5 % Place 1 patch onto the skin daily. Remove & Discard patch within 12 hours or as directed by MD 10/27/19   Kathryn Derry, PA-C  Multiple Vitamin (MULTIVITAMIN WITH MINERALS) TABS tablet Take 1 tablet by mouth daily. 06/15/16   Kathryn Finner, MD  nadolol (CORGARD) 20 MG tablet Take 1 tablet (20 mg total) by mouth daily. 06/15/16   Kathryn Finner, MD  pantoprazole (PROTONIX) 40 MG tablet Take 1 tablet (40 mg total) by mouth 2 (two) times daily before a meal. Patient taking differently: Take 40 mg by mouth daily.  04/06/15   Kathryn Dilling, MD  spironolactone (ALDACTONE) 25 MG tablet Take 1 tablet (25 mg total) by mouth daily. 04/06/15   Kathryn Dilling, MD  thiamine 50 MG tablet Take 1 tablet (50 mg total) by mouth daily. 06/15/16   Kathryn Finner, MD      VITAL SIGNS:  Blood pressure 140/80, pulse 80, temperature 98.7 F (37.1 C), temperature source Oral, resp. rate 18, height 5\' 4"  (1.626 m), weight 83.9 kg, SpO2 97 %.  PHYSICAL EXAMINATION:  Physical Exam  GENERAL:  63 y.o.-year-old patient lying in the bed with no acute distress.  EYES: Pupils equal, round, reactive to light and accommodation. No scleral icterus. Extraocular muscles intact.  HEENT: Head atraumatic, normocephalic. Oropharynx and nasopharynx clear.  NECK:  Supple, no jugular venous distention. No thyroid enlargement, no tenderness.  LUNGS: Normal breath sounds bilaterally, no wheezing, rales or crepitation. No use of accessory muscles of respiration.  Rhonchi in the left lower lobe CARDIOVASCULAR: S1, S2 normal. No murmurs, rubs, or gallops.  ABDOMEN: Soft, nontender, nondistended. Bowel sounds present. No organomegaly or mass.  EXTREMITIES: No pedal edema, cyanosis, or clubbing.  NEUROLOGIC:  Cranial nerves II through XII are intact. Muscle strength 5/5 in all extremities. Sensation intact. Gait not checked.  PSYCHIATRIC: The patient is alert and oriented x 3.  SKIN: No obvious rash, lesion, or ulcer.   LABORATORY PANEL:   CBC Recent Labs  Lab 11/20/19 1453  WBC 2.5*  HGB 10.6*  HCT 33.1*  PLT 136*   ------------------------------------------------------------------------------------------------------------------  Chemistries  Recent Labs  Lab 11/20/19 1453  NA 135  K 3.3*  CL 100  CO2 22  GLUCOSE 89  BUN 12  CREATININE 1.25*  CALCIUM 8.3*   ------------------------------------------------------------------------------------------------------------------  Cardiac Enzymes No results for input(s): TROPONINI in the last 168 hours. ------------------------------------------------------------------------------------------------------------------  RADIOLOGY:  DG Chest 2 View  Result Date: 11/20/2019 CLINICAL DATA:  Dry cough EXAM: CHEST - 2 VIEW COMPARISON:  06/12/2016 FINDINGS: Left lower lobe airspace disease along the major fissure. No effusion or pneumothorax. Normal heart size. IMPRESSION: Left lower lobe pneumonia. Electronically Signed   By: 06/14/2016 M.D.   On: 11/20/2019 11:22  IMPRESSION AND PLAN:  63 year old female being admitted for community-acquired pneumonia  *Community acquired pneumonia - Start Rocephin and Zithromax Follow pneumonia pathway/order set  * Hypothyroidism -Continue Synthroid, check TSH  *Hypertension -Continue home blood pressure medicine and monitor  * Hypokalemia Replete and recheck  All the records are reviewed and case discussed with ED provider. Management plans discussed with the patient, nursing and they are in agreement.  CODE STATUS: Full code  TOTAL TIME TAKING CARE OF THIS PATIENT: 45 minutes.    Delfino LovettVipul Lyrica Mcclarty M.D on 11/20/2019 at 4:44 PM  Between 7am to 6pm - Pager - [phone]  After 6pm go  to [email or web address] - password [email or web address]  [facility]   CC: Primary care physician; Revelo, Presley RaddleAdrian Mancheno, MD   Note: This dictation was prepared with Dragon dictation along with smaller phrase technology. Any transcriptional errors that result from this process are unintentional.

## 2019-11-20 NOTE — ED Notes (Signed)
Pt given gingerale and crackers for PO challenge

## 2019-11-21 DIAGNOSIS — U071 COVID-19: Principal | ICD-10-CM

## 2019-11-21 DIAGNOSIS — J1289 Other viral pneumonia: Secondary | ICD-10-CM

## 2019-11-21 LAB — C-REACTIVE PROTEIN
CRP: 6.8 mg/dL — ABNORMAL HIGH
CRP: 6.4 mg/dL — ABNORMAL HIGH

## 2019-11-21 LAB — FIBRINOGEN: Fibrinogen: 450 mg/dL (ref 210–475)

## 2019-11-21 LAB — FERRITIN: Ferritin: 45 ng/mL (ref 11–307)

## 2019-11-21 LAB — STREP PNEUMONIAE URINARY ANTIGEN: Strep Pneumo Urinary Antigen: NEGATIVE

## 2019-11-21 LAB — HEPATITIS B SURFACE ANTIGEN: Hepatitis B Surface Ag: NONREACTIVE

## 2019-11-21 LAB — PROCALCITONIN: Procalcitonin: <0.1 ng/mL

## 2019-11-21 LAB — HIV ANTIBODY (ROUTINE TESTING W REFLEX): HIV Screen 4th Generation wRfx: NONREACTIVE

## 2019-11-21 MED ORDER — CALCIUM CARBONATE ANTACID 500 MG PO CHEW: 1.0000 | CHEWABLE_TABLET | Freq: Three times a day (TID) | ORAL | Status: DC | PRN

## 2019-11-21 MED ORDER — MORPHINE SULFATE (PF) 2 MG/ML IV SOLN
2.0000 mg | INTRAVENOUS | Status: DC | PRN
  Administered 2019-11-21: 2 mg via INTRAVENOUS
  Filled 2019-11-21: qty 1

## 2019-11-21 MED ORDER — DOCUSATE SODIUM 100 MG PO CAPS: 100.0000 mg | ORAL_CAPSULE | Freq: Two times a day (BID) | ORAL | Status: DC | PRN

## 2019-11-21 MED ORDER — ALUM & MAG HYDROXIDE-SIMETH 200-200-20 MG/5ML PO SUSP: 15.0000 mL | Freq: Four times a day (QID) | ORAL | Status: DC | PRN

## 2019-11-21 MED ORDER — POLYETHYLENE GLYCOL 3350 17 G PO PACK: 17.0000 g | PACK | Freq: Two times a day (BID) | ORAL | Status: DC | PRN

## 2019-11-21 NOTE — Progress Notes (Signed)
PROGRESS NOTE    Kathryn [name]  OBS:[id number] DOB: 1956/06/07 DOA: 11/20/2019 PCP: Theotis Burrow, MD    Assessment & Plan:   Active Problems:   Community acquired pneumonia   Pneumonia due to COVID-19 virus   Kathryn [name]  is a 63 y.o. AA female with a known history of cirrhosis, portal HTN, sarcoidosis, asthma who presented with L lingular PNA. Pt stated that for the past week she's had worsening dry cough. She has been "hacking" non-stop and has now had associated nausea, vomiting w/ loss of appetite. She has had subjective fevers, chills. She now feels SOB even at rest.    *Community acquired pneumonia --Left lower lobe airspace disease along the major fissure.  No fever, leukocytosis.  Procal neg.  However, given the appearance of consolidation (unlike the typical COVID more diffuse pattern), will treat for bacterial PNA. --Started on ceftriaxone and azithromycin on admission. PLAN: --continue Rocephin, switch to doxy since pt has prolonged QTc --May de-escalate to PO abx and discharge tomorrow if improving.  # COVID-19 infection --positive in the ED.  No O2 requirement. --IV decadron started by admitting physician. PLAN: --will not start Remdesivir since on room air --continue steroid for now  * Hypothyroidism --TSH 31.66. Per pharmacist she hasn't been taking her home meds for a while.  --Continue current synthroid dose as is for now.  --Will need recheck TSH in few weeks as outpatient  *Hypertension -Continue home nadolol and aldactone  * Hypokalemia Replete and recheck   DVT prophylaxis: Lovenox SQ Code Status: Full code  Family Communication: none today Disposition Plan: home tomorrow with PO abx   Subjective and Interval History:  All presenting symptoms improved: dyspnea, cough, N/V/D all improved.  No fever, chest pain, abdominal pain, dysuria, increased swelling.  Abdomen the same size, per pt.   Objective: Vitals:   11/20/19 2334  11/20/19 2349 11/21/19 0535 11/21/19 0808  BP:  140/87 (!) 131/95 (!) 132/96  Pulse:  63 (!) 59 (!) 56  Resp:  18  19  Temp:  97.9 F (36.6 C) 97.6 F (36.4 C) 97.7 F (36.5 C)  TempSrc:  Oral Oral Oral  SpO2:  95% 97% 98%  Weight: 80.5 kg     Height: 5\' 4"  (1.626 m)       Intake/Output Summary (Last 24 hours) at 11/21/2019 1958 Last data filed at 11/21/2019 1600 Gross per 24 hour  Intake 1423.39 ml  Output 200 ml  Net 1223.39 ml   Filed Weights   11/20/19 1100 11/20/19 1106 11/20/19 2334  Weight: 83.9 kg 83.9 kg 80.5 kg    Examination:   Constitutional: NAD, AAOx3 HEENT: conjunctivae and lids normal, EOMI CV: RRR no M,R,G. Distal pulses +2.  No cyanosis.   RESP: CTA B/L, normal respiratory effort, on RA  GI: +BS, NTND, soft Extremities: No effusions, edema, or tenderness in BLE SKIN: warm, dry and intact Neuro: II - XII grossly intact.  Sensation intact Psych: Normal mood and affect.  Appropriate judgement and reason   Data Reviewed: I have personally reviewed following labs and imaging studies  CBC: Recent Labs  Lab 11/20/19 1453  WBC 2.5*  NEUTROABS 1.7  HGB 10.6*  HCT 33.1*  MCV 94.6  PLT 476*   Basic Metabolic Panel: Recent Labs  Lab 11/20/19 1453 11/20/19 1856  NA 135  --   K 3.3*  --   CL 100  --   CO2 22  --   GLUCOSE 89  --  BUN 12  --   CREATININE 1.25*  --   CALCIUM 8.3*  --   MG  --  2.0   GFR: Estimated Creatinine Clearance: 47.3 mL/min (A) (by C-G formula based on SCr of 1.25 mg/dL (H)). Liver Function Tests: No results for input(s): AST, ALT, ALKPHOS, BILITOT, PROT, ALBUMIN in the last 168 hours. No results for input(s): LIPASE, AMYLASE in the last 168 hours. No results for input(s): AMMONIA in the last 168 hours. Coagulation Profile: No results for input(s): INR, PROTIME in the last 168 hours. Cardiac Enzymes: No results for input(s): CKTOTAL, CKMB, CKMBINDEX, TROPONINI in the last 168 hours. BNP (last 3 results) No  results for input(s): PROBNP in the last 8760 hours. HbA1C: No results for input(s): HGBA1C in the last 72 hours. CBG: No results for input(s): GLUCAP in the last 168 hours. Lipid Profile: No results for input(s): CHOL, HDL, LDLCALC, TRIG, CHOLHDL, LDLDIRECT in the last 72 hours. Thyroid Function Tests: Recent Labs    11/20/19 1856  TSH 31.669*   Anemia Panel: Recent Labs    11/20/19 2137 11/21/19 0556  FERRITIN 47 45   Sepsis Labs: Recent Labs  Lab 11/20/19 1453 11/20/19 2137 11/21/19 0556  PROCALCITON  --  <0.10 <0.10  LATICACIDVEN 1.8  --   --     Recent Results (from the past 240 hour(s))  Blood culture (routine x 2)     Status: None (Preliminary result)   Collection Time: 11/20/19  2:53 PM   Specimen: BLOOD  Result Value Ref Range Status   Specimen Description BLOOD BLOOD LEFT FOREARM  Final   Special Requests   Final    BOTTLES DRAWN AEROBIC AND ANAEROBIC Blood Culture adequate volume   Culture   Final    NO GROWTH < 24 HOURS Performed at [facility], [address] [place], [place] [address]    Report Status PENDING  Incomplete  Blood culture (routine x 2)     Status: None (Preliminary result)   Collection Time: 11/20/19  3:00 PM   Specimen: BLOOD  Result Value Ref Range Status   Specimen Description BLOOD BLOOD LEFT WRIST  Final   Special Requests   Final    BOTTLES DRAWN AEROBIC AND ANAEROBIC Blood Culture adequate volume   Culture   Final    NO GROWTH < 24 HOURS Performed at [facility], [address] [place], [place] [address]    Report Status PENDING  Incomplete  Respiratory Panel by RT PCR (Flu A&B, Covid) - Nasopharyngeal Swab     Status: Abnormal   Collection Time: 11/20/19  5:23 PM   Specimen: Nasopharyngeal Swab  Result Value Ref Range Status   SARS Coronavirus 2 by RT PCR POSITIVE (A) NEGATIVE Final    Comment: RESULT CALLED TO, READ BACK BY AND VERIFIED WITH: DEE MCCLAIN AT 1938 ON 11/20/19  RWW (NOTE) SARS-CoV-2 target nucleic acids are DETECTED. SARS-CoV-2 RNA is generally detectable in upper respiratory specimens  during the acute phase of infection. Positive results are indicative of the presence of the identified virus, but do not rule out bacterial infection or co-infection with other pathogens not detected by the test. Clinical correlation with patient history and other diagnostic information is necessary to determine patient infection status. The expected result is Negative. Fact Sheet for Patients:  https://www.moore.com/https://www.fda.gov/media/142436/download Fact Sheet for Healthcare Providers: https://www.young.biz/https://www.fda.gov/media/142435/download This test is not yet approved or cleared by the [place] FDA and  has been authorized for detection and/or diagnosis of  SARS-CoV-2 by FDA under an Emergency Use Authorization (EUA).  This EUA will remain in effect (meaning this test can be used) f or the duration of  the COVID-19 declaration under Section 564(b)(1) of the Act, 21 U.S.C. section 360bbb-3(b)(1), unless the authorization is terminated or revoked sooner.    Influenza A by PCR NEGATIVE NEGATIVE Final   Influenza B by PCR NEGATIVE NEGATIVE Final    Comment: (NOTE) The Xpert Xpress SARS-CoV-2/FLU/RSV assay is intended as an aid in  the diagnosis of influenza from Nasopharyngeal swab specimens and  should not be used as a sole basis for treatment. Nasal washings and  aspirates are unacceptable for Xpert Xpress SARS-CoV-2/FLU/RSV  testing. Fact Sheet for Patients: https://www.moore.com/ Fact Sheet for Healthcare Providers: https://www.young.biz/ This test is not yet approved or cleared by the [place] FDA and  has been authorized for detection and/or diagnosis of SARS-CoV-2 by  FDA under an Emergency Use Authorization (EUA). This EUA will remain  in effect (meaning this test can be used) for the duration of the  Covid-19 declaration under  Section 564(b)(1) of the Act, 21  U.S.C. section 360bbb-3(b)(1), unless the authorization is  terminated or revoked. Performed at [facility], [address] [place], [place] [address]   Respiratory Panel by PCR     Status: None   Collection Time: 11/20/19  5:23 PM   Specimen: Nasopharyngeal Swab; Respiratory  Result Value Ref Range Status   Adenovirus NOT DETECTED NOT DETECTED Final   Coronavirus 229E NOT DETECTED NOT DETECTED Final    Comment: (NOTE) The Coronavirus on the Respiratory Panel, DOES NOT test for the novel  Coronavirus (2019 nCoV)    Coronavirus HKU1 NOT DETECTED NOT DETECTED Final   Coronavirus NL63 NOT DETECTED NOT DETECTED Final   Coronavirus OC43 NOT DETECTED NOT DETECTED Final   Metapneumovirus NOT DETECTED NOT DETECTED Final   Rhinovirus / Enterovirus NOT DETECTED NOT DETECTED Final   Influenza A NOT DETECTED NOT DETECTED Final   Influenza B NOT DETECTED NOT DETECTED Final   Parainfluenza Virus 1 NOT DETECTED NOT DETECTED Final   Parainfluenza Virus 2 NOT DETECTED NOT DETECTED Final   Parainfluenza Virus 3 NOT DETECTED NOT DETECTED Final   Parainfluenza Virus 4 NOT DETECTED NOT DETECTED Final   Respiratory Syncytial Virus NOT DETECTED NOT DETECTED Final   Bordetella pertussis NOT DETECTED NOT DETECTED Final   Chlamydophila pneumoniae NOT DETECTED NOT DETECTED Final   Mycoplasma pneumoniae NOT DETECTED NOT DETECTED Final    Comment: Performed at [facility], [address], [place], [place] [address]      Radiology Studies: DG Chest 2 View  Result Date: 11/20/2019 CLINICAL DATA:  Dry cough EXAM: CHEST - 2 VIEW COMPARISON:  06/12/2016 FINDINGS: Left lower lobe airspace disease along the major fissure. No effusion or pneumothorax. Normal heart size. IMPRESSION: Left lower lobe pneumonia. Electronically Signed   By: Marnee Spring M.D.   On: 11/20/2019 11:22     Scheduled Meds: . vitamin C  500 mg Oral Daily  . dexamethasone (DECADRON)  injection  6 mg Intravenous Q24H  . enoxaparin (LOVENOX) injection  40 mg Subcutaneous Q24H  . folic acid  1 mg Oral Daily  . influenza vac split quadrivalent PF  0.5 mL Intramuscular Tomorrow-1000  . levothyroxine  175 mcg Oral QAC breakfast  . multivitamin with minerals  1 tablet Oral Daily  . nadolol  20 mg Oral Daily  . pantoprazole  40 mg Oral BID AC  .  spironolactone  25 mg Oral Daily  . thiamine  50 mg Oral Daily  . zinc sulfate  220 mg Oral Daily   Continuous Infusions: . cefTRIAXone (ROCEPHIN)  IV 2 g (11/21/19 1355)  . doxycycline (VIBRAMYCIN) IV 100 mg (11/21/19 1647)     LOS: 1 day     Darlin Priestly, MD [facility] If 7PM-7AM, please contact night-coverage 11/21/2019, 7:58 PM

## 2019-11-21 NOTE — Progress Notes (Signed)
Pt c/o abdominal pain 8/10. Pt attempted to have a BM this evening on arrival to unit but unsuccessful. Reports that she had a BM this morning and bowel sounds active. Pt doesn't feel like pain is GI related. Night Provider made aware via text/page as there are no current orders PRN for pain.

## 2019-11-22 LAB — CBC
HCT: 30.9 % — ABNORMAL LOW (ref 36.0–46.0)
Hemoglobin: 9.7 g/dL — ABNORMAL LOW (ref 12.0–15.0)
MCH: 29.8 pg (ref 26.0–34.0)
MCHC: 31.4 g/dL (ref 30.0–36.0)
MCV: 95.1 fL (ref 80.0–100.0)
Platelets: 162 10*3/uL (ref 150–400)
RBC: 3.25 MIL/uL — ABNORMAL LOW (ref 3.87–5.11)
RDW: 17.2 % — ABNORMAL HIGH (ref 11.5–15.5)
WBC: 4.2 10*3/uL (ref 4.0–10.5)
nRBC: 0 % (ref 0.0–0.2)

## 2019-11-22 LAB — PROCALCITONIN: Procalcitonin: <0.1 ng/mL

## 2019-11-22 LAB — FERRITIN: Ferritin: 47 ng/mL (ref 11–307)

## 2019-11-22 LAB — BASIC METABOLIC PANEL
Anion gap: 10 (ref 5–15)
BUN: 18 mg/dL (ref 8–23)
CO2: 22 mmol/L (ref 22–32)
Calcium: 8.4 mg/dL — ABNORMAL LOW (ref 8.9–10.3)
Chloride: 107 mmol/L (ref 98–111)
Creatinine, Ser: 1.37 mg/dL — ABNORMAL HIGH (ref 0.44–1.00)
GFR calc Af Amer: 47 mL/min — ABNORMAL LOW (ref >60)
GFR calc non Af Amer: 41 mL/min — ABNORMAL LOW (ref >60)
Glucose, Bld: 169 mg/dL — ABNORMAL HIGH (ref 70–99)
Potassium: 3.8 mmol/L (ref 3.5–5.1)
Sodium: 139 mmol/L (ref 135–145)

## 2019-11-22 LAB — LEGIONELLA PNEUMOPHILA SEROGP 1 UR AG: L. pneumophila Serogp 1 Ur Ag: NEGATIVE

## 2019-11-22 LAB — C-REACTIVE PROTEIN: CRP: 3.2 mg/dL — ABNORMAL HIGH (ref <1.0)

## 2019-11-22 MED ORDER — DOXYCYCLINE HYCLATE 100 MG PO TABS
100.0000 mg | ORAL_TABLET | Freq: Two times a day (BID) | ORAL | Status: DC
  Administered 2019-11-22 – 2019-11-23 (×2): 100 mg via ORAL
  Filled 2019-11-22 (×2): qty 1

## 2019-11-22 NOTE — Progress Notes (Signed)
PHARMACIST - PHYSICIAN COMMUNICATION DR:   Priscella Mann CONCERNING: Antibiotic IV to Oral Route Change Policy  RECOMMENDATION: This patient is receiving Doxycycline by the intravenous route.  Based on criteria approved by the Pharmacy and Therapeutics Committee, the antibiotic(s) is/are being converted to the equivalent oral dose form(s).   DESCRIPTION: These criteria include:  Patient being treated for a respiratory tract infection, urinary tract infection, cellulitis or clostridium difficile associated diarrhea if on metronidazole  The patient is not neutropenic and does not exhibit a GI malabsorption state  The patient is eating (either orally or via tube) and/or has been taking other orally administered medications for a least 24 hours  The patient is improving clinically and has a Tmax < 100.5  If you have questions about this conversion, please contact the [place], PharmD, BCPS Clinical Pharmacist 11/22/2019 12:02 PM

## 2019-11-22 NOTE — Progress Notes (Signed)
PROGRESS NOTE    Kathryn [name]  WUJ:[id number] DOB: 12/04/1955 DOA: 11/20/2019 PCP: Preston Fleetingevelo, Adrian Mancheno, MD    Brief Narrative:  Kathryn [name] y.o.AA femalewith a known history of cirrhosis, portal HTN, sarcoidosis, asthma who presented with L lingular PNA. Pt stated that for the past week she's had worsening dry cough. She has been "hacking" non-stop and has now had associated nausea, vomiting w/ loss of appetite. She has had subjective fevers, chills. She now feels SOB even at rest.  12/30: No status changes.  Patient reports improvement but still some shortness of breath.  Assessment & Plan:   Active Problems:   Community acquired pneumonia   Pneumonia due to COVID-19 virus  #Community acquired pneumonia --Left lower lobe airspace disease along the major fissure.  No fever, leukocytosis.  Procal neg.  However, given the appearance of consolidation (unlike the typical COVID more diffuse pattern), will treat for bacterial PNA. --Started on ceftriaxone and azithromycin on admission. PLAN: --continue Rocephin, switch to doxy since pt has prolonged QTc --Anticipate de-escalation to p.o. antibiotics for completion of course and discharge tomorrow  # COVID-19 infection --positive in the ED.  No O2 requirement. --IV decadron started by admitting physician. PLAN: --will not start Remdesivir since on room air --continue steroid for now.  Anticipate DC on discharge  *Hypothyroidism --TSH 31.66. Per pharmacist she hasn't been taking her home meds for a while.  --Continue current synthroid dose as is for now.  --Will need recheck TSH in few weeks as outpatient  *Hypertension -Continue home nadolol and aldactone   DVT prophylaxis: Lovenox SQ Code Status: Full code  Family Communication: none today Disposition Plan: home tomorrow with PO abx    Consultants:   none  Procedures: none Antimicrobials: rocephin, doxycycline  Subjective: Seen and examined No  acute events overnight No new complaints Continues to endorse shortness of breath though improved over interval  Objective: Vitals:   11/21/19 2042 11/22/19 0433 11/22/19 0730 11/22/19 1508  BP: (!) 112/98 125/88 135/82 137/88  Pulse: 70 (!) 55 (!) 58 (!) 59  Resp: 20 20 18 18   Temp: 98 F (36.7 C) 97.8 F (36.6 C) 97.7 F (36.5 C) 98.4 F (36.9 C)  TempSrc:  Oral Oral Oral  SpO2: 100% 97% 95% 97%  Weight:  83.6 kg    Height:        Intake/Output Summary (Last 24 hours) at 11/22/2019 1635 Last data filed at 11/22/2019 1000 Gross per 24 hour  Intake 480 ml  Output 500 ml  Net -20 ml   Filed Weights   11/20/19 1106 11/20/19 2334 11/22/19 0433  Weight: 83.9 kg 80.5 kg 83.6 kg    Examination:  General exam: Appears calm and comfortable  Respiratory system: Clear to auscultation. Respiratory effort normal. Cardiovascular system: S1 & S2 heard, RRR. No JVD, murmurs, rubs, gallops or clicks. No pedal edema. Gastrointestinal system: Abdomen is nondistended, soft and nontender. No organomegaly or masses felt. Normal bowel sounds heard. Central nervous system: Alert and oriented. No focal neurological deficits. Extremities: Symmetric 5 x 5 power. Skin: No rashes, lesions or ulcers Psychiatry: Judgement and insight appear normal. Mood & affect appropriate.     Data Reviewed: I have personally reviewed following labs and imaging studies  CBC: Recent Labs  Lab 11/20/19 1453 11/22/19 0548  WBC 2.5* 4.2  NEUTROABS 1.7  --   HGB 10.6* 9.7*  HCT 33.1* 30.9*  MCV 94.6 95.1  PLT 136* 162   Basic Metabolic Panel: Recent Labs  Lab 11/20/19 1453 11/20/19 1856 11/22/19 0548  NA 135  --  139  K 3.3*  --  3.8  CL 100  --  107  CO2 22  --  22  GLUCOSE 89  --  169*  BUN 12  --  18  CREATININE 1.25*  --  1.37*  CALCIUM 8.3*  --  8.4*  MG  --  2.0  --    GFR: Estimated Creatinine Clearance: 44 mL/min (A) (by C-G formula based on SCr of 1.37 mg/dL (H)). Liver Function  Tests: No results for input(s): AST, ALT, ALKPHOS, BILITOT, PROT, ALBUMIN in the last 168 hours. No results for input(s): LIPASE, AMYLASE in the last 168 hours. No results for input(s): AMMONIA in the last 168 hours. Coagulation Profile: No results for input(s): INR, PROTIME in the last 168 hours. Cardiac Enzymes: No results for input(s): CKTOTAL, CKMB, CKMBINDEX, TROPONINI in the last 168 hours. BNP (last 3 results) No results for input(s): PROBNP in the last 8760 hours. HbA1C: No results for input(s): HGBA1C in the last 72 hours. CBG: No results for input(s): GLUCAP in the last 168 hours. Lipid Profile: No results for input(s): CHOL, HDL, LDLCALC, TRIG, CHOLHDL, LDLDIRECT in the last 72 hours. Thyroid Function Tests: Recent Labs    11/20/19 1856  TSH 31.669*   Anemia Panel: Recent Labs    11/21/19 0556 11/22/19 0548  FERRITIN 45 47   Sepsis Labs: Recent Labs  Lab 11/20/19 1453 11/20/19 2137 11/21/19 0556 11/22/19 0548  PROCALCITON  --  <0.10 <0.10 <0.10  LATICACIDVEN 1.8  --   --   --     Recent Results (from the past 240 hour(s))  Blood culture (routine x 2)     Status: None (Preliminary result)   Collection Time: 11/20/19  2:53 PM   Specimen: BLOOD  Result Value Ref Range Status   Specimen Description BLOOD BLOOD LEFT FOREARM  Final   Special Requests   Final    BOTTLES DRAWN AEROBIC AND ANAEROBIC Blood Culture adequate volume   Culture   Final    NO GROWTH < 24 HOURS Performed at [facility], [address], [place], [place] [address]    Report Status PENDING  Incomplete  Blood culture (routine x 2)     Status: None (Preliminary result)   Collection Time: 11/20/19  3:00 PM   Specimen: BLOOD  Result Value Ref Range Status   Specimen Description BLOOD BLOOD LEFT WRIST  Final   Special Requests   Final    BOTTLES DRAWN AEROBIC AND ANAEROBIC Blood Culture adequate volume   Culture   Final    NO GROWTH < 24 HOURS Performed at [facility], [address], [place], [place] [address]    Report Status PENDING  Incomplete  Respiratory Panel by RT PCR (Flu A&B, Covid) - Nasopharyngeal Swab     Status: Abnormal   Collection Time: 11/20/19  5:23 PM   Specimen: Nasopharyngeal Swab  Result Value Ref Range Status   SARS Coronavirus 2 by RT PCR POSITIVE (A) NEGATIVE Final    Comment: RESULT CALLED TO, READ BACK BY AND VERIFIED WITH: DEE MCCLAIN AT 1938 ON 11/20/19 RWW (NOTE) SARS-CoV-2 target nucleic acids are DETECTED. SARS-CoV-2 RNA is generally detectable in upper respiratory specimens  during the acute phase of infection. Positive results are indicative of the presence of the identified virus, but do not rule out bacterial infection or co-infection with other pathogens not detected by the test. Clinical  correlation with patient history and other diagnostic information is necessary to determine patient infection status. The expected result is Negative. Fact Sheet for Patients:  PinkCheek.be Fact Sheet for Healthcare Providers: GravelBags.it This test is not yet approved or cleared by the [place] FDA and  has been authorized for detection and/or diagnosis of SARS-CoV-2 by FDA under an Emergency Use Authorization (EUA).  This EUA will remain in effect (meaning this test can be used) f or the duration of  the COVID-19 declaration under Section 564(b)(1) of the Act, 21 U.S.C. section 360bbb-3(b)(1), unless the authorization is terminated or revoked sooner.    Influenza A by PCR NEGATIVE NEGATIVE Final   Influenza B by PCR NEGATIVE NEGATIVE Final    Comment: (NOTE) The Xpert Xpress SARS-CoV-2/FLU/RSV assay is intended as an aid in  the diagnosis of influenza from Nasopharyngeal swab specimens and  should not be used as a sole basis for treatment. Nasal washings and  aspirates are unacceptable for Xpert Xpress SARS-CoV-2/FLU/RSV  testing. Fact Sheet for  Patients: PinkCheek.be Fact Sheet for Healthcare Providers: GravelBags.it This test is not yet approved or cleared by the [place] FDA and  has been authorized for detection and/or diagnosis of SARS-CoV-2 by  FDA under an Emergency Use Authorization (EUA). This EUA will remain  in effect (meaning this test can be used) for the duration of the  Covid-19 declaration under Section 564(b)(1) of the Act, 21  U.S.C. section 360bbb-3(b)(1), unless the authorization is  terminated or revoked. Performed at [facility], [address] [place], [place] [address]   Respiratory Panel by PCR     Status: None   Collection Time: 11/20/19  5:23 PM   Specimen: Nasopharyngeal Swab; Respiratory  Result Value Ref Range Status   Adenovirus NOT DETECTED NOT DETECTED Final   Coronavirus 229E NOT DETECTED NOT DETECTED Final    Comment: (NOTE) The Coronavirus on the Respiratory Panel, DOES NOT test for the novel  Coronavirus (2019 nCoV)    Coronavirus HKU1 NOT DETECTED NOT DETECTED Final   Coronavirus NL63 NOT DETECTED NOT DETECTED Final   Coronavirus OC43 NOT DETECTED NOT DETECTED Final   Metapneumovirus NOT DETECTED NOT DETECTED Final   Rhinovirus / Enterovirus NOT DETECTED NOT DETECTED Final   Influenza A NOT DETECTED NOT DETECTED Final   Influenza B NOT DETECTED NOT DETECTED Final   Parainfluenza Virus 1 NOT DETECTED NOT DETECTED Final   Parainfluenza Virus 2 NOT DETECTED NOT DETECTED Final   Parainfluenza Virus 3 NOT DETECTED NOT DETECTED Final   Parainfluenza Virus 4 NOT DETECTED NOT DETECTED Final   Respiratory Syncytial Virus NOT DETECTED NOT DETECTED Final   Bordetella pertussis NOT DETECTED NOT DETECTED Final   Chlamydophila pneumoniae NOT DETECTED NOT DETECTED Final   Mycoplasma pneumoniae NOT DETECTED NOT DETECTED Final    Comment: Performed at [facility], [place]. [address] [place], [place] [address]          Radiology Studies: No results found.      Scheduled Meds: . vitamin C  500 mg Oral Daily  . dexamethasone (DECADRON) injection  6 mg Intravenous Q24H  . doxycycline  100 mg Oral Q12H  . enoxaparin (LOVENOX) injection  40 mg Subcutaneous Q24H  . folic acid  1 mg Oral Daily  . influenza vac split quadrivalent PF  0.5 mL Intramuscular Tomorrow-1000  . levothyroxine  175 mcg Oral QAC breakfast  . multivitamin with minerals  1 tablet Oral Daily  . nadolol  20  mg Oral Daily  . pantoprazole  40 mg Oral BID AC  . spironolactone  25 mg Oral Daily  . thiamine  50 mg Oral Daily  . zinc sulfate  220 mg Oral Daily   Continuous Infusions: . cefTRIAXone (ROCEPHIN)  IV 2 g (11/22/19 1617)     LOS: 2 days    Time spent: 35 minutes    Tresa Moore, MD [facility] Pager [phone]  If 7PM-7AM, please contact night-coverage [email or web address] Password [facility] 11/22/2019, 4:35 PM

## 2019-11-22 NOTE — Evaluation (Addendum)
Physical Therapy Evaluation Patient Details Name: Kathryn [name] MRN: [id number] DOB: 09/26/1956 Today's Date: 11/22/2019   History of Present Illness  Pt admitted for CAP secondary to covid virus. History includes cirrhosis, HTN, and asthma.   Clinical Impression  Pt is a pleasant 63 year old female who was admitted for CAP secondary to covid virus. Pt performs bed mobility/transfers with mod I and ambulation with cga and IV pole. Further ambulation performed without AD, however slightly unsteady. Pt demonstrates deficits with balance/endurance. Pt reports after ambulation, she feels tired. Increased time required to complete 5 time sit<>stand indicating decreased power. Would benefit from skilled PT to address above deficits and promote optimal return to PLOF. Recommending she continue mobility efforts with SPC. Currently recommending OP PT. She is unsure if she really needs further therapy and prefers to think about future OP PT after discharge.    Follow Up Recommendations Outpatient PT    Equipment Recommendations  Cane    Recommendations for Other Services       Precautions / Restrictions Precautions Precautions: Fall Restrictions Weight Bearing Restrictions: No      Mobility  Bed Mobility Overal bed mobility: Modified Independent             General bed mobility comments: use of railing  Transfers Overall transfer level: Modified independent Equipment used: 1 person hand held assist             General transfer comment: safe technique. Slightly unsteady during initial stand. Once standing, able to maintain standing balance without HHA.  Ambulation/Gait Ambulation/Gait assistance: Min guard Gait Distance (Feet): 60 Feet Assistive device: IV Pole Gait Pattern/deviations: Step-through pattern     General Gait Details: ambulated several laps in room with IV pole. Safe technique with turns. No SOB symptoms as all mobility performed on RA.  Further ambulation  performed in ther-ex  Stairs            Wheelchair Mobility    Modified Rankin (Stroke Patients Only)       Balance Overall balance assessment: Needs assistance;History of Falls Sitting-balance support: Feet supported Sitting balance-Leahy Scale: Good     Standing balance support: Single extremity supported Standing balance-Leahy Scale: Good Standing balance comment: needs HHA on IV pole                             Pertinent Vitals/Pain Pain Assessment: No/denies pain    Home Living Family/patient expects to be discharged to:: Private residence Living Arrangements: Children(daughter/SIL/grandsons) Available Help at Discharge: Family Type of Home: House Home Access: Stairs to enter Entrance Stairs-Rails: Can reach both Entrance Stairs-Number of Steps: 5 Home Layout: One level Home Equipment: Walker - 2 wheels      Prior Function Level of Independence: Independent         Comments: previously indep, however reports recent falls. Is asking for a SPC as she doesn't want to become reliant on a RW     Hand Dominance        Extremity/Trunk Assessment   Upper Extremity Assessment Upper Extremity Assessment: Overall WFL for tasks assessed    Lower Extremity Assessment Lower Extremity Assessment: Overall WFL for tasks assessed       Communication   Communication: No difficulties  Cognition Arousal/Alertness: Awake/alert Behavior During Therapy: WFL for tasks assessed/performed Overall Cognitive Status: Within Functional Limits for tasks assessed  General Comments      Exercises Other Exercises Other Exercises: Further ambulation performed using no AD. Slightly unsteady gait pattern with wide BOS and decreased speed. No formal LOB noted. 20' performed Other Exercises: 5 time sit<>Stand performed in 15 seconds without use of hands   Assessment/Plan    PT Assessment Patient needs  continued PT services  PT Problem List Decreased activity tolerance;Decreased balance;Decreased mobility       PT Treatment Interventions Gait training;DME instruction;Therapeutic activities;Balance training    PT Goals (Current goals can be found in the Care Plan section)  Acute Rehab PT Goals Patient Stated Goal: to go home PT Goal Formulation: With patient Time For Goal Achievement: 12/06/19 Potential to Achieve Goals: Good    Frequency Min 2X/week   Barriers to discharge        Co-evaluation               AM-PAC PT "6 Clicks" Mobility  Outcome Measure Help needed turning from your back to your side while in a flat bed without using bedrails?: None Help needed moving from lying on your back to sitting on the side of a flat bed without using bedrails?: None Help needed moving to and from a bed to a chair (including a wheelchair)?: A Little Help needed standing up from a chair using your arms (e.g., wheelchair or bedside chair)?: A Little Help needed to walk in hospital room?: A Little Help needed climbing 3-5 steps with a railing? : A Little 6 Click Score: 20    End of Session Equipment Utilized During Treatment: Gait belt Activity Tolerance: Patient tolerated treatment well Patient left: in bed;with bed alarm set Nurse Communication: Mobility status PT Visit Diagnosis: Unsteadiness on feet (R26.81);Muscle weakness (generalized) (M62.81);History of falling (Z91.81);Difficulty in walking, not elsewhere classified (R26.2)    Time: 5188-4166 PT Time Calculation (min) (ACUTE ONLY): 22 min   Charges:   PT Evaluation $PT Eval Low Complexity: 1 Low PT Treatments $Gait Training: 8-22 mins        Greggory Stallion, PT, DPT [phone]   Kathryn [name] 11/22/2019, 4:39 PM

## 2019-11-23 LAB — CBC
HCT: 28.8 % — ABNORMAL LOW (ref 36.0–46.0)
Hemoglobin: 9.4 g/dL — ABNORMAL LOW (ref 12.0–15.0)
MCH: 29.7 pg (ref 26.0–34.0)
MCHC: 32.6 g/dL (ref 30.0–36.0)
MCV: 90.9 fL (ref 80.0–100.0)
Platelets: 178 10*3/uL (ref 150–400)
RBC: 3.17 MIL/uL — ABNORMAL LOW (ref 3.87–5.11)
RDW: 17.2 % — ABNORMAL HIGH (ref 11.5–15.5)
WBC: 4.5 10*3/uL (ref 4.0–10.5)
nRBC: 0 % (ref 0.0–0.2)

## 2019-11-23 LAB — FERRITIN: Ferritin: 29 ng/mL (ref 11–307)

## 2019-11-23 LAB — C-REACTIVE PROTEIN: CRP: 1.5 mg/dL — ABNORMAL HIGH (ref <1.0)

## 2019-11-23 MED ORDER — DEXAMETHASONE 6 MG PO TABS: 6.0000 mg | ORAL_TABLET | Freq: Every day | ORAL | 0 refills | Status: AC

## 2019-11-23 MED ORDER — AMOXICILLIN-POT CLAVULANATE 875-125 MG PO TABS: 1.0000 | ORAL_TABLET | Freq: Two times a day (BID) | ORAL | 0 refills | Status: AC

## 2019-11-23 MED ORDER — LEVOTHYROXINE SODIUM 175 MCG PO TABS: 175.0000 ug | ORAL_TABLET | Freq: Every day | ORAL | 0 refills | Status: DC

## 2019-11-23 MED ORDER — SPIRONOLACTONE 25 MG PO TABS: 25.0000 mg | ORAL_TABLET | Freq: Every day | ORAL | 0 refills | Status: DC

## 2019-11-23 MED ORDER — NADOLOL 20 MG PO TABS: 20.0000 mg | ORAL_TABLET | Freq: Every day | ORAL | 0 refills | Status: DC

## 2019-11-23 NOTE — Discharge Summary (Signed)
Physician Discharge Summary  ALMETER WESTHOFF ZOX:[id number] DOB: [birth year] DOA: 11/20/2019  PCP: Preston Fleeting, MD  Admit date: 11/20/2019 Discharge date: 11/23/2019  Admitted From: Home Disposition: Home  Recommendations for Outpatient Follow-up:  1. Follow up with PCP in 1-2 weeks   Home Health: No Equipment/Devices: None  Discharge Condition: Stable CODE STATUS: Full Diet recommendation: Heart Healthy  Brief/Interim Summary: Kathryn [name] y.o.AAfemalewith a known history of cirrhosis, portal HTN, sarcoidosis, asthma who presentedwith L lingular PNA. Pt statedthat for the past week she's had worsening dry cough. She has been "hacking" non-stop and has now had associated nausea, vomiting w/ loss of appetite. She has had subjective fevers, chills. She now feels SOB even at rest.  Discharge Diagnoses:  Active Problems:   Community acquired pneumonia   Pneumonia due to COVID-19 virus  #Community acquired pneumonia --Left lower lobe airspace disease along the major fissure. No fever, leukocytosis. Procal neg. However, given the appearance of consolidation (unlike the typical COVID more diffuse pattern), will treat for bacterial PNA. --Started on ceftriaxone and azithromycin on admission. - Responded to treatment - de escalate to augmentin on discharge to complete antibiotic course   # COVID-19 infection --positive in the ED. No O2 requirement. --IV decadron started by admitting physician. --no indication for Remdesivir since on room air --  decadron Qd prescribed to complete 10 day course  *Hypothyroidism --TSH 31.66. Per pharmacist she hasn't been taking her home meds for a while. --Continue current synthroid dose as is for now. --Will need recheckTSHin few weeksas outpatient - Re prescribed synthroid on discharge  *Hypertension -Continue homenadolol and aldactone - Re prescribed on discharge - Patient needs to re-establish care with  PCP  Discharge Instructions  Discharge Instructions    Ambulatory referral to Physical Therapy   Complete by: As directed    Diet - low sodium heart healthy   Complete by: As directed    Increase activity slowly   Complete by: As directed    MyChart COVID-19 home monitoring program   Complete by: Nov 23, 2019    Is the patient willing to use the MyChart Mobile App for home monitoring?: Yes   Temperature monitoring   Complete by: Nov 23, 2019    After how many days would you like to receive a notification of this patient's flowsheet entries?: 1     Allergies as of 11/23/2019      Reactions   Hydrocodone-acetaminophen Other (See Comments)   Reaction: pt can't take anything with tylenol due to her liver disease.    Ibuprofen Other (See Comments)   Esophageal varices   Naproxen Other (See Comments)   Esophageal varices   Propoxyphene Other (See Comments)   Reaction: pt isn't sure, but knows she can't take it.    Shellfish Allergy Hives   Tamiflu  [oseltamivir Phosphate] Other (See Comments)   Esophageal varices      Medication List    TAKE these medications   amoxicillin-clavulanate 875-125 MG tablet Commonly known as: Augmentin Take 1 tablet by mouth 2 (two) times daily for 4 days.   dexamethasone 6 MG tablet Commonly known as: Decadron Take 1 tablet (6 mg total) by mouth daily for 4 days.   levothyroxine 175 MCG tablet Commonly known as: SYNTHROID Take 1 tablet (175 mcg total) by mouth daily before breakfast.   nadolol 20 MG tablet Commonly known as: CORGARD Take 1 tablet (20 mg total) by mouth daily.   spironolactone 25 MG tablet Commonly known as: ALDACTONE Take  1 tablet (25 mg total) by mouth daily.      Follow-up Information    Revelo, Elyse Jarvis, MD Follow up in 1 week(s).   Specialty: Family Medicine Contact information: [address] [place] [address] [phone]          Allergies  Allergen Reactions  .  Hydrocodone-Acetaminophen Other (See Comments)    Reaction: pt can't take anything with tylenol due to her liver disease.   . Ibuprofen Other (See Comments)    Esophageal varices  . Naproxen Other (See Comments)    Esophageal varices  . Propoxyphene Other (See Comments)    Reaction: pt isn't sure, but knows she can't take it.   . Shellfish Allergy Hives  . Tamiflu  [Oseltamivir Phosphate] Other (See Comments)    Esophageal varices    Consultations:  none   Procedures/Studies: DG Chest 2 View  Result Date: 11/20/2019 CLINICAL DATA:  Dry cough EXAM: CHEST - 2 VIEW COMPARISON:  06/12/2016 FINDINGS: Left lower lobe airspace disease along the major fissure. No effusion or pneumothorax. Normal heart size. IMPRESSION: Left lower lobe pneumonia. Electronically Signed   By: Monte Fantasia M.D.   On: 11/20/2019 11:22   DG Lumbar Spine 2-3 Views  Result Date: 10/26/2019 CLINICAL DATA:  Fall, low back pain and left hip pain. EXAM: LUMBAR SPINE - 2-3 VIEW COMPARISON:  None. FINDINGS: Coarsened trabecula throughout the L5 vertebral body, likely hemangioma. Degenerative facet disease throughout the lumbar spine. Disc spaces are maintained. Normal alignment. No fracture. Aortoiliac atherosclerosis. No aneurysm. IMPRESSION: No acute bony abnormality. Degenerative facet disease diffusely throughout the lumbar spine. Aortoiliac atherosclerosis. Electronically Signed   By: Rolm Baptise M.D.   On: 10/26/2019 21:07   CT Head Wo Contrast  Result Date: 10/26/2019 CLINICAL DATA:  Head trauma, fall EXAM: CT HEAD WITHOUT CONTRAST TECHNIQUE: Contiguous axial images were obtained from the base of the skull through the vertex without intravenous contrast. COMPARISON:  None. FINDINGS: Brain: No acute intracranial abnormality. Specifically, no hemorrhage, hydrocephalus, mass lesion, acute infarction, or significant intracranial injury. Vascular: No hyperdense vessel or unexpected calcification. Skull: No acute  calvarial abnormality. Sinuses/Orbits: Visualized paranasal sinuses and mastoids clear. Orbital soft tissues unremarkable. Other: None IMPRESSION: No acute intracranial abnormality. Electronically Signed   By: Rolm Baptise M.D.   On: 10/26/2019 23:09   CT Cervical Spine Wo Contrast  Result Date: 10/26/2019 CLINICAL DATA:  Fall EXAM: CT CERVICAL SPINE WITHOUT CONTRAST TECHNIQUE: Multidetector CT imaging of the cervical spine was performed without intravenous contrast. Multiplanar CT image reconstructions were also generated. COMPARISON:  None. FINDINGS: Alignment: No subluxation Skull base and vertebrae: No acute fracture. No primary bone lesion or focal pathologic process. Soft tissues and spinal canal: No prevertebral fluid or swelling. No visible canal hematoma. Disc levels: Degenerative disc disease, most pronounced at C3-4 and C4-5. Upper chest: No acute findings Other: None IMPRESSION: Cervical spondylosis.  No acute bony abnormality. Electronically Signed   By: Rolm Baptise M.D.   On: 10/26/2019 23:10   DG Hip Unilat W or Wo Pelvis 2-3 Views Left  Result Date: 10/26/2019 CLINICAL DATA:  , Low back and left hip pain. EXAM: DG HIP (WITH OR WITHOUT PELVIS) 2-3V LEFT COMPARISON:  None. FINDINGS: Hip joints are symmetric. Early spurring bilaterally. SI joints symmetric and unremarkable. No acute bony abnormality. Specifically, no fracture, subluxation, or dislocation. IMPRESSION: No acute bony abnormality. Electronically Signed   By: Rolm Baptise M.D.   On: 10/26/2019 21:06  Subjective: Seen and examined on the day of discharge No acute status changes Feels well On room air  Discharge Exam: Vitals:   11/23/19 0335 11/23/19 0708  BP: 115/80 139/81  Pulse: (!) 58 (!) 57  Resp: 18 18  Temp: 98.5 F (36.9 C) 97.9 F (36.6 C)  SpO2: 95% 98%   Vitals:   11/22/19 1508 11/22/19 2133 11/23/19 0335 11/23/19 0708  BP: 137/88 (!) 129/92 115/80 139/81  Pulse: (!) 59 (!) 58 (!) 58 (!) 57   Resp: Temp: 98.4 F (36.9 C) 98.4 F (36.9 C) 98.5 F (36.9 C) 97.9 F (36.6 C)  TempSrc: Oral Oral Oral Oral  SpO2: 97% 97% 95% 98%  Weight:   84.8 kg   Height:        General: Pt is alert, awake, not in acute distress Cardiovascular: RRR, S1/S2 +, no rubs, no gallops Respiratory: CTA bilaterally, no wheezing, no rhonchi Abdominal: Soft, NT, ND, bowel sounds + Extremities: no edema, no cyanosis    The results of significant diagnostics from this hospitalization (including imaging, microbiology, ancillary and laboratory) are listed below for reference.     Microbiology: Recent Results (from the past 240 hour(s))  Blood culture (routine x 2)     Status: None (Preliminary result)   Collection Time: 11/20/19  2:53 PM   Specimen: BLOOD  Result Value Ref Range Status   Specimen Description BLOOD BLOOD LEFT FOREARM  Final   Special Requests   Final    BOTTLES DRAWN AEROBIC AND ANAEROBIC Blood Culture adequate volume   Culture   Final    NO GROWTH 3 DAYS Performed at [facility], [address] [place], [place] [address]    Report Status PENDING  Incomplete  Blood culture (routine x 2)     Status: None (Preliminary result)   Collection Time: 11/20/19  3:00 PM   Specimen: BLOOD  Result Value Ref Range Status   Specimen Description BLOOD BLOOD LEFT WRIST  Final   Special Requests   Final    BOTTLES DRAWN AEROBIC AND ANAEROBIC Blood Culture adequate volume   Culture   Final    NO GROWTH 3 DAYS Performed at [facility], [address], [place], [place] [address]    Report Status PENDING  Incomplete  Respiratory Panel by RT PCR (Flu A&B, Covid) - Nasopharyngeal Swab     Status: Abnormal   Collection Time: 11/20/19  5:23 PM   Specimen: Nasopharyngeal Swab  Result Value Ref Range Status   SARS Coronavirus 2 by RT PCR POSITIVE (A) NEGATIVE Final    Comment: RESULT CALLED TO, READ BACK BY AND VERIFIED WITH: DEE MCCLAIN AT 1938 ON 11/20/19  RWW (NOTE) SARS-CoV-2 target nucleic acids are DETECTED. SARS-CoV-2 RNA is generally detectable in upper respiratory specimens  during the acute phase of infection. Positive results are indicative of the presence of the identified virus, but do not rule out bacterial infection or co-infection with other pathogens not detected by the test. Clinical correlation with patient history and other diagnostic information is necessary to determine patient infection status. The expected result is Negative. Fact Sheet for Patients:  https://www.moore.com/ Fact Sheet for Healthcare Providers: https://www.young.biz/ This test is not yet approved or cleared by the [place] FDA and  has been authorized for detection and/or diagnosis of SARS-CoV-2 by FDA under an Emergency Use Authorization (EUA).  This EUA will remain in effect (meaning this test can be used) f or the duration  of  the COVID-19 declaration under Section 564(b)(1) of the Act, 21 U.S.C. section 360bbb-3(b)(1), unless the authorization is terminated or revoked sooner.    Influenza A by PCR NEGATIVE NEGATIVE Final   Influenza B by PCR NEGATIVE NEGATIVE Final    Comment: (NOTE) The Xpert Xpress SARS-CoV-2/FLU/RSV assay is intended as an aid in  the diagnosis of influenza from Nasopharyngeal swab specimens and  should not be used as a sole basis for treatment. Nasal washings and  aspirates are unacceptable for Xpert Xpress SARS-CoV-2/FLU/RSV  testing. Fact Sheet for Patients: https://www.moore.com/https://www.fda.gov/media/142436/download Fact Sheet for Healthcare Providers: https://www.young.biz/https://www.fda.gov/media/142435/download This test is not yet approved or cleared by the [place] FDA and  has been authorized for detection and/or diagnosis of SARS-CoV-2 by  FDA under an Emergency Use Authorization (EUA). This EUA will remain  in effect (meaning this test can be used) for the duration of the  Covid-19 declaration under  Section 564(b)(1) of the Act, 21  U.S.C. section 360bbb-3(b)(1), unless the authorization is  terminated or revoked. Performed at [facility], [address] [place], [place] [address]   Respiratory Panel by PCR     Status: None   Collection Time: 11/20/19  5:23 PM   Specimen: Nasopharyngeal Swab; Respiratory  Result Value Ref Range Status   Adenovirus NOT DETECTED NOT DETECTED Final   Coronavirus 229E NOT DETECTED NOT DETECTED Final    Comment: (NOTE) The Coronavirus on the Respiratory Panel, DOES NOT test for the novel  Coronavirus (2019 nCoV)    Coronavirus HKU1 NOT DETECTED NOT DETECTED Final   Coronavirus NL63 NOT DETECTED NOT DETECTED Final   Coronavirus OC43 NOT DETECTED NOT DETECTED Final   Metapneumovirus NOT DETECTED NOT DETECTED Final   Rhinovirus / Enterovirus NOT DETECTED NOT DETECTED Final   Influenza A NOT DETECTED NOT DETECTED Final   Influenza B NOT DETECTED NOT DETECTED Final   Parainfluenza Virus 1 NOT DETECTED NOT DETECTED Final   Parainfluenza Virus 2 NOT DETECTED NOT DETECTED Final   Parainfluenza Virus 3 NOT DETECTED NOT DETECTED Final   Parainfluenza Virus 4 NOT DETECTED NOT DETECTED Final   Respiratory Syncytial Virus NOT DETECTED NOT DETECTED Final   Bordetella pertussis NOT DETECTED NOT DETECTED Final   Chlamydophila pneumoniae NOT DETECTED NOT DETECTED Final   Mycoplasma pneumoniae NOT DETECTED NOT DETECTED Final    Comment: Performed at [facility], [address] [place], [place] [address]     Labs: BNP (last 3 results) No results for input(s): BNP in the last 8760 hours. Basic Metabolic Panel: Recent Labs  Lab 11/20/19 1453 11/20/19 1856 11/22/19 0548  NA 135  --  139  K 3.3*  --  3.8  CL 100  --  107  CO2 22  --  22  GLUCOSE 89  --  169*  BUN 12  --  18  CREATININE 1.25*  --  1.37*  CALCIUM 8.3*  --  8.4*  MG  --  2.0  --    Liver Function Tests: No results for input(s): AST, ALT, ALKPHOS, BILITOT, PROT, ALBUMIN  in the last 168 hours. No results for input(s): LIPASE, AMYLASE in the last 168 hours. No results for input(s): AMMONIA in the last 168 hours. CBC: Recent Labs  Lab 11/20/19 1453 11/22/19 0548 11/23/19 0429  WBC 2.5* 4.2 4.5  NEUTROABS 1.7  --   --   HGB 10.6* 9.7* 9.4*  HCT 33.1* 30.9* 28.8*  MCV 94.6 95.1 90.9  PLT 136* 162  178   Cardiac Enzymes: No results for input(s): CKTOTAL, CKMB, CKMBINDEX, TROPONINI in the last 168 hours. BNP: Invalid input(s): POCBNP CBG: No results for input(s): GLUCAP in the last 168 hours. D-Dimer No results for input(s): DDIMER in the last 72 hours. Hgb A1c No results for input(s): HGBA1C in the last 72 hours. Lipid Profile No results for input(s): CHOL, HDL, LDLCALC, TRIG, CHOLHDL, LDLDIRECT in the last 72 hours. Thyroid function studies Recent Labs    11/20/19 1856  TSH 31.669*   Anemia work up Recent Labs    11/22/19 0548 11/23/19 0429  FERRITIN 47 29   Urinalysis    Component Value Date/Time   COLORURINE YELLOW (A) 02/12/2016 2246   APPEARANCEUR HAZY (A) 02/12/2016 2246   APPEARANCEUR Clear 09/25/2014 1610   LABSPEC 1.009 02/12/2016 2246   LABSPEC 1.023 09/25/2014 1610   PHURINE 6.0 02/12/2016 2246   GLUCOSEU NEGATIVE 02/12/2016 2246   GLUCOSEU Negative 09/25/2014 1610   HGBUR NEGATIVE 02/12/2016 2246   BILIRUBINUR NEGATIVE 02/12/2016 2246   BILIRUBINUR Negative 09/25/2014 1610   KETONESUR NEGATIVE 02/12/2016 2246   PROTEINUR NEGATIVE 02/12/2016 2246   NITRITE NEGATIVE 02/12/2016 2246   LEUKOCYTESUR NEGATIVE 02/12/2016 2246   LEUKOCYTESUR Negative 09/25/2014 1610   Sepsis Labs Invalid input(s): PROCALCITONIN,  WBC,  LACTICIDVEN Microbiology Recent Results (from the past 240 hour(s))  Blood culture (routine x 2)     Status: None (Preliminary result)   Collection Time: 11/20/19  2:53 PM   Specimen: BLOOD  Result Value Ref Range Status   Specimen Description BLOOD BLOOD LEFT FOREARM  Final   Special Requests   Final     BOTTLES DRAWN AEROBIC AND ANAEROBIC Blood Culture adequate volume   Culture   Final    NO GROWTH 3 DAYS Performed at [facility], [address] [place], [place] [address]    Report Status PENDING  Incomplete  Blood culture (routine x 2)     Status: None (Preliminary result)   Collection Time: 11/20/19  3:00 PM   Specimen: BLOOD  Result Value Ref Range Status   Specimen Description BLOOD BLOOD LEFT WRIST  Final   Special Requests   Final    BOTTLES DRAWN AEROBIC AND ANAEROBIC Blood Culture adequate volume   Culture   Final    NO GROWTH 3 DAYS Performed at [facility], [address] [place], [place] [address]    Report Status PENDING  Incomplete  Respiratory Panel by RT PCR (Flu A&B, Covid) - Nasopharyngeal Swab     Status: Abnormal   Collection Time: 11/20/19  5:23 PM   Specimen: Nasopharyngeal Swab  Result Value Ref Range Status   SARS Coronavirus 2 by RT PCR POSITIVE (A) NEGATIVE Final    Comment: RESULT CALLED TO, READ BACK BY AND VERIFIED WITH: DEE MCCLAIN AT 1938 ON 11/20/19 RWW (NOTE) SARS-CoV-2 target nucleic acids are DETECTED. SARS-CoV-2 RNA is generally detectable in upper respiratory specimens  during the acute phase of infection. Positive results are indicative of the presence of the identified virus, but do not rule out bacterial infection or co-infection with other pathogens not detected by the test. Clinical correlation with patient history and other diagnostic information is necessary to determine patient infection status. The expected result is Negative. Fact Sheet for Patients:  https://www.moore.com/ Fact Sheet for Healthcare Providers: https://www.young.biz/ This test is not yet approved or cleared by the [place] FDA and  has been authorized for detection and/or diagnosis of SARS-CoV-2 by FDA under an  Emergency Use Authorization (EUA).  This EUA will remain in effect (meaning this  test can be used) f or the duration of  the COVID-19 declaration under Section 564(b)(1) of the Act, 21 U.S.C. section 360bbb-3(b)(1), unless the authorization is terminated or revoked sooner.    Influenza A by PCR NEGATIVE NEGATIVE Final   Influenza B by PCR NEGATIVE NEGATIVE Final    Comment: (NOTE) The Xpert Xpress SARS-CoV-2/FLU/RSV assay is intended as an aid in  the diagnosis of influenza from Nasopharyngeal swab specimens and  should not be used as a sole basis for treatment. Nasal washings and  aspirates are unacceptable for Xpert Xpress SARS-CoV-2/FLU/RSV  testing. Fact Sheet for Patients: https://www.moore.com/ Fact Sheet for Healthcare Providers: https://www.young.biz/ This test is not yet approved or cleared by the [place] FDA and  has been authorized for detection and/or diagnosis of SARS-CoV-2 by  FDA under an Emergency Use Authorization (EUA). This EUA will remain  in effect (meaning this test can be used) for the duration of the  Covid-19 declaration under Section 564(b)(1) of the Act, 21  U.S.C. section 360bbb-3(b)(1), unless the authorization is  terminated or revoked. Performed at [facility], [address] [place], [place] [address]   Respiratory Panel by PCR     Status: None   Collection Time: 11/20/19  5:23 PM   Specimen: Nasopharyngeal Swab; Respiratory  Result Value Ref Range Status   Adenovirus NOT DETECTED NOT DETECTED Final   Coronavirus 229E NOT DETECTED NOT DETECTED Final    Comment: (NOTE) The Coronavirus on the Respiratory Panel, DOES NOT test for the novel  Coronavirus (2019 nCoV)    Coronavirus HKU1 NOT DETECTED NOT DETECTED Final   Coronavirus NL63 NOT DETECTED NOT DETECTED Final   Coronavirus OC43 NOT DETECTED NOT DETECTED Final   Metapneumovirus NOT DETECTED NOT DETECTED Final   Rhinovirus / Enterovirus NOT DETECTED NOT DETECTED Final   Influenza A NOT DETECTED NOT DETECTED Final    Influenza B NOT DETECTED NOT DETECTED Final   Parainfluenza Virus 1 NOT DETECTED NOT DETECTED Final   Parainfluenza Virus 2 NOT DETECTED NOT DETECTED Final   Parainfluenza Virus 3 NOT DETECTED NOT DETECTED Final   Parainfluenza Virus 4 NOT DETECTED NOT DETECTED Final   Respiratory Syncytial Virus NOT DETECTED NOT DETECTED Final   Bordetella pertussis NOT DETECTED NOT DETECTED Final   Chlamydophila pneumoniae NOT DETECTED NOT DETECTED Final   Mycoplasma pneumoniae NOT DETECTED NOT DETECTED Final    Comment: Performed at [facility], [address], [place], [place] [address]     Time coordinating discharge: Over 30 minutes  SIGNED:   Tresa Moore, MD  [facility] 11/23/2019, 1:51 PM Pager [phone]  If 7PM-7AM, please contact night-coverage [email or web address] Password [email or web address]

## 2019-11-23 NOTE — Care Management Important Message (Signed)
Important Message  Patient Details  Name: KATARYNA MCQUILKIN MRN: [id number] Date of Birth: 11/16/1956   Medicare Important Message Given:  Other (see comment)  Attempted to review Medicare IM with patient via room phone, though no answer.     Sabeen Piechocki 11/23/2019, 12:12 PM

## 2019-11-25 LAB — CULTURE, BLOOD (ROUTINE X 2)
Culture: NO GROWTH
Culture: NO GROWTH
Special Requests: ADEQUATE
Special Requests: ADEQUATE

## 2019-12-13 ENCOUNTER — Ambulatory Visit: Payer: Medicare Other

## 2019-12-19 ENCOUNTER — Ambulatory Visit: Payer: Medicare Other | Admitting: Physical Therapy

## 2019-12-21 ENCOUNTER — Ambulatory Visit: Payer: Medicare Other

## 2019-12-26 ENCOUNTER — Ambulatory Visit: Payer: Medicare Other

## 2019-12-28 ENCOUNTER — Ambulatory Visit: Payer: Medicare Other

## 2020-01-02 ENCOUNTER — Ambulatory Visit: Payer: Medicare Other

## 2020-01-09 ENCOUNTER — Ambulatory Visit: Payer: Medicare Other | Admitting: Physical Therapy

## 2020-01-11 ENCOUNTER — Ambulatory Visit: Payer: Medicare Other | Admitting: Physical Therapy

## 2020-01-16 ENCOUNTER — Ambulatory Visit: Payer: Medicare Other | Admitting: Physical Therapy

## 2020-04-14 ENCOUNTER — Emergency Department: Payer: Medicare Other

## 2020-04-14 ENCOUNTER — Other Ambulatory Visit: Payer: Self-pay

## 2020-04-14 ENCOUNTER — Encounter: Payer: Self-pay | Admitting: Emergency Medicine

## 2020-04-14 ENCOUNTER — Emergency Department
Admission: EM | Admit: 2020-04-14 | Discharge: 2020-04-14 | Disposition: A | Discharge status: Home / Self Care | Payer: Medicare Other | Attending: Emergency Medicine | Admitting: Emergency Medicine

## 2020-04-14 DIAGNOSIS — M545 Low back pain: Secondary | ICD-10-CM | POA: Diagnosis present

## 2020-04-14 DIAGNOSIS — Z79899 Other long term (current) drug therapy: Secondary | ICD-10-CM | POA: Insufficient documentation

## 2020-04-14 DIAGNOSIS — X58XXXA Exposure to other specified factors, initial encounter: Secondary | ICD-10-CM | POA: Diagnosis not present

## 2020-04-14 DIAGNOSIS — Y929 Unspecified place or not applicable: Secondary | ICD-10-CM | POA: Insufficient documentation

## 2020-04-14 DIAGNOSIS — E039 Hypothyroidism, unspecified: Secondary | ICD-10-CM | POA: Insufficient documentation

## 2020-04-14 DIAGNOSIS — Y939 Activity, unspecified: Secondary | ICD-10-CM | POA: Diagnosis not present

## 2020-04-14 DIAGNOSIS — Y999 Unspecified external cause status: Secondary | ICD-10-CM | POA: Diagnosis not present

## 2020-04-14 DIAGNOSIS — I509 Heart failure, unspecified: Secondary | ICD-10-CM | POA: Insufficient documentation

## 2020-04-14 DIAGNOSIS — S39012A Strain of muscle, fascia and tendon of lower back, initial encounter: Secondary | ICD-10-CM | POA: Insufficient documentation

## 2020-04-14 DIAGNOSIS — D649 Anemia, unspecified: Secondary | ICD-10-CM | POA: Diagnosis not present

## 2020-04-14 DIAGNOSIS — I110 Hypertensive heart disease with heart failure: Secondary | ICD-10-CM | POA: Diagnosis not present

## 2020-04-14 DIAGNOSIS — T148XXA Other injury of unspecified body region, initial encounter: Secondary | ICD-10-CM

## 2020-04-14 DIAGNOSIS — K7460 Unspecified cirrhosis of liver: Secondary | ICD-10-CM

## 2020-04-14 LAB — URINALYSIS, COMPLETE (UACMP) WITH MICROSCOPIC
Bilirubin Urine: NEGATIVE
Glucose, UA: NEGATIVE mg/dL
Hgb urine dipstick: NEGATIVE
Ketones, ur: NEGATIVE mg/dL
Leukocytes,Ua: NEGATIVE
Nitrite: NEGATIVE
Protein, ur: NEGATIVE mg/dL
Specific Gravity, Urine: 1.017 (ref 1.005–1.030)
WBC, UA: NONE SEEN WBC/hpf (ref 0–5)
pH: 6 (ref 5.0–8.0)

## 2020-04-14 LAB — CBC
HCT: 26.6 % — ABNORMAL LOW (ref 36.0–46.0)
Hemoglobin: 8.6 g/dL — ABNORMAL LOW (ref 12.0–15.0)
MCH: 29.7 pg (ref 26.0–34.0)
MCHC: 32.3 g/dL (ref 30.0–36.0)
MCV: 91.7 fL (ref 80.0–100.0)
Platelets: 105 10*3/uL — ABNORMAL LOW (ref 150–400)
RBC: 2.9 MIL/uL — ABNORMAL LOW (ref 3.87–5.11)
RDW: 21.1 % — ABNORMAL HIGH (ref 11.5–15.5)
WBC: 3.1 10*3/uL — ABNORMAL LOW (ref 4.0–10.5)
nRBC: 0 % (ref 0.0–0.2)

## 2020-04-14 LAB — COMPREHENSIVE METABOLIC PANEL
ALT: 46 U/L — ABNORMAL HIGH (ref 0–44)
AST: 183 U/L — ABNORMAL HIGH (ref 15–41)
Albumin: 4.1 g/dL (ref 3.5–5.0)
Alkaline Phosphatase: 123 U/L (ref 38–126)
Anion gap: 11 (ref 5–15)
BUN: 18 mg/dL (ref 8–23)
CO2: 24 mmol/L (ref 22–32)
Calcium: 8.7 mg/dL — ABNORMAL LOW (ref 8.9–10.3)
Chloride: 102 mmol/L (ref 98–111)
Creatinine, Ser: 1.35 mg/dL — ABNORMAL HIGH (ref 0.44–1.00)
GFR calc Af Amer: 48 mL/min — ABNORMAL LOW (ref >60)
GFR calc non Af Amer: 42 mL/min — ABNORMAL LOW (ref >60)
Glucose, Bld: 97 mg/dL (ref 70–99)
Potassium: 3.8 mmol/L (ref 3.5–5.1)
Sodium: 137 mmol/L (ref 135–145)
Total Bilirubin: 1.8 mg/dL — ABNORMAL HIGH (ref 0.3–1.2)
Total Protein: 8.5 g/dL — ABNORMAL HIGH (ref 6.5–8.1)

## 2020-04-14 LAB — LIPASE, BLOOD: Lipase: 26 U/L (ref 11–51)

## 2020-04-14 MED ORDER — BACLOFEN 5 MG PO TABS: 5.0000 mg | ORAL_TABLET | Freq: Three times a day (TID) | ORAL | 0 refills | Status: DC | PRN

## 2020-04-14 MED ORDER — IOHEXOL 300 MG/ML  SOLN
100.0000 mL | Freq: Once | INTRAMUSCULAR | Status: AC | PRN
  Administered 2020-04-14: 100 mL via INTRAVENOUS
  Filled 2020-04-14: qty 100

## 2020-04-14 MED ORDER — LIDOCAINE 5 % EX PTCH: 1.0000 | MEDICATED_PATCH | CUTANEOUS | 0 refills | Status: DC

## 2020-04-14 NOTE — ED Notes (Signed)
First Nurse Note: Pt to ED via [facility] from home for lower back pain. Pt also has cough and change in urine color. Pt is in NAD.

## 2020-04-14 NOTE — Discharge Instructions (Addendum)
Please call primary care tomorrow morning for an appointment in 48 hours.

## 2020-04-14 NOTE — ED Provider Notes (Signed)
[facility] Emergency Department Provider Note  ____________________________________________  Time seen: Approximately 10:54 AM  I have reviewed the triage vital signs and the nursing notes.   HISTORY  Chief Complaint Back Pain    HPI Kathryn [name] is a 64 y.o. female that presents to the emergency department for evaluation of left-sided mid to low left back pain for 2 days. Pain radiates into her left side. Patient states that both of her legs feel sore.  Urine is sometimes darker than usual.  Patient also woke up this morning with a cough.  She had pneumonia in December and hopes that this has not returned.  No bowel or bladder dysfunction or saddle anesthesias. No recent illness. No fevers, shortness of breath, chest pain, nausea, vomiting, abdominal pain, urinary symptoms.  Past Medical History:  Diagnosis Date  . Alcoholic cirrhosis of liver (HCC)   . Asthma   . CHF (congestive heart failure) (HCC)   . Chronic disease anemia   . Esophageal varices (HCC)    GR I on EGD by Dr Shelle Iron 09/2014  . ETOH abuse   . Hypertension   . Hypothyroidism   . Murmur   . Portal hypertension (HCC)   . Sarcoidosis     Patient Active Problem List   Diagnosis Date Noted  . Community acquired pneumonia 11/20/2019  . Pneumonia due to COVID-19 virus 11/20/2019  . Anemia 06/12/2016  . Hypoglycemia 02/11/2016  . Hematemesis 04/04/2015  . Cirrhosis (HCC) 04/04/2015  . Esophageal varices (HCC) 04/04/2015  . Hypothyroid 04/04/2015  . Hypertension 04/04/2015  . Chest pain 04/04/2015    Past Surgical History:  Procedure Laterality Date  . ESOPHAGOGASTRODUODENOSCOPY (EGD) WITH PROPOFOL N/A 04/04/2015   Procedure: ESOPHAGOGASTRODUODENOSCOPY (EGD) WITH PROPOFOL;  Surgeon: Midge Minium, MD;  Location: [facility] ENDOSCOPY;  Service: Endoscopy;  Laterality: N/A;  Dr Servando Snare prefers [phone]  . ESOPHAGOGASTRODUODENOSCOPY W/ BANDING  09/2014   [place]  . TUBAL LIGATION      Prior to Admission  medications   Medication Sig Start Date End Date Taking? Authorizing Provider  Baclofen 5 MG TABS Take 5 mg by mouth 3 (three) times daily as needed. 04/14/20   Enid Derry, PA-C  levothyroxine (SYNTHROID) 175 MCG tablet Take 1 tablet (175 mcg total) by mouth daily before breakfast. 11/23/19 12/23/19  Lolita Patella B, MD  lidocaine (LIDODERM) 5 % Place 1 patch onto the skin daily. Remove & Discard patch within 12 hours or as directed by MD 04/14/20   Enid Derry, PA-C  nadolol (CORGARD) 20 MG tablet Take 1 tablet (20 mg total) by mouth daily. 11/23/19 12/23/19  Tresa Moore, MD  spironolactone (ALDACTONE) 25 MG tablet Take 1 tablet (25 mg total) by mouth daily. 11/23/19 12/23/19  Tresa Moore, MD    Allergies Hydrocodone-acetaminophen, Ibuprofen, Naproxen, Propoxyphene, Shellfish allergy, and Tamiflu  [oseltamivir phosphate]  Family History  Problem Relation Age of Onset  . Aneurysm Father   . COPD Father   . Heart attack Mother     Social History Social History   Tobacco Use  . Smoking status: Never Smoker  . Smokeless tobacco: Never Used  Substance Use Topics  . Alcohol use: Yes    Alcohol/week: 15.0 standard drinks    Types: 15 Shots of liquor per week    Comment: Quit ETOH- 04/04/15, Heavy Use prior to this  . Drug use: No     Review of Systems  Constitutional: No fever/chills ENT: No upper respiratory complaints. Cardiovascular: No chest  pain. Respiratory: Positive for cough today.  No SOB. Gastrointestinal: No abdominal pain.  No nausea, no vomiting.  Genitourinary: Negative for dysuria. Musculoskeletal: Positive for back pain. Skin: Negative for rash, abrasions, lacerations, ecchymosis. Neurological: Negative for headaches, numbness or tingling   ____________________________________________   PHYSICAL EXAM:  VITAL SIGNS: ED Triage Vitals  Enc Vitals Group     BP 04/14/20 0916 (!) 147/73     Pulse Rate 04/14/20 0916 87     Resp 04/14/20  0916 16     Temp 04/14/20 0916 98.5 F (36.9 C)     Temp Source 04/14/20 0916 Oral     SpO2 04/14/20 0916 100 %     Weight 04/14/20 0917 185 lb (83.9 kg)     Height 04/14/20 0917 5\' 4"  (1.626 m)     Head Circumference --      Peak Flow --      Pain Score 04/14/20 0917 7     Pain Loc --      Pain Edu? --      Excl. in [place]? --      Constitutional: Alert and oriented. Well appearing and in no acute distress. Eyes: Conjunctivae are normal. PERRL. EOMI. Head: Atraumatic. ENT:      Ears:      Nose: No congestion/rhinnorhea.      Mouth/Throat: Mucous membranes are moist.  Neck: No stridor.  Cardiovascular: Normal rate, regular rhythm.  Good peripheral circulation. Respiratory: Normal respiratory effort without tachypnea or retractions. Lungs CTAB. Good air entry to the bases with no decreased or absent breath sounds. Gastrointestinal: Soft and nontender to palpation. No guarding or rigidity. No palpable masses. No distention. Musculoskeletal: Full range of motion to all extremities. No gross deformities appreciated.  Mild tenderness to palpation to left lumbar paraspinal muscles.  No tenderness palpation to lumbar spine.  Strength equal in lower extremities bilaterally.  Normal but slow gait. Neurologic:  Normal speech and language. No gross focal neurologic deficits are appreciated.  Skin:  Skin is warm, dry and intact. No rash noted. Psychiatric: Mood and affect are normal. Speech and behavior are normal. Patient exhibits appropriate insight and judgement.   ____________________________________________   LABS (all labs ordered are listed, but only abnormal results are displayed)  Labs Reviewed  URINALYSIS, COMPLETE (UACMP) WITH MICROSCOPIC - Abnormal; Notable for the following components:      Result Value   Color, Urine YELLOW (*)    APPearance CLEAR (*)    Bacteria, UA RARE (*)    All other components within normal limits  CBC - Abnormal; Notable for the following components:    WBC 3.1 (*)    RBC 2.90 (*)    Hemoglobin 8.6 (*)    HCT 26.6 (*)    RDW 21.1 (*)    Platelets 105 (*)    All other components within normal limits  COMPREHENSIVE METABOLIC PANEL - Abnormal; Notable for the following components:   Creatinine, Ser 1.35 (*)    Calcium 8.7 (*)    Total Protein 8.5 (*)    AST 183 (*)    ALT 46 (*)    Total Bilirubin 1.8 (*)    GFR calc non Af Amer 42 (*)    GFR calc Af Amer 48 (*)    All other components within normal limits  URINE CULTURE  LIPASE, BLOOD   ____________________________________________  EKG   ____________________________________________  RADIOLOGY Robinette Haines, personally viewed and evaluated these images (plain radiographs) as part of my  medical decision making, as well as reviewing the written report by the radiologist.  DG Chest 2 View  Result Date: 04/14/2020 CLINICAL DATA:  Lower left-sided back pain. EXAM: CHEST - 2 VIEW COMPARISON:  November 20, 2019 FINDINGS: Cardiomediastinal silhouette is normal. Mediastinal contours appear intact. There is no evidence of focal airspace consolidation, pleural effusion or pneumothorax. Osseous structures are without acute abnormality. Soft tissues are grossly normal. IMPRESSION: 1. No active cardiopulmonary disease. Electronically Signed   By: Ted Mcalpine M.D.   On: 04/14/2020 11:57   DG Lumbar Spine 2-3 Views  Result Date: 04/14/2020 CLINICAL DATA:  Cough. EXAM: LUMBAR SPINE - 2-3 VIEW COMPARISON:  October 26, 2019 FINDINGS: There is no evidence of lumbar spine fracture. Alignment is normal. Mild osteoarthritic changes at L5-S1. Heavy atherosclerotic disease of the aorta. IMPRESSION: 1. Mild osteoarthritic changes at L5-S1. 2. Heavy atherosclerotic disease of the aorta. Electronically Signed   By: Ted Mcalpine M.D.   On: 04/14/2020 12:00   CT ABDOMEN PELVIS W CONTRAST  Result Date: 04/14/2020 CLINICAL DATA:  Mid back pain, change in urine color EXAM: CT ABDOMEN AND  PELVIS WITH CONTRAST TECHNIQUE: Multidetector CT imaging of the abdomen and pelvis was performed using the standard protocol following bolus administration of intravenous contrast. CONTRAST:  OMNIPAQUE IOHEXOL 300 MG/ML  SOLN COMPARISON:  09/25/2014 FINDINGS: Lower chest: No acute abnormality. Small hiatal hernia, likely with gastroesophageal varices. Hepatobiliary: Hepatic steatosis. Cirrhotic morphology of the liver. Small gallstones. No gallbladder wall thickening, or biliary dilatation. Pancreas: Unremarkable. No pancreatic ductal dilatation or surrounding inflammatory changes. Spleen: Normal in size without significant abnormality. Adrenals/Urinary Tract: Adrenal glands are unremarkable. Somewhat lobular, atrophic kidneys. Kidneys are otherwise normal, without renal calculi, solid lesion, or hydronephrosis. Bladder is unremarkable. Stomach/Bowel: Stomach is within normal limits. Appendix appears normal. No evidence of bowel wall thickening, distention, or inflammatory changes. Vascular/Lymphatic: Aortic atherosclerosis. No enlarged abdominal or pelvic lymph nodes. Reproductive: No mass or other significant abnormality. Other: No abdominal wall hernia or abnormality. No abdominopelvic ascites. Fat stranding about the celiac axis, unchanged compared to prior examinations. Musculoskeletal: No acute or significant osseous findings. Unchanged, unusual sclerotic appearance of the L5 vertebral body, possibly related to Paget's disease. IMPRESSION: 1. Hepatic steatosis and cirrhosis.  The spleen is normal in size. 2. Cholelithiasis without evidence of acute cholecystitis. 3. Small hiatal hernia, likely with gastroesophageal varices. 4. Fat stranding about the celiac axis, unchanged compared to prior examinations, nonspecific, potentially related to prior infection or inflammation. 5. Aortic Atherosclerosis (ICD10-I70.0). Electronically Signed   By: Lauralyn Primes M.D.   On: 04/14/2020 13:50     ____________________________________________    PROCEDURES  Procedure(s) performed:    Procedures    Medications  iohexol (OMNIPAQUE) 300 MG/ML solution 100 mL (100 mLs Intravenous Contrast Given 04/14/20 1307)     ____________________________________________   INITIAL IMPRESSION / ASSESSMENT AND PLAN / ED COURSE  Pertinent labs & imaging results that were available during my care of the patient were reviewed by me and considered in my medical decision making (see chart for details).  Review of the [place] CSRS was performed in accordance of the [facility] prior to dispensing any controlled drugs.  Patient presented to emergency department for evaluation of low back pain.  Vital signs and exam are reassuring.  Chest x-ray negative for acute cardiopulmonary processes.  Hemoglobin is 8.6.  Patient has a history of chronic anemia from cirrhosis.  She has had similar levels in the past.  Hemoccult is negative.  Patient denies any symptoms of GI bleed.  Patient states that she has had anemia "her whole life." Kidney and liver function is similar to her previous.  She does have some rare bacteria on urinalysis but no nitrites, leukocytes, white blood cells.  Patient denies urinary symptoms.  Urine will be sent for culture. CT scan is negative for acute abnormalities.  Patient denies any shortness of breath, chest pain, nausea, vomiting, abdominal pain.  Back pain is likely musculoskeletal in nature.  Patient will be discharged home with prescriptions for baclofen and Lidoderm. Patient is to follow up with primary care as directed. Patient is given ED precautions to return to the ED for any worsening or new symptoms.  Kathryn [name] was evaluated in Emergency Department on 04/14/2020 for the symptoms described in the history of present illness. She was evaluated in the context of the global COVID-19 pandemic, which necessitated consideration that the patient might be at risk for infection with the  SARS-CoV-2 virus that causes COVID-19. Institutional protocols and algorithms that pertain to the evaluation of patients at risk for COVID-19 are in a state of rapid change based on information released by regulatory bodies including the CDC and federal and state organizations. These policies and algorithms were followed during the patient's care in the ED.   ____________________________________________  FINAL CLINICAL IMPRESSION(S) / ED DIAGNOSES  Final diagnoses:  Muscle strain  Anemia, unspecified type  Cirrhosis of liver without ascites, unspecified hepatic cirrhosis type (HCC)      NEW MEDICATIONS STARTED DURING THIS VISIT:  ED Discharge Orders         Ordered    Baclofen 5 MG TABS  3 times daily PRN     04/14/20 1452    lidocaine (LIDODERM) 5 %  Every 24 hours     04/14/20 1452              This chart was dictated using voice recognition software/Dragon. Despite best efforts to proofread, errors can occur which can change the meaning. Any change was purely unintentional.    Enid Derry, PA-C 04/14/20 1621    Minna Antis, MD 04/15/20 2131

## 2020-04-14 NOTE — ED Triage Notes (Signed)
Pt to ED via POV c/o left lower back pain. Pt states that the pain radiates into the middle. Pt also states that her urine is also darker than normal. Pt denies in other symptoms. Pt is in NAD.

## 2020-04-16 LAB — URINE CULTURE

## 2021-06-24 ENCOUNTER — Encounter: Payer: Self-pay | Admitting: *Deleted

## 2021-06-24 ENCOUNTER — Inpatient Hospital Stay
Admission: EM | Admit: 2021-06-24 | Discharge: 2021-06-27 | DRG: 432 | Disposition: A | Discharge status: Home / Self Care | Payer: Medicare Other | Attending: Internal Medicine | Admitting: Internal Medicine

## 2021-06-24 ENCOUNTER — Emergency Department: Payer: Medicare Other

## 2021-06-24 ENCOUNTER — Other Ambulatory Visit: Payer: Self-pay

## 2021-06-24 DIAGNOSIS — R001 Bradycardia, unspecified: Secondary | ICD-10-CM | POA: Diagnosis not present

## 2021-06-24 DIAGNOSIS — K7460 Unspecified cirrhosis of liver: Secondary | ICD-10-CM | POA: Diagnosis present

## 2021-06-24 DIAGNOSIS — D6959 Other secondary thrombocytopenia: Secondary | ICD-10-CM | POA: Diagnosis present

## 2021-06-24 DIAGNOSIS — I5032 Chronic diastolic (congestive) heart failure: Secondary | ICD-10-CM | POA: Diagnosis present

## 2021-06-24 DIAGNOSIS — D869 Sarcoidosis, unspecified: Secondary | ICD-10-CM | POA: Diagnosis present

## 2021-06-24 DIAGNOSIS — D61818 Other pancytopenia: Secondary | ICD-10-CM | POA: Diagnosis present

## 2021-06-24 DIAGNOSIS — Z20822 Contact with and (suspected) exposure to covid-19: Secondary | ICD-10-CM | POA: Diagnosis present

## 2021-06-24 DIAGNOSIS — Z886 Allergy status to analgesic agent status: Secondary | ICD-10-CM

## 2021-06-24 DIAGNOSIS — D649 Anemia, unspecified: Secondary | ICD-10-CM | POA: Diagnosis present

## 2021-06-24 DIAGNOSIS — R079 Chest pain, unspecified: Secondary | ICD-10-CM

## 2021-06-24 DIAGNOSIS — D638 Anemia in other chronic diseases classified elsewhere: Secondary | ICD-10-CM | POA: Diagnosis present

## 2021-06-24 DIAGNOSIS — Z79890 Hormone replacement therapy (postmenopausal): Secondary | ICD-10-CM

## 2021-06-24 DIAGNOSIS — Z8701 Personal history of pneumonia (recurrent): Secondary | ICD-10-CM

## 2021-06-24 DIAGNOSIS — I248 Other forms of acute ischemic heart disease: Secondary | ICD-10-CM | POA: Diagnosis present

## 2021-06-24 DIAGNOSIS — J45909 Unspecified asthma, uncomplicated: Secondary | ICD-10-CM | POA: Diagnosis present

## 2021-06-24 DIAGNOSIS — N183 Chronic kidney disease, stage 3 unspecified: Secondary | ICD-10-CM | POA: Diagnosis present

## 2021-06-24 DIAGNOSIS — N1831 Chronic kidney disease, stage 3a: Secondary | ICD-10-CM | POA: Diagnosis present

## 2021-06-24 DIAGNOSIS — Z79899 Other long term (current) drug therapy: Secondary | ICD-10-CM

## 2021-06-24 DIAGNOSIS — Z888 Allergy status to other drugs, medicaments and biological substances status: Secondary | ICD-10-CM

## 2021-06-24 DIAGNOSIS — F1010 Alcohol abuse, uncomplicated: Secondary | ICD-10-CM | POA: Diagnosis present

## 2021-06-24 DIAGNOSIS — T500X6A Underdosing of mineralocorticoids and their antagonists, initial encounter: Secondary | ICD-10-CM | POA: Diagnosis present

## 2021-06-24 DIAGNOSIS — Z91138 Patient's unintentional underdosing of medication regimen for other reason: Secondary | ICD-10-CM

## 2021-06-24 DIAGNOSIS — E039 Hypothyroidism, unspecified: Secondary | ICD-10-CM | POA: Diagnosis present

## 2021-06-24 DIAGNOSIS — K3189 Other diseases of stomach and duodenum: Secondary | ICD-10-CM | POA: Diagnosis present

## 2021-06-24 DIAGNOSIS — D500 Iron deficiency anemia secondary to blood loss (chronic): Secondary | ICD-10-CM | POA: Diagnosis present

## 2021-06-24 DIAGNOSIS — K7030 Alcoholic cirrhosis of liver without ascites: Principal | ICD-10-CM | POA: Diagnosis present

## 2021-06-24 DIAGNOSIS — I10 Essential (primary) hypertension: Secondary | ICD-10-CM | POA: Diagnosis present

## 2021-06-24 DIAGNOSIS — I160 Hypertensive urgency: Secondary | ICD-10-CM | POA: Diagnosis present

## 2021-06-24 DIAGNOSIS — Z8249 Family history of ischemic heart disease and other diseases of the circulatory system: Secondary | ICD-10-CM

## 2021-06-24 DIAGNOSIS — D72819 Decreased white blood cell count, unspecified: Secondary | ICD-10-CM | POA: Diagnosis present

## 2021-06-24 DIAGNOSIS — T447X6A Underdosing of beta-adrenoreceptor antagonists, initial encounter: Secondary | ICD-10-CM | POA: Diagnosis present

## 2021-06-24 DIAGNOSIS — I8511 Secondary esophageal varices with bleeding: Secondary | ICD-10-CM | POA: Diagnosis present

## 2021-06-24 DIAGNOSIS — I130 Hypertensive heart and chronic kidney disease with heart failure and stage 1 through stage 4 chronic kidney disease, or unspecified chronic kidney disease: Secondary | ICD-10-CM | POA: Diagnosis present

## 2021-06-24 DIAGNOSIS — K766 Portal hypertension: Secondary | ICD-10-CM | POA: Diagnosis present

## 2021-06-24 DIAGNOSIS — Z885 Allergy status to narcotic agent status: Secondary | ICD-10-CM

## 2021-06-24 DIAGNOSIS — I509 Heart failure, unspecified: Secondary | ICD-10-CM | POA: Diagnosis not present

## 2021-06-24 DIAGNOSIS — M545 Low back pain, unspecified: Secondary | ICD-10-CM | POA: Diagnosis present

## 2021-06-24 DIAGNOSIS — Z825 Family history of asthma and other chronic lower respiratory diseases: Secondary | ICD-10-CM

## 2021-06-24 LAB — RETICULOCYTES
Immature Retic Fract: 25.6 % — ABNORMAL HIGH (ref 2.3–15.9)
RBC.: 3.47 MIL/uL — ABNORMAL LOW (ref 3.87–5.11)
Retic Count, Absolute: 35 10*3/uL (ref 19.0–186.0)
Retic Ct Pct: 1 % (ref 0.4–3.1)

## 2021-06-24 LAB — URINALYSIS, COMPLETE (UACMP) WITH MICROSCOPIC
Bacteria, UA: NONE SEEN
Bilirubin Urine: NEGATIVE
Glucose, UA: NEGATIVE mg/dL
Hgb urine dipstick: NEGATIVE
Ketones, ur: NEGATIVE mg/dL
Leukocytes,Ua: NEGATIVE
Nitrite: NEGATIVE
Protein, ur: NEGATIVE mg/dL
Specific Gravity, Urine: 1.004 — ABNORMAL LOW (ref 1.005–1.030)
Squamous Epithelial / HPF: NONE SEEN (ref 0–5)
pH: 8 (ref 5.0–8.0)

## 2021-06-24 LAB — CBC
HCT: 24.2 % — ABNORMAL LOW (ref 36.0–46.0)
HCT: 25.4 % — ABNORMAL LOW (ref 36.0–46.0)
HCT: 29 % — ABNORMAL LOW (ref 36.0–46.0)
HCT: 25.6 % — ABNORMAL LOW (ref 36.0–46.0)
Hemoglobin: 7.2 g/dL — ABNORMAL LOW (ref 12.0–15.0)
Hemoglobin: 7.4 g/dL — ABNORMAL LOW (ref 12.0–15.0)
Hemoglobin: 8.8 g/dL — ABNORMAL LOW (ref 12.0–15.0)
Hemoglobin: 8 g/dL — ABNORMAL LOW (ref 12.0–15.0)
MCH: 21.4 pg — ABNORMAL LOW (ref 26.0–34.0)
MCH: 21.1 pg — ABNORMAL LOW (ref 26.0–34.0)
MCH: 22.1 pg — ABNORMAL LOW (ref 26.0–34.0)
MCH: 22.3 pg — ABNORMAL LOW (ref 26.0–34.0)
MCHC: 29.8 g/dL — ABNORMAL LOW (ref 30.0–36.0)
MCHC: 29.1 g/dL — ABNORMAL LOW (ref 30.0–36.0)
MCHC: 30.3 g/dL (ref 30.0–36.0)
MCHC: 31.3 g/dL (ref 30.0–36.0)
MCV: 71.8 fL — ABNORMAL LOW (ref 80.0–100.0)
MCV: 72.6 fL — ABNORMAL LOW (ref 80.0–100.0)
MCV: 72.7 fL — ABNORMAL LOW (ref 80.0–100.0)
MCV: 71.5 fL — ABNORMAL LOW (ref 80.0–100.0)
Platelets: 128 10*3/uL — ABNORMAL LOW (ref 150–400)
Platelets: 118 10*3/uL — ABNORMAL LOW (ref 150–400)
Platelets: 113 10*3/uL — ABNORMAL LOW (ref 150–400)
Platelets: 124 10*3/uL — ABNORMAL LOW (ref 150–400)
RBC: 3.37 MIL/uL — ABNORMAL LOW (ref 3.87–5.11)
RBC: 3.5 MIL/uL — ABNORMAL LOW (ref 3.87–5.11)
RBC: 3.99 MIL/uL (ref 3.87–5.11)
RBC: 3.58 MIL/uL — ABNORMAL LOW (ref 3.87–5.11)
RDW: 23.9 % — ABNORMAL HIGH (ref 11.5–15.5)
RDW: 24.3 % — ABNORMAL HIGH (ref 11.5–15.5)
RDW: 22.8 % — ABNORMAL HIGH (ref 11.5–15.5)
RDW: 22.8 % — ABNORMAL HIGH (ref 11.5–15.5)
WBC: 3 10*3/uL — ABNORMAL LOW (ref 4.0–10.5)
WBC: 3.5 10*3/uL — ABNORMAL LOW (ref 4.0–10.5)
WBC: 3.7 10*3/uL — ABNORMAL LOW (ref 4.0–10.5)
WBC: 5.1 10*3/uL (ref 4.0–10.5)
nRBC: 0 % (ref 0.0–0.2)
nRBC: 0 % (ref 0.0–0.2)
nRBC: 0 % (ref 0.0–0.2)
nRBC: 0 % (ref 0.0–0.2)

## 2021-06-24 LAB — TROPONIN I (HIGH SENSITIVITY)
Troponin I (High Sensitivity): 11 ng/L (ref <18)
Troponin I (High Sensitivity): 11 ng/L (ref <18)
Troponin I (High Sensitivity): 11 ng/L (ref <18)

## 2021-06-24 LAB — COMPREHENSIVE METABOLIC PANEL
ALT: 22 U/L (ref 0–44)
AST: 99 U/L — ABNORMAL HIGH (ref 15–41)
Albumin: 3.7 g/dL (ref 3.5–5.0)
Alkaline Phosphatase: 67 U/L (ref 38–126)
Anion gap: 7 (ref 5–15)
BUN: 14 mg/dL (ref 8–23)
CO2: 21 mmol/L — ABNORMAL LOW (ref 22–32)
Calcium: 8.3 mg/dL — ABNORMAL LOW (ref 8.9–10.3)
Chloride: 106 mmol/L (ref 98–111)
Creatinine, Ser: 1.23 mg/dL — ABNORMAL HIGH (ref 0.44–1.00)
GFR, Estimated: 49 mL/min — ABNORMAL LOW (ref >60)
Glucose, Bld: 91 mg/dL (ref 70–99)
Potassium: 3.8 mmol/L (ref 3.5–5.1)
Sodium: 134 mmol/L — ABNORMAL LOW (ref 135–145)
Total Bilirubin: 1.1 mg/dL (ref 0.3–1.2)
Total Protein: 8.2 g/dL — ABNORMAL HIGH (ref 6.5–8.1)

## 2021-06-24 LAB — FOLATE: Folate: 5 ng/mL — ABNORMAL LOW (ref >5.9)

## 2021-06-24 LAB — APTT: aPTT: 33 seconds (ref 24–36)

## 2021-06-24 LAB — PREPARE RBC (CROSSMATCH)

## 2021-06-24 LAB — BRAIN NATRIURETIC PEPTIDE: B Natriuretic Peptide: 157.1 pg/mL — ABNORMAL HIGH (ref 0.0–100.0)

## 2021-06-24 LAB — PROTIME-INR
INR: 1.2 (ref 0.8–1.2)
Prothrombin Time: 15.2 seconds (ref 11.4–15.2)

## 2021-06-24 LAB — IRON AND TIBC
Iron: 34 ug/dL (ref 28–170)
Saturation Ratios: 7 % — ABNORMAL LOW (ref 10.4–31.8)
TIBC: 461 ug/dL — ABNORMAL HIGH (ref 250–450)
UIBC: 427 ug/dL

## 2021-06-24 LAB — FERRITIN: Ferritin: 8 ng/mL — ABNORMAL LOW (ref 11–307)

## 2021-06-24 LAB — VITAMIN B12: Vitamin B-12: 447 pg/mL (ref 180–914)

## 2021-06-24 LAB — RESP PANEL BY RT-PCR (FLU A&B, COVID) ARPGX2
Influenza A by PCR: NEGATIVE
Influenza B by PCR: NEGATIVE
SARS Coronavirus 2 by RT PCR: NEGATIVE

## 2021-06-24 MED ORDER — FERROUS SULFATE 325 (65 FE) MG PO TABS: 325.0000 mg | ORAL_TABLET | Freq: Every day | ORAL | Status: DC

## 2021-06-24 MED ORDER — LEVOTHYROXINE SODIUM 50 MCG PO TABS
175.0000 ug | ORAL_TABLET | Freq: Every day | ORAL | Status: DC
  Administered 2021-06-25 – 2021-06-27 (×2): 175 ug via ORAL
  Filled 2021-06-24 (×2): qty 1

## 2021-06-24 MED ORDER — LORAZEPAM 1 MG PO TABS
1.0000 mg | ORAL_TABLET | ORAL | Status: AC | PRN
  Administered 2021-06-26: 1 mg via ORAL
  Filled 2021-06-24: qty 1

## 2021-06-24 MED ORDER — SODIUM CHLORIDE 0.9 % IV SOLN
10.0000 mL/h | Freq: Once | INTRAVENOUS | Status: AC
  Administered 2021-06-24: 10 mL/h via INTRAVENOUS

## 2021-06-24 MED ORDER — FOLIC ACID 1 MG PO TABS
1.0000 mg | ORAL_TABLET | Freq: Every day | ORAL | Status: DC
  Administered 2021-06-25 – 2021-06-27 (×2): 1 mg via ORAL
  Filled 2021-06-24 (×2): qty 1

## 2021-06-24 MED ORDER — SODIUM CHLORIDE 0.9 % IV SOLN
510.0000 mg | Freq: Once | INTRAVENOUS | Status: AC
  Administered 2021-06-24: 510 mg via INTRAVENOUS
  Filled 2021-06-24: qty 17

## 2021-06-24 MED ORDER — ACETAMINOPHEN 325 MG PO TABS
650.0000 mg | ORAL_TABLET | Freq: Once | ORAL | Status: AC
  Administered 2021-06-24: 650 mg via ORAL
  Filled 2021-06-24: qty 2

## 2021-06-24 MED ORDER — HYDRALAZINE HCL 20 MG/ML IJ SOLN
5.0000 mg | INTRAMUSCULAR | Status: DC | PRN
  Administered 2021-06-24: 5 mg via INTRAVENOUS
  Filled 2021-06-24: qty 1

## 2021-06-24 MED ORDER — SPIRONOLACTONE 25 MG PO TABS
12.5000 mg | ORAL_TABLET | Freq: Every day | ORAL | Status: DC
  Administered 2021-06-24 – 2021-06-27 (×3): 12.5 mg via ORAL
  Filled 2021-06-24: qty 0.5
  Filled 2021-06-24: qty 1
  Filled 2021-06-24: qty 0.5
  Filled 2021-06-24: qty 1
  Filled 2021-06-24 (×2): qty 0.5
  Filled 2021-06-24: qty 1

## 2021-06-24 MED ORDER — THIAMINE HCL 100 MG/ML IJ SOLN
100.0000 mg | Freq: Every day | INTRAMUSCULAR | Status: DC
  Administered 2021-06-26: 100 mg via INTRAVENOUS
  Filled 2021-06-24 (×2): qty 2

## 2021-06-24 MED ORDER — LORAZEPAM 2 MG/ML IJ SOLN: 1.0000 mg | INTRAMUSCULAR | Status: AC | PRN

## 2021-06-24 MED ORDER — ADULT MULTIVITAMIN W/MINERALS CH
1.0000 | ORAL_TABLET | Freq: Every day | ORAL | Status: DC
  Administered 2021-06-25 – 2021-06-27 (×2): 1 via ORAL
  Filled 2021-06-24 (×2): qty 1

## 2021-06-24 MED ORDER — THIAMINE HCL 100 MG PO TABS
100.0000 mg | ORAL_TABLET | Freq: Every day | ORAL | Status: DC
  Administered 2021-06-24 – 2021-06-27 (×3): 100 mg via ORAL
  Filled 2021-06-24 (×4): qty 1

## 2021-06-24 MED ORDER — FERROUS SULFATE 325 (65 FE) MG PO TABS
325.0000 mg | ORAL_TABLET | ORAL | Status: DC
  Administered 2021-06-24: 325 mg via ORAL
  Filled 2021-06-24 (×2): qty 1

## 2021-06-24 MED ORDER — SENNOSIDES-DOCUSATE SODIUM 8.6-50 MG PO TABS: 1.0000 | ORAL_TABLET | Freq: Every evening | ORAL | Status: DC | PRN

## 2021-06-24 MED ORDER — NADOLOL 20 MG PO TABS
20.0000 mg | ORAL_TABLET | Freq: Every day | ORAL | Status: DC
  Administered 2021-06-24 – 2021-06-27 (×3): 20 mg via ORAL
  Filled 2021-06-24 (×4): qty 1

## 2021-06-24 MED ORDER — NICOTINE 21 MG/24HR TD PT24
21.0000 mg | MEDICATED_PATCH | Freq: Every day | TRANSDERMAL | Status: DC
  Filled 2021-06-24 (×2): qty 1

## 2021-06-24 MED ORDER — ALBUTEROL SULFATE (2.5 MG/3ML) 0.083% IN NEBU: 2.5000 mg | INHALATION_SOLUTION | RESPIRATORY_TRACT | Status: DC | PRN

## 2021-06-24 MED ORDER — LORAZEPAM 2 MG/ML IJ SOLN: 0.0000 mg | Freq: Four times a day (QID) | INTRAMUSCULAR | Status: AC

## 2021-06-24 MED ORDER — DM-GUAIFENESIN ER 30-600 MG PO TB12: 1.0000 | ORAL_TABLET | Freq: Two times a day (BID) | ORAL | Status: DC | PRN

## 2021-06-24 MED ORDER — ONDANSETRON HCL 4 MG/2ML IJ SOLN: 4.0000 mg | Freq: Three times a day (TID) | INTRAMUSCULAR | Status: DC | PRN

## 2021-06-24 MED ORDER — LORAZEPAM 2 MG/ML IJ SOLN: 0.0000 mg | Freq: Two times a day (BID) | INTRAMUSCULAR | Status: DC

## 2021-06-24 MED ORDER — SODIUM CHLORIDE 0.9 % IV SOLN: INTRAVENOUS | Status: DC

## 2021-06-24 MED ORDER — LIDOCAINE 5 % EX PTCH
1.0000 | MEDICATED_PATCH | CUTANEOUS | Status: DC
  Administered 2021-06-24 – 2021-06-26 (×2): 1 via TRANSDERMAL
  Filled 2021-06-24 (×3): qty 1

## 2021-06-24 MED ORDER — MORPHINE SULFATE (PF) 2 MG/ML IV SOLN: 1.0000 mg | INTRAVENOUS | Status: DC | PRN

## 2021-06-24 MED ORDER — AMLODIPINE BESYLATE 10 MG PO TABS
10.0000 mg | ORAL_TABLET | Freq: Every day | ORAL | Status: DC
  Administered 2021-06-24 – 2021-06-27 (×3): 10 mg via ORAL
  Filled 2021-06-24: qty 2
  Filled 2021-06-24: qty 1
  Filled 2021-06-24: qty 2

## 2021-06-24 MED ORDER — PANTOPRAZOLE SODIUM 40 MG IV SOLR
40.0000 mg | Freq: Two times a day (BID) | INTRAVENOUS | Status: DC
  Administered 2021-06-24 – 2021-06-27 (×7): 40 mg via INTRAVENOUS
  Filled 2021-06-24 (×7): qty 40

## 2021-06-24 NOTE — H&P (Addendum)
History and Physical    Kathryn [name] XBD:[id number] DOB: 1956-04-10 DOA: 06/24/2021  Referring MD/NP/PA:   PCP: Preston Fleeting, MD   Patient coming from:  The patient is coming from home.  At baseline, pt is independent for most of ADL.        Chief Complaint: Shortness of breath, generalized weakness  HPI: Kathryn [name] is a 65 y.o. female with medical history significant of alcohol abuse, alcoholic liver cirrhosis, portal vein hypertension, esophageal varices, hypertension, asthma, hypothyroidism, sarcoidosis, anemia, CHF (not 2D echo on record), CKD stage IIIa, who presents with shortness breath, generalized weakness.  Patient states that she has shortness breath and generalized weakness for more than 1 week.  She has dry cough, no fever or chills.  She also reports chest pain, which is located in central chest, mild, dull, non-pleuritic, not aggravated by deep breath.  Patient denies nausea, vomiting, diarrhea or abdominal pain.  No dark stool or rectal bleeding recently.  Patient complains of left lower back pain.  She continues to drink alcohol.  Last drinking was Sunday.  Patient states that she ran out of her medications for 1 more week  ED Course: pt was found to have pancytopenia (WBC 3.0, hemoglobin 7.2, platelet 128, her hemoglobin 8.6 on 04/14/2020), troponin level 11, pending COVID-19 PCR, stable renal function, liver function (ALP 67, AST 99, ALT 22, total bilirubin 1.1), temperature normal, blood pressure 211/104, heart rate 69, RR 18, oxygen saturation 100% on room air.  Chest x-ray negative.  Patient is placed on progressive bed for observation  Review of Systems:   General: no fevers, chills, no body weight gain, has fatigue HEENT: no blurry vision, hearing changes or sore throat Respiratory: has dyspnea, coughing, no wheezing CV: has chest pain, no palpitations GI: no nausea, vomiting, abdominal pain, diarrhea, constipation GU: no dysuria, burning on urination,  increased urinary frequency, hematuria  Ext: has leg edema Neuro: no unilateral weakness, numbness, or tingling, no vision change or hearing loss Skin: no rash, no skin tear. MSK: No muscle spasm, no deformity, no limitation of range of movement in spin Heme: No easy bruising.  Travel history: No recent long distant travel.  Allergy:  Allergies  Allergen Reactions   Hydrocodone-Acetaminophen Other (See Comments)    Reaction: pt can't take anything with tylenol due to her liver disease.    Ibuprofen Other (See Comments)    Esophageal varices   Naproxen Other (See Comments)    Esophageal varices   Propoxyphene Other (See Comments)    Reaction: pt isn't sure, but knows she can't take it.    Shellfish Allergy Hives   Tamiflu  [Oseltamivir Phosphate] Other (See Comments)    Esophageal varices    Past Medical History:  Diagnosis Date   Alcoholic cirrhosis of liver (HCC)    Asthma    CHF (congestive heart failure) (HCC)    Chronic disease anemia    Esophageal varices (HCC)    GR I on EGD by Dr Shelle Iron 09/2014   ETOH abuse    Hypertension    Hypothyroidism    Murmur    Portal hypertension (HCC)    Sarcoidosis     Past Surgical History:  Procedure Laterality Date   ESOPHAGOGASTRODUODENOSCOPY (EGD) WITH PROPOFOL N/A 04/04/2015   Procedure: ESOPHAGOGASTRODUODENOSCOPY (EGD) WITH PROPOFOL;  Surgeon: Midge Minium, MD;  Location: [facility] ENDOSCOPY;  Service: Endoscopy;  Laterality: N/A;  Dr Servando Snare prefers 1445   ESOPHAGOGASTRODUODENOSCOPY W/ BANDING  09/2014   Shelle Iron  TUBAL LIGATION      Social History:  reports that she has never smoked. She has never used smokeless tobacco. She reports current alcohol use of about 15.0 standard drinks of alcohol per week. She reports that she does not use drugs.  Family History:  Family History  Problem Relation Age of Onset   Aneurysm Father    COPD Father    Heart attack Mother      Prior to Admission medications   Medication Sig Start Date End  Date Taking? Authorizing Provider  Baclofen 5 MG TABS Take 5 mg by mouth 3 (three) times daily as needed. 04/14/20   Enid Derry, PA-C  levothyroxine (SYNTHROID) 175 MCG tablet Take 1 tablet (175 mcg total) by mouth daily before breakfast. 11/23/19 12/23/19  Lolita Patella B, MD  lidocaine (LIDODERM) 5 % Place 1 patch onto the skin daily. Remove & Discard patch within 12 hours or as directed by MD 04/14/20   Enid Derry, PA-C  nadolol (CORGARD) 20 MG tablet Take 1 tablet (20 mg total) by mouth daily. 11/23/19 12/23/19  Tresa Moore, MD  spironolactone (ALDACTONE) 25 MG tablet Take 1 tablet (25 mg total) by mouth daily. 11/23/19 12/23/19  Tresa Moore, MD    Physical Exam: Vitals:   06/24/21 0314  BP: (!) 211/104  Pulse: 69  Resp: 18  Temp: 98.1 F (36.7 C)  TempSrc: Oral  SpO2: 100%   General: Not in acute distress HEENT:       Eyes: PERRL, EOMI, no scleral icterus.       ENT: No discharge from the ears and nose, no pharynx injection, no tonsillar enlargement.        Neck: No JVD, no bruit, no mass felt. Heme: No neck lymph node enlargement. Cardiac: S1/S2, RRR, No gallops or rubs. Respiratory: No rales, wheezing, rhonchi or rubs. GI: Soft, nondistended, nontender, no rebound pain, no organomegaly, BS present. GU: No hematuria Ext: 1+ pitting leg edema bilaterally. 1+DP/PT pulse bilaterally. Musculoskeletal: No joint deformities, No joint redness or warmth, no limitation of ROM in spin. Skin: No rashes.  Neuro: Alert, oriented X3, cranial nerves II-XII grossly intact, moves all extremities normally.  Psych: Patient is not psychotic, no suicidal or hemocidal ideation.  Labs on Admission: I have personally reviewed following labs and imaging studies  CBC: Recent Labs  Lab 06/24/21 0321 06/24/21 1036  WBC 3.0* 3.5*  HGB 7.2* 7.4*  HCT 24.2* 25.4*  MCV 71.8* 72.6*  PLT 128* 118*   Basic Metabolic Panel: Recent Labs  Lab 06/24/21 0351  NA 134*  K 3.8   CL 106  CO2 21*  GLUCOSE 91  BUN 14  CREATININE 1.23*  CALCIUM 8.3*   GFR: CrCl cannot be calculated (Unknown ideal weight.). Liver Function Tests: Recent Labs  Lab 06/24/21 0351  AST 99*  ALT 22  ALKPHOS 67  BILITOT 1.1  PROT 8.2*  ALBUMIN 3.7   No results for input(s): LIPASE, AMYLASE in the last 168 hours. No results for input(s): AMMONIA in the last 168 hours. Coagulation Profile: Recent Labs  Lab 06/24/21 1036  INR 1.2   Cardiac Enzymes: No results for input(s): CKTOTAL, CKMB, CKMBINDEX, TROPONINI in the last 168 hours. BNP (last 3 results) No results for input(s): PROBNP in the last 8760 hours. HbA1C: No results for input(s): HGBA1C in the last 72 hours. CBG: No results for input(s): GLUCAP in the last 168 hours. Lipid Profile: No results for input(s): CHOL, HDL, LDLCALC, TRIG, CHOLHDL, LDLDIRECT  in the last 72 hours. Thyroid Function Tests: No results for input(s): TSH, T4TOTAL, FREET4, T3FREE, THYROIDAB in the last 72 hours. Anemia Panel: Recent Labs    06/24/21 0321 06/24/21 0351  FOLATE  --  5.0*  FERRITIN  --  8*  TIBC  --  461*  IRON  --  34  RETICCTPCT 1.0  --    Urine analysis:    Component Value Date/Time   COLORURINE YELLOW (A) 04/14/2020 1109   APPEARANCEUR CLEAR (A) 04/14/2020 1109   APPEARANCEUR Clear 09/25/2014 1610   LABSPEC 1.017 04/14/2020 1109   LABSPEC 1.023 09/25/2014 1610   PHURINE 6.0 04/14/2020 1109   GLUCOSEU NEGATIVE 04/14/2020 1109   GLUCOSEU Negative 09/25/2014 1610   HGBUR NEGATIVE 04/14/2020 1109   BILIRUBINUR NEGATIVE 04/14/2020 1109   BILIRUBINUR Negative 09/25/2014 1610   KETONESUR NEGATIVE 04/14/2020 1109   PROTEINUR NEGATIVE 04/14/2020 1109   NITRITE NEGATIVE 04/14/2020 1109   LEUKOCYTESUR NEGATIVE 04/14/2020 1109   LEUKOCYTESUR Negative 09/25/2014 1610   Sepsis Labs: @LABRCNTIP (procalcitonin:4,lacticidven:4) )No results found for this or any previous visit (from the past 240 hour(s)).   Radiological  Exams on Admission: DG Chest 2 View  Result Date: 06/24/2021 CLINICAL DATA:  Chest pain EXAM: CHEST - 2 VIEW COMPARISON:  04/14/2020 FINDINGS: Lungs are well expanded, symmetric, and clear. No pneumothorax or pleural effusion. Cardiac size within normal limits. Pulmonary vascularity is normal. Osseous structures are age-appropriate. No acute bone abnormality. IMPRESSION: No active cardiopulmonary disease. Electronically Signed   By: Helyn NumbersAshesh  Parikh MD   On: 06/24/2021 03:50     EKG: I have personally reviewed.  Sinus rhythm, QTC 438, low voltage, T wave flattening  Assessment/Plan Principal Problem:   Symptomatic anemia Active Problems:   Cirrhosis (HCC)   Hypothyroid   Hypertension   Chest pain   Hypertensive urgency   Asthma   CKD (chronic kidney disease), stage IIIa   Chronic CHF (HCC)   Pancytopenia (HCC)   Symptomatic anemia: Patient's hemoglobin dropped from 8.6 on 04/14/2020 to 7.2 today.   Patient has history of liver cirrhosis with portal hypertension and esophageal varices,  at high risk for GI bleeding, but patient denies dark stool or rectal bleeding. The repeated CBC showed hemoglobin 7.4 which is stable, indicating patient is not actively bleeding.  Patient could have had GI loss at some time point.  -Placed on progressive bed for observation -Transfuse 1 unit of blood -Start Protonix orally 40 mg twice daily -check anemia panel -f/u CBC q6h  Pancytopenia: Patient may have alcoholic bone marrow suppression -f/u anemia panel -may need to f/u with hematology  Addendum: Anemia panel showed iron deficiency -Give 510 mg of IV Feraheme -start ferrous sulfate -Start as needed senokote  Cirrhosis [facility](HCC): Patient has mild liver function abnormality -check INR/PTT -Patient is on nadolol and spironolactone, but not taking these medications currently  Hypothyroid -Synthroid  Hypertension and hypertensive urgency: Blood pressure 211/104, this is due to medication  noncompliance -Start amlodipine 10 mg daily -Restart home nadolol and spironolactone  Chest pain: Most likely due to demand ischemia.  Troponin 11. -Blood pressure control -Blood transfusion as above -Trend troponin -Check A1c, FLP  Asthma: -As needed albuterol  CKD (chronic kidney disease), stage IIIa: Stable.  Recent baseline creatinine 1.35 on 04/14/2020.  Her creatinine is 1.23, BUN 14. -Follow-up renal function by BMP  Chronic CHF [facility](HCC): No 2D echo on record, not sure which type of CHF.  Patient has a long history of uncontrolled hypertension, likely has diastolic  congestive heart failure.  Patient has 1+ leg edema, but no, JVD, no oxygen desaturation, no pulmonary on chest x-ray, CHF seem to be compensated. -Continue home spironolactone -check BNP          DVT ppx: SCD Code Status: Full code Family Communication: not done, no family member is at bed side.  I have tried to call her 3 sisters without success Disposition Plan:  Anticipate discharge back to previous environment Consults called:  none Admission status and Level of care: Progressive Cardiac:   for obs   Status is: Observation  The patient remains OBS appropriate and will d/c before 2 midnights.  Dispo: The patient is from: Home              Anticipated d/c is to: Home              Patient currently is not medically stable to d/c.   Difficult to place patient No          Date of Service 06/24/2021    Lorretta Harp [facility]   If 7PM-7AM, please contact night-coverage [email or web address] 06/24/2021, 11:25 AM

## 2021-06-24 NOTE — ED Provider Notes (Signed)
[facility]  ____________________________________________   Event Date/Time   First MD Initiated Contact with Patient 06/24/21 [phone]     (approximate)  I have reviewed the triage vital signs and the nursing notes.   HISTORY  Chief Complaint Back Pain    HPI Kathryn [name] is a 65 y.o. female cirrhosis, portal HTN, sarcoidosis, asthma chronic anemia who presents with cough, chest pain and shortness of breath.  Symptoms started 4 days ago.  Patient endorses dyspnea on exertion as well as intermittent pressure in the center of her chest.  Occasionally gets the chest pain while walking.  Has also had a nonproductive cough as well as intermittent night sweats.  She also endorses left-sided lower back pain which she has had in the past in the setting of pneumonia.  Denies any radicular symptoms, bowel or bladder incontinence.  Denies any blood in the stool, hematemesis.  Does take iron supplements.  Has had transfusions in the past.         Past Medical History:  Diagnosis Date   Alcoholic cirrhosis of liver (HCC)    Asthma    CHF (congestive heart failure) (HCC)    Chronic disease anemia    Esophageal varices (HCC)    GR I on EGD by Dr Shelle Iron 09/2014   ETOH abuse    Hypertension    Hypothyroidism    Murmur    Portal hypertension (HCC)    Sarcoidosis     Patient Active Problem List   Diagnosis Date Noted   Community acquired pneumonia 11/20/2019   Pneumonia due to COVID-19 virus 11/20/2019   Anemia 06/12/2016   Hypoglycemia 02/11/2016   Hematemesis 04/04/2015   Cirrhosis (HCC) 04/04/2015   Esophageal varices (HCC) 04/04/2015   Hypothyroid 04/04/2015   Hypertension 04/04/2015   Chest pain 04/04/2015    Past Surgical History:  Procedure Laterality Date   ESOPHAGOGASTRODUODENOSCOPY (EGD) WITH PROPOFOL N/A 04/04/2015   Procedure: ESOPHAGOGASTRODUODENOSCOPY (EGD) WITH PROPOFOL;  Surgeon: Midge Minium, MD;  Location: [facility] ENDOSCOPY;  Service: Endoscopy;   Laterality: N/A;  Dr Servando Snare prefers 1445   ESOPHAGOGASTRODUODENOSCOPY W/ BANDING  09/2014   Rein   TUBAL LIGATION      Prior to Admission medications   Medication Sig Start Date End Date Taking? Authorizing Provider  Baclofen 5 MG TABS Take 5 mg by mouth 3 (three) times daily as needed. 04/14/20   Enid Derry, PA-C  levothyroxine (SYNTHROID) 175 MCG tablet Take 1 tablet (175 mcg total) by mouth daily before breakfast. 11/23/19 12/23/19  Lolita Patella B, MD  lidocaine (LIDODERM) 5 % Place 1 patch onto the skin daily. Remove & Discard patch within 12 hours or as directed by MD 04/14/20   Enid Derry, PA-C  nadolol (CORGARD) 20 MG tablet Take 1 tablet (20 mg total) by mouth daily. 11/23/19 12/23/19  Tresa Moore, MD  spironolactone (ALDACTONE) 25 MG tablet Take 1 tablet (25 mg total) by mouth daily. 11/23/19 12/23/19  Tresa Moore, MD    Allergies Hydrocodone-acetaminophen, Ibuprofen, Naproxen, Propoxyphene, Shellfish allergy, and Tamiflu  [oseltamivir phosphate]  Family History  Problem Relation Age of Onset   Aneurysm Father    COPD Father    Heart attack Mother     Social History Social History   Tobacco Use   Smoking status: Never   Smokeless tobacco: Never  Substance Use Topics   Alcohol use: Yes    Alcohol/week: 15.0 standard drinks    Types: 15 Shots of liquor per week  Comment: Quit ETOH- 04/04/15, Heavy Use prior to this   Drug use: No    Review of Systems   Review of Systems  Constitutional:  Positive for activity change and chills. Negative for appetite change and fever.  Respiratory:  Positive for cough, chest tightness and shortness of breath.   Cardiovascular:  Positive for chest pain and leg swelling.  Gastrointestinal:  Negative for abdominal pain, blood in stool, diarrhea and vomiting.  Genitourinary:  Negative for dysuria.  Musculoskeletal:  Positive for back pain.  Neurological:  Negative for weakness and numbness.  All other systems  reviewed and are negative.  Physical Exam Updated Vital Signs BP (!) 211/104 (BP Location: Right Arm)   Pulse 69   Temp 98.1 F (36.7 C) (Oral)   Resp 18   SpO2 100%   Physical Exam Vitals and nursing note reviewed.  Constitutional:      General: She is not in acute distress.    Appearance: Normal appearance. She is not toxic-appearing.  HENT:     Head: Normocephalic and atraumatic.     Nose: Nose normal. No congestion.     Mouth/Throat:     Mouth: Mucous membranes are moist.  Eyes:     General: No scleral icterus.    Conjunctiva/sclera: Conjunctivae normal.  Cardiovascular:     Rate and Rhythm: Normal rate and regular rhythm.  Pulmonary:     Effort: Pulmonary effort is normal. No respiratory distress.     Breath sounds: Normal breath sounds. No wheezing.  Abdominal:     Palpations: Abdomen is soft.     Tenderness: There is no abdominal tenderness. There is no guarding.  Musculoskeletal:        General: Tenderness present. No swelling, deformity or signs of injury. Normal range of motion.     Cervical back: Neck supple. No rigidity.     Comments: Tenderness to palpation over the left SI joint, 5/5 strength with plantar and dorsiflexion, hip flexion  Skin:    General: Skin is warm and dry.     Coloration: Skin is not jaundiced.  Neurological:     General: No focal deficit present.     Mental Status: She is alert and oriented to person, place, and time.  Psychiatric:        Mood and Affect: Mood normal.        Behavior: Behavior normal.     LABS (all labs ordered are listed, but only abnormal results are displayed)  Labs Reviewed  CBC - Abnormal; Notable for the following components:      Result Value   WBC 3.0 (*)    RBC 3.37 (*)    Hemoglobin 7.2 (*)    HCT 24.2 (*)    MCV 71.8 (*)    MCH 21.4 (*)    MCHC 29.8 (*)    RDW 23.9 (*)    Platelets 128 (*)    All other components within normal limits  COMPREHENSIVE METABOLIC PANEL - Abnormal; Notable for the  following components:   Sodium 134 (*)    CO2 21 (*)    Creatinine, Ser 1.23 (*)    Calcium 8.3 (*)    Total Protein 8.2 (*)    AST 99 (*)    GFR, Estimated 49 (*)    All other components within normal limits  RESP PANEL BY RT-PCR (FLU A&B, COVID) ARPGX2  URINALYSIS, COMPLETE (UACMP) WITH MICROSCOPIC  PREPARE RBC (CROSSMATCH)  TYPE AND SCREEN  TROPONIN I (HIGH SENSITIVITY)  ____________________________________________  EKG ED ECG REPORT I, Randol Kern, the attending physician, personally viewed and interpreted this ECG.  Date: 06/24/2021  Rhythm: normal sinus rhythm QRS Axis: normal Intervals: normal ST/T Wave abnormalities: normal Narrative Interpretation: no evidence of acute ischemia  ___________________________________  RADIOLOGY I, Randol Kern, personally viewed and evaluated these images (plain radiographs) as part of my medical decision making, as well as reviewing the written report by the radiologist.  ED MD interpretation: Chest x-ray PA and lateral which does not show any acute cardiopulmonary process    ____________________________________________   PROCEDURES  Procedure(s) performed (including Critical Care):  Procedures   ____________________________________________   INITIAL IMPRESSION / ASSESSMENT AND PLAN / ED COURSE     Patient is a 65 year old presents with dyspnea on exertion chest pain and found to be anemic to hemoglobin of 7.2.  Patient has a history of chronic anemia, was 8.61-year ago.  Vital signs notable for hypertension she has been notably out of her blood pressure medicines over the last week.  On exam she appears overall well.  Back pain seems musculoskeletal, low suspicion for dissection or other acute process.  Her EKG does not show any ischemic changes and troponin is negative.  However with her hemoglobin of 7.2 and her new dyspnea on exertion as well as chest pain and concern for symptomatic anemia.  We will  initiate blood transfusion in the ED.  And admit for ongoing work-up.       ____________________________________________   FINAL CLINICAL IMPRESSION(S) / ED DIAGNOSES  Final diagnoses:  None     ED Discharge Orders     None        Note:  This document was prepared using Dragon voice recognition software and may include unintentional dictation errors.    Georga Hacking, MD 06/24/21 [phone]

## 2021-06-24 NOTE — ED Notes (Signed)
Attempted IV start, unsuccessful. Patient tolerated well

## 2021-06-24 NOTE — ED Triage Notes (Addendum)
Pt reports she started having lower back pain over the weekend. Started having sweats, dry cough, shortness of breath and chest pain since yesterday. Pt has been out of her bp medications for about a week.

## 2021-06-24 NOTE — ED Notes (Signed)
Assisted to patient to the restroom; ambulated with one person assisted/ steady gait otherwise

## 2021-06-25 ENCOUNTER — Observation Stay (HOSPITAL_COMMUNITY)
Admit: 2021-06-25 | Discharge: 2021-06-25 | Disposition: A | Discharge status: Home / Self Care | Payer: Medicare Other | Attending: Internal Medicine | Admitting: Internal Medicine

## 2021-06-25 ENCOUNTER — Encounter
Admission: EM | Disposition: A | Discharge status: Home / Self Care | Payer: Self-pay | Source: Home / Self Care | Attending: Internal Medicine

## 2021-06-25 DIAGNOSIS — I5031 Acute diastolic (congestive) heart failure: Secondary | ICD-10-CM

## 2021-06-25 DIAGNOSIS — D649 Anemia, unspecified: Secondary | ICD-10-CM

## 2021-06-25 LAB — LIPID PANEL
Cholesterol: 232 mg/dL — ABNORMAL HIGH (ref 0–200)
HDL: 108 mg/dL (ref >40)
LDL Cholesterol: 111 mg/dL — ABNORMAL HIGH (ref 0–99)
Total CHOL/HDL Ratio: 2.1 RATIO
Triglycerides: 67 mg/dL (ref <150)
VLDL: 13 mg/dL (ref 0–40)

## 2021-06-25 LAB — CBC
HCT: 25.9 % — ABNORMAL LOW (ref 36.0–46.0)
HCT: 27.7 % — ABNORMAL LOW (ref 36.0–46.0)
HCT: 32.9 % — ABNORMAL LOW (ref 36.0–46.0)
Hemoglobin: 7.9 g/dL — ABNORMAL LOW (ref 12.0–15.0)
Hemoglobin: 8.2 g/dL — ABNORMAL LOW (ref 12.0–15.0)
Hemoglobin: 9.7 g/dL — ABNORMAL LOW (ref 12.0–15.0)
MCH: 21.9 pg — ABNORMAL LOW (ref 26.0–34.0)
MCH: 22 pg — ABNORMAL LOW (ref 26.0–34.0)
MCH: 21.3 pg — ABNORMAL LOW (ref 26.0–34.0)
MCHC: 30.5 g/dL (ref 30.0–36.0)
MCHC: 29.6 g/dL — ABNORMAL LOW (ref 30.0–36.0)
MCHC: 29.5 g/dL — ABNORMAL LOW (ref 30.0–36.0)
MCV: 71.9 fL — ABNORMAL LOW (ref 80.0–100.0)
MCV: 74.3 fL — ABNORMAL LOW (ref 80.0–100.0)
MCV: 72.1 fL — ABNORMAL LOW (ref 80.0–100.0)
Platelets: 126 10*3/uL — ABNORMAL LOW (ref 150–400)
Platelets: 136 10*3/uL — ABNORMAL LOW (ref 150–400)
Platelets: 173 10*3/uL (ref 150–400)
RBC: 3.6 MIL/uL — ABNORMAL LOW (ref 3.87–5.11)
RBC: 3.73 MIL/uL — ABNORMAL LOW (ref 3.87–5.11)
RBC: 4.56 MIL/uL (ref 3.87–5.11)
RDW: 23.1 % — ABNORMAL HIGH (ref 11.5–15.5)
RDW: 23.6 % — ABNORMAL HIGH (ref 11.5–15.5)
RDW: 24 % — ABNORMAL HIGH (ref 11.5–15.5)
WBC: 4.6 10*3/uL (ref 4.0–10.5)
WBC: 4.9 10*3/uL (ref 4.0–10.5)
WBC: 6.3 10*3/uL (ref 4.0–10.5)
nRBC: 0 % (ref 0.0–0.2)
nRBC: 0.4 % — ABNORMAL HIGH (ref 0.0–0.2)
nRBC: 0.3 % — ABNORMAL HIGH (ref 0.0–0.2)

## 2021-06-25 LAB — BASIC METABOLIC PANEL
Anion gap: 8 (ref 5–15)
BUN: 11 mg/dL (ref 8–23)
CO2: 23 mmol/L (ref 22–32)
Calcium: 8.7 mg/dL — ABNORMAL LOW (ref 8.9–10.3)
Chloride: 105 mmol/L (ref 98–111)
Creatinine, Ser: 1.17 mg/dL — ABNORMAL HIGH (ref 0.44–1.00)
GFR, Estimated: 52 mL/min — ABNORMAL LOW (ref >60)
Glucose, Bld: 85 mg/dL (ref 70–99)
Potassium: 4 mmol/L (ref 3.5–5.1)
Sodium: 136 mmol/L (ref 135–145)

## 2021-06-25 LAB — BPAM RBC
Blood Product Expiration Date: 202208032359
ISSUE DATE / TIME: 202208021147
Unit Type and Rh: 9500

## 2021-06-25 LAB — HIV ANTIBODY (ROUTINE TESTING W REFLEX): HIV Screen 4th Generation wRfx: NONREACTIVE

## 2021-06-25 LAB — TYPE AND SCREEN
ABO/RH(D): A POS
Antibody Screen: NEGATIVE
Unit division: 0

## 2021-06-25 LAB — HEMOGLOBIN A1C
Hgb A1c MFr Bld: 5.3 % (ref 4.8–5.6)
Mean Plasma Glucose: 105.41 mg/dL

## 2021-06-25 LAB — CBG MONITORING, ED: Glucose-Capillary: 105 mg/dL — ABNORMAL HIGH (ref 70–99)

## 2021-06-25 SURGERY — EGD (ESOPHAGOGASTRODUODENOSCOPY)
Anesthesia: General

## 2021-06-25 MED ORDER — OCTREOTIDE LOAD VIA INFUSION
50.0000 ug | Freq: Once | INTRAVENOUS | Status: AC
  Administered 2021-06-25: 50 ug via INTRAVENOUS
  Filled 2021-06-25: qty 25

## 2021-06-25 MED ORDER — SODIUM CHLORIDE 0.9 % IV SOLN
50.0000 ug/h | INTRAVENOUS | Status: DC
  Administered 2021-06-25 – 2021-06-26 (×3): 50 ug/h via INTRAVENOUS
  Filled 2021-06-25 (×7): qty 1

## 2021-06-25 NOTE — ED Notes (Signed)
Pt does not have any back pain at this time. Lidocaine patch held per her request. If back pain returns and pt wants lidocaine patch, she verbalizes understanding to let staff know.

## 2021-06-25 NOTE — ED Notes (Signed)
GI MD at bedside assessing pt and updating pt on plan of care. Per MD, pt may have clear liquids, then be NPO after midnight.

## 2021-06-25 NOTE — ED Notes (Signed)
Pt ambulated to BR with steady gait. Standby assist utilized. Pt tolerated well.

## 2021-06-25 NOTE — ED Notes (Signed)
Report given to Endo RN. Per Endo RN, planning for pt to be picked up around 1630.

## 2021-06-25 NOTE — ED Notes (Signed)
Pt ambulated to BR with steady gait. Family member arrived at bedside at this time.

## 2021-06-25 NOTE — Progress Notes (Signed)
PROGRESS NOTE    Kathryn [name]  MHD:[id number] DOB: 05/09/1956 DOA: 06/24/2021 PCP: Preston Fleeting, MD    Brief Narrative:  Kathryn [name] is a 65 y.o. female with medical history significant of alcohol abuse, alcoholic liver cirrhosis, portal vein hypertension, esophageal varices, hypertension, asthma, hypothyroidism, sarcoidosis, anemia, CHF (not 2D echo on record), CKD stage IIIa, who presents with shortness breath, generalized weakness. Patient states that she has shortness breath and generalized weakness for more than 1 week.   Consultants:    Procedures:   Antimicrobials:      Subjective: Had bm, did not see blood.  No hematoemesis.  Objective: Vitals:   06/25/21 1430 06/25/21 1500 06/25/21 1600 06/25/21 1615  BP: (!) 161/90 (!) 150/92 (!) 146/93   Pulse: (!) 52 (!) 52 (!) 53 (!) 56  Resp:   16   Temp:   98.1 F (36.7 C)   TempSrc:   Oral   SpO2: 100% 100% 100%   Weight:      Height:        Intake/Output Summary (Last 24 hours) at 06/25/2021 1630 Last data filed at 06/25/2021 0814 Gross per 24 hour  Intake 166.83 ml  Output --  Net 166.83 ml   Filed Weights   06/24/21 2337  Weight: 84 kg    Examination:  General exam: Appears calm and comfortable  Respiratory system: Clear to auscultation. Respiratory effort normal. Cardiovascular system: S1 & S2 heard, RRR. No JVD, murmurs, rubs, gallops or clicks.  Gastrointestinal system: Abdomen is nondistended, soft and nontender. Normal bowel sounds heard. Central nervous system: Alert and oriented.grossly intact Extremities: edema b/l Psychiatry: Mood & affect appropriate.     Data Reviewed: I have personally reviewed following labs and imaging studies  CBC: Recent Labs  Lab 06/24/21 1630 06/24/21 2138 06/25/21 0339 06/25/21 0950 06/25/21 1538  WBC 3.7* 5.1 4.6 4.9 6.3  HGB 8.8* 8.0* 7.9* 8.2* 9.7*  HCT 29.0* 25.6* 25.9* 27.7* 32.9*  MCV 72.7* 71.5* 71.9* 74.3* 72.1*  PLT 113* 124* 126* 136*  173   Basic Metabolic Panel: Recent Labs  Lab 06/24/21 0351 06/25/21 0339  NA 134* 136  K 3.8 4.0  CL 106 105  CO2 21* 23  GLUCOSE 91 85  BUN 14 11  CREATININE 1.23* 1.17*  CALCIUM 8.3* 8.7*   GFR: Estimated Creatinine Clearance: 50.9 mL/min (A) (by C-G formula based on SCr of 1.17 mg/dL (H)). Liver Function Tests: Recent Labs  Lab 06/24/21 0351  AST 99*  ALT 22  ALKPHOS 67  BILITOT 1.1  PROT 8.2*  ALBUMIN 3.7   No results for input(s): LIPASE, AMYLASE in the last 168 hours. No results for input(s): AMMONIA in the last 168 hours. Coagulation Profile: Recent Labs  Lab 06/24/21 1036  INR 1.2   Cardiac Enzymes: No results for input(s): CKTOTAL, CKMB, CKMBINDEX, TROPONINI in the last 168 hours. BNP (last 3 results) No results for input(s): PROBNP in the last 8760 hours. HbA1C: Recent Labs    06/24/21 1630  HGBA1C 5.3   CBG: Recent Labs  Lab 06/25/21 0953  GLUCAP 105*   Lipid Profile: Recent Labs    06/25/21 0339  CHOL 232*  HDL 108  LDLCALC 111*  TRIG 67  CHOLHDL 2.1   Thyroid Function Tests: No results for input(s): TSH, T4TOTAL, FREET4, T3FREE, THYROIDAB in the last 72 hours. Anemia Panel: Recent Labs    06/24/21 0321 06/24/21 0351 06/24/21 1036  VITAMINB12  --   --  447  FOLATE  --  5.0*  --   FERRITIN  --  8*  --   TIBC  --  461*  --   IRON  --  34  --   RETICCTPCT 1.0  --   --    Sepsis Labs: No results for input(s): PROCALCITON, LATICACIDVEN in the last 168 hours.  Recent Results (from the past 240 hour(s))  Resp Panel by RT-PCR (Flu A&B, Covid) Nasopharyngeal Swab     Status: None   Collection Time: 06/24/21 11:02 AM   Specimen: Nasopharyngeal Swab; Nasopharyngeal(NP) swabs in vial transport medium  Result Value Ref Range Status   SARS Coronavirus 2 by RT PCR NEGATIVE NEGATIVE Final    Comment: (NOTE) SARS-CoV-2 target nucleic acids are NOT DETECTED.  The SARS-CoV-2 RNA is generally detectable in upper respiratory specimens  during the acute phase of infection. The lowest concentration of SARS-CoV-2 viral copies this assay can detect is 138 copies/mL. A negative result does not preclude SARS-Cov-2 infection and should not be used as the sole basis for treatment or other patient management decisions. A negative result may occur with  improper specimen collection/handling, submission of specimen other than nasopharyngeal swab, presence of viral mutation(s) within the areas targeted by this assay, and inadequate number of viral copies(<138 copies/mL). A negative result must be combined with clinical observations, patient history, and epidemiological information. The expected result is Negative.  Fact Sheet for Patients:  BloggerCourse.com  Fact Sheet for Healthcare Providers:  SeriousBroker.it  This test is no t yet approved or cleared by the [place] FDA and  has been authorized for detection and/or diagnosis of SARS-CoV-2 by FDA under an Emergency Use Authorization (EUA). This EUA will remain  in effect (meaning this test can be used) for the duration of the COVID-19 declaration under Section 564(b)(1) of the Act, 21 U.S.C.section 360bbb-3(b)(1), unless the authorization is terminated  or revoked sooner.       Influenza A by PCR NEGATIVE NEGATIVE Final   Influenza B by PCR NEGATIVE NEGATIVE Final    Comment: (NOTE) The Xpert Xpress SARS-CoV-2/FLU/RSV plus assay is intended as an aid in the diagnosis of influenza from Nasopharyngeal swab specimens and should not be used as a sole basis for treatment. Nasal washings and aspirates are unacceptable for Xpert Xpress SARS-CoV-2/FLU/RSV testing.  Fact Sheet for Patients: BloggerCourse.com  Fact Sheet for Healthcare Providers: SeriousBroker.it  This test is not yet approved or cleared by the [place] FDA and has been authorized for detection  and/or diagnosis of SARS-CoV-2 by FDA under an Emergency Use Authorization (EUA). This EUA will remain in effect (meaning this test can be used) for the duration of the COVID-19 declaration under Section 564(b)(1) of the Act, 21 U.S.C. section 360bbb-3(b)(1), unless the authorization is terminated or revoked.  Performed at [facility], [address], [place], [place] [address]          Radiology Studies: DG Chest 2 View  Result Date: 06/24/2021 CLINICAL DATA:  Chest pain EXAM: CHEST - 2 VIEW COMPARISON:  04/14/2020 FINDINGS: Lungs are well expanded, symmetric, and clear. No pneumothorax or pleural effusion. Cardiac size within normal limits. Pulmonary vascularity is normal. Osseous structures are age-appropriate. No acute bone abnormality. IMPRESSION: No active cardiopulmonary disease. Electronically Signed   By: Helyn Numbers MD   On: 06/24/2021 03:50        Scheduled Meds:  amLODipine  10 mg Oral Daily   ferrous sulfate  325 mg Oral QODAY   folic  acid  1 mg Oral Daily   levothyroxine  175 mcg Oral QAC breakfast   lidocaine  1 patch Transdermal Q24H   LORazepam  0-4 mg Intravenous Q6H   Followed by   Melene Muller ON 06/26/2021] LORazepam  0-4 mg Intravenous Q12H   multivitamin with minerals  1 tablet Oral Daily   nadolol  20 mg Oral Daily   nicotine  21 mg Transdermal Daily   octreotide  50 mcg Intravenous Once   pantoprazole (PROTONIX) IV  40 mg Intravenous Q12H   spironolactone  12.5 mg Oral Daily   thiamine  100 mg Oral Daily   Or   thiamine  100 mg Intravenous Daily   Continuous Infusions:  octreotide  (SANDOSTATIN)    IV infusion      Assessment & Plan:   Principal Problem:   Symptomatic anemia Active Problems:   Cirrhosis (HCC)   Hypothyroid   Hypertension   Chest pain   Hypertensive urgency   Asthma   CKD (chronic kidney disease), stage IIIa   Chronic CHF (HCC)   Pancytopenia (HCC)   Symptomatic anemia Iron deficiency: Patient has history  of liver cirrhosis with portal hypertension and esophageal varices,  at high risk for GI bleeding, but patient denies dark stool or rectal bleeding.  8/3- s/p PRBC 1 unit  GI consulted as pt told me has not seen GI for a long time. Continue PPI Given iv feraheme   Pancytopenia:  Likely due to alcohol   Cirrhosis (HCC): -Patient is on nadolol and spironolactone, but not taking these medications currently 8/3- gi consulted Started on octreotide    Hypothyroid Continue Synthroid   Hypertension and hypertensive urgency: Blood pressure 211/104, this is due to medication noncompliance Startd on amlodipine Continue nadolol and spironolactone     Chest pain: Most likely due to demand ischemia.  Troponin 11. -Blood pressure control -Blood transfusion as above -Check A1c, FLP   Asthma: -As needed albuterol   CKD (chronic kidney disease), stage IIIa: Stable.  Recent baseline creatinine 1.35 on 04/14/2020.  Her creatinine is 1.23, BUN 14. -Follow-up renal function by BMP   Chronic CHF [facility]): No 2D echo on record, not sure which type of CHF.  Patient has a long history of uncontrolled hypertension, likely has diastolic congestive heart failure.  Patient has 1+ leg edema, but no, JVD, no oxygen desaturation, no pulmonary on chest x-ray, CHF seem to be compensated. Will ck echo         DVT prophylaxis: scd Code Status:full Family Communication: none at bedside Disposition Plan:  Status is: Observation  The patient remains OBS appropriate and will d/c before 2 midnights.  Dispo: The patient is from: Home              Anticipated d/c is to: Home              Patient currently is not medically stable to d/c.   Difficult to place patient No            LOS: 0 days   Time spent: 35 minutes more than 50% on COC    Lynn Ito, MD [facility] Pager [phone]  If 7PM-7AM, please contact night-coverage 06/25/2021, 4:30 PM

## 2021-06-25 NOTE — H&P (View-Only) (Signed)
GI Inpatient Consult Note  Reason for Consult: Symptomatic anemia, alcoholic cirrhosis of the liver c/b esophageal varices and portal hypertension   Attending Requesting Consult:  History of Present Illness: Kathryn [name] is a 65 y.o. female seen for evaluation of symptomatic anemia and decompensated alcoholic cirrhosis of the liver c/b portal hypertension and Grade II esophageal varices with hemorrhage s/p banding 2016 at the request of Dr. Ivor Costa. Pt has a PMH of uncontrolled HTN, hypothyroidism, sarcoidosis, EtOH abuse, medication noncompliance, asthma, CHF, and anemia of chronic disease. She presented to the [facility] ED early this morning for chief complaint of progressive shortness of breath, sweats, dry cough, and chest pain over the past four days. She was also complaining of worsening low back pain x 1 week. Upon presentation to the ED, patient was found to have hypertensive urgency (BP 211/104), HR 69, RR 18, and O2 sats 100% on room air. Labs were significant for pancytopenia - WBC 3.0K, hemoglobin 7.2, and Plt 128K. Hemoglobin was 8.6 one year ago. LFTs were mildly elevated with AST 99, ALT 22, alk phos 67, and tbili 1.1. Anemia work-up was significant for iron-deficiency anemia with iron sat 7%, iron 34, TIBC 461, and ferritin 8. She was transfused 1 unit pRBCs, started on Protonix 40 mg PO BID, and admitted to the hospital. GI was consulted in context of symptomatic anemia and decompensated cirrhosis.   Patient seen in room today and examined. She reports she is feeling better. She denies any overt gastrointestinal blood loss. She has not had any hematemesis, coffee-ground emesis, hematochezia, or melena. She denies any recent changes in her bowel habits. Last BM was yesterday morning and was brown per patient. She has not followed up with her primary gastroenterologist since 2016. She was hospitalized in 2017 for symptomatic anemia and hematemesis where EGD was planned as inpatient was  ultimately cancelled due to hospital wide A/C issues. Plan was to perform EGD as outpatient but patient never followed up for procedure. She reports knowing about alcoholic cirrhosis for over 10 years. She is still currently drinking daily and can have 1-2 glasses of wine daily. She denies any jaundice, altered mental status, abdominal swelling, lower extremity edema, pruritus, nausea, or vomiting. She reports she is wanting to stop drinking and knows it is going to kill her if she doesn't. She would like a medication to take to make her stop drinking. She doesn't think she needs rehab. She reports she forgets to take a lot of her medications at home and has run out of most of her chronic medications.    Last EGD 03/2015: Impression:  - Grade II esophageal varices. Completely eradicated. Banded. - Portal hypertensive gastropathy. - Duodenitis. - No specimens collected.  Summary of other GI Procedures:  EGD 09/2014 - Grade I varices found at GEJ, mildly severe reflux esophagitis, portal hypertensive gastropathy, normal duodenum EGD 12/2012 - Grade II-III varices s/p banding EGD 08/2010 - Grade III esophageal varices s/p banding and PHG CSY 08/2010 - normal examined colon, internal hemorrhoids EGD 10/2008 - large varices with hemorrhage s/p banding   Past Medical History:  Past Medical History:  Diagnosis Date   Alcoholic cirrhosis of liver (HCC)    Asthma    CHF (congestive heart failure) ([place])    Chronic disease anemia    Esophageal varices ([place])    GR I on EGD by Dr Rayann Heman 09/2014   ETOH abuse    Hypertension    Hypothyroidism    Murmur  Portal hypertension ([place])    Sarcoidosis     Problem List: Patient Active Problem List   Diagnosis Date Noted   Symptomatic anemia 06/24/2021   Hypertensive urgency 06/24/2021   Asthma 06/24/2021   CKD (chronic kidney disease), stage IIIa 06/24/2021   Chronic CHF ([place]) 06/24/2021   Pancytopenia ([place]) 06/24/2021   Community acquired  pneumonia 11/20/2019   Pneumonia due to COVID-19 virus 11/20/2019   Anemia 06/12/2016   Hypoglycemia 02/11/2016   Hematemesis 04/04/2015   Cirrhosis ([place]) 04/04/2015   Esophageal varices ([place]) 04/04/2015   Hypothyroid 04/04/2015   Hypertension 04/04/2015   Chest pain 04/04/2015    Past Surgical History: Past Surgical History:  Procedure Laterality Date   ESOPHAGOGASTRODUODENOSCOPY (EGD) WITH PROPOFOL N/A 04/04/2015   Procedure: ESOPHAGOGASTRODUODENOSCOPY (EGD) WITH PROPOFOL;  Surgeon: Lucilla Lame, MD;  Location: [facility] ENDOSCOPY;  Service: Endoscopy;  Laterality: N/A;  Dr Allen Norris prefers 1445   ESOPHAGOGASTRODUODENOSCOPY W/ BANDING  09/2014   Rein   TUBAL LIGATION      Allergies: Allergies  Allergen Reactions   Hydrocodone-Acetaminophen Other (See Comments)    Reaction: pt can't take anything with tylenol due to her liver disease.    Ibuprofen Other (See Comments)    Esophageal varices   Naproxen Other (See Comments)    Esophageal varices   Propoxyphene Other (See Comments)    Reaction: pt isn't sure, but knows she can't take it.    Shellfish Allergy Hives   Tamiflu  [Oseltamivir Phosphate] Other (See Comments)    Esophageal varices    Home Medications: (Not in a hospital admission)  Home medication reconciliation was completed with the patient.   Scheduled Inpatient Medications:    amLODipine  10 mg Oral Daily   ferrous sulfate  325 mg Oral QODAY   folic acid  1 mg Oral Daily   levothyroxine  175 mcg Oral QAC breakfast   lidocaine  1 patch Transdermal Q24H   LORazepam  0-4 mg Intravenous Q6H   Followed by   Derrill Memo ON 06/26/2021] LORazepam  0-4 mg Intravenous Q12H   multivitamin with minerals  1 tablet Oral Daily   nadolol  20 mg Oral Daily   nicotine  21 mg Transdermal Daily   pantoprazole (PROTONIX) IV  40 mg Intravenous Q12H   spironolactone  12.5 mg Oral Daily   thiamine  100 mg Oral Daily   Or   thiamine  100 mg Intravenous Daily    Continuous Inpatient  Infusions:    PRN Inpatient Medications:  albuterol, dextromethorphan-guaiFENesin, hydrALAZINE, LORazepam **OR** LORazepam, morphine injection, ondansetron (ZOFRAN) IV, senna-docusate  Family History: family history includes Aneurysm in her father; COPD in her father; Heart attack in her mother.  The patient's family history is negative for inflammatory bowel disorders, GI malignancy, or solid organ transplantation.  Social History:   reports that she has never smoked. She has never used smokeless tobacco. She reports current alcohol use of about 15.0 standard drinks of alcohol per week. She reports that she does not use drugs. The patient denies ETOH, tobacco, or drug use.   Review of Systems: Constitutional: Weight is stable.  Eyes: No changes in vision. ENT: No oral lesions, sore throat.  GI: see HPI.  Heme/Lymph: No easy bruising.  CV: No chest pain.  GU: No hematuria.  Integumentary: No rashes.  Neuro: No headaches.  Psych: No depression/anxiety.  Endocrine: No heat/cold intolerance.  Allergic/Immunologic: No urticaria.  Resp: No cough, SOB.  Musculoskeletal: No joint swelling.    Physical Examination:  BP (!) 161/83   Pulse (!) 48   Temp 98.8 F (37.1 C) (Oral)   Resp 18   Ht 5' 4"  (1.626 m)   Wt 84 kg   SpO2 97%   BMI 31.79 kg/m  Gen: NAD, alert and oriented x 4 HEENT: PEERLA, EOMI, Neck: supple, no JVD or thyromegaly Chest: CTA bilaterally, no wheezes, crackles, or other adventitious sounds CV: RRR, no m/g/c/r Abd: soft, NT, ND, +BS in all four quadrants; no HSM, guarding, ridigity, or rebound tenderness Ext: no edema, well perfused with 2+ pulses, Skin: no rash or lesions noted Lymph: no LAD  Data: Lab Results  Component Value Date   WBC 4.9 06/25/2021   HGB 8.2 (L) 06/25/2021   HCT 27.7 (L) 06/25/2021   MCV 74.3 (L) 06/25/2021   PLT 136 (L) 06/25/2021   Recent Labs  Lab 06/24/21 2138 06/25/21 0339 06/25/21 0950  HGB 8.0* 7.9* 8.2*   Lab  Results  Component Value Date   NA 136 06/25/2021   K 4.0 06/25/2021   CL 105 06/25/2021   CO2 23 06/25/2021   BUN 11 06/25/2021   CREATININE 1.17 (H) 06/25/2021   Lab Results  Component Value Date   ALT 22 06/24/2021   AST 99 (H) 06/24/2021   ALKPHOS 67 06/24/2021   BILITOT 1.1 06/24/2021   Recent Labs  Lab 06/24/21 1036  APTT 33  INR 1.2    Assessment/Plan:  65 y/o AA female with a PMH of uncontrolled HTN, hypothyroidism, sarcoidosis, EtOH abuse, decompensated alcoholic cirrhosis of the liver c/b portal hypertension and esophageal varices s/p banding (last 03/2015), asthma, CHF, and anemia of chronic disease presented to the [facility] ED for progressive shortness of breath, chest pain, dry cough, and sweats x 4 days. GI consulted in context of symptomatic anemia and decompensated cirrhosis.   1. Symptomatic anemia/Iron-deficiency anemia - likely chronic GI blood loss versus anemia of chronic disease/malnutrition   2. Decompensated alcoholic cirrhosis of liver c/b portal hypertension and esophageal varices s/p banding 03/2015  3. Moderate portal hypertensive gastropathy  4. Chest pain - most likely 2/2 demand ischemia  5. Hypertensive urgency - 2/2 medication noncompliance   6. Medication noncompliance  Recommendations:  - Patient is currently hemodynamically stable with Hgb 8.2 s/p 1 unit pRBCs - History of variceal bleeding s/p banding on multiple occasions, last banding 03/2015 with Dr. Allen Norris during hospital admission - Based on labs, MELD 15 Child's Class A (5 pts) alcoholic cirrhosis  - We discussed importance of complete alcoholic cessation in order to prevent further hepatic decompensation - No overt gastrointestinal bleeding - Continue PPI for gastric protection - Begin octreotide for variceal protection  - Advise EGD for luminal evaluation to rule out any potential UGIB with close look at her esophageal varices and consider banding ligation - Remain NPO. Plan for  EGD this afternoon with Dr. Alice Reichert - Further recommendations after procedure - Following along with you - She will benefit from outpatient gastroenterology follow-up to complete [facility] surveillance and perform screening colonoscopy  I reviewed the risks (including bleeding, perforation, infection, anesthesia complications, cardiac/respiratory complications), benefits and alternatives of EGD. Patient consents to proceed.    Thank you for the consult. Please call with questions or concerns.  Reeves Forth [facility] [phone] (Cell)

## 2021-06-25 NOTE — Consult Note (Signed)
GI Inpatient Consult Note  Reason for Consult: Symptomatic anemia, alcoholic cirrhosis of the liver c/b esophageal varices and portal hypertension   Attending Requesting Consult:  History of Present Illness: Kathryn [name] is a 65 y.o. female seen for evaluation of symptomatic anemia and decompensated alcoholic cirrhosis of the liver c/b portal hypertension and Grade II esophageal varices with hemorrhage s/p banding 2016 at the request of Dr. Ivor Costa. Pt has a PMH of uncontrolled HTN, hypothyroidism, sarcoidosis, EtOH abuse, medication noncompliance, asthma, CHF, and anemia of chronic disease. She presented to the [facility] ED early this morning for chief complaint of progressive shortness of breath, sweats, dry cough, and chest pain over the past four days. She was also complaining of worsening low back pain x 1 week. Upon presentation to the ED, patient was found to have hypertensive urgency (BP 211/104), HR 69, RR 18, and O2 sats 100% on room air. Labs were significant for pancytopenia - WBC 3.0K, hemoglobin 7.2, and Plt 128K. Hemoglobin was 8.6 one year ago. LFTs were mildly elevated with AST 99, ALT 22, alk phos 67, and tbili 1.1. Anemia work-up was significant for iron-deficiency anemia with iron sat 7%, iron 34, TIBC 461, and ferritin 8. She was transfused 1 unit pRBCs, started on Protonix 40 mg PO BID, and admitted to the hospital. GI was consulted in context of symptomatic anemia and decompensated cirrhosis.   Patient seen in room today and examined. She reports she is feeling better. She denies any overt gastrointestinal blood loss. She has not had any hematemesis, coffee-ground emesis, hematochezia, or melena. She denies any recent changes in her bowel habits. Last BM was yesterday morning and was brown per patient. She has not followed up with her primary gastroenterologist since 2016. She was hospitalized in 2017 for symptomatic anemia and hematemesis where EGD was planned as inpatient was  ultimately cancelled due to hospital wide A/C issues. Plan was to perform EGD as outpatient but patient never followed up for procedure. She reports knowing about alcoholic cirrhosis for over 10 years. She is still currently drinking daily and can have 1-2 glasses of wine daily. She denies any jaundice, altered mental status, abdominal swelling, lower extremity edema, pruritus, nausea, or vomiting. She reports she is wanting to stop drinking and knows it is going to kill her if she doesn't. She would like a medication to take to make her stop drinking. She doesn't think she needs rehab. She reports she forgets to take a lot of her medications at home and has run out of most of her chronic medications.    Last EGD 03/2015: Impression:  - Grade II esophageal varices. Completely eradicated. Banded. - Portal hypertensive gastropathy. - Duodenitis. - No specimens collected.  Summary of other GI Procedures:  EGD 09/2014 - Grade I varices found at GEJ, mildly severe reflux esophagitis, portal hypertensive gastropathy, normal duodenum EGD 12/2012 - Grade II-III varices s/p banding EGD 08/2010 - Grade III esophageal varices s/p banding and PHG CSY 08/2010 - normal examined colon, internal hemorrhoids EGD 10/2008 - large varices with hemorrhage s/p banding   Past Medical History:  Past Medical History:  Diagnosis Date   Alcoholic cirrhosis of liver (HCC)    Asthma    CHF (congestive heart failure) ([place])    Chronic disease anemia    Esophageal varices ([place])    GR I on EGD by Dr Rayann Heman 09/2014   ETOH abuse    Hypertension    Hypothyroidism    Murmur  Portal hypertension ([place])    Sarcoidosis     Problem List: Patient Active Problem List   Diagnosis Date Noted   Symptomatic anemia 06/24/2021   Hypertensive urgency 06/24/2021   Asthma 06/24/2021   CKD (chronic kidney disease), stage IIIa 06/24/2021   Chronic CHF ([place]) 06/24/2021   Pancytopenia ([place]) 06/24/2021   Community acquired  pneumonia 11/20/2019   Pneumonia due to COVID-19 virus 11/20/2019   Anemia 06/12/2016   Hypoglycemia 02/11/2016   Hematemesis 04/04/2015   Cirrhosis ([place]) 04/04/2015   Esophageal varices ([place]) 04/04/2015   Hypothyroid 04/04/2015   Hypertension 04/04/2015   Chest pain 04/04/2015    Past Surgical History: Past Surgical History:  Procedure Laterality Date   ESOPHAGOGASTRODUODENOSCOPY (EGD) WITH PROPOFOL N/A 04/04/2015   Procedure: ESOPHAGOGASTRODUODENOSCOPY (EGD) WITH PROPOFOL;  Surgeon: Lucilla Lame, MD;  Location: [facility] ENDOSCOPY;  Service: Endoscopy;  Laterality: N/A;  Dr Allen Norris prefers 1445   ESOPHAGOGASTRODUODENOSCOPY W/ BANDING  09/2014   Rein   TUBAL LIGATION      Allergies: Allergies  Allergen Reactions   Hydrocodone-Acetaminophen Other (See Comments)    Reaction: pt can't take anything with tylenol due to her liver disease.    Ibuprofen Other (See Comments)    Esophageal varices   Naproxen Other (See Comments)    Esophageal varices   Propoxyphene Other (See Comments)    Reaction: pt isn't sure, but knows she can't take it.    Shellfish Allergy Hives   Tamiflu  [Oseltamivir Phosphate] Other (See Comments)    Esophageal varices    Home Medications: (Not in a hospital admission)  Home medication reconciliation was completed with the patient.   Scheduled Inpatient Medications:    amLODipine  10 mg Oral Daily   ferrous sulfate  325 mg Oral QODAY   folic acid  1 mg Oral Daily   levothyroxine  175 mcg Oral QAC breakfast   lidocaine  1 patch Transdermal Q24H   LORazepam  0-4 mg Intravenous Q6H   Followed by   Derrill Memo ON 06/26/2021] LORazepam  0-4 mg Intravenous Q12H   multivitamin with minerals  1 tablet Oral Daily   nadolol  20 mg Oral Daily   nicotine  21 mg Transdermal Daily   pantoprazole (PROTONIX) IV  40 mg Intravenous Q12H   spironolactone  12.5 mg Oral Daily   thiamine  100 mg Oral Daily   Or   thiamine  100 mg Intravenous Daily    Continuous Inpatient  Infusions:    PRN Inpatient Medications:  albuterol, dextromethorphan-guaiFENesin, hydrALAZINE, LORazepam **OR** LORazepam, morphine injection, ondansetron (ZOFRAN) IV, senna-docusate  Family History: family history includes Aneurysm in her father; COPD in her father; Heart attack in her mother.  The patient's family history is negative for inflammatory bowel disorders, GI malignancy, or solid organ transplantation.  Social History:   reports that she has never smoked. She has never used smokeless tobacco. She reports current alcohol use of about 15.0 standard drinks of alcohol per week. She reports that she does not use drugs. The patient denies ETOH, tobacco, or drug use.   Review of Systems: Constitutional: Weight is stable.  Eyes: No changes in vision. ENT: No oral lesions, sore throat.  GI: see HPI.  Heme/Lymph: No easy bruising.  CV: No chest pain.  GU: No hematuria.  Integumentary: No rashes.  Neuro: No headaches.  Psych: No depression/anxiety.  Endocrine: No heat/cold intolerance.  Allergic/Immunologic: No urticaria.  Resp: No cough, SOB.  Musculoskeletal: No joint swelling.    Physical Examination:  BP (!) 161/83   Pulse (!) 48   Temp 98.8 F (37.1 C) (Oral)   Resp 18   Ht 5' 4"  (1.626 m)   Wt 84 kg   SpO2 97%   BMI 31.79 kg/m  Gen: NAD, alert and oriented x 4 HEENT: PEERLA, EOMI, Neck: supple, no JVD or thyromegaly Chest: CTA bilaterally, no wheezes, crackles, or other adventitious sounds CV: RRR, no m/g/c/r Abd: soft, NT, ND, +BS in all four quadrants; no HSM, guarding, ridigity, or rebound tenderness Ext: no edema, well perfused with 2+ pulses, Skin: no rash or lesions noted Lymph: no LAD  Data: Lab Results  Component Value Date   WBC 4.9 06/25/2021   HGB 8.2 (L) 06/25/2021   HCT 27.7 (L) 06/25/2021   MCV 74.3 (L) 06/25/2021   PLT 136 (L) 06/25/2021   Recent Labs  Lab 06/24/21 2138 06/25/21 0339 06/25/21 0950  HGB 8.0* 7.9* 8.2*   Lab  Results  Component Value Date   NA 136 06/25/2021   K 4.0 06/25/2021   CL 105 06/25/2021   CO2 23 06/25/2021   BUN 11 06/25/2021   CREATININE 1.17 (H) 06/25/2021   Lab Results  Component Value Date   ALT 22 06/24/2021   AST 99 (H) 06/24/2021   ALKPHOS 67 06/24/2021   BILITOT 1.1 06/24/2021   Recent Labs  Lab 06/24/21 1036  APTT 33  INR 1.2    Assessment/Plan:  65 y/o AA female with a PMH of uncontrolled HTN, hypothyroidism, sarcoidosis, EtOH abuse, decompensated alcoholic cirrhosis of the liver c/b portal hypertension and esophageal varices s/p banding (last 03/2015), asthma, CHF, and anemia of chronic disease presented to the [facility] ED for progressive shortness of breath, chest pain, dry cough, and sweats x 4 days. GI consulted in context of symptomatic anemia and decompensated cirrhosis.   1. Symptomatic anemia/Iron-deficiency anemia - likely chronic GI blood loss versus anemia of chronic disease/malnutrition   2. Decompensated alcoholic cirrhosis of liver c/b portal hypertension and esophageal varices s/p banding 03/2015  3. Moderate portal hypertensive gastropathy  4. Chest pain - most likely 2/2 demand ischemia  5. Hypertensive urgency - 2/2 medication noncompliance   6. Medication noncompliance  Recommendations:  - Patient is currently hemodynamically stable with Hgb 8.2 s/p 1 unit pRBCs - History of variceal bleeding s/p banding on multiple occasions, last banding 03/2015 with Dr. Allen Norris during hospital admission - Based on labs, MELD 15 Child's Class A (5 pts) alcoholic cirrhosis  - We discussed importance of complete alcoholic cessation in order to prevent further hepatic decompensation - No overt gastrointestinal bleeding - Continue PPI for gastric protection - Begin octreotide for variceal protection  - Advise EGD for luminal evaluation to rule out any potential UGIB with close look at her esophageal varices and consider banding ligation - Remain NPO. Plan for  EGD this afternoon with Dr. Alice Reichert - Further recommendations after procedure - Following along with you - She will benefit from outpatient gastroenterology follow-up to complete [facility] surveillance and perform screening colonoscopy  I reviewed the risks (including bleeding, perforation, infection, anesthesia complications, cardiac/respiratory complications), benefits and alternatives of EGD. Patient consents to proceed.    Thank you for the consult. Please call with questions or concerns.  Reeves Forth [facility] [phone] (Cell)

## 2021-06-25 NOTE — ED Notes (Signed)
Pt updated on plan for EGD tomorrow AM and verbalizes understanding to remain NPO after midnight.

## 2021-06-25 NOTE — ED Notes (Signed)
Pt resting quietly in bed, watching TV. Pt denies any current needs or requests. No S/S of distress noted. Call light within reach.

## 2021-06-26 ENCOUNTER — Other Ambulatory Visit: Payer: Self-pay

## 2021-06-26 ENCOUNTER — Encounter
Admission: EM | Disposition: A | Discharge status: Home / Self Care | Payer: Self-pay | Source: Home / Self Care | Attending: Internal Medicine

## 2021-06-26 ENCOUNTER — Encounter: Payer: Self-pay | Admitting: Internal Medicine

## 2021-06-26 ENCOUNTER — Inpatient Hospital Stay: Payer: Medicare Other | Admitting: Anesthesiology

## 2021-06-26 DIAGNOSIS — I160 Hypertensive urgency: Secondary | ICD-10-CM | POA: Diagnosis present

## 2021-06-26 DIAGNOSIS — N1831 Chronic kidney disease, stage 3a: Secondary | ICD-10-CM | POA: Diagnosis present

## 2021-06-26 DIAGNOSIS — D72819 Decreased white blood cell count, unspecified: Secondary | ICD-10-CM | POA: Diagnosis present

## 2021-06-26 DIAGNOSIS — D6959 Other secondary thrombocytopenia: Secondary | ICD-10-CM | POA: Diagnosis present

## 2021-06-26 DIAGNOSIS — R001 Bradycardia, unspecified: Secondary | ICD-10-CM | POA: Diagnosis not present

## 2021-06-26 DIAGNOSIS — Z8249 Family history of ischemic heart disease and other diseases of the circulatory system: Secondary | ICD-10-CM | POA: Diagnosis not present

## 2021-06-26 DIAGNOSIS — K3189 Other diseases of stomach and duodenum: Secondary | ICD-10-CM | POA: Diagnosis present

## 2021-06-26 DIAGNOSIS — Z8701 Personal history of pneumonia (recurrent): Secondary | ICD-10-CM | POA: Diagnosis not present

## 2021-06-26 DIAGNOSIS — I248 Other forms of acute ischemic heart disease: Secondary | ICD-10-CM | POA: Diagnosis present

## 2021-06-26 DIAGNOSIS — K7030 Alcoholic cirrhosis of liver without ascites: Secondary | ICD-10-CM | POA: Diagnosis present

## 2021-06-26 DIAGNOSIS — Z20822 Contact with and (suspected) exposure to covid-19: Secondary | ICD-10-CM | POA: Diagnosis present

## 2021-06-26 DIAGNOSIS — I5032 Chronic diastolic (congestive) heart failure: Secondary | ICD-10-CM | POA: Diagnosis present

## 2021-06-26 DIAGNOSIS — D649 Anemia, unspecified: Secondary | ICD-10-CM | POA: Diagnosis present

## 2021-06-26 DIAGNOSIS — E039 Hypothyroidism, unspecified: Secondary | ICD-10-CM | POA: Diagnosis present

## 2021-06-26 DIAGNOSIS — J45909 Unspecified asthma, uncomplicated: Secondary | ICD-10-CM | POA: Diagnosis present

## 2021-06-26 DIAGNOSIS — D869 Sarcoidosis, unspecified: Secondary | ICD-10-CM | POA: Diagnosis present

## 2021-06-26 DIAGNOSIS — I8511 Secondary esophageal varices with bleeding: Secondary | ICD-10-CM | POA: Diagnosis present

## 2021-06-26 DIAGNOSIS — D638 Anemia in other chronic diseases classified elsewhere: Secondary | ICD-10-CM | POA: Diagnosis present

## 2021-06-26 DIAGNOSIS — F1010 Alcohol abuse, uncomplicated: Secondary | ICD-10-CM | POA: Diagnosis present

## 2021-06-26 DIAGNOSIS — K766 Portal hypertension: Secondary | ICD-10-CM | POA: Diagnosis present

## 2021-06-26 DIAGNOSIS — Z825 Family history of asthma and other chronic lower respiratory diseases: Secondary | ICD-10-CM | POA: Diagnosis not present

## 2021-06-26 DIAGNOSIS — D500 Iron deficiency anemia secondary to blood loss (chronic): Secondary | ICD-10-CM | POA: Diagnosis present

## 2021-06-26 DIAGNOSIS — T447X6A Underdosing of beta-adrenoreceptor antagonists, initial encounter: Secondary | ICD-10-CM | POA: Diagnosis present

## 2021-06-26 DIAGNOSIS — T500X6A Underdosing of mineralocorticoids and their antagonists, initial encounter: Secondary | ICD-10-CM | POA: Diagnosis present

## 2021-06-26 DIAGNOSIS — I130 Hypertensive heart and chronic kidney disease with heart failure and stage 1 through stage 4 chronic kidney disease, or unspecified chronic kidney disease: Secondary | ICD-10-CM | POA: Diagnosis present

## 2021-06-26 HISTORY — PX: ESOPHAGOGASTRODUODENOSCOPY: SHX5428

## 2021-06-26 LAB — ECHOCARDIOGRAM COMPLETE
Area-P 1/2: 2.48 cm2
Height: 64 in
S' Lateral: 2 cm
Weight: 2962.98 oz

## 2021-06-26 LAB — CBC
HCT: 30.1 % — ABNORMAL LOW (ref 36.0–46.0)
Hemoglobin: 9.2 g/dL — ABNORMAL LOW (ref 12.0–15.0)
MCH: 22.4 pg — ABNORMAL LOW (ref 26.0–34.0)
MCHC: 30.6 g/dL (ref 30.0–36.0)
MCV: 73.4 fL — ABNORMAL LOW (ref 80.0–100.0)
Platelets: 144 10*3/uL — ABNORMAL LOW (ref 150–400)
RBC: 4.1 MIL/uL (ref 3.87–5.11)
RDW: 23.9 % — ABNORMAL HIGH (ref 11.5–15.5)
WBC: 4.3 10*3/uL (ref 4.0–10.5)
nRBC: 0.9 % — ABNORMAL HIGH (ref 0.0–0.2)

## 2021-06-26 LAB — CBG MONITORING, ED: Glucose-Capillary: 111 mg/dL — ABNORMAL HIGH (ref 70–99)

## 2021-06-26 SURGERY — EGD (ESOPHAGOGASTRODUODENOSCOPY)
Anesthesia: General

## 2021-06-26 MED ORDER — PROPOFOL 500 MG/50ML IV EMUL
INTRAVENOUS | Status: DC | PRN
  Administered 2021-06-26: 120 ug/kg/min via INTRAVENOUS

## 2021-06-26 MED ORDER — PROPOFOL 500 MG/50ML IV EMUL
INTRAVENOUS | Status: AC
  Filled 2021-06-26: qty 50

## 2021-06-26 MED ORDER — SODIUM CHLORIDE 0.9 % IV SOLN: INTRAVENOUS | Status: DC

## 2021-06-26 MED ORDER — LIDOCAINE HCL (PF) 2 % IJ SOLN
INTRAMUSCULAR | Status: AC
  Filled 2021-06-26: qty 5

## 2021-06-26 MED ORDER — LIDOCAINE HCL (CARDIAC) PF 100 MG/5ML IV SOSY
PREFILLED_SYRINGE | INTRAVENOUS | Status: DC | PRN
  Administered 2021-06-26: 50 mg via INTRAVENOUS

## 2021-06-26 NOTE — Transfer of Care (Signed)
Immediate Anesthesia Transfer of Care Note  Patient: Kathryn [name]  Procedure(s) Performed: ESOPHAGOGASTRODUODENOSCOPY (EGD)  Patient Location: PACU  Anesthesia Type:General  Level of Consciousness: awake and sedated  Airway & Oxygen Therapy: Patient Spontanous Breathing and Patient connected to nasal cannula oxygen  Post-op Assessment: Report given to RN and Post -op Vital signs reviewed and stable  Post vital signs: Reviewed and stable  Last Vitals:  Vitals Value Taken Time  BP    Temp    Pulse    Resp    SpO2      Last Pain:  Vitals:   06/26/21 1128  TempSrc: Temporal  PainSc: 0-No pain         Complications: No notable events documented.

## 2021-06-26 NOTE — Anesthesia Procedure Notes (Signed)
Date/Time: 06/26/2021 12:22 PM Performed by: Tonia Ghent Pre-anesthesia Checklist: Patient identified and Emergency Drugs available Patient Re-evaluated:Patient Re-evaluated prior to induction Oxygen Delivery Method: Nasal cannula Preoxygenation: Pre-oxygenation with 100% oxygen Induction Type: IV induction Airway Equipment and Method: Bite block Placement Confirmation: positive ETCO2 and CO2 detector

## 2021-06-26 NOTE — Op Note (Signed)
[facility] Patient Name: Kathryn [name] Procedure Date: 06/26/2021 12:08 PM MRN: [id number] Account #: [id number] Date of Birth: [birth year] Admit Type: Inpatient Age: 65 Room: [facility] ENDO ROOM 2 Gender: Female Note Status: Finalized Procedure:             Upper GI endoscopy Indications:           Iron deficiency anemia secondary to chronic blood                         loss, Heme positive stool Providers:             Boykin Nearing. Norma Fredrickson MD, MD Referring MD:          Pollyann Samples. Mancheno Revelo (Referring MD) Medicines:             Propofol per Anesthesia Complications:         No immediate complications. Procedure:             Pre-Anesthesia Assessment:                        - The risks and benefits of the procedure and the                         sedation options and risks were discussed with the                         patient. All questions were answered and informed                         consent was obtained.                        - Patient identification and proposed procedure were                         verified prior to the procedure by the nurse. The                         procedure was verified in the procedure room.                        - ASA Grade Assessment: III - A patient with severe                         systemic disease.                        - After reviewing the risks and benefits, the patient                         was deemed in satisfactory condition to undergo the                         procedure.                        After obtaining informed consent, the endoscope was                         passed under direct  vision. Throughout the procedure,                         the patient's blood pressure, pulse, and oxygen                         saturations were monitored continuously. The Endoscope                         was introduced through the mouth, and advanced to the                         third part of duodenum. The  upper GI endoscopy was                         accomplished without difficulty. The patient tolerated                         the procedure well. Findings:      Grade III varices were found in the lower third of the esophagus. Two       bands were successfully placed with complete eradication, resulting in       deflation of varices. There was no bleeding during and at the end of the       procedure.      Severe portal hypertensive gastropathy was found in the entire examined       stomach. Biopsies were taken with a cold forceps for histology.      There is no endoscopic evidence of varices in the cardia.      The examined duodenum was normal.      The exam was otherwise without abnormality. Impression:            - Grade III esophageal varices. Completely eradicated.                         Banded.                        - Portal hypertensive gastropathy. Biopsied.                        - Normal examined duodenum.                        - The examination was otherwise normal. Recommendation:        - Return patient to hospital ward for observation.                        - Full liquid diet. Procedure Code(s):     --- Professional ---                        [phone], Esophagogastroduodenoscopy, flexible,                         transoral; with band ligation of esophageal/gastric                         varices  69629, Esophagogastroduodenoscopy, flexible,                         transoral; with biopsy, single or multiple Diagnosis Code(s):     --- Professional ---                        R19.5, Other fecal abnormalities                        D50.0, Iron deficiency anemia secondary to blood loss                         (chronic)                        K31.89, Other diseases of stomach and duodenum                        K76.6, Portal hypertension                        I85.00, Esophageal varices without bleeding CPT copyright 2019 American Medical Association. All  rights reserved. The codes documented in this report are preliminary and upon coder review may  be revised to meet current compliance requirements. Stanton Kidney MD, MD 06/26/2021 12:32:15 PM This report has been signed electronically. Number of Addenda: 0 Note Initiated On: 06/26/2021 12:08 PM Estimated Blood Loss:  Estimated blood loss: none.      [facility]

## 2021-06-26 NOTE — Anesthesia Postprocedure Evaluation (Signed)
Anesthesia Post Note  Patient: Kathryn [name]  Procedure(s) Performed: ESOPHAGOGASTRODUODENOSCOPY (EGD)  Patient location during evaluation: Endoscopy Anesthesia Type: General Level of consciousness: awake and alert Pain management: pain level controlled Vital Signs Assessment: post-procedure vital signs reviewed and stable Respiratory status: spontaneous breathing, nonlabored ventilation, respiratory function stable and patient connected to nasal cannula oxygen Cardiovascular status: blood pressure returned to baseline and stable Postop Assessment: no apparent nausea or vomiting Anesthetic complications: no   No notable events documented.   Last Vitals:  Vitals:   06/26/21 1302 06/26/21 1427  BP: (!) 125/93 (!) 131/93  Pulse: (!) 48 (!) 55  Resp: 11 18  Temp:  36.4 C  SpO2: 100% 100%    Last Pain:  Vitals:   06/26/21 1420  TempSrc:   PainSc: 0-No pain                 Lenard Simmer

## 2021-06-26 NOTE — ED Notes (Signed)
Vital signs stable. 

## 2021-06-26 NOTE — ED Notes (Signed)
Patient denies pain and is resting comfortably.  

## 2021-06-26 NOTE — Interval H&P Note (Signed)
History and Physical Interval Note:  06/26/2021 12:33 PM  Kathryn [name]  has presented today for surgery, with the diagnosis of SYMPTOMATIC ANEMIA ESOPHAGEAL VARICES IN CIRRHOSIS.  The various methods of treatment have been discussed with the patient and family. After consideration of risks, benefits and other options for treatment, the patient has consented to  Procedure(s): ESOPHAGOGASTRODUODENOSCOPY (EGD) (N/A) as a surgical intervention.  The patient's history has been reviewed, patient examined, no change in status, stable for surgery.  I have reviewed the patient's chart and labs.  Questions were answered to the patient's satisfaction.     [place], [place]

## 2021-06-26 NOTE — Plan of Care (Addendum)
Notified Dr. Rennis Petty HR sustaining in 40s.  Verbal ok to hold nadolol.

## 2021-06-26 NOTE — ED Notes (Signed)
Patient is resting comfortably. 

## 2021-06-26 NOTE — Anesthesia Preprocedure Evaluation (Addendum)
Anesthesia Evaluation  Patient identified by MRN, date of birth, ID band Patient awake    Reviewed: Allergy & Precautions, NPO status , Patient's Chart, lab work & pertinent test results, reviewed documented beta blocker date and time   History of Anesthesia Complications Negative for: history of anesthetic complications  Airway Mallampati: III  TM Distance: >3 FB Neck ROM: Full    Dental  (+) Upper Dentures, Lower Dentures, Dental Advidsory Given   Pulmonary shortness of breath and with exertion, asthma , neg recent URI,    Pulmonary exam normal breath sounds clear to auscultation       Cardiovascular Exercise Tolerance: Poor hypertension, Pt. on medications (-) angina+CHF  (-) Past MI and (-) Cardiac Stents Normal cardiovascular exam(-) dysrhythmias + Valvular Problems/Murmurs  Rhythm:Regular Rate:Normal     Neuro/Psych Seizures - (one time, but none since), Well Controlled,  PSYCHIATRIC DISORDERS    GI/Hepatic GERD  ,(+) Cirrhosis   Esophageal Varices    ,   Endo/Other  neg diabetesHypothyroidism   Renal/GU CRFRenal disease  negative genitourinary   Musculoskeletal negative musculoskeletal ROS (+)   Abdominal   Peds negative pediatric ROS (+)  Hematology  (+) Blood dyscrasia, anemia ,   Anesthesia Other Findings Past Medical History: No date: Alcoholic cirrhosis of liver (HCC) No date: Asthma No date: CHF (congestive heart failure) (HCC) No date: Chronic disease anemia No date: Esophageal varices (HCC)     Comment:  GR I on EGD by Dr Shelle Iron 09/2014 No date: ETOH abuse No date: Hypertension No date: Hypothyroidism No date: Murmur No date: Portal hypertension (HCC) No date: Sarcoidosis   Reproductive/Obstetrics negative OB ROS                            Anesthesia Physical  Anesthesia Plan  ASA: 4  Anesthesia Plan: General   Post-op Pain Management:    Induction:  Intravenous  PONV Risk Score and Plan: 3 and TIVA and Propofol infusion  Airway Management Planned: Nasal Cannula and Natural Airway  Additional Equipment:   Intra-op Plan:   Post-operative Plan:   Informed Consent: I have reviewed the patients History and Physical, chart, labs and discussed the procedure including the risks, benefits and alternatives for the proposed anesthesia with the patient or authorized representative who has indicated his/her understanding and acceptance.       Plan Discussed with: CRNA and Surgeon  Anesthesia Plan Comments:         Anesthesia Quick Evaluation

## 2021-06-26 NOTE — Progress Notes (Addendum)
PROGRESS NOTE    Kathryn [name]  WUJ:[id number] DOB: November 17, 1956 DOA: 06/24/2021 PCP: Preston Fleeting, MD    Brief Narrative:  Kathryn [name] is a 65 y.o. female with medical history significant of alcohol abuse, alcoholic liver cirrhosis, portal vein hypertension, esophageal varices, hypertension, asthma, hypothyroidism, sarcoidosis, anemia, CHF (not 2D echo on record), CKD stage IIIa, who presents with shortness breath, generalized weakness. Patient states that she has shortness breath and generalized weakness for more than 1 week. 8/4- s/p scope today  Consultants:  GI  Procedures:   Antimicrobials:      Subjective: Reports feelign better. Bradycardic ealier nadolol held  Objective: Vitals:   06/26/21 1250 06/26/21 1300 06/26/21 1302 06/26/21 1427  BP:   (!) 125/93 (!) 131/93  Pulse: (!) 35 (!) 49 (!) 48 (!) 55  Resp: Temp:    97.6 F (36.4 C)  TempSrc:      SpO2: (!) 89% 100% 100% 100%  Weight:    80.4 kg  Height:     (1.626 m)    Intake/Output Summary (Last 24 hours) at 06/26/2021 1549 Last data filed at 06/26/2021 1211 Gross per 24 hour  Intake --  Output 0 ml  Net 0 ml   Filed Weights   06/24/21 2337 06/26/21 1427  Weight: 84 kg 80.4 kg    Examination: Nad, calm  Cta  no w/r Regular s1/s2 no gallop Soft benign +bs Mild edema  [id number]    Data Reviewed: I have personally reviewed following labs and imaging studies  CBC: Recent Labs  Lab 06/24/21 2138 06/25/21 0339 06/25/21 0950 06/25/21 1538 06/26/21 0641  WBC 5.1 4.6 4.9 6.3 4.3  HGB 8.0* 7.9* 8.2* 9.7* 9.2*  HCT 25.6* 25.9* 27.7* 32.9* 30.1*  MCV 71.5* 71.9* 74.3* 72.1* 73.4*  PLT 124* 126* 136* 173 144*   Basic Metabolic Panel: Recent Labs  Lab 06/24/21 0351 06/25/21 0339  NA 134* 136  K 3.8 4.0  CL 106 105  CO2 21* 23  GLUCOSE 91 85  BUN 14 11  CREATININE 1.23* 1.17*  CALCIUM 8.3* 8.7*   GFR: Estimated Creatinine Clearance: 49.8 mL/min (A) (by C-G  formula based on SCr of 1.17 mg/dL (H)). Liver Function Tests: Recent Labs  Lab 06/24/21 0351  AST 99*  ALT 22  ALKPHOS 67  BILITOT 1.1  PROT 8.2*  ALBUMIN 3.7   No results for input(s): LIPASE, AMYLASE in the last 168 hours. No results for input(s): AMMONIA in the last 168 hours. Coagulation Profile: Recent Labs  Lab 06/24/21 1036  INR 1.2   Cardiac Enzymes: No results for input(s): CKTOTAL, CKMB, CKMBINDEX, TROPONINI in the last 168 hours. BNP (last 3 results) No results for input(s): PROBNP in the last 8760 hours. HbA1C: Recent Labs    06/24/21 1630  HGBA1C 5.3   CBG: Recent Labs  Lab 06/25/21 0953 06/26/21 0842  GLUCAP 105* 111*   Lipid Profile: Recent Labs    06/25/21 0339  CHOL 232*  HDL 108  LDLCALC 111*  TRIG 67  CHOLHDL 2.1   Thyroid Function Tests: No results for input(s): TSH, T4TOTAL, FREET4, T3FREE, THYROIDAB in the last 72 hours. Anemia Panel: Recent Labs    06/24/21 0321 06/24/21 0351 06/24/21 1036  VITAMINB12  --   --  447  FOLATE  --  5.0*  --   FERRITIN  --  8*  --   TIBC  --  461*  --   IRON  --  34  --   RETICCTPCT 1.0  --   --    Sepsis Labs: No results for input(s): PROCALCITON, LATICACIDVEN in the last 168 hours.  Recent Results (from the past 240 hour(s))  Resp Panel by RT-PCR (Flu A&B, Covid) Nasopharyngeal Swab     Status: None   Collection Time: 06/24/21 11:02 AM   Specimen: Nasopharyngeal Swab; Nasopharyngeal(NP) swabs in vial transport medium  Result Value Ref Range Status   SARS Coronavirus 2 by RT PCR NEGATIVE NEGATIVE Final    Comment: (NOTE) SARS-CoV-2 target nucleic acids are NOT DETECTED.  The SARS-CoV-2 RNA is generally detectable in upper respiratory specimens during the acute phase of infection. The lowest concentration of SARS-CoV-2 viral copies this assay can detect is 138 copies/mL. A negative result does not preclude SARS-Cov-2 infection and should not be used as the sole basis for treatment  or other patient management decisions. A negative result may occur with  improper specimen collection/handling, submission of specimen other than nasopharyngeal swab, presence of viral mutation(s) within the areas targeted by this assay, and inadequate number of viral copies(<138 copies/mL). A negative result must be combined with clinical observations, patient history, and epidemiological information. The expected result is Negative.  Fact Sheet for Patients:  BloggerCourse.com  Fact Sheet for Healthcare Providers:  SeriousBroker.it  This test is no t yet approved or cleared by the [place] FDA and  has been authorized for detection and/or diagnosis of SARS-CoV-2 by FDA under an Emergency Use Authorization (EUA). This EUA will remain  in effect (meaning this test can be used) for the duration of the COVID-19 declaration under Section 564(b)(1) of the Act, 21 U.S.C.section 360bbb-3(b)(1), unless the authorization is terminated  or revoked sooner.       Influenza A by PCR NEGATIVE NEGATIVE Final   Influenza B by PCR NEGATIVE NEGATIVE Final    Comment: (NOTE) The Xpert Xpress SARS-CoV-2/FLU/RSV plus assay is intended as an aid in the diagnosis of influenza from Nasopharyngeal swab specimens and should not be used as a sole basis for treatment. Nasal washings and aspirates are unacceptable for Xpert Xpress SARS-CoV-2/FLU/RSV testing.  Fact Sheet for Patients: BloggerCourse.com  Fact Sheet for Healthcare Providers: SeriousBroker.it  This test is not yet approved or cleared by the [place] FDA and has been authorized for detection and/or diagnosis of SARS-CoV-2 by FDA under an Emergency Use Authorization (EUA). This EUA will remain in effect (meaning this test can be used) for the duration of the COVID-19 declaration under Section 564(b)(1) of the Act, 21 U.S.C. section  360bbb-3(b)(1), unless the authorization is terminated or revoked.  Performed at [facility], [address], [place], [place] [address]          Radiology Studies: ECHOCARDIOGRAM COMPLETE  Result Date: 06/26/2021    ECHOCARDIOGRAM REPORT   Patient Name:   Kathryn [name] Date of Exam: 06/25/2021 Medical Rec #:  [id number]    Height:       64.0 in Accession #:    [id number]   Weight:       185.2 lb Date of Birth:  December 15, 1955    BSA:          1.893 m Patient Age:    64 years     BP:           165/98 mmHg Patient Gender: F            HR:           53 bpm.  Exam Location:  [facility] Procedure: 2D Echo, Cardiac Doppler and Color Doppler Indications:     I50.31 Acute Diastolic CHF  History:         Patient has no prior history of Echocardiogram examinations.                  Risk Factors:Hypertension. Alcoholic cirrhosis of liver.                  Congestive heart failure. Murmur. Hypothyroidism. Sarcoidosis.  Sonographer:     [place] SmallNaTashia Rodgers-Jones Referring Phys:  [id number] Lynn ItoSAHAR Everlena Mackley Diagnosing Phys: Julien Nordmannimothy Gollan MD IMPRESSIONS  1. Left ventricular ejection fraction, by estimation, is 60 to 65%. The left ventricle has normal function. The left ventricle has no regional wall motion abnormalities. There is moderate left ventricular hypertrophy. Left ventricular diastolic parameters are consistent with Grade I diastolic dysfunction (impaired relaxation).  2. Right ventricular systolic function is normal. The right ventricular size is normal.  3. Left atrial size was moderately dilated.  4. The mitral valve is normal in structure. Mild to moderate mitral valve regurgitation. FINDINGS  Left Ventricle: Left ventricular ejection fraction, by estimation, is 60 to 65%. The left ventricle has normal function. The left ventricle has no regional wall motion abnormalities. The left ventricular internal cavity size was normal in size. There is  moderate left ventricular hypertrophy. Left ventricular diastolic  parameters are consistent with Grade I diastolic dysfunction (impaired relaxation). Right Ventricle: The right ventricular size is normal. No increase in right ventricular wall thickness. Right ventricular systolic function is normal. Left Atrium: Left atrial size was moderately dilated. Right Atrium: Right atrial size was normal in size. Pericardium: There is no evidence of pericardial effusion. Mitral Valve: The mitral valve is normal in structure. Mild to moderate mitral valve regurgitation. No evidence of mitral valve stenosis. Tricuspid Valve: The tricuspid valve is normal in structure. Tricuspid valve regurgitation is mild . No evidence of tricuspid stenosis. Aortic Valve: The aortic valve is normal in structure. Aortic valve regurgitation is not visualized. No aortic stenosis is present. Pulmonic Valve: The pulmonic valve was normal in structure. Pulmonic valve regurgitation is not visualized. No evidence of pulmonic stenosis. Aorta: The aortic root is normal in size and structure. Venous: The inferior vena cava is normal in size with greater than 50% respiratory variability, suggesting right atrial pressure of 3 mmHg. IAS/Shunts: No atrial level shunt detected by color flow Doppler.  LEFT VENTRICLE PLAX 2D LVIDd:         3.90 cm  Diastology LVIDs:         2.00 cm  LV e' medial:    4.79 cm/s LV PW:         1.20 cm  LV E/e' medial:  15.5 LV IVS:        1.40 cm  LV e' lateral:   5.55 cm/s LVOT diam:     1.90 cm  LV E/e' lateral: 13.4 LV SV:         98 LV SV Index:   52 LVOT Area:     2.84 cm  RIGHT VENTRICLE RV Basal diam:  3.40 cm RV S prime:     16.00 cm/s TAPSE (M-mode): 3.0 cm LEFT ATRIUM             Index       RIGHT ATRIUM           Index LA diam:        4.90 cm 2.59 cm/m  RA Area:     14.10 cm LA Vol (A2C):   55.1 ml 29.10 ml/m RA Volume:   37.40 ml  19.75 ml/m LA Vol (A4C):   62.0 ml 32.74 ml/m LA Biplane Vol: 60.8 ml 32.11 ml/m  AORTIC VALVE LVOT Vmax:   131.00 cm/s LVOT Vmean:  105.000 cm/s  LVOT VTI:    0.347 m  AORTA Ao Root diam: 2.70 cm Ao Asc diam:  3.10 cm MITRAL VALVE               TRICUSPID VALVE MV Area (PHT): 2.48 cm    TR Peak grad:   13.5 mmHg MV Decel Time: 306 msec    TR Vmax:        184.00 cm/s MV E velocity: 74.10 cm/s MV A velocity: 77.10 cm/s  SHUNTS MV E/A ratio:  0.96        Systemic VTI:  0.35 m                            Systemic Diam: 1.90 cm Julien Nordmann MD Electronically signed by Julien Nordmann MD Signature Date/Time: 06/26/2021/2:59:10 PM    Final         Scheduled Meds:  amLODipine  10 mg Oral Daily   ferrous sulfate  325 mg Oral QODAY   folic acid  1 mg Oral Daily   levothyroxine  175 mcg Oral QAC breakfast   lidocaine  1 patch Transdermal Q24H   LORazepam  0-4 mg Intravenous Q12H   multivitamin with minerals  1 tablet Oral Daily   nadolol  20 mg Oral Daily   nicotine  21 mg Transdermal Daily   pantoprazole (PROTONIX) IV  40 mg Intravenous Q12H   spironolactone  12.5 mg Oral Daily   thiamine  100 mg Oral Daily   Or   thiamine  100 mg Intravenous Daily   Continuous Infusions:  octreotide  (SANDOSTATIN)    IV infusion 50 mcg/hr (06/26/21 0428)    Assessment & Plan:   Principal Problem:   Symptomatic anemia Active Problems:   Cirrhosis (HCC)   Hypothyroid   Hypertension   Chest pain   Hypertensive urgency   Asthma   CKD (chronic kidney disease), stage IIIa   Chronic CHF (HCC)   Pancytopenia (HCC)   Symptomatic anemia Iron deficiency: Patient has history of liver cirrhosis with portal hypertension and esophageal varices,  at high risk for GI bleeding, but patient denies dark stool or rectal bleeding.  8/3- s/p PRBC 1 unit  GI consulted as pt told me has not seen GI for a long time. 8/4-s/p scope Receivied iv iron Continue PPI    Pancytopenia:  2/2 alcohol   Cirrhosis (HCC): -Patient is on nadolol and spironolactone, but not taking these medications currently 8/4- on aldacontine and nadolol  Nadolol not given due to  bradycardia Continue octreotide Gi following      Hypothyroid Continue synthroid   Hypertension and hypertensive urgency: Blood pressure 211/104, this is due to medication noncompliance Startd on amlodipine Continue nadolol and spironolactone     Chest pain: Most likely due to demand ischemia.  Troponin 11. -Blood pressure control -Blood transfusion as above -Check A1c, FLP   Asthma: -As needed albuterol   CKD (chronic kidney disease), stage IIIa: Stable.  Recent baseline creatinine 1.35 on 04/14/2020.  Her creatinine is 1.23, BUN 14. -Follow-up renal function by BMP   Chronic CHF (HCC): No 2D echo on record,  not sure which type of CHF.  Patient has a long history of uncontrolled hypertension, likely has diastolic congestive heart failure.  Patient has 1+ leg edema, but no, JVD, no oxygen desaturation, no pulmonary on chest x-ray, CHF seem to be compensated. Will ck echo         DVT prophylaxis: scd Code Status:full Family Communication: none at bedside Disposition Plan:  Status is: inpatient  The patient is inpatient as she requires IV treatment and severity of her illness requires hospitalization  Dispo: The patient is from: Home              Anticipated d/c is to: Home              Patient currently is not medically stable to d/c.   Difficult to place patient No            LOS: 0 days   Time spent: 35 minutes more than 50% on COC    Lynn Ito, MD [facility] Pager [phone]  If 7PM-7AM, please contact night-coverage 06/26/2021, 3:49 PM

## 2021-06-27 DIAGNOSIS — D649 Anemia, unspecified: Secondary | ICD-10-CM | POA: Diagnosis not present

## 2021-06-27 MED ORDER — PANTOPRAZOLE SODIUM 40 MG PO TBEC: 40.0000 mg | DELAYED_RELEASE_TABLET | Freq: Two times a day (BID) | ORAL | 0 refills | Status: DC

## 2021-06-27 MED ORDER — NADOLOL 20 MG PO TABS: 20.0000 mg | ORAL_TABLET | Freq: Every day | ORAL | 0 refills | Status: DC

## 2021-06-27 MED ORDER — LEVOTHYROXINE SODIUM 175 MCG PO TABS: 175.0000 ug | ORAL_TABLET | Freq: Every day | ORAL | 0 refills | Status: DC

## 2021-06-27 MED ORDER — SPIRONOLACTONE 25 MG PO TABS: 12.5000 mg | ORAL_TABLET | Freq: Every day | ORAL | 0 refills | Status: DC

## 2021-06-27 MED ORDER — THIAMINE HCL 100 MG PO TABS: 100.0000 mg | ORAL_TABLET | Freq: Every day | ORAL | 0 refills | Status: AC

## 2021-06-27 MED ORDER — PANTOPRAZOLE SODIUM 40 MG PO TBEC: 40.0000 mg | DELAYED_RELEASE_TABLET | Freq: Two times a day (BID) | ORAL | Status: DC

## 2021-06-27 MED ORDER — AMLODIPINE BESYLATE 10 MG PO TABS: 10.0000 mg | ORAL_TABLET | Freq: Every day | ORAL | 0 refills | Status: DC

## 2021-06-27 NOTE — TOC Initial Note (Signed)
Transition of Care [facility]) - Initial/Assessment Note    Patient Details  Name: Kathryn [name] MRN: [id number] Date of Birth: May 28, 1956  Transition of Care [facility]) CM/SW Contact:    Gildardo Griffes, LCSW Phone Number: 06/27/2021, 1:52 PM  Clinical Narrative:                  CSW spoke with patient regarding discharge planning, offered substance abuser resources which patient declines for now. Reports she has her drinking under control.   Patient reports she continue to see her PCP Dr. Ephraim Hamburger and her brother sometimes assists her with transportation as well as [employer]. Reports she lost the phone number to [facility], CSW to provide in room.   Patient reports she uses the [facility] in [place] for her meds, occasional barriers to getting her medication include cost and transportation issues.   Patient reports her brother will pick her up today.   No questions concerns at this time, CSW to drop off [employer] information to patient room so she can resume transportation services.   Expected Discharge Plan: Home/Self Care Barriers to Discharge: No Barriers Identified   Patient Goals and CMS Choice Patient states their goals for this hospitalization and ongoing recovery are:: to go home CMS Medicare.gov Compare Post Acute Care list provided to:: Patient Choice offered to / list presented to : Patient  Expected Discharge Plan and Services Expected Discharge Plan: Home/Self Care       Living arrangements for the past 2 months: Single Family Home Expected Discharge Date: 06/27/21                                    Prior Living Arrangements/Services Living arrangements for the past 2 months: Single Family Home Lives with:: Self Patient language and need for interpreter reviewed:: Yes Do you feel safe going back to the place where you live?: Yes      Need for Family Participation in Patient Care: Yes (Comment) Care giver support system in place?: Yes (comment)   Criminal  Activity/Legal Involvement Pertinent to Current Situation/Hospitalization: No - Comment as needed  Activities of Daily Living Home Assistive Devices/Equipment: None ADL Screening (condition at time of admission) Patient's cognitive ability adequate to safely complete daily activities?: Yes Is the patient deaf or have difficulty hearing?: No Does the patient have difficulty seeing, even when wearing glasses/contacts?: No Does the patient have difficulty concentrating, remembering, or making decisions?: No Patient able to express need for assistance with ADLs?: Yes Does the patient have difficulty dressing or bathing?: No Independently performs ADLs?: Yes (appropriate for developmental age) Does the patient have difficulty walking or climbing stairs?: No Weakness of Legs: None Weakness of Arms/Hands: None  Permission Sought/Granted                  Emotional Assessment Appearance:: Appears stated age Attitude/Demeanor/Rapport: Gracious Affect (typically observed): Calm Orientation: : Oriented to Self, Oriented to Place, Oriented to  Time, Oriented to Situation Alcohol / Substance Use: Not Applicable Psych Involvement: No (comment)  Admission diagnosis:  Symptomatic anemia [D64.9] Anemia, unspecified type [D64.9] Patient Active Problem List   Diagnosis Date Noted   Symptomatic anemia 06/24/2021   Hypertensive urgency 06/24/2021   Asthma 06/24/2021   CKD (chronic kidney disease), stage IIIa 06/24/2021   Chronic CHF (HCC) 06/24/2021   Pancytopenia (HCC) 06/24/2021   Community acquired pneumonia 11/20/2019   Pneumonia due to COVID-19 virus  11/20/2019   Anemia 06/12/2016   Hypoglycemia 02/11/2016   Hematemesis 04/04/2015   Cirrhosis (HCC) 04/04/2015   Esophageal varices (HCC) 04/04/2015   Hypothyroid 04/04/2015   Hypertension 04/04/2015   Chest pain 04/04/2015   PCP:  Preston Fleeting, MD Pharmacy:   [facility] [address] [place] - [address] [place]) [place] [address] Phone: [phone] Fax: [phone]     Social Determinants of Health (SDOH) Interventions    Readmission Risk Interventions No flowsheet data found.

## 2021-06-27 NOTE — Progress Notes (Signed)
Patient discharged to home via wheelchair, accompanied by volunteer. Discharge teaching completed and included: new medication regimen, new prescription info sheets, follow-up appointment info, diet information, and S&S to report/return with immediately. Patient verbalizes all discharge instructions and asks appropriate questions.

## 2021-06-27 NOTE — Plan of Care (Signed)

## 2021-06-27 NOTE — Discharge Summary (Signed)
Kathryn [name] FWY:[id number] DOB: 06-18-1956 DOA: 06/24/2021  PCP: Preston Fleeting, MD  Admit date: 06/24/2021 Discharge date: 06/27/2021  Admitted From: Home Disposition: Home  Recommendations for Outpatient Follow-up:  Follow up with PCP in 1 week Please obtain BMP/CBC in one week Please follow up with Dr. Norma Fredrickson gastroenterology in 2 weeks     Discharge Condition:Stable CODE STATUS: Full Diet recommendation: Heart Healthy Brief/Interim Summary: Per YDX:AJOIN B Kathryn [name] is a 65 y.o. female with medical history significant of alcohol abuse, alcoholic liver cirrhosis, portal vein hypertension, esophageal varices, hypertension, asthma, hypothyroidism, sarcoidosis, anemia, CHF (not 2D echo on record), CKD stage IIIa, who presents with shortness breath, generalized weakness.No dark stool or rectal bleeding recently.pt was found to have pancytopenia (WBC 3.0, hemoglobin 7.2, platelet 128, her hemoglobin 8.6 on 04/14/2020).  Patient was admitted to the hospital.  GI was consulted.  Symptomatic anemia Iron deficiency: Patient has history of liver cirrhosis with portal hypertension and esophageal varices, Status post 1 unit transfusion PRBC GI consulted Patient underwent EGD on 06/26/2021 and was found with grade 3 esophageal varices.  Completely eradicated.  Banded.  Portal hypertensive gastropathy and biopsy. No further bleed reported. Her H&H remained stable and she is stable to be discharged from GI standpoint via chat cleared by Dr. Norma Fredrickson.    Pancytopenia:  2/2 alcohol Monitor as outpatient     Cirrhosis (HCC): -Patient is on nadolol and spironolactone, but not taking these medications currently GI recommended nadolol, with f/u with them as outpatient and to hold aldactone for now. Was on octreotide for above while hospitalized         Hypothyroid Continue synthroid   Hypertension and hypertensive urgency: Blood pressure 211/104, this was due to medication  noncompliance Improving. Will need to f/u with pcp as outpatient     Chest pain: Most likely due to demand ischemia from anemia Troponin 11. Improved. Echo with nml EF   Asthma: -As needed albuterol   CKD (chronic kidney disease), stage IIIa: Stable.   Follow-up with primary care for monitoring     Chronic diastolic CHF (HCC): Without acute exacerbation Echo with normal EF   Alcohol abuse Patient was counseled on alcohol cessation discussed ongoing complications with the patient and she verbalizes her understanding   Discharge Diagnoses:  Principal Problem:   Symptomatic anemia Active Problems:   Cirrhosis (HCC)   Hypothyroid   Hypertension   Chest pain   Hypertensive urgency   Asthma   CKD (chronic kidney disease), stage IIIa   Chronic CHF (HCC)   Pancytopenia (HCC)    Discharge Instructions  Discharge Instructions     Call MD for:  difficulty breathing, headache or visual disturbances   Complete by: As directed    Diet - low sodium heart healthy   Complete by: As directed    Discharge instructions   Complete by: As directed    F/u with Dr. Norma Fredrickson GI doctor Avoid high potassium foods   Increase activity slowly   Complete by: As directed       Allergies as of 06/27/2021       Reactions   Hydrocodone-acetaminophen Other (See Comments)   Reaction: pt can't take anything with tylenol due to her liver disease.    Ibuprofen Other (See Comments)   Esophageal varices   Naproxen Other (See Comments)   Esophageal varices   Propoxyphene Other (See Comments)   Reaction: pt isn't sure, but knows she can't take it.    Shellfish Allergy Hives  Tamiflu  [oseltamivir Phosphate] Other (See Comments)   Esophageal varices        Medication List     STOP taking these medications    Baclofen 5 MG Tabs   lidocaine 5 % Commonly known as: Lidoderm   spironolactone 25 MG tablet Commonly known as: ALDACTONE       TAKE these medications    amLODipine  10 MG tablet Commonly known as: NORVASC Take 1 tablet (10 mg total) by mouth daily. Start taking on: June 28, 2021   ferrous sulfate 325 (65 FE) MG tablet Take 325 mg by mouth every other day.   folic acid 1 MG tablet Commonly known as: FOLVITE Take 1 mg by mouth daily.   levothyroxine 175 MCG tablet Commonly known as: SYNTHROID Take 1 tablet (175 mcg total) by mouth daily before breakfast.   multivitamin tablet Take 1 tablet by mouth every other day.   nadolol 20 MG tablet Commonly known as: CORGARD Take 1 tablet (20 mg total) by mouth daily. Start taking on: June 28, 2021   pantoprazole 40 MG tablet Commonly known as: PROTONIX Take 1 tablet (40 mg total) by mouth 2 (two) times daily.   thiamine 100 MG tablet Take 1 tablet (100 mg total) by mouth daily. Start taking on: June 28, 2021        Follow-up Information     Moody, Boykin Nearing, MD Follow up in 2 week(s).   Specialty: Gastroenterology Contact information: [address] [place] [address] [phone]         Revelo, Presley Raddle, MD Follow up in 1 week(s).   Specialty: Family Medicine Why: needs blood work Contact information: [address] [place] [address] [phone]                Allergies  Allergen Reactions   Hydrocodone-Acetaminophen Other (See Comments)    Reaction: pt can't take anything with tylenol due to her liver disease.    Ibuprofen Other (See Comments)    Esophageal varices   Naproxen Other (See Comments)    Esophageal varices   Propoxyphene Other (See Comments)    Reaction: pt isn't sure, but knows she can't take it.    Shellfish Allergy Hives   Tamiflu  [Oseltamivir Phosphate] Other (See Comments)    Esophageal varices    Consultations: GI   Procedures/Studies: DG Chest 2 View  Result Date: 06/24/2021 CLINICAL DATA:  Chest pain EXAM: CHEST - 2 VIEW COMPARISON:  04/14/2020 FINDINGS: Lungs are well expanded, symmetric, and clear.  No pneumothorax or pleural effusion. Cardiac size within normal limits. Pulmonary vascularity is normal. Osseous structures are age-appropriate. No acute bone abnormality. IMPRESSION: No active cardiopulmonary disease. Electronically Signed   By: Helyn Numbers MD   On: 06/24/2021 03:50   ECHOCARDIOGRAM COMPLETE  Result Date: 06/26/2021    ECHOCARDIOGRAM REPORT   Patient Name:   Kathryn [name] Date of Exam: 06/25/2021 Medical Rec #:  [id number]    Height:       64.0 in Accession #:    [id number]   Weight:       185.2 lb Date of Birth:  08/18/1956    BSA:          1.893 m Patient Age:    64 years     BP:           165/98 mmHg Patient Gender: F            HR:  53 bpm. Exam Location:  [facility] Procedure: 2D Echo, Cardiac Doppler and Color Doppler Indications:     I50.31 Acute Diastolic CHF  History:         Patient has no prior history of Echocardiogram examinations.                  Risk Factors:Hypertension. Alcoholic cirrhosis of liver.                  Congestive heart failure. Murmur. Hypothyroidism. Sarcoidosis.  Sonographer:     Sedonia Small Rodgers-Jones Referring Phys:  [id number] Lynn Ito Diagnosing Phys: Julien Nordmann MD IMPRESSIONS  1. Left ventricular ejection fraction, by estimation, is 60 to 65%. The left ventricle has normal function. The left ventricle has no regional wall motion abnormalities. There is moderate left ventricular hypertrophy. Left ventricular diastolic parameters are consistent with Grade I diastolic dysfunction (impaired relaxation).  2. Right ventricular systolic function is normal. The right ventricular size is normal.  3. Left atrial size was moderately dilated.  4. The mitral valve is normal in structure. Mild to moderate mitral valve regurgitation. FINDINGS  Left Ventricle: Left ventricular ejection fraction, by estimation, is 60 to 65%. The left ventricle has normal function. The left ventricle has no regional wall motion abnormalities. The left ventricular internal cavity size  was normal in size. There is  moderate left ventricular hypertrophy. Left ventricular diastolic parameters are consistent with Grade I diastolic dysfunction (impaired relaxation). Right Ventricle: The right ventricular size is normal. No increase in right ventricular wall thickness. Right ventricular systolic function is normal. Left Atrium: Left atrial size was moderately dilated. Right Atrium: Right atrial size was normal in size. Pericardium: There is no evidence of pericardial effusion. Mitral Valve: The mitral valve is normal in structure. Mild to moderate mitral valve regurgitation. No evidence of mitral valve stenosis. Tricuspid Valve: The tricuspid valve is normal in structure. Tricuspid valve regurgitation is mild . No evidence of tricuspid stenosis. Aortic Valve: The aortic valve is normal in structure. Aortic valve regurgitation is not visualized. No aortic stenosis is present. Pulmonic Valve: The pulmonic valve was normal in structure. Pulmonic valve regurgitation is not visualized. No evidence of pulmonic stenosis. Aorta: The aortic root is normal in size and structure. Venous: The inferior vena cava is normal in size with greater than 50% respiratory variability, suggesting right atrial pressure of 3 mmHg. IAS/Shunts: No atrial level shunt detected by color flow Doppler.  LEFT VENTRICLE PLAX 2D LVIDd:         3.90 cm  Diastology LVIDs:         2.00 cm  LV e' medial:    4.79 cm/s LV PW:         1.20 cm  LV E/e' medial:  15.5 LV IVS:        1.40 cm  LV e' lateral:   5.55 cm/s LVOT diam:     1.90 cm  LV E/e' lateral: 13.4 LV SV:         98 LV SV Index:   52 LVOT Area:     2.84 cm  RIGHT VENTRICLE RV Basal diam:  3.40 cm RV S prime:     16.00 cm/s TAPSE (M-mode): 3.0 cm LEFT ATRIUM             Index       RIGHT ATRIUM           Index LA diam:        4.90 cm 2.59  cm/m  RA Area:     14.10 cm LA Vol (A2C):   55.1 ml 29.10 ml/m RA Volume:   37.40 ml  19.75 ml/m LA Vol (A4C):   62.0 ml 32.74 ml/m LA  Biplane Vol: 60.8 ml 32.11 ml/m  AORTIC VALVE LVOT Vmax:   131.00 cm/s LVOT Vmean:  105.000 cm/s LVOT VTI:    0.347 m  AORTA Ao Root diam: 2.70 cm Ao Asc diam:  3.10 cm MITRAL VALVE               TRICUSPID VALVE MV Area (PHT): 2.48 cm    TR Peak grad:   13.5 mmHg MV Decel Time: 306 msec    TR Vmax:        184.00 cm/s MV E velocity: 74.10 cm/s MV A velocity: 77.10 cm/s  SHUNTS MV E/A ratio:  0.96        Systemic VTI:  0.35 m                            Systemic Diam: 1.90 cm Julien Nordmann MD Electronically signed by Julien Nordmann MD Signature Date/Time: 06/26/2021/2:59:10 PM    Final       Subjective:   Discharge Exam: Vitals:   06/27/21 0817 06/27/21 1122  BP: (!) 184/72 (!) 165/76  Pulse: (!) 48 (!) 46  Resp: 20   Temp: 97.8 F (36.6 C)   SpO2: 98% 100%   Vitals:   06/27/21 0239 06/27/21 0500 06/27/21 0817 06/27/21 1122  BP:   (!) 184/72 (!) 165/76  Pulse:   (!) 48 (!) 46  Resp:  15 20   Temp:   97.8 F (36.6 C)   TempSrc:      SpO2:   98% 100%  Weight: 80.7 kg     Height:        General: Pt is alert, awake, not in acute distress Cardiovascular: RRR, S1/S2 +, no rubs, no gallops Respiratory: CTA bilaterally, no wheezing, no rhonchi Abdominal: Soft, NT, ND, bowel sounds + Extremities: no edema    The results of significant diagnostics from this hospitalization (including imaging, microbiology, ancillary and laboratory) are listed below for reference.     Microbiology: Recent Results (from the past 240 hour(s))  Resp Panel by RT-PCR (Flu A&B, Covid) Nasopharyngeal Swab     Status: None   Collection Time: 06/24/21 11:02 AM   Specimen: Nasopharyngeal Swab; Nasopharyngeal(NP) swabs in vial transport medium  Result Value Ref Range Status   SARS Coronavirus 2 by RT PCR NEGATIVE NEGATIVE Final    Comment: (NOTE) SARS-CoV-2 target nucleic acids are NOT DETECTED.  The SARS-CoV-2 RNA is generally detectable in upper respiratory specimens during the acute phase of infection.  The lowest concentration of SARS-CoV-2 viral copies this assay can detect is 138 copies/mL. A negative result does not preclude SARS-Cov-2 infection and should not be used as the sole basis for treatment or other patient management decisions. A negative result may occur with  improper specimen collection/handling, submission of specimen other than nasopharyngeal swab, presence of viral mutation(s) within the areas targeted by this assay, and inadequate number of viral copies(<138 copies/mL). A negative result must be combined with clinical observations, patient history, and epidemiological information. The expected result is Negative.  Fact Sheet for Patients:  BloggerCourse.com  Fact Sheet for Healthcare Providers:  SeriousBroker.it  This test is no t yet approved or cleared by the [place] FDA and  has been  authorized for detection and/or diagnosis of SARS-CoV-2 by FDA under an Emergency Use Authorization (EUA). This EUA will remain  in effect (meaning this test can be used) for the duration of the COVID-19 declaration under Section 564(b)(1) of the Act, 21 U.S.C.section 360bbb-3(b)(1), unless the authorization is terminated  or revoked sooner.       Influenza A by PCR NEGATIVE NEGATIVE Final   Influenza B by PCR NEGATIVE NEGATIVE Final    Comment: (NOTE) The Xpert Xpress SARS-CoV-2/FLU/RSV plus assay is intended as an aid in the diagnosis of influenza from Nasopharyngeal swab specimens and should not be used as a sole basis for treatment. Nasal washings and aspirates are unacceptable for Xpert Xpress SARS-CoV-2/FLU/RSV testing.  Fact Sheet for Patients: BloggerCourse.comhttps://www.fda.gov/media/152166/download  Fact Sheet for Healthcare Providers: SeriousBroker.ithttps://www.fda.gov/media/152162/download  This test is not yet approved or cleared by the [place] FDA and has been authorized for detection and/or diagnosis of SARS-CoV-2 by FDA  under an Emergency Use Authorization (EUA). This EUA will remain in effect (meaning this test can be used) for the duration of the COVID-19 declaration under Section 564(b)(1) of the Act, 21 U.S.C. section 360bbb-3(b)(1), unless the authorization is terminated or revoked.  Performed at [facility], [address] [place], [place] [id number]      Labs: BNP (last 3 results) Recent Labs    06/24/21 0321  BNP 157.1*   Basic Metabolic Panel: Recent Labs  Lab 06/24/21 0351 06/25/21 0339  NA 134* 136  K 3.8 4.0  CL 106 105  CO2 21* 23  GLUCOSE 91 85  BUN 14 11  CREATININE 1.23* 1.17*  CALCIUM 8.3* 8.7*   Liver Function Tests: Recent Labs  Lab 06/24/21 0351  AST 99*  ALT 22  ALKPHOS 67  BILITOT 1.1  PROT 8.2*  ALBUMIN 3.7   No results for input(s): LIPASE, AMYLASE in the last 168 hours. No results for input(s): AMMONIA in the last 168 hours. CBC: Recent Labs  Lab 06/24/21 2138 06/25/21 0339 06/25/21 0950 06/25/21 1538 06/26/21 0641  WBC 5.1 4.6 4.9 6.3 4.3  HGB 8.0* 7.9* 8.2* 9.7* 9.2*  HCT 25.6* 25.9* 27.7* 32.9* 30.1*  MCV 71.5* 71.9* 74.3* 72.1* 73.4*  PLT 124* 126* 136* 173 144*   Cardiac Enzymes: No results for input(s): CKTOTAL, CKMB, CKMBINDEX, TROPONINI in the last 168 hours. BNP: Invalid input(s): POCBNP CBG: Recent Labs  Lab 06/25/21 0953 06/26/21 0842  GLUCAP 105* 111*   D-Dimer No results for input(s): DDIMER in the last 72 hours. Hgb A1c Recent Labs    06/24/21 1630  HGBA1C 5.3   Lipid Profile Recent Labs    06/25/21 0339  CHOL 232*  HDL 108  LDLCALC 111*  TRIG 67  CHOLHDL 2.1   Thyroid function studies No results for input(s): TSH, T4TOTAL, T3FREE, THYROIDAB in the last 72 hours.  Invalid input(s): FREET3 Anemia work up No results for input(s): VITAMINB12, FOLATE, FERRITIN, TIBC, IRON, RETICCTPCT in the last 72 hours. Urinalysis    Component Value Date/Time   COLORURINE STRAW (A) 06/24/2021 1705    APPEARANCEUR CLEAR (A) 06/24/2021 1705   APPEARANCEUR Clear 09/25/2014 1610   LABSPEC 1.004 (L) 06/24/2021 1705   LABSPEC 1.023 09/25/2014 1610   PHURINE 8.0 06/24/2021 1705   GLUCOSEU NEGATIVE 06/24/2021 1705   GLUCOSEU Negative 09/25/2014 1610   HGBUR NEGATIVE 06/24/2021 1705   BILIRUBINUR NEGATIVE 06/24/2021 1705   BILIRUBINUR Negative 09/25/2014 1610   KETONESUR NEGATIVE 06/24/2021 1705   PROTEINUR NEGATIVE 06/24/2021 1705  NITRITE NEGATIVE 06/24/2021 1705   LEUKOCYTESUR NEGATIVE 06/24/2021 1705   LEUKOCYTESUR Negative 09/25/2014 1610   Sepsis Labs Invalid input(s): PROCALCITONIN,  WBC,  LACTICIDVEN Microbiology Recent Results (from the past 240 hour(s))  Resp Panel by RT-PCR (Flu A&B, Covid) Nasopharyngeal Swab     Status: None   Collection Time: 06/24/21 11:02 AM   Specimen: Nasopharyngeal Swab; Nasopharyngeal(NP) swabs in vial transport medium  Result Value Ref Range Status   SARS Coronavirus 2 by RT PCR NEGATIVE NEGATIVE Final    Comment: (NOTE) SARS-CoV-2 target nucleic acids are NOT DETECTED.  The SARS-CoV-2 RNA is generally detectable in upper respiratory specimens during the acute phase of infection. The lowest concentration of SARS-CoV-2 viral copies this assay can detect is 138 copies/mL. A negative result does not preclude SARS-Cov-2 infection and should not be used as the sole basis for treatment or other patient management decisions. A negative result may occur with  improper specimen collection/handling, submission of specimen other than nasopharyngeal swab, presence of viral mutation(s) within the areas targeted by this assay, and inadequate number of viral copies(<138 copies/mL). A negative result must be combined with clinical observations, patient history, and epidemiological information. The expected result is Negative.  Fact Sheet for Patients:  BloggerCourse.com  Fact Sheet for Healthcare Providers:   SeriousBroker.it  This test is no t yet approved or cleared by the [place] FDA and  has been authorized for detection and/or diagnosis of SARS-CoV-2 by FDA under an Emergency Use Authorization (EUA). This EUA will remain  in effect (meaning this test can be used) for the duration of the COVID-19 declaration under Section 564(b)(1) of the Act, 21 U.S.C.section 360bbb-3(b)(1), unless the authorization is terminated  or revoked sooner.       Influenza A by PCR NEGATIVE NEGATIVE Final   Influenza B by PCR NEGATIVE NEGATIVE Final    Comment: (NOTE) The Xpert Xpress SARS-CoV-2/FLU/RSV plus assay is intended as an aid in the diagnosis of influenza from Nasopharyngeal swab specimens and should not be used as a sole basis for treatment. Nasal washings and aspirates are unacceptable for Xpert Xpress SARS-CoV-2/FLU/RSV testing.  Fact Sheet for Patients: BloggerCourse.com  Fact Sheet for Healthcare Providers: SeriousBroker.it  This test is not yet approved or cleared by the [place] FDA and has been authorized for detection and/or diagnosis of SARS-CoV-2 by FDA under an Emergency Use Authorization (EUA). This EUA will remain in effect (meaning this test can be used) for the duration of the COVID-19 declaration under Section 564(b)(1) of the Act, 21 U.S.C. section 360bbb-3(b)(1), unless the authorization is terminated or revoked.  Performed at [facility], [address] [place], [place] [address]      Time coordinating discharge: Over 30 minutes  SIGNED:   Lynn Ito, MD  [facility] 06/27/2021, 12:27 PM Pager   If 7PM-7AM, please contact night-coverage [email or web address] Password [email or web address]

## 2021-06-30 LAB — SURGICAL PATHOLOGY

## 2021-10-06 ENCOUNTER — Other Ambulatory Visit: Payer: Self-pay | Admitting: Student

## 2021-10-06 DIAGNOSIS — Z1231 Encounter for screening mammogram for malignant neoplasm of breast: Secondary | ICD-10-CM

## 2022-01-02 ENCOUNTER — Emergency Department: Payer: Medicare Other

## 2022-01-02 ENCOUNTER — Inpatient Hospital Stay: Payer: Medicare Other

## 2022-01-02 ENCOUNTER — Other Ambulatory Visit: Payer: Self-pay

## 2022-01-02 ENCOUNTER — Inpatient Hospital Stay
Admission: EM | Admit: 2022-01-02 | Discharge: 2022-01-07 | DRG: 438 | Disposition: A | Discharge status: Home Health | Payer: Medicare Other | Attending: Internal Medicine | Admitting: Internal Medicine

## 2022-01-02 DIAGNOSIS — I8510 Secondary esophageal varices without bleeding: Secondary | ICD-10-CM | POA: Diagnosis present

## 2022-01-02 DIAGNOSIS — Z825 Family history of asthma and other chronic lower respiratory diseases: Secondary | ICD-10-CM

## 2022-01-02 DIAGNOSIS — J45909 Unspecified asthma, uncomplicated: Secondary | ICD-10-CM | POA: Diagnosis present

## 2022-01-02 DIAGNOSIS — K766 Portal hypertension: Secondary | ICD-10-CM | POA: Diagnosis present

## 2022-01-02 DIAGNOSIS — Z20822 Contact with and (suspected) exposure to covid-19: Secondary | ICD-10-CM | POA: Diagnosis present

## 2022-01-02 DIAGNOSIS — D869 Sarcoidosis, unspecified: Secondary | ICD-10-CM | POA: Diagnosis present

## 2022-01-02 DIAGNOSIS — I81 Portal vein thrombosis: Secondary | ICD-10-CM | POA: Diagnosis present

## 2022-01-02 DIAGNOSIS — Z6831 Body mass index (BMI) 31.0-31.9, adult: Secondary | ICD-10-CM

## 2022-01-02 DIAGNOSIS — E039 Hypothyroidism, unspecified: Secondary | ICD-10-CM | POA: Diagnosis present

## 2022-01-02 DIAGNOSIS — F10239 Alcohol dependence with withdrawal, unspecified: Secondary | ICD-10-CM | POA: Diagnosis present

## 2022-01-02 DIAGNOSIS — K852 Alcohol induced acute pancreatitis without necrosis or infection: Secondary | ICD-10-CM | POA: Diagnosis not present

## 2022-01-02 DIAGNOSIS — R079 Chest pain, unspecified: Secondary | ICD-10-CM | POA: Diagnosis not present

## 2022-01-02 DIAGNOSIS — K760 Fatty (change of) liver, not elsewhere classified: Secondary | ICD-10-CM | POA: Diagnosis present

## 2022-01-02 DIAGNOSIS — K859 Acute pancreatitis without necrosis or infection, unspecified: Secondary | ICD-10-CM | POA: Diagnosis present

## 2022-01-02 DIAGNOSIS — I864 Gastric varices: Secondary | ICD-10-CM | POA: Diagnosis present

## 2022-01-02 DIAGNOSIS — Z888 Allergy status to other drugs, medicaments and biological substances status: Secondary | ICD-10-CM | POA: Diagnosis not present

## 2022-01-02 DIAGNOSIS — R072 Precordial pain: Secondary | ICD-10-CM | POA: Diagnosis present

## 2022-01-02 DIAGNOSIS — Z9851 Tubal ligation status: Secondary | ICD-10-CM

## 2022-01-02 DIAGNOSIS — R5381 Other malaise: Secondary | ICD-10-CM | POA: Diagnosis not present

## 2022-01-02 DIAGNOSIS — K7031 Alcoholic cirrhosis of liver with ascites: Secondary | ICD-10-CM | POA: Diagnosis not present

## 2022-01-02 DIAGNOSIS — I700 Atherosclerosis of aorta: Secondary | ICD-10-CM | POA: Diagnosis not present

## 2022-01-02 DIAGNOSIS — I10 Essential (primary) hypertension: Secondary | ICD-10-CM | POA: Diagnosis present

## 2022-01-02 DIAGNOSIS — Z8249 Family history of ischemic heart disease and other diseases of the circulatory system: Secondary | ICD-10-CM

## 2022-01-02 DIAGNOSIS — I5032 Chronic diastolic (congestive) heart failure: Secondary | ICD-10-CM | POA: Diagnosis present

## 2022-01-02 DIAGNOSIS — K8020 Calculus of gallbladder without cholecystitis without obstruction: Secondary | ICD-10-CM | POA: Diagnosis present

## 2022-01-02 DIAGNOSIS — E669 Obesity, unspecified: Secondary | ICD-10-CM | POA: Diagnosis present

## 2022-01-02 DIAGNOSIS — D696 Thrombocytopenia, unspecified: Secondary | ICD-10-CM | POA: Diagnosis present

## 2022-01-02 DIAGNOSIS — K7460 Unspecified cirrhosis of liver: Secondary | ICD-10-CM | POA: Diagnosis present

## 2022-01-02 DIAGNOSIS — Z79899 Other long term (current) drug therapy: Secondary | ICD-10-CM

## 2022-01-02 DIAGNOSIS — Z91013 Allergy to seafood: Secondary | ICD-10-CM

## 2022-01-02 DIAGNOSIS — E876 Hypokalemia: Secondary | ICD-10-CM

## 2022-01-02 DIAGNOSIS — K219 Gastro-esophageal reflux disease without esophagitis: Secondary | ICD-10-CM | POA: Diagnosis present

## 2022-01-02 DIAGNOSIS — I110 Hypertensive heart disease with heart failure: Secondary | ICD-10-CM | POA: Diagnosis present

## 2022-01-02 DIAGNOSIS — K7030 Alcoholic cirrhosis of liver without ascites: Secondary | ICD-10-CM | POA: Diagnosis present

## 2022-01-02 DIAGNOSIS — K8521 Alcohol induced acute pancreatitis with uninfected necrosis: Secondary | ICD-10-CM | POA: Diagnosis not present

## 2022-01-02 DIAGNOSIS — Z79890 Hormone replacement therapy (postmenopausal): Secondary | ICD-10-CM

## 2022-01-02 DIAGNOSIS — D649 Anemia, unspecified: Secondary | ICD-10-CM | POA: Diagnosis present

## 2022-01-02 LAB — BASIC METABOLIC PANEL
Anion gap: 14 (ref 5–15)
BUN: 10 mg/dL (ref 8–23)
CO2: 22 mmol/L (ref 22–32)
Calcium: 8 mg/dL — ABNORMAL LOW (ref 8.9–10.3)
Chloride: 106 mmol/L (ref 98–111)
Creatinine, Ser: 0.56 mg/dL (ref 0.44–1.00)
GFR, Estimated: >60 mL/min (ref >60)
Glucose, Bld: 65 mg/dL — ABNORMAL LOW (ref 70–99)
Potassium: 3.9 mmol/L (ref 3.5–5.1)
Sodium: 142 mmol/L (ref 135–145)

## 2022-01-02 LAB — CBC
HCT: 33.8 % — ABNORMAL LOW (ref 36.0–46.0)
Hemoglobin: 10.8 g/dL — ABNORMAL LOW (ref 12.0–15.0)
MCH: 30.9 pg (ref 26.0–34.0)
MCHC: 32 g/dL (ref 30.0–36.0)
MCV: 96.6 fL (ref 80.0–100.0)
Platelets: 171 10*3/uL (ref 150–400)
RBC: 3.5 MIL/uL — ABNORMAL LOW (ref 3.87–5.11)
RDW: 18.6 % — ABNORMAL HIGH (ref 11.5–15.5)
WBC: 7.4 10*3/uL (ref 4.0–10.5)
nRBC: 0.4 % — ABNORMAL HIGH (ref 0.0–0.2)

## 2022-01-02 LAB — TROPONIN I (HIGH SENSITIVITY)
Troponin I (High Sensitivity): 16 ng/L (ref <18)
Troponin I (High Sensitivity): 12 ng/L (ref <18)

## 2022-01-02 LAB — HEPATIC FUNCTION PANEL
ALT: 24 U/L (ref 0–44)
AST: 98 U/L — ABNORMAL HIGH (ref 15–41)
Albumin: 2.6 g/dL — ABNORMAL LOW (ref 3.5–5.0)
Alkaline Phosphatase: 233 U/L — ABNORMAL HIGH (ref 38–126)
Bilirubin, Direct: 0.5 mg/dL — ABNORMAL HIGH (ref 0.0–0.2)
Indirect Bilirubin: 0.8 mg/dL (ref 0.3–0.9)
Total Bilirubin: 1.3 mg/dL — ABNORMAL HIGH (ref 0.3–1.2)
Total Protein: 7.8 g/dL (ref 6.5–8.1)

## 2022-01-02 LAB — RESP PANEL BY RT-PCR (FLU A&B, COVID) ARPGX2
Influenza A by PCR: NEGATIVE
Influenza B by PCR: NEGATIVE
SARS Coronavirus 2 by RT PCR: NEGATIVE

## 2022-01-02 LAB — LIPASE, BLOOD: Lipase: 368 U/L — ABNORMAL HIGH (ref 11–51)

## 2022-01-02 LAB — CBG MONITORING, ED: Glucose-Capillary: 72 mg/dL (ref 70–99)

## 2022-01-02 MED ORDER — FOLIC ACID 1 MG PO TABS
1.0000 mg | ORAL_TABLET | Freq: Every day | ORAL | Status: DC
  Administered 2022-01-03 – 2022-01-07 (×5): 1 mg via ORAL
  Filled 2022-01-02 (×5): qty 1

## 2022-01-02 MED ORDER — ONDANSETRON HCL 4 MG/2ML IJ SOLN
4.0000 mg | Freq: Once | INTRAMUSCULAR | Status: AC
  Administered 2022-01-02: 4 mg via INTRAVENOUS
  Filled 2022-01-02: qty 2

## 2022-01-02 MED ORDER — THIAMINE HCL 100 MG PO TABS
100.0000 mg | ORAL_TABLET | Freq: Every day | ORAL | Status: DC
  Administered 2022-01-03 – 2022-01-07 (×5): 100 mg via ORAL
  Filled 2022-01-02 (×5): qty 1

## 2022-01-02 MED ORDER — FERROUS SULFATE 325 (65 FE) MG PO TABS
325.0000 mg | ORAL_TABLET | ORAL | Status: DC
  Administered 2022-01-03 – 2022-01-07 (×3): 325 mg via ORAL
  Filled 2022-01-02 (×3): qty 1

## 2022-01-02 MED ORDER — TRAZODONE HCL 50 MG PO TABS
25.0000 mg | ORAL_TABLET | Freq: Every evening | ORAL | Status: DC | PRN
  Administered 2022-01-04 – 2022-01-06 (×3): 25 mg via ORAL
  Filled 2022-01-02 (×3): qty 1

## 2022-01-02 MED ORDER — ENOXAPARIN SODIUM 40 MG/0.4ML IJ SOSY
40.0000 mg | PREFILLED_SYRINGE | INTRAMUSCULAR | Status: DC
  Administered 2022-01-03: 40 mg via SUBCUTANEOUS
  Filled 2022-01-02: qty 0.4

## 2022-01-02 MED ORDER — ACETAMINOPHEN 325 MG PO TABS
650.0000 mg | ORAL_TABLET | Freq: Four times a day (QID) | ORAL | Status: DC | PRN
  Administered 2022-01-04 – 2022-01-05 (×4): 650 mg via ORAL
  Filled 2022-01-02 (×4): qty 2

## 2022-01-02 MED ORDER — ONDANSETRON HCL 4 MG/2ML IJ SOLN
4.0000 mg | Freq: Four times a day (QID) | INTRAMUSCULAR | Status: DC | PRN
  Administered 2022-01-03 (×2): 4 mg via INTRAVENOUS
  Filled 2022-01-02 (×2): qty 2

## 2022-01-02 MED ORDER — SPIRONOLACTONE 25 MG PO TABS
100.0000 mg | ORAL_TABLET | Freq: Every day | ORAL | Status: DC
Start: 2022-01-03 — End: 2022-01-07
  Administered 2022-01-03 – 2022-01-07 (×5): 100 mg via ORAL
  Filled 2022-01-02 (×5): qty 4

## 2022-01-02 MED ORDER — MAGNESIUM HYDROXIDE 400 MG/5ML PO SUSP: 30.0000 mL | Freq: Every day | ORAL | Status: DC | PRN

## 2022-01-02 MED ORDER — ACETAMINOPHEN 650 MG RE SUPP: 650.0000 mg | Freq: Four times a day (QID) | RECTAL | Status: DC | PRN

## 2022-01-02 MED ORDER — NADOLOL 40 MG PO TABS
40.0000 mg | ORAL_TABLET | Freq: Every day | ORAL | Status: DC
  Administered 2022-01-03 – 2022-01-07 (×5): 40 mg via ORAL
  Filled 2022-01-02 (×5): qty 1

## 2022-01-02 MED ORDER — ADULT MULTIVITAMIN W/MINERALS CH
1.0000 | ORAL_TABLET | ORAL | Status: DC
  Administered 2022-01-03 – 2022-01-07 (×3): 1 via ORAL
  Filled 2022-01-02 (×3): qty 1

## 2022-01-02 MED ORDER — MORPHINE SULFATE (PF) 4 MG/ML IV SOLN
4.0000 mg | Freq: Once | INTRAVENOUS | Status: AC
  Administered 2022-01-02: 4 mg via INTRAVENOUS
  Filled 2022-01-02: qty 1

## 2022-01-02 MED ORDER — ONDANSETRON HCL 4 MG PO TABS: 4.0000 mg | ORAL_TABLET | Freq: Four times a day (QID) | ORAL | Status: DC | PRN

## 2022-01-02 MED ORDER — SODIUM CHLORIDE 0.9 % IV BOLUS
500.0000 mL | Freq: Once | INTRAVENOUS | Status: AC
  Administered 2022-01-02: 500 mL via INTRAVENOUS

## 2022-01-02 MED ORDER — MORPHINE SULFATE (PF) 2 MG/ML IV SOLN
2.0000 mg | INTRAVENOUS | Status: DC | PRN
  Administered 2022-01-03 (×3): 2 mg via INTRAVENOUS
  Filled 2022-01-02 (×4): qty 1

## 2022-01-02 MED ORDER — AMLODIPINE BESYLATE 10 MG PO TABS
10.0000 mg | ORAL_TABLET | Freq: Every day | ORAL | Status: DC
  Administered 2022-01-03 – 2022-01-07 (×5): 10 mg via ORAL
  Filled 2022-01-02 (×2): qty 1
  Filled 2022-01-02: qty 2
  Filled 2022-01-02 (×2): qty 1

## 2022-01-02 MED ORDER — LEVOTHYROXINE SODIUM 50 MCG PO TABS
175.0000 ug | ORAL_TABLET | Freq: Every day | ORAL | Status: DC
  Administered 2022-01-03 – 2022-01-07 (×5): 175 ug via ORAL
  Filled 2022-01-02: qty 1
  Filled 2022-01-02: qty 4
  Filled 2022-01-02 (×2): qty 1
  Filled 2022-01-02: qty 4
  Filled 2022-01-02 (×2): qty 1

## 2022-01-02 MED ORDER — DEXTROSE-NACL 5-0.9 % IV SOLN: INTRAVENOUS | Status: DC

## 2022-01-02 NOTE — H&P (Addendum)
[name]   PATIENT NAME: Kathryn [name]    MR#:  [id number]  DATE OF BIRTH:  1956/10/26  DATE OF ADMISSION:  01/02/2022  PRIMARY CARE PHYSICIAN: Theotis Burrow, MD   Patient is coming from: Home  REQUESTING/REFERRING PHYSICIAN: Ashok Cordia, PA-C  CHIEF COMPLAINT:   Chief Complaint  Patient presents with   Chest Pain    HISTORY OF PRESENT ILLNESS:  Kathryn [name] is a 66 y.o. female with medical history significant for asthma, CHF, hypertension hypothyroidism, alcoholic liver cirrhosis, portal hypertension and esophageal varices as well as sarcoidosis, who presented to the emergency room with onset of midsternal chest pain felt as pressure as if somebody sitting on her chest with associated diaphoresis and radiation to her left arm with left arm numbness as well as her neck.  She had nausea and vomiting 2 days ago but that resolved.  She admits to mild left upper quadrant and epigastric tenderne pain.  No diarrhea or melena or bright red blood per rectum.   No leg pain or edema or recent travels or surgeries.  She has been having mild cough with inability to expectorate and without wheezing.  She admits to mild dyspnea at night.  Her last alcoholic drink was 2 days ago.  She stated that she cut down to 1-2 shots per day.  No dysuria, oliguria or hematuria or flank pain.  ED Course: Upon presentation to the emergency room BP was 163/79 with otherwise normal vital signs. Labs revealed a glucose of 65 and alk phos of 233 with albumin 2.6 and AST 98 with total bili of 1.3 and direct bili of 0.5.  High sensitive troponin I was 16 and later 12.  Hemoglobin is in 10.8 and hematocrit 33.8 with platelets of 171 better than the previous levels.  Influenza antigens and COVID-19 PCR came back negative.    EKG as reviewed by me : EKG showed normal sinus rhythm rate of 73 and you have inversion inferiorly Imaging: Noncontrast head CT scan showed partial left mastoid effusion with no acute  intracranial pathology.  Right upper quadrant ultrasound showed query nonocclusive portal vein thrombosis, hepatic steatosis, cholelithiasis with no acute cholecystitis.  The patient was given 4 mg of IV morphine sulfate, 4 mg IV Zofran and 500 mL IV normal saline bolus with she will be admitted to a cardiac telemetry bed for further evaluation and management. PAST MEDICAL HISTORY:   Past Medical History:  Diagnosis Date   Alcoholic cirrhosis of liver (HCC)    Asthma    CHF (congestive heart failure) ([place])    Chronic disease anemia    Esophageal varices (HCC)    GR I on EGD by Dr Rayann Heman 09/2014   ETOH abuse    Hypertension    Hypothyroidism    Murmur    Portal hypertension ([place])    Sarcoidosis     PAST SURGICAL HISTORY:   Past Surgical History:  Procedure Laterality Date   ESOPHAGOGASTRODUODENOSCOPY N/A 06/26/2021   Procedure: ESOPHAGOGASTRODUODENOSCOPY (EGD);  Surgeon: Toledo, Benay Pike, MD;  Location: [facility] ENDOSCOPY;  Service: Gastroenterology;  Laterality: N/A;   ESOPHAGOGASTRODUODENOSCOPY (EGD) WITH PROPOFOL N/A 04/04/2015   Procedure: ESOPHAGOGASTRODUODENOSCOPY (EGD) WITH PROPOFOL;  Surgeon: Lucilla Lame, MD;  Location: [facility] ENDOSCOPY;  Service: Endoscopy;  Laterality: N/A;  Dr Allen Norris prefers 1445   ESOPHAGOGASTRODUODENOSCOPY W/ BANDING  09/2014   [place]   TUBAL LIGATION      SOCIAL HISTORY:   Social History   Tobacco  Use   Smoking status: Never   Smokeless tobacco: Never  Substance Use Topics   Alcohol use: Yes    Alcohol/week: 15.0 standard drinks    Types: 15 Shots of liquor per week    FAMILY HISTORY:   Family History  Problem Relation Age of Onset   Aneurysm Father    COPD Father    Heart attack Mother     DRUG ALLERGIES:   Allergies  Allergen Reactions   Hydrocodone-Acetaminophen Other (See Comments)    Reaction: pt can't take anything with tylenol due to her liver disease.    Ibuprofen Other (See Comments)    Esophageal varices   Naproxen Other (See  Comments)    Esophageal varices   Propoxyphene Other (See Comments)    Reaction: pt isn't sure, but knows she can't take it.    Shellfish Allergy Hives   Tamiflu  [Oseltamivir Phosphate] Other (See Comments)    Esophageal varices    REVIEW OF SYSTEMS:   ROS As per history of present illness. All pertinent systems were reviewed above. Constitutional, HEENT, cardiovascular, respiratory, GI, GU, musculoskeletal, neuro, psychiatric, endocrine, integumentary and hematologic systems were reviewed and are otherwise negative/unremarkable except for positive findings mentioned above in the HPI.   MEDICATIONS AT HOME:   Prior to Admission medications   Medication Sig Start Date End Date Taking? Authorizing Provider  amLODipine (NORVASC) 10 MG tablet Take 1 tablet (10 mg total) by mouth daily. 06/28/21 07/28/21  Nolberto Hanlon, MD  ferrous sulfate 325 (65 FE) MG tablet Take 325 mg by mouth every other day.    [provider]  folic acid (FOLVITE) 1 MG tablet Take 1 mg by mouth daily.    [provider]  levothyroxine (SYNTHROID) 175 MCG tablet Take 1 tablet (175 mcg total) by mouth daily before breakfast. 06/27/21 07/27/21  Nolberto Hanlon, MD  Multiple Vitamin (MULTIVITAMIN) tablet Take 1 tablet by mouth every other day.    [provider]  nadolol (CORGARD) 20 MG tablet Take 1 tablet (20 mg total) by mouth daily. 06/28/21 07/28/21  Nolberto Hanlon, MD  pantoprazole (PROTONIX) 40 MG tablet Take 1 tablet (40 mg total) by mouth 2 (two) times daily. 06/27/21 07/27/21  Nolberto Hanlon, MD      VITAL SIGNS:  Blood pressure (!) 155/83, pulse 83, temperature 97.8 F (36.6 C), temperature source Oral, resp. rate (!) 22, height 5' 4" (1.626 m), weight 81.6 kg, SpO2 98 %.  PHYSICAL EXAMINATION:  Physical Exam  GENERAL:  66 y.o.-year-old female patient lying in the bed with no acute distress.  EYES: Pupils equal, round, reactive to light and accommodation. No scleral icterus. Extraocular muscles  intact.  HEENT: Head atraumatic, normocephalic. Oropharynx and nasopharynx clear.  NECK:  Supple, no jugular venous distention. No thyroid enlargement, no tenderness.  LUNGS: Normal breath sounds bilaterally, no wheezing, rales,rhonchi or crepitation. No use of accessory muscles of respiration.  CARDIOVASCULAR: Regular rate and rhythm, S1, S2 normal. No murmurs, rubs, or gallops.  ABDOMEN: Soft, nondistended, with epigastric and left upper quadrant as well as right upper quadrant tenderness without rebound tenderness guarding or rigidity.  She had a negative Murphy sign.  Bowel sounds present. No organomegaly or mass.  EXTREMITIES: No pedal edema, cyanosis, or clubbing.  NEUROLOGIC: Cranial nerves II through XII are intact. Muscle strength 5/5 in all extremities. Sensation intact. Gait not checked.  PSYCHIATRIC: The patient is alert and oriented x 3.  Normal affect and good eye contact. SKIN: No obvious rash,  lesion, or ulcer.   LABORATORY PANEL:   CBC Recent Labs  Lab 01/02/22 1622  WBC 7.4  HGB 10.8*  HCT 33.8*  PLT 171   ------------------------------------------------------------------------------------------------------------------  Chemistries  Recent Labs  Lab 01/02/22 1622 01/02/22 1630  NA 142  --   K 3.9  --   CL 106  --   CO2 22  --   GLUCOSE 65*  --   BUN 10  --   CREATININE 0.56  --   CALCIUM 8.0*  --   AST  --  98*  ALT  --  24  ALKPHOS  --  233*  BILITOT  --  1.3*   ------------------------------------------------------------------------------------------------------------------  Cardiac Enzymes No results for input(s): TROPONINI in the last 168 hours. ------------------------------------------------------------------------------------------------------------------  RADIOLOGY:  DG Chest 2 View  Result Date: 01/02/2022 CLINICAL DATA:  Chest pain. EXAM: CHEST - 2 VIEW COMPARISON:  June 24, 2021. FINDINGS: The heart size and mediastinal contours are  within normal limits. Both lungs are clear. The visualized skeletal structures are unremarkable. IMPRESSION: No active cardiopulmonary disease. Electronically Signed   By: Marijo Conception M.D.   On: 01/02/2022 16:49   CT HEAD WO CONTRAST (5MM)  Result Date: 01/02/2022 CLINICAL DATA:  Neurologic deficit. EXAM: CT HEAD WITHOUT CONTRAST TECHNIQUE: Contiguous axial images were obtained from the base of the skull through the vertex without intravenous contrast. RADIATION DOSE REDUCTION: This exam was performed according to the departmental dose-optimization program which includes automated exposure control, adjustment of the mA and/or kV according to patient size and/or use of iterative reconstruction technique. COMPARISON:  Head CT dated 10/26/2019. FINDINGS: Brain: Mild age-related atrophy and chronic microvascular ischemic changes. There is no acute intracranial hemorrhage. No mass effect or midline shift. No extra-axial fluid collection. Vascular: No hyperdense vessel or unexpected calcification. Skull: Normal. Negative for fracture or focal lesion. Sinuses/Orbits: There is opacification of several left mastoid air cells. The right mastoid air cells and the visualized paranasal sinuses are clear. Other: None IMPRESSION: 1. No acute intracranial pathology. 2. Partial left mastoid effusion. Electronically Signed   By: Anner Crete M.D.   On: 01/02/2022 19:12      IMPRESSION AND PLAN:  Principal Problem:   Acute pancreatitis  1.  Acute alcoholic pancreatitis with associated alcohol abuse.. - The patient was admitted to cardiac telemetry bed. - We will follow serial lipase levels. - She will be kept n.p.o. and hydrated with IV D5 normal saline given borderline glucose level. -.  I counseled her for cessation of alcohol. - She will be placed on banana bag daily. - As needed IV Ativan will be provided for alcohol withdrawal.  2.  Chest pain, rule out ACS.  It certainly could be related to #1. - We  will follow serial troponins. - Cardiology consult will be obtained. - I notified Dr. Aundra Dubin about the patient.  3.  Cholelithiasis without cholecystitis. - The patient has no common bile duct dilatation or evidence of obstruction at this time. - She may benefit from general surgery consult after improvement of her pancreatitis.  4.  Hypothyroidism. - We will continue Synthroid.  5.  Essential hypertension. - We will continue Norvasc, Corgard and Aldactone.  6.  GERD. - Continue PPI therapy.  DVT prophylaxis: Lovenox while adding coagulation profile.. Advanced Care Planning:  Code Status: full code. Family Communication:  The plan of care was discussed in details with the patient (and family). I answered all questions. The patient agreed to proceed with the  above mentioned plan. Further management will depend upon hospital course. Disposition Plan: Back to previous home environment Consults called: Cardiology. All the records are reviewed and case discussed with ED provider.  Status is: Inpatient  At the time of the admission, it appears that the appropriate admission status for this patient is inpatient.  This is judged to be reasonable and necessary in order to provide the required intensity of service to ensure the patient's safety given the presenting symptoms, physical exam findings and initial radiographic and laboratory data in the context of comorbid conditions.  The patient requires inpatient status due to high intensity of service, high risk of further deterioration and high frequency of surveillance required.  I certify that at the time of admission, it is my clinical judgment that the patient will require inpatient hospital care extending more than 2 midnights.                            Dispo: The patient is from: Home              Anticipated d/c is to: Home              Patient currently is not medically stable to d/c.              Difficult to place patient:  No  Christel Mormon M.D on 01/02/2022 at 7:38 PM  [facility]   From 7 PM-7 AM, contact night-coverage [email or web address]  CC: Primary care physician; Revelo, Elyse Jarvis, MD

## 2022-01-02 NOTE — ED Notes (Signed)
Pts CBG 65, pt given 4 oz of orange juice and crackers

## 2022-01-02 NOTE — ED Triage Notes (Signed)
Pt to ED via POV from home. Pt states sudden onset of CP that radiates to left arm. Pt also reports SOB. Pt with hx CHF and HTN. Pt states loss of appetite and N/V.

## 2022-01-02 NOTE — ED Provider Notes (Signed)
[facility] Provider Note    Event Date/Time   First MD Initiated Contact with Patient 01/02/22 1814     (approximate)   History   Chest Pain   HPI  Kathryn [name] is a 66 y.o. female with history of hypertension, EtOH abuse, CHF, and cirrhosis of the liver presents emergency department with midsternal chest pain.  States made her left arm go numb.  She also got a little sweaty at the time.  This happened at approximately 9 AM.  States the chest pain has gotten better but her left arm is still numb.  Denies headache or altered mental status.  No nausea.  Did feel like she had indigestion earlier when she had chest pain.      Physical Exam   Triage Vital Signs: ED Triage Vitals [01/02/22 1618]  Enc Vitals Group     BP (!) 163/79     Pulse Rate 91     Resp 18     Temp 97.8 F (36.6 C)     Temp Source Oral     SpO2 100 %     Weight 180 lb (81.6 kg)     Height 5\' 4"  (1.626 m)     Head Circumference      Peak Flow      Pain Score 8     Pain Loc      Pain Edu?      Excl. in GC?     Most recent vital signs: Vitals:   01/02/22 1618 01/02/22 1805  BP: (!) 163/79 (!) 155/83  Pulse: 91 83  Resp: 18 (!) 22  Temp: 97.8 F (36.6 C)   SpO2: 100% 98%     General: Awake, no distress.   CV:  Good peripheral perfusion. regular rate and  rhythm Resp:  Normal effort. Lungs CTA Abd:  No distention.  Tender in the epigastric area Other:  Full strength in lower extremities, cranial nerves II through XII grossly intact, grips are equal bilaterally   ED Results / Procedures / Treatments   Labs (all labs ordered are listed, but only abnormal results are displayed) Labs Reviewed  BASIC METABOLIC PANEL - Abnormal; Notable for the following components:      Result Value   Glucose, Bld 65 (*)    Calcium 8.0 (*)    All other components within normal limits  CBC - Abnormal; Notable for the following components:   RBC 3.50 (*)    Hemoglobin 10.8 (*)     HCT 33.8 (*)    RDW 18.6 (*)    nRBC 0.4 (*)    All other components within normal limits  HEPATIC FUNCTION PANEL - Abnormal; Notable for the following components:   Albumin 2.6 (*)    AST 98 (*)    Alkaline Phosphatase 233 (*)    Total Bilirubin 1.3 (*)    Bilirubin, Direct 0.5 (*)    All other components within normal limits  LIPASE, BLOOD - Abnormal; Notable for the following components:   Lipase 368 (*)    All other components within normal limits  RESP PANEL BY RT-PCR (FLU A&B, COVID) ARPGX2  CBG MONITORING, ED  TROPONIN I (HIGH SENSITIVITY)  TROPONIN I (HIGH SENSITIVITY)     EKG  EKG chest sinus rhythm   RADIOLOGY Chest x-ray    PROCEDURES:   Procedures   MEDICATIONS ORDERED IN ED: Medications  morphine (PF) 4 MG/ML injection 4 mg (has no administration in time range)  ondansetron (ZOFRAN) injection 4 mg (has no administration in time range)  sodium chloride 0.9 % bolus 500 mL (has no administration in time range)     IMPRESSION / MDM / ASSESSMENT AND PLAN / ED COURSE  I reviewed the triage vital signs and the nursing notes.                              Differential diagnosis includes, but is not limited to NSTEMI, MI, GERD, pancreatitis, CVA  Labs are reassuring at this time, CBC and metabolic panel are normal, troponin is normal, will await second troponin, hepatic function panel and lipase  Chest x-ray was reviewed by me and not seen acute abnormalities, was read by radiology as normal  EKG shows normal sinus rhythm, see physician right  Due to patient's continued numbness on the left side we will order CT of the head without contrast  CT of the head is negative, it was reviewed by me and confirmed by radiology  Lipase is elevated at 365 indicating alcoholic pancreatitis, alkaline phosphatase is also elevated at 233  I did explain these findings to the patient.  She is agreeable to admission for pancreatitis.  She is still drinking alcohol even  though she has cirrhosis.  Explained to her that pancreatitis is most likely coming from her alcohol intake.  Consult hospitalist for admission, COVID test ordered  Spoke with Dr. Arville Care he will be admitting the patient.  He will also consult with GI.  Ultrasound right upper quadrant ordered.    Patient is being admitted in stable condition at this time.  Did order fluids, morphine 4 mg IV for pain and Zofran for nausea.        FINAL CLINICAL IMPRESSION(S) / ED DIAGNOSES   Final diagnoses:  Alcohol-induced acute pancreatitis, unspecified complication status  Nonspecific chest pain     Rx / DC Orders   ED Discharge Orders          Ordered    US Abdomen Limited RUQ (LIVER/GB)        01/02/22 1934             Note:  This document was prepared using Dragon voice recognition software and may include unintentional dictation errors.    Faythe Ghee, PA-C 01/02/22 Danne Baxter, MD 01/02/22 2101

## 2022-01-02 NOTE — ED Notes (Signed)
First Nurse Note:  Pt to ED via POV c/o chest pain that started this morning. Pt is in NAD at this time.

## 2022-01-03 ENCOUNTER — Inpatient Hospital Stay: Payer: Medicare Other

## 2022-01-03 DIAGNOSIS — I10 Essential (primary) hypertension: Secondary | ICD-10-CM

## 2022-01-03 DIAGNOSIS — R079 Chest pain, unspecified: Secondary | ICD-10-CM

## 2022-01-03 DIAGNOSIS — D696 Thrombocytopenia, unspecified: Secondary | ICD-10-CM

## 2022-01-03 DIAGNOSIS — I700 Atherosclerosis of aorta: Secondary | ICD-10-CM

## 2022-01-03 DIAGNOSIS — K7030 Alcoholic cirrhosis of liver without ascites: Secondary | ICD-10-CM

## 2022-01-03 DIAGNOSIS — K8020 Calculus of gallbladder without cholecystitis without obstruction: Secondary | ICD-10-CM | POA: Diagnosis present

## 2022-01-03 DIAGNOSIS — K8521 Alcohol induced acute pancreatitis with uninfected necrosis: Secondary | ICD-10-CM

## 2022-01-03 DIAGNOSIS — E039 Hypothyroidism, unspecified: Secondary | ICD-10-CM

## 2022-01-03 LAB — LACTIC ACID, PLASMA: Lactic Acid, Venous: 1.9 mmol/L (ref 0.5–1.9)

## 2022-01-03 LAB — CBC
HCT: 26.3 % — ABNORMAL LOW (ref 36.0–46.0)
Hemoglobin: 8.2 g/dL — ABNORMAL LOW (ref 12.0–15.0)
MCH: 29.8 pg (ref 26.0–34.0)
MCHC: 31.2 g/dL (ref 30.0–36.0)
MCV: 95.6 fL (ref 80.0–100.0)
Platelets: 125 10*3/uL — ABNORMAL LOW (ref 150–400)
RBC: 2.75 MIL/uL — ABNORMAL LOW (ref 3.87–5.11)
RDW: 18.7 % — ABNORMAL HIGH (ref 11.5–15.5)
WBC: 7.7 10*3/uL (ref 4.0–10.5)
nRBC: 0.3 % — ABNORMAL HIGH (ref 0.0–0.2)

## 2022-01-03 LAB — COMPREHENSIVE METABOLIC PANEL
ALT: 22 U/L (ref 0–44)
AST: 86 U/L — ABNORMAL HIGH (ref 15–41)
Albumin: 2.4 g/dL — ABNORMAL LOW (ref 3.5–5.0)
Alkaline Phosphatase: 224 U/L — ABNORMAL HIGH (ref 38–126)
Anion gap: 9 (ref 5–15)
BUN: 11 mg/dL (ref 8–23)
CO2: 25 mmol/L (ref 22–32)
Calcium: 7.6 mg/dL — ABNORMAL LOW (ref 8.9–10.3)
Chloride: 108 mmol/L (ref 98–111)
Creatinine, Ser: 0.61 mg/dL (ref 0.44–1.00)
GFR, Estimated: >60 mL/min (ref >60)
Glucose, Bld: 91 mg/dL (ref 70–99)
Potassium: 3.7 mmol/L (ref 3.5–5.1)
Sodium: 142 mmol/L (ref 135–145)
Total Bilirubin: 0.9 mg/dL (ref 0.3–1.2)
Total Protein: 6.9 g/dL (ref 6.5–8.1)

## 2022-01-03 LAB — PROTIME-INR
INR: 1.3 — ABNORMAL HIGH (ref 0.8–1.2)
Prothrombin Time: 16.3 seconds — ABNORMAL HIGH (ref 11.4–15.2)

## 2022-01-03 LAB — APTT: aPTT: 48 seconds — ABNORMAL HIGH (ref 24–36)

## 2022-01-03 LAB — LIPASE, BLOOD: Lipase: 132 U/L — ABNORMAL HIGH (ref 11–51)

## 2022-01-03 MED ORDER — ALUM & MAG HYDROXIDE-SIMETH 200-200-20 MG/5ML PO SUSP: 15.0000 mL | ORAL | Status: DC | PRN

## 2022-01-03 MED ORDER — METOCLOPRAMIDE HCL 5 MG/ML IJ SOLN
10.0000 mg | Freq: Four times a day (QID) | INTRAMUSCULAR | Status: DC | PRN
  Administered 2022-01-03: 10 mg via INTRAVENOUS
  Filled 2022-01-03: qty 2

## 2022-01-03 MED ORDER — ONDANSETRON HCL 4 MG/2ML IJ SOLN
4.0000 mg | Freq: Four times a day (QID) | INTRAMUSCULAR | Status: DC
  Administered 2022-01-03 – 2022-01-07 (×12): 4 mg via INTRAVENOUS
  Filled 2022-01-03 (×12): qty 2

## 2022-01-03 MED ORDER — HEPARIN BOLUS VIA INFUSION
4000.0000 [IU] | Freq: Once | INTRAVENOUS | Status: AC
  Administered 2022-01-03: 4000 [IU] via INTRAVENOUS
  Filled 2022-01-03: qty 4000

## 2022-01-03 MED ORDER — HEPARIN (PORCINE) 25000 UT/250ML-% IV SOLN
1050.0000 [IU]/h | INTRAVENOUS | Status: AC
  Administered 2022-01-03: 1150 [IU]/h via INTRAVENOUS
  Administered 2022-01-04 – 2022-01-06 (×3): 1050 [IU]/h via INTRAVENOUS
  Filled 2022-01-03 (×4): qty 250

## 2022-01-03 MED ORDER — PANTOPRAZOLE SODIUM 40 MG IV SOLR
40.0000 mg | Freq: Two times a day (BID) | INTRAVENOUS | Status: DC
  Administered 2022-01-03 – 2022-01-05 (×5): 40 mg via INTRAVENOUS
  Filled 2022-01-03 (×5): qty 10

## 2022-01-03 MED ORDER — IOHEXOL 350 MG/ML SOLN
75.0000 mL | Freq: Once | INTRAVENOUS | Status: AC | PRN
  Administered 2022-01-03: 75 mL via INTRAVENOUS
  Filled 2022-01-03: qty 75

## 2022-01-03 NOTE — Assessment & Plan Note (Addendum)
-   Possibly from alcohol abuse.  Monitor.  Currently no signs of bleeding. -Platelets are improving to 141 today compared to 79 on 01/04/2022. Repeat a.m. labs.

## 2022-01-03 NOTE — Progress Notes (Addendum)
Cardiology Consultation:   Patient ID: Kathryn [name] MRN: [id number]; DOB: [birth year]  Admit date: 01/02/2022 Date of Consult: 01/03/2022  PCP:  Preston Fleeting, MD   [facility] Providers Cardiologist:  None        Patient Profile:   Kathryn [name] is a 66 y.o. female with a hx of asthma, hypertension, aortic atherosclerosis, hypothyroidism, alcoholism, cirrhosis, portal hypertension, esophageal varices, and sarcoidosis who is being seen 01/03/2022 for the evaluation of chest pain at the request of Dr. Nida Boatman.  History of Present Illness:   Ms. Godsil presented to the hospital 01/03/2012 with midsternal chest pressure with diaphoresis and radiation to her left arm.  She also reported numbness of the left side of her neck.  She reported 2 days of nausea and vomiting as well as left upper quadrant and epigastric tenderness.  She struggled with alcoholism.  She reported cutting down to 1 or 2 shots per day.  In the ED she was hypertensive to 163/79.  AST was 98.  Bilirubin was within normal limits.  Lipase was 368.  Hemoglobin was 8.2.  High-sensitivity troponin was 16-->12.  She was negative for influenza and COVID-19.  EKG was unremarkable.  Abdominal ultrasound was concerning for portal vein thrombosis and noted hepatic steatosis.  It is found cholelithiasis without cholecystitis.  Ms. Scholten reports that her chest pain started after recurrent episodes of emesis.  There is no change with ambulation.  She does have some chronic exertional dyspnea that is unchanged from her baseline.  She also has chronic lower extremity edema that seems little worse than usual.  She denies orthopnea or PND.  She reports that she still drinks "too much".  She denies any tobacco history.  She does report a family history of CAD.  Her brother and mother both had coronary artery disease.   Past Medical History:  Diagnosis Date   Alcoholic cirrhosis of liver (HCC)    Asthma    CHF (congestive heart failure)  (HCC)    Chronic disease anemia    Esophageal varices (HCC)    GR I on EGD by Dr Shelle Iron 09/2014   ETOH abuse    Hypertension    Hypothyroidism    Murmur    Portal hypertension (HCC)    Sarcoidosis     Past Surgical History:  Procedure Laterality Date   ESOPHAGOGASTRODUODENOSCOPY N/A 06/26/2021   Procedure: ESOPHAGOGASTRODUODENOSCOPY (EGD);  Surgeon: Toledo, Boykin Nearing, MD;  Location: [facility] ENDOSCOPY;  Service: Gastroenterology;  Laterality: N/A;   ESOPHAGOGASTRODUODENOSCOPY (EGD) WITH PROPOFOL N/A 04/04/2015   Procedure: ESOPHAGOGASTRODUODENOSCOPY (EGD) WITH PROPOFOL;  Surgeon: Midge Minium, MD;  Location: [facility] ENDOSCOPY;  Service: Endoscopy;  Laterality: N/A;  Dr Servando Snare prefers 1445   ESOPHAGOGASTRODUODENOSCOPY W/ BANDING  09/2014   Rein   TUBAL LIGATION       Home Medications:  Prior to Admission medications   Medication Sig Start Date End Date Taking? Authorizing Provider  amLODipine (NORVASC) 10 MG tablet Take 1 tablet (10 mg total) by mouth daily. 06/28/21 01/02/22 Yes Lynn Ito, MD  ferrous sulfate 325 (65 FE) MG tablet Take 325 mg by mouth every other day.   Yes [provider]  folic acid (FOLVITE) 1 MG tablet Take 1 mg by mouth daily.   Yes [provider]  levothyroxine (SYNTHROID) 175 MCG tablet Take 1 tablet (175 mcg total) by mouth daily before breakfast. 06/27/21 01/02/22 Yes Lynn Ito, MD  Multiple Vitamin (MULTIVITAMIN) tablet Take 1 tablet by mouth every other day.  Yes [provider]  nadolol (CORGARD) 40 MG tablet Take 40 mg by mouth daily. 09/25/21  Yes [provider]  spironolactone (ALDACTONE) 100 MG tablet Take 100 mg by mouth daily. 09/25/21  Yes [provider]  thiamine 100 MG tablet Take 100 mg by mouth daily. 09/25/21  Yes [provider]  pantoprazole (PROTONIX) 40 MG tablet Take 1 tablet (40 mg total) by mouth 2 (two) times daily. Patient not taking: Reported on 01/02/2022 06/27/21 01/02/22  Lynn Ito, MD     Inpatient Medications: Scheduled Meds:  amLODipine  10 mg Oral Daily   enoxaparin (LOVENOX) injection  40 mg Subcutaneous Q24H   ferrous sulfate  325 mg Oral QODAY   folic acid  1 mg Oral Daily   levothyroxine  175 mcg Oral Q0600   multivitamin with minerals  1 tablet Oral QODAY   nadolol  40 mg Oral Daily   pantoprazole (PROTONIX) IV  40 mg Intravenous Q12H   spironolactone  100 mg Oral Daily   thiamine  100 mg Oral Daily   Continuous Infusions:  dextrose 5 % and 0.9% NaCl 100 mL/hr at 01/02/22 2328   PRN Meds: acetaminophen **OR** acetaminophen, alum & mag hydroxide-simeth, magnesium hydroxide, morphine injection, ondansetron **OR** ondansetron (ZOFRAN) IV, traZODone  Allergies:    Allergies  Allergen Reactions   Hydrocodone-Acetaminophen Other (See Comments)    Reaction: pt can't take anything with tylenol due to her liver disease.    Ibuprofen Other (See Comments)    Esophageal varices   Naproxen Other (See Comments)    Esophageal varices   Propoxyphene Other (See Comments)    Reaction: pt isn't sure, but knows she can't take it.    Shellfish Allergy Hives   Tamiflu  [Oseltamivir Phosphate] Other (See Comments)    Esophageal varices    Social History:   Social History   Socioeconomic History   Marital status: Divorced    Spouse name: Not on file   Number of children: Not on file   Years of education: Not on file   Highest education level: Not on file  Occupational History   Not on file  Tobacco Use   Smoking status: Never   Smokeless tobacco: Never  Vaping Use   Vaping Use: Never used  Substance and Sexual Activity   Alcohol use: Yes    Alcohol/week: 15.0 standard drinks    Types: 15 Shots of liquor per week   Drug use: No   Sexual activity: Not on file  Other Topics Concern   Not on file  Social History Narrative   She lives with 2 grandsons   Social Determinants of Health   Financial Resource Strain: Not on file  Food Insecurity: Not on  file  Transportation Needs: Not on file  Physical Activity: Not on file  Stress: Not on file  Social Connections: Not on file  Intimate Partner Violence: Not on file    Family History:    Family History  Problem Relation Age of Onset   Aneurysm Father    COPD Father    Heart attack Mother      ROS:  Please see the history of present illness.  All other ROS reviewed and negative.     Physical Exam/Data:   Vitals:   01/03/22 0600 01/03/22 0630 01/03/22 0700 01/03/22 0715  BP: 130/70 (!) 142/75 (!) 146/71   Pulse: 78 76 77 77  Resp: 11 10 10 13   Temp:      TempSrc:  SpO2: 92% 95% 92% 95%  Weight:      Height:       No intake or output data in the 24 hours ending 01/03/22 0909 Last 3 Weights 01/02/2022 06/27/2021 06/26/2021  Weight (lbs) 180 lb 178 lb 177 lb 4.8 oz  Weight (kg) 81.647 kg 80.74 kg 80.423 kg     VS:  BP (!) 146/71    Pulse 77    Temp 97.8 F (36.6 C) (Oral)    Resp 13    Ht 5\' 4"  (1.626 m)    Wt 81.6 kg    SpO2 95%    BMI 30.90 kg/m  , BMI Body mass index is 30.9 kg/m. GENERAL:  Well appearing HEENT: Pupils equal round and reactive, fundi not visualized, oral mucosa unremarkable NECK:  No jugular venous distention, waveform within normal limits, carotid upstroke brisk and symmetric, no bruits, no thyromegaly LUNGS:  Clear to auscultation bilaterally HEART:  RRR.  PMI not displaced or sustained,S1 and S2 within normal limits, no S3, no S4, no clicks, no rubs, no murmurs ABD:  Flat, positive bowel sounds normal in frequency in pitch, no bruits, no rebound, no guarding, no midline pulsatile mass, no hepatomegaly, no splenomegaly EXT:  2 plus pulses throughout, no edema, no cyanosis no clubbing SKIN:  No rashes no nodules NEURO:  Cranial nerves II through XII grossly intact, motor grossly intact throughout PSYCH:  Cognitively intact, oriented to person place and time   EKG:  The EKG was personally reviewed and demonstrates:  Sinus rhythm.  Rate 73 bpm.   Nonspecific T wave abnormalities. Telemetry:  Telemetry was personally reviewed and demonstrates: Sinus rhythm.  No events.  Relevant CV Studies: Echo 06/2021:  1. Left ventricular ejection fraction, by estimation, is 60 to 65%. The  left ventricle has normal function. The left ventricle has no regional  wall motion abnormalities. There is moderate left ventricular hypertrophy.  Left ventricular diastolic  parameters are consistent with Grade I diastolic dysfunction (impaired  relaxation).   2. Right ventricular systolic function is normal. The right ventricular  size is normal.   3. Left atrial size was moderately dilated.   4. The mitral valve is normal in structure. Mild to moderate mitral valve  regurgitation.   Laboratory Data:  High Sensitivity Troponin:   Recent Labs  Lab 01/02/22 1622 01/02/22 1807  TROPONINIHS 16 12     Chemistry Recent Labs  Lab 01/02/22 1622 01/03/22 0629  NA 142 142  K 3.9 3.7  CL 106 108  CO2 22 25  GLUCOSE 65* 91  BUN 10 11  CREATININE 0.56 0.61  CALCIUM 8.0* 7.6*  GFRNONAA >60 >60  ANIONGAP 14 9    Recent Labs  Lab 01/02/22 1630 01/03/22 0629  PROT 7.8 6.9  ALBUMIN 2.6* 2.4*  AST 98* 86*  ALT 24 22  ALKPHOS 233* 224*  BILITOT 1.3* 0.9   Lipids No results for input(s): CHOL, TRIG, HDL, LABVLDL, LDLCALC, CHOLHDL in the last 168 hours.  Hematology Recent Labs  Lab 01/02/22 1622 01/03/22 0629  WBC 7.4 7.7  RBC 3.50* 2.75*  HGB 10.8* 8.2*  HCT 33.8* 26.3*  MCV 96.6 95.6  MCH 30.9 29.8  MCHC 32.0 31.2  RDW 18.6* 18.7*  PLT 171 125*   Thyroid No results for input(s): TSH, FREET4 in the last 168 hours.  BNPNo results for input(s): BNP, PROBNP in the last 168 hours.  DDimer No results for input(s): DDIMER in the last 168 hours.  Radiology/Studies:  DG Chest 2 View  Result Date: 01/02/2022 CLINICAL DATA:  Chest pain. EXAM: CHEST - 2 VIEW COMPARISON:  June 24, 2021. FINDINGS: The heart size and mediastinal contours are  within normal limits. Both lungs are clear. The visualized skeletal structures are unremarkable. IMPRESSION: No active cardiopulmonary disease. Electronically Signed   By: Lupita RaiderJames  Green Jr M.D.   On: 01/02/2022 16:49   CT HEAD WO CONTRAST (5MM)  Result Date: 01/02/2022 CLINICAL DATA:  Neurologic deficit. EXAM: CT HEAD WITHOUT CONTRAST TECHNIQUE: Contiguous axial images were obtained from the base of the skull through the vertex without intravenous contrast. RADIATION DOSE REDUCTION: This exam was performed according to the departmental dose-optimization program which includes automated exposure control, adjustment of the mA and/or kV according to patient size and/or use of iterative reconstruction technique. COMPARISON:  Head CT dated 10/26/2019. FINDINGS: Brain: Mild age-related atrophy and chronic microvascular ischemic changes. There is no acute intracranial hemorrhage. No mass effect or midline shift. No extra-axial fluid collection. Vascular: No hyperdense vessel or unexpected calcification. Skull: Normal. Negative for fracture or focal lesion. Sinuses/Orbits: There is opacification of several left mastoid air cells. The right mastoid air cells and the visualized paranasal sinuses are clear. Other: None IMPRESSION: 1. No acute intracranial pathology. 2. Partial left mastoid effusion. Electronically Signed   By: Elgie CollardArash  Radparvar M.D.   On: 01/02/2022 19:12   US Abdomen Limited RUQ (LIVER/GB)  Result Date: 01/02/2022 CLINICAL DATA:  Acute pancreatitis. EXAM: ULTRASOUND ABDOMEN LIMITED RIGHT UPPER QUADRANT COMPARISON:  None. FINDINGS: Gallbladder: Gallbladder stones noted within the gallbladder lumen. No gallbladder wall thickening or pericholecystic fluid visualized. No sonographic Murphy sign noted by sonographer. Common bile duct: Diameter: 4 mm. Liver: No focal lesion identified. Increased parenchymal echogenicity. Partially absent color Doppler flow of the portal vein with otherwise normal direction of  blood flow towards the liver. Other: None. IMPRESSION: 1. Query nonocclusive portal vein thrombosis. 2. Hepatic steatosis. Please note limited evaluation for focal hepatic masses in a patient with hepatic steatosis due to decreased penetration of the acoustic ultrasound waves. 3. Cholelithiasis with no acute cholecystitis. Electronically Signed   By: Tish FredericksonMorgane  Naveau M.D.   On: 01/02/2022 22:42     Assessment and Plan:   Echo 06/25/2021:   1. Left ventricular ejection fraction, by estimation, is 60 to 65%. The  left ventricle has normal function. The left ventricle has no regional  wall motion abnormalities. There is moderate left ventricular hypertrophy.  Left ventricular diastolic  parameters are consistent with Grade I diastolic dysfunction (impaired  relaxation).   2. Right ventricular systolic function is normal. The right ventricular  size is normal.   3. Left atrial size was moderately dilated.   4. The mitral valve is normal in structure. Mild to moderate mitral valve  regurgitation.    Risk Assessment/Risk Scores:     HEAR Score (for undifferentiated chest pain):       #Chest pain: [name] presented with chest pain.  Ultimately she has been found to have pancreatitis.  Her symptoms are very atypical and more likely related to her portal vein thrombosis, recurrent emesis and pancreatitis than ischemia.  We will get an echo, especially in light of her increased lower extremity edema.  Her echo in August was fairly unremarkable other than grade 1 diastolic dysfunction.  As long as her systolic function remains intact, would recommend outpatient stress test.  She is currently anemic with a hemoglobin of 8.2 and has known esophageal varices and portal  hypertension.  She would not be a very good cardiac catheterization candidate at this time.  Cardiac enzymes are unremarkable.    # Aortic atherosclerosis: #Hyperlipidemia: Noted on prior abdominal imaging.  LDL was 111 on 06/2021.  Her  LDL should be less than 70.  Given her cirrhosis and pancreatitis would not recommend a statin at this time.  Can be reevaluated as an outpatient.  #Hypertension: Blood pressure poorly controlled.  Her home amlodipine, nadolol, and spironolactone are due this morning.  Continue to monitor for now.     For questions or updates, please contact [facility] Please consult [email or web address] for contact info under    Signed, Chilton Siiffany Harper, MD  01/03/2022 9:09 AM

## 2022-01-03 NOTE — Assessment & Plan Note (Signed)
Continue Synthroid °

## 2022-01-03 NOTE — Assessment & Plan Note (Addendum)
Possibly acute alcoholic pancreatitis -Last alcoholic drink was 2 days prior to presentation.  Lipase 368 on presentation.  Lipase improving. -Patient was counseled regarding abstinence from alcohol by admitting hospitalist -Pain improving.  GI following.  DC'd IV fluids on 01/05/2022.  She had some vomiting on 01/05/2022: Improved since then.  We will monitor her symptoms with advanced diet today.

## 2022-01-03 NOTE — Assessment & Plan Note (Addendum)
-   Might be related to pancreatitis and portal vein thrombosis -Troponins have not trended upwards. -Cardiology signed off on 01/04/2022: Currently being managed conservatively and recommending outpatient stress test.  Normal systolic function on echo -Currently no chest pain.

## 2022-01-03 NOTE — ED Notes (Signed)
Pt resting quietly in bed with eyes closed, no acute distress noted, respirations regular even and non-labored, IV fluids infusing without difficulty.

## 2022-01-03 NOTE — Assessment & Plan Note (Addendum)
Possibly alcoholic cirrhosis of liver with ascites with history of portal hypertension and esophageal varices Elevated LFTs Possible nonocclusive portal vein thrombosis -Strict input, daily weights.  Trend LFTs. -GI following. -There was no significant ascites for ultrasound-guided paracentesis on 01/05/2022

## 2022-01-03 NOTE — Assessment & Plan Note (Addendum)
-   Blood pressure currently stable.  Continue amlodipine, hydralazine, spironolactone and nadolol.

## 2022-01-03 NOTE — Assessment & Plan Note (Signed)
-   will possibly need outpatient general surgery evaluation

## 2022-01-03 NOTE — Care Plan (Signed)
Reviewed imaging with acute portal vein clot with extension into smv likely secondary from pancreatitis. Obviously given her history of cirrhosis with varices and portal hypertensive gastropathy she is at risk for bleeding but also at risk for worsening portal hypertension and worsening varices if acute clot is not treated. Varices banded to eradication in August and patient is on beta blocker. Benefits of anticoagulation likely outweigh risks at this point. Lactic acid is normal. I touched base with vascular surgery as well who will see her tomorrow.  Merlyn Lot MD, MPH [facility]

## 2022-01-03 NOTE — Consult Note (Signed)
Consultation  Referring Provider:     Hospitalist Admit date: 2/10 Consult date: 2/10         Reason for Consultation:   Partial portal vein thrombosis           HPI:   Kathryn [name] is a 66 y.o. 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 Patient states pain started yesterday including nausea/vomiting. No hematemesis. Patient has been drinking a couple of shots every other day and has been doing this for years. On presentation an ultrasound showed possible non-obstructive thrombosis but otherwise normal direction flow of blood. She has not followed up with a GI provider since receiving variceal ligation in August of last year while being hospitalized. She denies smoking. Endorses lower abdominal pain that wraps around to her back. Bilirubin is normal and CBD duct is normal in size so no eveidence of obstruction.  Past Medical History:  Diagnosis Date   Alcoholic cirrhosis of liver (HCC)    Asthma    CHF (congestive heart failure) (HCC)    Chronic disease anemia    Esophageal varices (HCC)    GR I on EGD by Dr Shelle Iron 09/2014   ETOH abuse    Hypertension    Hypothyroidism    Murmur    Portal hypertension (HCC)    Sarcoidosis     Past Surgical History:  Procedure Laterality Date   ESOPHAGOGASTRODUODENOSCOPY N/A 06/26/2021   Procedure: ESOPHAGOGASTRODUODENOSCOPY (EGD);  Surgeon: Toledo, Boykin Nearing, MD;  Location: [facility] ENDOSCOPY;  Service: Gastroenterology;  Laterality: N/A;   ESOPHAGOGASTRODUODENOSCOPY (EGD) WITH PROPOFOL N/A 04/04/2015   Procedure: ESOPHAGOGASTRODUODENOSCOPY (EGD) WITH PROPOFOL;  Surgeon: Midge Minium, MD;  Location: [facility] ENDOSCOPY;  Service: Endoscopy;  Laterality: N/A;  Dr Servando Snare prefers 1445   ESOPHAGOGASTRODUODENOSCOPY W/ BANDING  09/2014   [place]   TUBAL LIGATION      Family History  Problem Relation Age of Onset   Aneurysm Father     COPD Father    Heart attack Mother     Social History   Tobacco Use   Smoking status: Never   Smokeless tobacco: Never  Vaping Use   Vaping Use: Never used  Substance Use Topics   Alcohol use: Yes    Alcohol/week: 15.0 standard drinks    Types: 15 Shots of liquor per week   Drug use: No    Prior to Admission medications   Medication Sig Start Date End Date Taking? Authorizing Provider  amLODipine (NORVASC) 10 MG tablet Take 1 tablet (10 mg total) by mouth daily. 06/28/21 01/02/22 Yes Lynn Ito, MD  ferrous sulfate 325 (65 FE) MG tablet Take 325 mg by mouth every other day.   Yes [provider]  folic acid (FOLVITE) 1 MG tablet Take 1 mg by mouth daily.   Yes [provider]  levothyroxine (SYNTHROID) 175 MCG tablet Take 1 tablet (175 mcg total) by mouth daily before breakfast. 06/27/21 01/02/22 Yes Lynn Ito, MD  Multiple Vitamin (MULTIVITAMIN) tablet Take 1 tablet by mouth every other day.   Yes [provider]  nadolol (CORGARD) 40 MG tablet Take 40 mg by mouth daily. 09/25/21  Yes [provider]  spironolactone (ALDACTONE) 100 MG tablet Take 100 mg by mouth daily. 09/25/21  Yes [provider]  thiamine 100 MG tablet Take 100 mg by mouth daily. 09/25/21  Yes [provider]  pantoprazole (PROTONIX) 40 MG tablet Take 1 tablet (  40 mg total) by mouth 2 (two) times daily. Patient not taking: Reported on 01/02/2022 06/27/21 01/02/22  Lynn Ito, MD    Current Facility-Administered Medications  Medication Dose Route Frequency Provider Last Rate Last Admin   acetaminophen (TYLENOL) tablet 650 mg  650 mg Oral Q6H PRN Mansy, Jan A, MD       Or   acetaminophen (TYLENOL) suppository 650 mg  650 mg Rectal Q6H PRN Mansy, Vernetta Honey, MD       alum & mag hydroxide-simeth (MAALOX/MYLANTA) 200-200-20 MG/5ML suspension 15 mL  15 mL Oral Q4H PRN Hanley Ben, Kshitiz, MD       amLODipine (NORVASC) tablet 10 mg  10 mg Oral Daily Mansy, Jan A, MD   10 mg at  01/03/22 0952   dextrose 5 %-0.9 % sodium chloride infusion   Intravenous Continuous Mansy, Jan A, MD 100 mL/hr at 01/02/22 2328 New Bag at 01/02/22 2328   enoxaparin (LOVENOX) injection 40 mg  40 mg Subcutaneous Q24H Mansy, Jan A, MD   40 mg at 01/03/22 0951   ferrous sulfate tablet 325 mg  325 mg Oral QODAY Mansy, Jan A, MD   325 mg at 01/03/22 1219   folic acid (FOLVITE) tablet 1 mg  1 mg Oral Daily Mansy, Jan A, MD   1 mg at 01/03/22 7588   levothyroxine (SYNTHROID) tablet 175 mcg  175 mcg Oral Q0600 Mansy, Jan A, MD   175 mcg at 01/03/22 0546   magnesium hydroxide (MILK OF MAGNESIA) suspension 30 mL  30 mL Oral Daily PRN Mansy, Jan A, MD       morphine (PF) 2 MG/ML injection 2 mg  2 mg Intravenous Q2H PRN Mansy, Jan A, MD   2 mg at 01/03/22 1129   multivitamin with minerals tablet 1 tablet  1 tablet Oral QODAY Mansy, Jan A, MD   1 tablet at 01/03/22 0952   nadolol (CORGARD) tablet 40 mg  40 mg Oral Daily Mansy, Jan A, MD   40 mg at 01/03/22 1134   ondansetron (ZOFRAN) tablet 4 mg  4 mg Oral Q6H PRN Mansy, Jan A, MD       Or   ondansetron [facility]) injection 4 mg  4 mg Intravenous Q6H PRN Mansy, Jan A, MD   4 mg at 01/03/22 1205   pantoprazole (PROTONIX) injection 40 mg  40 mg Intravenous Q12H Glade Lloyd, MD   40 mg at 01/03/22 0951   spironolactone (ALDACTONE) tablet 100 mg  100 mg Oral Daily Mansy, Jan A, MD   100 mg at 01/03/22 3254   thiamine tablet 100 mg  100 mg Oral Daily Mansy, Jan A, MD   100 mg at 01/03/22 0951   traZODone (DESYREL) tablet 25 mg  25 mg Oral QHS PRN Mansy, Vernetta Honey, MD       Current Outpatient Medications  Medication Sig Dispense Refill   amLODipine (NORVASC) 10 MG tablet Take 1 tablet (10 mg total) by mouth daily. 30 tablet 0   ferrous sulfate 325 (65 FE) MG tablet Take 325 mg by mouth every other day.     folic acid (FOLVITE) 1 MG tablet Take 1 mg by mouth daily.     levothyroxine (SYNTHROID) 175 MCG tablet Take 1 tablet (175 mcg total) by mouth daily before  breakfast. 30 tablet 0   Multiple Vitamin (MULTIVITAMIN) tablet Take 1 tablet by mouth every other day.     nadolol (CORGARD) 40 MG tablet Take 40 mg by mouth daily.  spironolactone (ALDACTONE) 100 MG tablet Take 100 mg by mouth daily.     thiamine 100 MG tablet Take 100 mg by mouth daily.     pantoprazole (PROTONIX) 40 MG tablet Take 1 tablet (40 mg total) by mouth 2 (two) times daily. (Patient not taking: Reported on 01/02/2022) 60 tablet 0    Allergies as of 01/02/2022 - Review Complete 01/02/2022  Allergen Reaction Noted   Hydrocodone-acetaminophen Other (See Comments) 04/04/2015   Ibuprofen Other (See Comments) 04/04/2015   Naproxen Other (See Comments) 04/04/2015   Propoxyphene Other (See Comments) 04/04/2015   Shellfish allergy Hives 10/26/2019   Tamiflu  [oseltamivir phosphate] Other (See Comments) 04/04/2015     Review of Systems:      Review of Systems  Constitutional:  Negative for chills and fever.  Respiratory:  Negative for shortness of breath.   Cardiovascular:  Positive for leg swelling.  Gastrointestinal:  Positive for abdominal pain, nausea and vomiting. Negative for blood in stool and melena.  Musculoskeletal:  Positive for back pain.  Skin:  Negative for rash.  Neurological:  Negative for focal weakness.  Psychiatric/Behavioral:  Positive for substance abuse.   All other systems reviewed and are negative.     Physical Exam:  Vital signs in last 24 hours: Temp:  [97.8 F (36.6 C)-97.9 F (36.6 C)] 97.9 F (36.6 C) (02/11 1014) Pulse Rate:  [72-99] 78 (02/11 1014) Resp:  [10-22] 16 (02/11 1014) BP: (114-177)/(70-95) 177/95 (02/11 1014) SpO2:  [91 %-100 %] 99 % (02/11 1014) Weight:  [81.6 kg] 81.6 kg (02/10 1618)   General:   In mild distress from abdominal pain Head:  Normocephalic and atraumatic. Eyes:   No icterus.   Conjunctiva pink. Mouth: Mucosa pink moist, no lesions. Neck:  Supple; no masses felt Lungs:  No respiratory distress Abdomen:    Flat, soft, nondistended, mildly tender Msk:  No clubbing or cyanosis. Strength 5/5. Symmetrical without gross deformities. 1+ edema in lower extremities Neurologic:  Alert and  oriented x4;  Cranial nerves II-XII intact.  Skin:  Warm, dry, pink without significant lesions or rashes. Psych:  Alert and cooperative. Normal affect.  LAB RESULTS: Recent Labs    01/02/22 1622 01/03/22 0629  WBC 7.4 7.7  HGB 10.8* 8.2*  HCT 33.8* 26.3*  PLT 171 125*   BMET Recent Labs    01/02/22 1622 01/03/22 0629  NA 142 142  K 3.9 3.7  CL 106 108  CO2 22 25  GLUCOSE 65* 91  BUN 10 11  CREATININE 0.56 0.61  CALCIUM 8.0* 7.6*   LFT Recent Labs    01/02/22 1630 01/03/22 0629  PROT 7.8 6.9  ALBUMIN 2.6* 2.4*  AST 98* 86*  ALT 24 22  ALKPHOS 233* 224*  BILITOT 1.3* 0.9  BILIDIR 0.5*  --   IBILI 0.8  --    PT/INR Recent Labs    01/03/22 0629  LABPROT 16.3*  INR 1.3*    STUDIES: DG Chest 2 View  Result Date: 01/02/2022 CLINICAL DATA:  Chest pain. EXAM: CHEST - 2 VIEW COMPARISON:  June 24, 2021. FINDINGS: The heart size and mediastinal contours are within normal limits. Both lungs are clear. The visualized skeletal structures are unremarkable. IMPRESSION: No active cardiopulmonary disease. Electronically Signed   By: Lupita Raider M.D.   On: 01/02/2022 16:49   CT HEAD WO CONTRAST ( )  Result Date: 01/02/2022 CLINICAL DATA:  Neurologic deficit. EXAM: CT HEAD WITHOUT CONTRAST TECHNIQUE: Contiguous axial images were obtained from the  base of the skull through the vertex without intravenous contrast. RADIATION DOSE REDUCTION: This exam was performed according to the departmental dose-optimization program which includes automated exposure control, adjustment of the mA and/or kV according to patient size and/or use of iterative reconstruction technique. COMPARISON:  Head CT dated 10/26/2019. FINDINGS: Brain: Mild age-related atrophy and chronic microvascular ischemic changes. There is  no acute intracranial hemorrhage. No mass effect or midline shift. No extra-axial fluid collection. Vascular: No hyperdense vessel or unexpected calcification. Skull: Normal. Negative for fracture or focal lesion. Sinuses/Orbits: There is opacification of several left mastoid air cells. The right mastoid air cells and the visualized paranasal sinuses are clear. Other: None IMPRESSION: 1. No acute intracranial pathology. 2. Partial left mastoid effusion. Electronically Signed   By: Elgie CollardArash  Radparvar M.D.   On: 01/02/2022 19:12   US Abdomen Limited RUQ (LIVER/GB)  Result Date: 01/02/2022 CLINICAL DATA:  Acute pancreatitis. EXAM: ULTRASOUND ABDOMEN LIMITED RIGHT UPPER QUADRANT COMPARISON:  None. FINDINGS: Gallbladder: Gallbladder stones noted within the gallbladder lumen. No gallbladder wall thickening or pericholecystic fluid visualized. No sonographic Murphy sign noted by sonographer. Common bile duct: Diameter: 4 mm. Liver: No focal lesion identified. Increased parenchymal echogenicity. Partially absent color Doppler flow of the portal vein with otherwise normal direction of blood flow towards the liver. Other: None. IMPRESSION: 1. Query nonocclusive portal vein thrombosis. 2. Hepatic steatosis. Please note limited evaluation for focal hepatic masses in a patient with hepatic steatosis due to decreased penetration of the acoustic ultrasound waves. 3. Cholelithiasis with no acute cholecystitis. Electronically Signed   By: Tish FredericksonMorgane  Naveau M.D.   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 If present would suspect chronic which would require no acute intervention -  continue pain control for pancreatitis - fluids at 1.5 ml/kg/hr - counseled on alcohol cessation - continue beta blocker for variceal prophylaxis - advance diet as tolerated ( meaning no pain when eating)  Please call with any questions or concerns.  Merlyn Lotrevor Kyah Buesing MD, MPH [facility]

## 2022-01-03 NOTE — Progress Notes (Signed)
Progress Note   Patient: Kathryn [name] XBJ:[id number] DOB: April 29, 1956 DOA: 01/02/2022     1 DOS: the patient was seen and examined on 01/03/2022   Brief hospital course: 66 year old female with history of asthma, CHF, hypertension, hypothyroidism, alcoholic liver cirrhosis with portal hypertension/esophageal varices, sarcoidosis presented with chest pain, diaphoresis along with mild left upper quadrant and epigastric pain.  Her last alcoholic drink was 2 days prior to presentation.  On presentation, AST was 98, total bilirubin of 1.3, alkaline phosphatase 233, lipase of 368 along with high-sensitivity troponins of 16 and subsequently 12.  CT of the head showed partial left mastoid effusion with no acute intracranial pathology. Right upper quadrant ultrasound showed query nonocclusive portal vein thrombosis, hepatic steatosis, cholelithiasis with no acute cholecystitis.  She was started on IV fluids.  Assessment and Plan: * Acute pancreatitis- (present on admission) Possibly acute alcoholic pancreatitis -Last alcoholic drink was 2 days prior to presentation.  Lipase 368 on presentation.  Lipase improving. -Patient was counseled regarding abstinence from alcohol by admitting hospitalist -Currently on IV fluids. -Pain management.  Advance diet as tolerated.  Chest pain- (present on admission) - Might be related to pancreatitis but will have to rule out ACS. -Troponins have not trended upwards. -Cardiology team was notified by admitting hospitalist.  Follow recommendations.  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  Trend LFTs. -We will discuss with GI regarding [place] nonocclusive portal vein thrombosis management.  Hypertension- (present on admission) - Monitor blood pressure.  Continue amlodipine,  spironolactone and nadolol.  Hypothyroid- (present on admission) - Continue Synthroid  Cholelithiasis without cholecystitis- (present on admission) - will possibly need outpatient general surgery evaluation  Thrombocytopenia (HCC)- (present on admission) - Possibly from alcohol abuse.  Monitor.  Currently no signs of bleeding.  Anemia- (present on admission) - Possibly anemia of chronic disease from chronic alcohol use.  Hemoglobin currently stable.  Monitor intermittently.        Subjective:  Patient seen and examined at bedside.  Does not feel well and still feels nauseous with upper abdominal pain.  Denies any current chest pain.  No fever, worsening shortness of breath reported.  Physical Exam: Vitals:   01/03/22 0600 01/03/22 0630 01/03/22 0700 01/03/22 0715  BP: 130/70 (!) 142/75 (!) 146/71   Pulse: 78 76 77 77  Resp: 11 10 10 13   Temp:      TempSrc:      SpO2: 92% 95% 92% 95%  Weight:      Height:       General: No acute distress, currently on room air.  Looks chronically ill and deconditioned. ENT/neck: No elevated JVD.  No obvious masses  respiratory: Bilateral decreased breath sounds at bases with some scattered crackles CVS: S1-S2 heard, rate controlled Abdominal: Soft, mildly tender in the epigastric region, nondistended, no organomegaly, bowel sounds heard Extremities: No cyanosis, clubbing; bilateral lower extremity edema present  CNS: Alert, awake and oriented.  No focal neurologic deficit.  Moving extremities. Lymph: No cervical lymphadenopathy Skin: No rashes, lesions, ulcers Psych: Affect is mostly flat.  No signs of agitation.   Musculoskeletal: No obvious joint deformity/tenderness/swelling   Data Reviewed: I have reviewed the labs from admission myself.  Today's pertinent labs include alkaline phosphatase of 224, lipase of 132, AST of 86, hemoglobin of 8.2, platelets of 125 and INR of 1.3  Family Communication: None  at  bedside  Disposition: Status is: Inpatient Remains inpatient appropriate because: Of need for GI/cardiology evaluation.  Patient still nauseous with abdominal pain.    Planned Discharge Destination: Home     Time spent: 50 minutes  Author: Glade Lloyd, MD 01/03/2022 9:45 AM  For on call review [email or web address].

## 2022-01-03 NOTE — Consult Note (Signed)
ANTICOAGULATION CONSULT NOTE - Initial Consult  Pharmacy Consult for heparin Indication: DVT  Allergies  Allergen Reactions   Hydrocodone-Acetaminophen Other (See Comments)    Reaction: pt can't take anything with tylenol due to her liver disease.    Ibuprofen Other (See Comments)    Esophageal varices   Naproxen Other (See Comments)    Esophageal varices   Propoxyphene Other (See Comments)    Reaction: pt isn't sure, but knows she can't take it.    Shellfish Allergy Hives   Tamiflu  [Oseltamivir Phosphate] Other (See Comments)    Esophageal varices    Patient Measurements: Height: 5\' 4"  (162.6 cm) Weight: 81.6 kg (180 lb) IBW/kg (Calculated) : 54.7 Heparin Dosing Weight: 72.4 kg  Vital Signs: Temp: 98.4 F (36.9 C) (02/11 1547) Temp Source: Oral (02/11 1547) BP: 165/90 (02/11 1547) Pulse Rate: 79 (02/11 1547)  Labs: Recent Labs    01/02/22 1622 01/02/22 1807 01/03/22 0629  HGB 10.8*  --  8.2*  HCT 33.8*  --  26.3*  PLT 171  --  125*  APTT  --   --  48*  LABPROT  --   --  16.3*  INR  --   --  1.3*  CREATININE 0.56  --  0.61  TROPONINIHS 16 12  --     Estimated Creatinine Clearance: 72.5 mL/min (by C-G formula based on SCr of 0.61 mg/dL).   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 CT liver significant for acute near occlusive thrombus in the portal vein currently extending into the  upper SMV and with occlusion of the splenic vein. Pharmacy has been consulted for heparin dosing.   Baseline labs: Hgb 8.2, plts 125, aPTT 48, INR 1.3  Goal of Therapy:  Heparin level 0.3-0.7 units/ml Monitor platelets by anticoagulation protocol: Yes   Plan:  Give 4000 units bolus x 1 Start heparin infusion at 1150 units/hr Check anti-Xa level in 6 hours and daily while on heparin Continue to monitor H&H and platelets  76, PharmD 01/03/2022,5:38 PM

## 2022-01-03 NOTE — Hospital Course (Addendum)
66 year old female with history of asthma, CHF, hypertension, hypothyroidism, alcoholic liver cirrhosis with portal hypertension/esophageal varices, sarcoidosis presented with chest pain, diaphoresis along with mild left upper quadrant and epigastric pain.  Her last alcoholic drink was 2 days prior to presentation.  On presentation, AST was 98, total bilirubin of 1.3, alkaline phosphatase 233, lipase of 368 along with high-sensitivity troponins of 16 and subsequently 12.  CT of the head showed partial left mastoid effusion with no acute intracranial pathology. Right upper quadrant ultrasound showed query nonocclusive portal vein thrombosis, hepatic steatosis, cholelithiasis with no acute cholecystitis.  She was started on IV fluids.  GI and cardiology were consulted.  CT of the abdomen with contrast showed portal vein thrombosis with possible extension into SMV.  She was started on heparin drip as per GI recommendations.  Vascular surgery recommended conservative treatment with anticoagulation.

## 2022-01-03 NOTE — Assessment & Plan Note (Signed)
-   Possibly anemia of chronic disease from chronic alcohol use.  Hemoglobin currently stable.  Monitor intermittently.

## 2022-01-03 NOTE — ED Notes (Signed)
RN called to bedside, pt c/o abdominal pain rated 8/10 and mild nausea. Patient medicated per MAR, and repositioned for comfort. Patient denies other needs at this time.

## 2022-01-04 ENCOUNTER — Inpatient Hospital Stay (HOSPITAL_COMMUNITY)
Admit: 2022-01-04 | Discharge: 2022-01-04 | Disposition: A | Discharge status: Home / Self Care | Payer: Medicare Other | Attending: Cardiovascular Disease | Admitting: Cardiovascular Disease

## 2022-01-04 DIAGNOSIS — R5381 Other malaise: Secondary | ICD-10-CM | POA: Diagnosis present

## 2022-01-04 DIAGNOSIS — K7031 Alcoholic cirrhosis of liver with ascites: Secondary | ICD-10-CM

## 2022-01-04 DIAGNOSIS — R079 Chest pain, unspecified: Secondary | ICD-10-CM

## 2022-01-04 DIAGNOSIS — K852 Alcohol induced acute pancreatitis without necrosis or infection: Secondary | ICD-10-CM

## 2022-01-04 DIAGNOSIS — I81 Portal vein thrombosis: Secondary | ICD-10-CM | POA: Diagnosis present

## 2022-01-04 LAB — COMPREHENSIVE METABOLIC PANEL
ALT: 22 U/L (ref 0–44)
AST: 76 U/L — ABNORMAL HIGH (ref 15–41)
Albumin: 2.3 g/dL — ABNORMAL LOW (ref 3.5–5.0)
Alkaline Phosphatase: 205 U/L — ABNORMAL HIGH (ref 38–126)
Anion gap: 6 (ref 5–15)
BUN: 10 mg/dL (ref 8–23)
CO2: 27 mmol/L (ref 22–32)
Calcium: 7.7 mg/dL — ABNORMAL LOW (ref 8.9–10.3)
Chloride: 107 mmol/L (ref 98–111)
Creatinine, Ser: 0.62 mg/dL (ref 0.44–1.00)
GFR, Estimated: >60 mL/min (ref >60)
Glucose, Bld: 118 mg/dL — ABNORMAL HIGH (ref 70–99)
Potassium: 3.6 mmol/L (ref 3.5–5.1)
Sodium: 140 mmol/L (ref 135–145)
Total Bilirubin: 1.5 mg/dL — ABNORMAL HIGH (ref 0.3–1.2)
Total Protein: 7 g/dL (ref 6.5–8.1)

## 2022-01-04 LAB — CBC WITH DIFFERENTIAL/PLATELET
Abs Immature Granulocytes: 0.09 10*3/uL — ABNORMAL HIGH (ref 0.00–0.07)
Basophils Absolute: 0 10*3/uL (ref 0.0–0.1)
Basophils Relative: 1 %
Eosinophils Absolute: 0 10*3/uL (ref 0.0–0.5)
Eosinophils Relative: 0 %
HCT: 28.6 % — ABNORMAL LOW (ref 36.0–46.0)
Hemoglobin: 8.9 g/dL — ABNORMAL LOW (ref 12.0–15.0)
Immature Granulocytes: 1 %
Lymphocytes Relative: 5 %
Lymphs Abs: 0.4 10*3/uL — ABNORMAL LOW (ref 0.7–4.0)
MCH: 30.1 pg (ref 26.0–34.0)
MCHC: 31.1 g/dL (ref 30.0–36.0)
MCV: 96.6 fL (ref 80.0–100.0)
Monocytes Absolute: 0.5 10*3/uL (ref 0.1–1.0)
Monocytes Relative: 6 %
Neutro Abs: 7.2 10*3/uL (ref 1.7–7.7)
Neutrophils Relative %: 87 %
Platelets: 79 10*3/uL — ABNORMAL LOW (ref 150–400)
RBC: 2.96 MIL/uL — ABNORMAL LOW (ref 3.87–5.11)
RDW: 18.6 % — ABNORMAL HIGH (ref 11.5–15.5)
Smear Review: NORMAL
WBC: 8.3 10*3/uL (ref 4.0–10.5)
nRBC: 0.7 % — ABNORMAL HIGH (ref 0.0–0.2)

## 2022-01-04 LAB — MAGNESIUM: Magnesium: 1.8 mg/dL (ref 1.7–2.4)

## 2022-01-04 LAB — ECHOCARDIOGRAM LIMITED
AR max vel: 2.17 cm2
AV Peak grad: 11.6 mmHg
Ao pk vel: 1.7 m/s
Area-P 1/2: 4.89 cm2
Calc EF: 77.1 %
Height: 64 in
S' Lateral: 2.69 cm
Single Plane A2C EF: 85 %
Single Plane A4C EF: 65.2 %
Weight: 2880 oz

## 2022-01-04 LAB — HEPARIN LEVEL (UNFRACTIONATED)
Heparin Unfractionated: 0.52 IU/mL (ref 0.30–0.70)
Heparin Unfractionated: 0.69 IU/mL (ref 0.30–0.70)
Heparin Unfractionated: 0.71 IU/mL — ABNORMAL HIGH (ref 0.30–0.70)

## 2022-01-04 LAB — LIPASE, BLOOD: Lipase: 124 U/L — ABNORMAL HIGH (ref 11–51)

## 2022-01-04 MED ORDER — HYDRALAZINE HCL 25 MG PO TABS
25.0000 mg | ORAL_TABLET | Freq: Three times a day (TID) | ORAL | Status: DC
  Administered 2022-01-04 – 2022-01-07 (×9): 25 mg via ORAL
  Filled 2022-01-04 (×9): qty 1

## 2022-01-04 NOTE — Consult Note (Signed)
[place] for heparin Indication: DVT  Allergies  Allergen Reactions   Hydrocodone-Acetaminophen Other (See Comments)    Reaction: pt can't take anything with tylenol due to her liver disease.    Ibuprofen Other (See Comments)    Esophageal varices   Naproxen Other (See Comments)    Esophageal varices   Propoxyphene Other (See Comments)    Reaction: pt isn't sure, but knows she can't take it.    Shellfish Allergy Hives   Tamiflu  [Oseltamivir Phosphate] Other (See Comments)    Esophageal varices    Patient Measurements: Height: 5\' 4"  (162.6 cm) Weight: 81.6 kg (180 lb) IBW/kg (Calculated) : 54.7 Heparin Dosing Weight: 72.4 kg  Vital Signs: Temp: 98.7 F (37.1 C) (02/12 0800) Temp Source: Oral (02/12 0800) BP: 156/88 (02/12 0800) Pulse Rate: 83 (02/12 0800)  Labs: Recent Labs    01/02/22 1622 01/02/22 1807 01/03/22 0629 01/04/22 0005 01/04/22 0735  HGB 10.8*  --  8.2* 8.9*  --   HCT 33.8*  --  26.3* 28.6*  --   PLT 171  --  125* 79*  --   APTT  --   --  48*  --   --   LABPROT  --   --  16.3*  --   --   INR  --   --  1.3*  --   --   HEPARINUNFRC  --   --   --  0.71* 0.69  CREATININE 0.56  --  0.61 0.62  --   TROPONINIHS 16 12  --   --   --      Estimated Creatinine Clearance: 72.5 mL/min (by C-G formula based on SCr of 0.62 mg/dL).   Medical History: Past Medical History:  Diagnosis Date   Alcoholic cirrhosis of liver (HCC)    Asthma    CHF (congestive heart failure) ([place])    Chronic disease anemia    Esophageal varices (HCC)    GR I on EGD by Dr Rayann Heman 09/2014   ETOH abuse    Hypertension    Hypothyroidism    Murmur    Portal hypertension ([place])    Sarcoidosis     Medications:  No PTA anticoagulation or antiplatelet therapy  Assessment: 66 year old female with history of CHF, hypertension, hypothyroidism, alcoholic liver cirrhosis with portal hypertension/esophageal varices, variceal hemorrhage that  required banding, ongoing alcohol use. CT liver significant for acute near occlusive thrombus in the portal vein currently extending into the upper SMV and with occlusion of the splenic vein. Pharmacy has been consulted for heparin dosing.   Baseline labs: Hgb 8.2, plts 125, aPTT 48, INR 1.3  Goal of Therapy:  Heparin level 0.3-0.7 units/ml Monitor platelets by anticoagulation protocol: Yes  2/12 0005 HL 0.71, slightly supratherapeutic 2/12 0735 HL 0.69, thera x 1   Plan:  Continue heparin infusion at 1050 units/hr Check confirmatory HL in 6 hr CBC daily while on heparin  Pearla Dubonnet, PharmD Clinical Pharmacist 01/04/2022 8:33 AM

## 2022-01-04 NOTE — Progress Notes (Signed)
Progress Note   Patient: Kathryn [name] LEX:[id number] DOB: [birth year] DOA: 01/02/2022     2 DOS: the patient was seen and examined on 01/04/2022   Brief hospital course: 66 year old female with history of asthma, CHF, hypertension, hypothyroidism, alcoholic liver cirrhosis with portal hypertension/esophageal varices, sarcoidosis presented with chest pain, diaphoresis along with mild left upper quadrant and epigastric pain.  Her last alcoholic drink was 2 days prior to presentation.  On presentation, AST was 98, total bilirubin of 1.3, alkaline phosphatase 233, lipase of 368 along with high-sensitivity troponins of 16 and subsequently 12.  CT of the head showed partial left mastoid effusion with no acute intracranial pathology. Right upper quadrant ultrasound showed query nonocclusive portal vein thrombosis, hepatic steatosis, cholelithiasis with no acute cholecystitis.  She was started on IV fluids.  GI and cardiology were consulted.  CT of the abdomen with contrast showed portal vein thrombosis with possible extension into SMV.  She was started on heparin drip as per GI recommendations.  Assessment and Plan: * Acute pancreatitis- (present on admission) Possibly acute alcoholic pancreatitis -Last alcoholic drink was 2 days prior to presentation.  Lipase 368 on presentation.  Lipase improving. -Patient was counseled regarding abstinence from alcohol by admitting hospitalist -Currently on IV fluids. -Pain management.  GI following.  Diet advancement as per GI recommendations.  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  She was started on heparin drip as per GI recommendations. -GI has also consulted vascular surgery: Follow vascular surgery recommendations as well  Chest pain- (present on admission) - Might be related to pancreatitis but will have to rule out ACS. -Troponins have not trended  upwards. -Cardiology following: Currently being managed conservatively and recommending outpatient stress test  Cirrhosis (HCC)- (present on admission) Possibly alcoholic cirrhosis of liver with ascites with history of portal hypertension and esophageal varices Elevated LFTs Possible nonocclusive portal vein thrombosis -Strict input, daily weights.  Trend LFTs. -GI following.  Hypertension- (present on admission) - Blood pressure on the higher side.  Continue amlodipine, spironolactone and nadolol.  Hypothyroid- (present on admission) - Continue Synthroid  Cholelithiasis without cholecystitis- (present on admission) - will possibly need outpatient general surgery evaluation  Thrombocytopenia (HCC)- (present on admission) - Possibly from alcohol abuse.  Monitor.  Currently no signs of bleeding. -Platelets are 79 today compared to 125 on 01/03/2022.  If platelets continue to drop, might have to discontinue heparin drip and get hematology involved.  Repeat a.m. labs.  Anemia- (present on admission) - Possibly anemia of chronic disease from chronic alcohol use.  Hemoglobin currently stable.  Monitor intermittently.  Physical deconditioning- (present on admission) - PT eval once a little stable medically        Subjective:  Patient seen and examined at bedside.  Still complains of intermittent nausea with abdominal pain but no vomiting since yesterday.  Feels slightly better..  No fever, worsening shortness of breath or chest pain reported.  Physical Exam: Vitals:   01/04/22 0452 01/04/22 0500 01/04/22 0600 01/04/22 0800  BP: (!) 170/82   (!) 156/88  Pulse: 79   83  Resp: 18 10 15 17   Temp:    98.7 F (37.1 C)  TempSrc:    Oral  SpO2: 97%   97%  Weight:      Height:       General: On room air currently.  No distress.  Looks chronically ill and deconditioned. ENT/neck: JVD  is not elevated.  No thyromegaly respiratory: Decreased breath sounds at bases bilaterally with  some crackles  CVS: Currently rate controlled; S1-S2 heard  abdominal: Soft, tenderness present in the epigastric region distended; nondistended, no organomegaly; normal bowel sounds heard Extremities: Lower extremity edema present bilaterally; no clubbing Skin: No obvious ecchymosis/rashes  psych: Currently not agitated.  Affect is flat.   Musculoskeletal: No joint tenderness/deformity  Data Reviewed: I have reviewed today's labs which include sodium of 140, potassium 3.6, magnesium of 1.8, lipase of 124, alkaline phosphatase of 205, AST of 76, total bilirubin of 1.5, hemoglobin of 8.9 and platelets of 79 -CT of the abdomen and pelvis with contrast showed findings as mentioned in assessment and plan    Family Communication: None at bedside  Disposition: Status is: Inpatient Remains inpatient appropriate because: Of need for heparin drip, IV fluids.  Not started on diet yet   Planned Discharge Destination: Home     Time spent: 50 minutes  Author: Glade Lloyd, MD 01/04/2022 8:15 AM  For on call review [email or web address].

## 2022-01-04 NOTE — Consult Note (Signed)
ANTICOAGULATION CONSULT NOTE   Pharmacy Consult for heparin Indication: DVT  Allergies  Allergen Reactions   Hydrocodone-Acetaminophen Other (See Comments)    Reaction: pt can't take anything with tylenol due to her liver disease.    Ibuprofen Other (See Comments)    Esophageal varices   Naproxen Other (See Comments)    Esophageal varices   Propoxyphene Other (See Comments)    Reaction: pt isn't sure, but knows she can't take it.    Shellfish Allergy Hives   Tamiflu  [Oseltamivir Phosphate] Other (See Comments)    Esophageal varices    Patient Measurements: Height: 5\' 4"  (162.6 cm) Weight: 81.6 kg (180 lb) IBW/kg (Calculated) : 54.7 Heparin Dosing Weight: 72.4 kg  Vital Signs: Temp: 98.6 F (37 C) (02/11 2129) Temp Source: Oral (02/11 2129) BP: 179/80 (02/11 2129) Pulse Rate: 78 (02/11 2129)  Labs: Recent Labs    01/02/22 1622 01/02/22 1807 01/03/22 0629 01/04/22 0005  HGB 10.8*  --  8.2* 8.9*  HCT 33.8*  --  26.3* 28.6*  PLT 171  --  125* 79*  APTT  --   --  48*  --   LABPROT  --   --  16.3*  --   INR  --   --  1.3*  --   HEPARINUNFRC  --   --   --  0.71*  CREATININE 0.56  --  0.61 0.62  TROPONINIHS 16 12  --   --      Estimated Creatinine Clearance: 72.5 mL/min (by C-G formula based on SCr of 0.62 mg/dL).   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 CT liver significant for acute  near occlusive thrombus in the portal vein currently extending into the upper SMV and with occlusion of the splenic vein. Pharmacy has been consulted for heparin dosing.   Baseline labs: Hgb 8.2, plts 125, aPTT 48, INR 1.3  Goal of Therapy:  Heparin level 0.3-0.7 units/ml Monitor platelets by anticoagulation protocol: Yes  2/12 0005 HL 0.71, slightly supratherapeutic   Plan:  Decrease heparin infusion rate to 1050 units/hr Recheck HL in 6 hr after rate change. CBC daily while on heparin  4/12, PharmD, [facility] 01/04/2022 1:25 AM

## 2022-01-04 NOTE — Consult Note (Signed)
ANTICOAGULATION CONSULT NOTE   Pharmacy Consult for heparin Indication: DVT  Allergies  Allergen Reactions   Hydrocodone-Acetaminophen Other (See Comments)    Reaction: pt can't take anything with tylenol due to her liver disease.    Ibuprofen Other (See Comments)    Esophageal varices   Naproxen Other (See Comments)    Esophageal varices   Propoxyphene Other (See Comments)    Reaction: pt isn't sure, but knows she can't take it.    Shellfish Allergy Hives   Tamiflu  [Oseltamivir Phosphate] Other (See Comments)    Esophageal varices    Patient Measurements: Height: 5\' 4"  (162.6 cm) Weight: 83 kg (182 lb 15.7 oz) (weight waas 182.9 lbs) IBW/kg (Calculated) : 54.7 Heparin Dosing Weight: 72.4 kg  Vital Signs: Temp: 98.1 F (36.7 C) (02/12 1257) Temp Source: Oral (02/12 0800) BP: 159/69 (02/12 1257) Pulse Rate: 70 (02/12 1257)  Labs: Recent Labs    01/02/22 1622 01/02/22 1807 01/03/22 0629 01/04/22 0005 01/04/22 0735 01/04/22 1324  HGB 10.8*  --  8.2* 8.9*  --   --   HCT 33.8*  --  26.3* 28.6*  --   --   PLT 171  --  125* 79*  --   --   APTT  --   --  48*  --   --   --   LABPROT  --   --  16.3*  --   --   --   INR  --   --  1.3*  --   --   --   HEPARINUNFRC  --   --   --  0.71* 0.69 0.52  CREATININE 0.56  --  0.61 0.62  --   --   TROPONINIHS 16 12  --   --   --   --      Estimated Creatinine Clearance: 73 mL/min (by C-G formula based on SCr of 0.62 mg/dL).   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 CT liver significant for acute near occlusive thrombus in the portal vein currently extending into the upper SMV and with occlusion of the splenic vein. Pharmacy has been consulted for heparin dosing.   Baseline labs: Hgb 8.2, plts 125, aPTT 48, INR 1.3  Goal of Therapy:  Heparin level 0.3-0.7 units/ml Monitor platelets by anticoagulation protocol: Yes  2/12 0005 HL 0.71, slightly supratherapeutic 2/12 0735 HL 0.69, thera x 1   Plan:  2/12 1324 HL 0.52, thera x 2 Continue heparin infusion at 1050 units/hr Will recheck HL with AM labs CBC daily while on heparin  4/12, PharmD Clinical Pharmacist 01/04/2022 2:15 PM

## 2022-01-04 NOTE — Assessment & Plan Note (Addendum)
-  CT of the abdomen with contrast showed portal vein thrombosis with possible extension into SMV.  She was started on heparin drip as per GI recommendations. -Vascular surgery evaluation appreciated: Recommended conservative management with anticoagulation -Continue heparin drip for now and will switch to oral anticoagulation once cleared by GI.

## 2022-01-04 NOTE — Consult Note (Signed)
Reason for Consult: Portal vein thrombosis, SMV thrombosis with slight right colonic edema, peripancreatic stranding, ascites Referring Physician: Merlyn Lot, GI  Kathryn [name] is an 66 y.o. female.  HPI: This is a 66 year old female with long established history of alcoholic cirrhosis, necessitating banding for bleeding esophageal varices last year.  She did not receive any follow-up.  She has not presented with bleeding, but rather nausea vomiting, back pain, chest pain, epigastric pain.  Ultrasound demonstrated thrombosis as noted above, CT scan was subsequently done.  This demonstrated the following: IMPRESSION: 1. 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 2. Signs of suspected marked portal gastropathy and portal colopathy in the setting of near occlusive thrombus in the main portal vein and occlusive thrombus of splenic vein as discussed. Would suggest trending lactate given the above findings of severe thickening of the stomach and moderate thickening of the ascending colon. 3. 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 4. Severe hepatic steatosis and cirrhosis with associated findings of portal hypertension but include esophageal varices and gastric varices. 5. Cholelithiasis. 6. Diffuse edema and worsening of ascites likely related to portal hypertension and portal thrombosis. Some stranding about the pancreas on today's study which may be more pronounced than other areas in the abdomen, consider correlation with pancreatic enzymes to exclude superimposed acute pancreatitis which could be a cause of portal-splenic thrombosis. 7. Evidence of gastric varices arising from short gastrics. 8. Interval development of right colonic wall thickening, potentially related to portal hypertension. 9. Aortic  atherosclerosis.   Aortic Atherosclerosis (ICD10-I70.0).  Lactate was not substantially elevated.  INR and PTT baseline were slightly elevated.  Her pain has resolved overnight with hydration and heparin drip which was initiated upon admission.  I am seeing her in the context of whether something more needs to be done.  There is no history of myeloproliferative disorder, hepatocellular cancer, hypercoagulable state.    Past Medical History:  Diagnosis Date   Alcoholic cirrhosis of liver (HCC)    Asthma    CHF (congestive heart failure) (HCC)    Chronic disease anemia    Esophageal varices (HCC)    GR I on EGD by Dr Shelle Iron 09/2014   ETOH abuse    Hypertension    Hypothyroidism    Murmur    Portal hypertension (HCC)    Sarcoidosis     Past Surgical History:  Procedure Laterality Date   ESOPHAGOGASTRODUODENOSCOPY N/A 06/26/2021   Procedure: ESOPHAGOGASTRODUODENOSCOPY (EGD);  Surgeon: Toledo, Boykin Nearing, MD;  Location: [facility] END[facility]COPY;  Service: Gastroenterology;  Laterality: N/A;   ESOPHAGOGASTRODUODENOSCOPY (EGD) WITH PROPOFOL N/A 04/04/2015   Procedure: ESOPHAGOGASTRODUODENOSCOPY (EGD) WITH PROPOFOL;  Surgeon: Midge Minium, MD;  Location: [facility] ENDOSCOPY;  Service: Endoscopy;  Laterality: N/A;  Dr Servando Snare prefers 1445   ESOPHAGOGASTRODUODENOSCOPY W/ BANDING  09/2014   Rein   TUBAL LIGATION      Family History  Problem Relation Age of Onset   Aneurysm Father    COPD Father    Heart attack Mother     Social History:  reports that she has never smoked. She has never used smokeless tobacco. She reports current alcohol use of about 15.0 standard drinks per week. She reports that she does not use drugs.  Allergies:  Allergies  Allergen Reactions   Hydrocodone-Acetaminophen Other (See Comments)    Reaction: pt can't  take anything with tylenol due to her liver disease.    Ibuprofen Other (See Comments)    Esophageal varices   Naproxen Other (See Comments)    Esophageal varices    Propoxyphene Other (See Comments)    Reaction: pt isn't sure, but knows she can't take it.    Shellfish Allergy Hives   Tamiflu  [Oseltamivir Phosphate] Other (See Comments)    Esophageal varices    Medications: I have reviewed the patient's current medications. Prior to Admission:  Medications Prior to Admission  Medication Sig Dispense Refill Last Dose   amLODipine (NORVASC) 10 MG tablet Take 1 tablet (10 mg total) by mouth daily. 30 tablet 0 Past Week   ferrous sulfate 325 (65 FE) MG tablet Take 325 mg by mouth every other day.   Past Week   folic acid (FOLVITE) 1 MG tablet Take 1 mg by mouth daily.   Past Week   levothyroxine (SYNTHROID) 175 MCG tablet Take 1 tablet (175 mcg total) by mouth daily before breakfast. 30 tablet 0 Past Week   Multiple Vitamin (MULTIVITAMIN) tablet Take 1 tablet by mouth every other day.   Past Week   nadolol (CORGARD) 40 MG tablet Take 40 mg by mouth daily.   Past Week   spironolactone (ALDACTONE) 100 MG tablet Take 100 mg by mouth daily.   Past Week   thiamine 100 MG tablet Take 100 mg by mouth daily.   Past Week   pantoprazole (PROTONIX) 40 MG tablet Take 1 tablet (40 mg total) by mouth 2 (two) times daily. (Patient not taking: Reported on 01/02/2022) 60 tablet 0 Not Taking    Results for orders placed or performed during the hospital encounter of 01/02/22 (from the past 48 hour(s))  Basic metabolic panel     Status: Abnormal   Collection Time: 01/02/22  4:22 PM  Result Value Ref Range   Sodium 142 135 - 145 mmol/L   Potassium 3.9 3.5 - 5.1 mmol/L   Chloride 106 98 - 111 mmol/L   CO2 22 22 - 32 mmol/L   Glucose, Bld 65 (L) 70 - 99 mg/dL    Comment: Glucose reference range applies only to samples taken after fasting for at least 8 hours.   BUN 10 8 - 23 mg/dL   Creatinine, Ser 0.08 0.44 - 1.00 mg/dL   Calcium 8.0 (L) 8.9 - 10.3 mg/dL   GFR, Estimated >67 >61 mL/min    Comment: (NOTE) Calculated using the CKD-EPI Creatinine Equation (2021)     Anion gap 14 5 - 15    Comment: Performed at [facility], [address] [place], [place] [address]  CBC     Status: Abnormal   Collection Time: 01/02/22  4:22 PM  Result Value Ref Range   WBC 7.4 4.0 - 10.5 K/uL   RBC 3.50 (L) 3.87 - 5.11 MIL/uL   Hemoglobin 10.8 (L) 12.0 - 15.0 g/dL   HCT 26.7 (L) 12.4 - 58.0 %   MCV 96.6 80.0 - 100.0 fL   MCH 30.9 26.0 - 34.0 pg   MCHC 32.0 30.0 - 36.0 g/dL   RDW 99.8 (H) 33.8 - 25.0 %   Platelets 171 150 - 400 K/uL   nRBC 0.4 (H) 0.0 - 0.2 %    Comment: Performed at [facility], [address] [place], [place] [address]  Troponin I (High Sensitivity)     Status: None   Collection Time: 01/02/22  4:22 PM  Result Value Ref Range  Troponin I (High Sensitivity) 16 <18 ng/L    Comment: (NOTE) Elevated high sensitivity troponin I (hsTnI) values and significant  changes across serial measurements may suggest ACS but many other  chronic and acute conditions are known to elevate hsTnI results.  Refer to the "Links" section for chest pain algorithms and additional  guidance. Performed at [facility], [address] [place], [place] [address]   Hepatic function panel     Status: Abnormal   Collection Time: 01/02/22  4:30 PM  Result Value Ref Range   Total Protein 7.8 6.5 - 8.1 g/dL   Albumin 2.6 (L) 3.5 - 5.0 g/dL   AST 98 (H) 15 - 41 U/L   ALT 24 0 - 44 U/L   Alkaline Phosphatase 233 (H) 38 - 126 U/L   Total Bilirubin 1.3 (H) 0.3 - 1.2 mg/dL   Bilirubin, Direct 0.5 (H) 0.0 - 0.2 mg/dL   Indirect Bilirubin 0.8 0.3 - 0.9 mg/dL    Comment: Performed at [facility], [address] [place], [place] [address]  Troponin I (High Sensitivity)     Status: None   Collection Time: 01/02/22  6:07 PM  Result Value Ref Range   Troponin I (High Sensitivity) 12 <18 ng/L    Comment: (NOTE) Elevated high sensitivity troponin I (hsTnI) values and significant  changes across serial measurements may suggest ACS but  many other  chronic and acute conditions are known to elevate hsTnI results.  Refer to the "Links" section for chest pain algorithms and additional  guidance. Performed at [facility], [address] [place], [place] [address]   CBG monitoring, ED     Status: None   Collection Time: 01/02/22  6:08 PM  Result Value Ref Range   Glucose-Capillary 72 70 - 99 mg/dL    Comment: Glucose reference range applies only to samples taken after fasting for at least 8 hours.  Lipase, blood     Status: Abnormal   Collection Time: 01/02/22  6:27 PM  Result Value Ref Range   Lipase 368 (H) 11 - 51 U/L    Comment: Performed at [facility], [address] [place], [place] [address]  Resp Panel by RT-PCR (Flu A&B, Covid) Nasopharyngeal Swab     Status: None   Collection Time: 01/02/22  8:22 PM   Specimen: Nasopharyngeal Swab; Nasopharyngeal(NP) swabs in vial transport medium  Result Value Ref Range   SARS Coronavirus 2 by RT PCR NEGATIVE NEGATIVE    Comment: (NOTE) SARS-CoV-2 target nucleic acids are NOT DETECTED.  The SARS-CoV-2 RNA is generally detectable in upper respiratory specimens during the acute phase of infection. The lowest concentration of SARS-CoV-2 viral copies this assay can detect is 138 copies/mL. A negative result does not preclude SARS-Cov-2 infection and should not be used as the sole basis for treatment or other patient management decisions. A negative result may occur with  improper specimen collection/handling, submission of specimen other than nasopharyngeal swab, presence of viral mutation(s) within the areas targeted by this assay, and inadequate number of viral copies(<138 copies/mL). A negative result must be combined with clinical observations, patient history, and epidemiological information. The expected result is Negative.  Fact Sheet for Patients:  BloggerCourse.com  Fact Sheet for Healthcare Providers:   SeriousBroker.it  This test is no t yet approved or cleared by the [place] FDA and  has been authorized for detection and/or diagnosis of SARS-CoV-2 by FDA under an Emergency Use Authorization (EUA). This EUA  will remain  in effect (meaning this test can be used) for the duration of the COVID-19 declaration under Section 564(b)(1) of the Act, 21 U.S.C.section 360bbb-3(b)(1), unless the authorization is terminated  or revoked sooner.       Influenza A by PCR NEGATIVE NEGATIVE   Influenza B by PCR NEGATIVE NEGATIVE    Comment: (NOTE) The Xpert Xpress SARS-CoV-2/FLU/RSV plus assay is intended as an aid in the diagnosis of influenza from Nasopharyngeal swab specimens and should not be used as a sole basis for treatment. Nasal washings and aspirates are unacceptable for Xpert Xpress SARS-CoV-2/FLU/RSV testing.  Fact Sheet for Patients: BloggerCourse.com  Fact Sheet for Healthcare Providers: SeriousBroker.it  This test is not yet approved or cleared by the [place] FDA and has been authorized for detection and/or diagnosis of SARS-CoV-2 by FDA under an Emergency Use Authorization (EUA). This EUA will remain in effect (meaning this test can be used) for the duration of the COVID-19 declaration under Section 564(b)(1) of the Act, 21 U.S.C. section 360bbb-3(b)(1), unless the authorization is terminated or revoked.  Performed at [facility], [address] [place], [place] [address]   Comprehensive metabolic panel     Status: Abnormal   Collection Time: 01/03/22  6:29 AM  Result Value Ref Range   Sodium 142 135 - 145 mmol/L   Potassium 3.7 3.5 - 5.1 mmol/L   Chloride 108 98 - 111 mmol/L   CO2 25 22 - 32 mmol/L   Glucose, Bld 91 70 - 99 mg/dL    Comment: Glucose reference range applies only to samples taken after fasting for at least 8 hours.   BUN 11 8 - 23 mg/dL   Creatinine, Ser  6.04 0.44 - 1.00 mg/dL   Calcium 7.6 (L) 8.9 - 10.3 mg/dL   Total Protein 6.9 6.5 - 8.1 g/dL   Albumin 2.4 (L) 3.5 - 5.0 g/dL   AST 86 (H) 15 - 41 U/L   ALT 22 0 - 44 U/L   Alkaline Phosphatase 224 (H) 38 - 126 U/L   Total Bilirubin 0.9 0.3 - 1.2 mg/dL   GFR, Estimated >54 >09 mL/min    Comment: (NOTE) Calculated using the CKD-EPI Creatinine Equation (2021)    Anion gap 9 5 - 15    Comment: Performed at [facility], [address] [place], [place] [address]  CBC     Status: Abnormal   Collection Time: 01/03/22  6:29 AM  Result Value Ref Range   WBC 7.7 4.0 - 10.5 K/uL   RBC 2.75 (L) 3.87 - 5.11 MIL/uL   Hemoglobin 8.2 (L) 12.0 - 15.0 g/dL   HCT 47.8 (L) 29.5 - 62.1 %   MCV 95.6 80.0 - 100.0 fL   MCH 29.8 26.0 - 34.0 pg   MCHC 31.2 30.0 - 36.0 g/dL   RDW 30.8 (H) 65.7 - 84.6 %   Platelets 125 (L) 150 - 400 K/uL   nRBC 0.3 (H) 0.0 - 0.2 %    Comment: Performed at [facility], [address] [place], [place] [address]  Lipase, blood     Status: Abnormal   Collection Time: 01/03/22  6:29 AM  Result Value Ref Range   Lipase 132 (H) 11 - 51 U/L    Comment: Performed at [facility], [address] [place], [place] [address]  Protime-INR     Status: Abnormal   Collection Time: 01/03/22  6:29 AM  Result Value Ref Range   Prothrombin  Time 16.3 (H) 11.4 - 15.2 seconds   INR 1.3 (H) 0.8 - 1.2    Comment: (NOTE) INR goal varies based on device and disease states. Performed at [facility], [address] [place], [place] [address]   APTT     Status: Abnormal   Collection Time: 01/03/22  6:29 AM  Result Value Ref Range   aPTT 48 (H) 24 - 36 seconds    Comment:        IF BASELINE aPTT IS ELEVATED, SUGGEST PATIENT RISK ASSESSMENT BE USED TO DETERMINE APPROPRIATE ANTICOAGULANT THERAPY. Performed at [facility], [address] [place], [place] [address]   Lactic acid, plasma     Status: None   Collection Time: 01/03/22   5:41 PM  Result Value Ref Range   Lactic Acid, Venous 1.9 0.5 - 1.9 mmol/L    Comment: Performed at [facility], [address] [place], [place] [address]  Lipase, blood     Status: Abnormal   Collection Time: 01/04/22 12:05 AM  Result Value Ref Range   Lipase 124 (H) 11 - 51 U/L    Comment: Performed at [facility], [address] [place], [place] [address]  CBC with Differential/Platelet     Status: Abnormal   Collection Time: 01/04/22 12:05 AM  Result Value Ref Range   WBC 8.3 4.0 - 10.5 K/uL   RBC 2.96 (L) 3.87 - 5.11 MIL/uL   Hemoglobin 8.9 (L) 12.0 - 15.0 g/dL   HCT 65.7 (L) 84.6 - 96.2 %   MCV 96.6 80.0 - 100.0 fL   MCH 30.1 26.0 - 34.0 pg   MCHC 31.1 30.0 - 36.0 g/dL   RDW 95.2 (H) 84.1 - 32.4 %   Platelets 79 (L) 150 - 400 K/uL    Comment: Immature Platelet Fraction may be clinically indicated, consider ordering this additional test [id number]    nRBC 0.7 (H) 0.0 - 0.2 %   Neutrophils Relative % 87 %   Neutro Abs 7.2 1.7 - 7.7 K/uL   Lymphocytes Relative 5 %   Lymphs Abs 0.4 (L) 0.7 - 4.0 K/uL   Monocytes Relative 6 %   Monocytes Absolute 0.5 0.1 - 1.0 K/uL   Eosinophils Relative 0 %   Eosinophils Absolute 0.0 0.0 - 0.5 K/uL   Basophils Relative 1 %   Basophils Absolute 0.0 0.0 - 0.1 K/uL   WBC Morphology MORPHOLOGY UNREMARKABLE    RBC Morphology MORPHOLOGY UNREMARKABLE    Smear Review Normal platelet morphology     Comment: PLATELETS APPEAR DECREASED   Immature Granulocytes 1 %   Abs Immature Granulocytes 0.09 (H) 0.00 - 0.07 K/uL    Comment: Performed at [facility], [address] [place], [place] [address]  Comprehensive metabolic panel     Status: Abnormal   Collection Time: 01/04/22 12:05 AM  Result Value Ref Range   Sodium 140 135 - 145 mmol/L   Potassium 3.6 3.5 - 5.1 mmol/L    Comment: HEMOLYSIS AT THIS LEVEL MAY AFFECT RESULT   Chloride 107 98 - 111 mmol/L   CO2 27 22 - 32 mmol/L   Glucose, Bld 118 (H) 70 - 99  mg/dL    Comment: Glucose reference range applies only to samples taken after fasting for at least 8 hours.   BUN 10 8 - 23 mg/dL   Creatinine, Ser 0.34 0.44 - 1.00 mg/dL   Calcium 7.7 (L) 8.9 - 10.3 mg/dL   Total Protein 7.0 6.5 -  8.1 g/dL   Albumin 2.3 (L) 3.5 - 5.0 g/dL   AST 76 (H) 15 - 41 U/L   ALT 22 0 - 44 U/L   Alkaline Phosphatase 205 (H) 38 - 126 U/L   Total Bilirubin 1.5 (H) 0.3 - 1.2 mg/dL   GFR, Estimated >86 >57 mL/min    Comment: (NOTE) Calculated using the CKD-EPI Creatinine Equation (2021)    Anion gap 6 5 - 15    Comment: Performed at [facility], [address] [place], [place] [address]  Magnesium     Status: None   Collection Time: 01/04/22 12:05 AM  Result Value Ref Range   Magnesium 1.8 1.7 - 2.4 mg/dL    Comment: Performed at [facility], [address] [place], [place] [address]  Heparin level (unfractionated)     Status: Abnormal   Collection Time: 01/04/22 12:05 AM  Result Value Ref Range   Heparin Unfractionated 0.71 (H) 0.30 - 0.70 IU/mL    Comment: (NOTE) The clinical reportable range upper limit is being lowered to >1.10 to align with the FDA approved guidance for the current laboratory assay.  If heparin results are below expected values, and patient dosage has  been confirmed, suggest follow up testing of antithrombin III levels. Performed at [facility], [address] [place], [place] [address]   Heparin level (unfractionated)     Status: None   Collection Time: 01/04/22  7:35 AM  Result Value Ref Range   Heparin Unfractionated 0.69 0.30 - 0.70 IU/mL    Comment: (NOTE) The clinical reportable range upper limit is being lowered to >1.10 to align with the FDA approved guidance for the current laboratory assay.  If heparin results are below expected values, and patient dosage has  been confirmed, suggest follow up testing of antithrombin III levels. Performed at [facility], [address] [place], [place] [address]     DG Chest 2 View  Result Date: 01/02/2022 CLINICAL DATA:  Chest pain. EXAM: CHEST - 2 VIEW COMPARISON:  June 24, 2021. FINDINGS: The heart size and mediastinal contours are within normal limits. Both lungs are clear. The visualized skeletal structures are unremarkable. IMPRESSION: No active cardiopulmonary disease. Electronically Signed   By: Lupita Raider M.D.   On: 01/02/2022 16:49   CT HEAD WO CONTRAST ( )  Result Date: 01/02/2022 CLINICAL DATA:  Neurologic deficit. EXAM: CT HEAD WITHOUT CONTRAST TECHNIQUE: Contiguous axial images were obtained from the base of the skull through the vertex without intravenous contrast. RADIATION DOSE REDUCTION: This exam was performed according to the departmental dose-optimization program which includes automated exposure control, adjustment of the mA and/or kV according to patient size and/or use of iterative reconstruction technique. COMPARISON:  Head CT dated 10/26/2019. FINDINGS: Brain: Mild age-related atrophy and chronic microvascular ischemic changes. There is no acute intracranial hemorrhage. No mass effect or midline shift. No extra-axial fluid collection. Vascular: No hyperdense vessel or unexpected calcification. Skull: Normal. Negative for fracture or focal lesion. Sinuses/Orbits: There is opacification of several left mastoid air cells. The right mastoid air cells and the visualized paranasal sinuses are clear. Other: None IMPRESSION: 1. No acute intracranial pathology. 2. Partial left mastoid effusion. Electronically Signed   By: Elgie Collard M.D.   On: 01/02/2022 19:12   CT LIVER ABDOMEN W WO CONTRAST  Result Date: 01/03/2022 CLINICAL DATA:  66 year old female presents with pain, nausea and vomiting in the setting of liver disease. EXAM: CT ABDOMEN WITHOUT AND WITH  CONTRAST TECHNIQUE: Multidetector CT imaging of the abdomen was performed following the standard protocol before and following the bolus  administration of intravenous contrast. RADIATION DOSE REDUCTION: This exam was performed according to the departmental dose-optimization program which includes automated exposure control, adjustment of the mA and/or kV according to patient size and/or use of iterative reconstruction technique. CONTRAST:  75mL OMNIPAQUE IOHEXOL 350 MG/ML SOLN COMPARISON:  Apr 14, 2020. FINDINGS: Lower chest: Basilar atelectasis. Hepatobiliary: Severe hepatic steatosis and lobular hepatic contours. Liver with similar appearance to prior imaging. Cholelithiasis. Gallbladder without distension and with mild pericholecystic stranding without substantial wall thickening accounting for lack of distension not substantially changed from previous imaging and seen in the setting of ascites and increased edema in the abdomen when compared to previous imaging. No focal, suspicious hepatic lesion. 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 There is however new near occlusive thrombus at the splenic portal confluence extending in the main portal vein. There are some calcifications along the wall the portal vein that suggest prior thrombosis. These calcifications were present on previous imaging. Appearance more compatible with acute thrombus in the portal vein currently extending into the upper SMV and with occlusion of the splenic vein. Hepatic veins are patent. Pancreas: Peripancreatic stranding seen in the context of diffuse edema throughout the body wall, abdominal mesentery and adjacent to abdominal organs and associated with small volume ascites. Spleen: Top-normal size spleen. Adrenals/Urinary Tract: Adrenal glands are normal. Renal cortical scarring. Bilateral renal cysts. No hydronephrosis. Renal vein is patent. Small shunt joins the LEFT renal vein from LEFT retroperitoneal collaterals not a direct route from the splenic vein  which is now occluded. Stomach/Bowel: Marked gastric thickening. The baseline dense material in the stomach may represent ingested medications but does not changed substantially over a series of provided phases. Signs of esophageal varices. Signs of gastric varices arising from short gastrics. No acute small bowel process is visible though there is mild generalized bowel edema involving the small bowel. The SMV remains patent peripherally with engorgement of venous structures. The RIGHT colon is thickened which is also a new finding compared to previous imaging. Vascular/Lymphatic: Portal venous end splenic venous thrombosis with extension into the may uppermost portion of the SMV just below the portal splenic confluence. Calcified atheromatous plaque of the abdominal aorta with normal caliber of the abdominal aorta. No signs of abdominal adenopathy. Other: Small volume ascites about the liver and in the LEFT lower quadrant both areas new since previous imaging. Musculoskeletal: No acute bone finding. No destructive bone process. Spinal degenerative changes. 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 L5 is partially imaged. IMPRESSION: 1. 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 2. Signs of suspected marked portal gastropathy and portal colopathy in the setting of near occlusive thrombus in the main portal vein and occlusive thrombus of splenic vein as discussed. Would suggest trending lactate given the above findings of severe thickening of the stomach and moderate thickening of the ascending colon. 3. 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 4. Severe hepatic steatosis and cirrhosis with associated findings of portal hypertension but include esophageal varices and gastric varices. 5. Cholelithiasis. 6. Diffuse edema and  worsening of ascites likely related to portal hypertension and portal thrombosis.  Some stranding about the pancreas on today's study which may be more pronounced than other areas in the abdomen, consider correlation with pancreatic enzymes to exclude superimposed acute pancreatitis which could be a cause of portal-splenic thrombosis. 7. Evidence of gastric varices arising from short gastrics. 8. Interval development of right colonic wall thickening, potentially related to portal hypertension. 9. Aortic atherosclerosis. Aortic Atherosclerosis (ICD10-I70.0). These results will be called to the ordering clinician or representative by the Radiologist Assistant, and communication documented in the PACS or [employer] Dashboard. Electronically Signed   By: Donzetta KohutGeoffrey  Wile M.D.   On: 01/03/2022 15:02   US Abdomen Limited RUQ (LIVER/GB)  Result Date: 01/02/2022 CLINICAL DATA:  Acute pancreatitis. EXAM: ULTRASOUND ABDOMEN LIMITED RIGHT UPPER QUADRANT COMPARISON:  None. FINDINGS: Gallbladder: Gallbladder stones noted within the gallbladder lumen. No gallbladder wall thickening or pericholecystic fluid visualized. No sonographic Murphy sign noted by sonographer. Common bile duct: Diameter: 4 mm. Liver: No focal lesion identified. Increased parenchymal echogenicity. Partially absent color Doppler flow of the portal vein with otherwise normal direction of blood flow towards the liver. Other: None. IMPRESSION: 1. Query nonocclusive portal vein thrombosis. 2. Hepatic steatosis. Please note limited evaluation for focal hepatic masses in a patient with hepatic steatosis due to decreased penetration of the acoustic ultrasound waves. 3. Cholelithiasis with no acute cholecystitis. Electronically Signed   By: Tish FredericksonMorgane  Naveau M.D.   On: 01/02/2022 22:42    Review of Systems  Gastrointestinal:  Positive for abdominal pain (Resolved).  Genitourinary:  Positive for flank pain (Resolved).  Blood pressure (!) 156/88, pulse 83, temperature  98.7 F (37.1 C), temperature source Oral, resp. rate 17, height 5\' 4"  (1.626 m), weight 81.6 kg, SpO2 97 %. Physical Exam Vitals and nursing note reviewed.  Constitutional:      General: She is not in acute distress.    Appearance: She is well-developed. She is not ill-appearing, toxic-appearing or diaphoretic.  HENT:     Head: Normocephalic.  Eyes:     Comments: No jaundice or scleral icterus  Pulmonary:     Effort: Pulmonary effort is normal.  Abdominal:     Palpations: Abdomen is soft. There is no mass.     Tenderness: There is no abdominal tenderness. There is no rebound.  Musculoskeletal:     Cervical back: Normal range of motion.  Skin:    General: Skin is warm and dry.     Coloration: Skin is not cyanotic or pale.     Findings: No ecchymosis or erythema.     Comments: No abdominal varices  Neurological:     General: No focal deficit present.     Mental Status: She is alert and oriented to person, place, and time.    Assessment/Plan: 1.  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  On top of that, she had a mildly elevated lipase and symptoms suggestive of mild pancreatitis with nausea vomiting and back pain upon presentation.  This certainly could also injure the endothelium of the splenic vein extending into the SMV,, leading to a more prothrombotic state.  Hydration and anticoagulation would be the preferred treatment here, this is not an acute isolated portal event with her history of alcoholic cirrhosis and banding previously, no role for lytic intervention.  Would favor clear liquids today to see how she does, can advance diet thereafter, with consideration potentially for oral anticoagulant,  though DOACs might not be appropriate in this setting given her hepatic synthetic function with elevated INR and PT at  baseline.  Kathryn [name] 01/04/2022, 9:30 AM

## 2022-01-04 NOTE — Progress Notes (Signed)
GI Inpatient Follow-up Note  Subjective:  Patient seen and doing well. Pain has resolved.  Scheduled Inpatient Medications:   amLODipine  10 mg Oral Daily   ferrous sulfate  325 mg Oral QODAY   folic acid  1 mg Oral Daily   levothyroxine  175 mcg Oral Q0600   multivitamin with minerals  1 tablet Oral QODAY   nadolol  40 mg Oral Daily   ondansetron (ZOFRAN) IV  4 mg Intravenous Q6H   pantoprazole (PROTONIX) IV  40 mg Intravenous Q12H   spironolactone  100 mg Oral Daily   thiamine  100 mg Oral Daily    Continuous Inpatient Infusions:    dextrose 5 % and 0.9% NaCl 100 mL/hr at 01/04/22 0802   heparin 1,050 Units/hr (01/04/22 1456)    PRN Inpatient Medications:  acetaminophen **OR** acetaminophen, alum & mag hydroxide-simeth, magnesium hydroxide, metoCLOPramide (REGLAN) injection, morphine injection, traZODone  Review of Systems:  Review of Systems  Constitutional:  Positive for malaise/fatigue. Negative for chills and fever.  Respiratory:  Negative for shortness of breath.   Cardiovascular:  Negative for chest pain.  Gastrointestinal:  Negative for abdominal pain, blood in stool, constipation, diarrhea and vomiting.  Musculoskeletal:  Negative for joint pain.  Skin:  Negative for rash.  Neurological:  Negative for focal weakness.  Psychiatric/Behavioral:  Negative for substance abuse.   All other systems reviewed and are negative.    Physical Examination: BP (!) 159/69 (BP Location: Left Arm)    Pulse 70    Temp 98.1 F (36.7 C)    Resp 18    Ht 5\' 4"  (1.626 m)    Wt 83 kg Comment: weight waas 182.9 lbs   SpO2 99%    BMI 31.41 kg/m  Gen: NAD, alert and oriented x 4 HEENT: PEERLA, EOMI, Neck: supple, no JVD or thyromegaly Chest: No respiratory distress Abd: soft, non-tender, non-distended Ext: no edema, well perfused with 2+ pulses, Skin: no rash or lesions noted Lymph: no lymphadenopathy  Data: Lab Results  Component Value Date   WBC 8.3 01/04/2022   HGB 8.9 (L)  01/04/2022   HCT 28.6 (L) 01/04/2022   MCV 96.6 01/04/2022   PLT 79 (L) 01/04/2022   Recent Labs  Lab 01/02/22 1622 01/03/22 0629 01/04/22 0005  HGB 10.8* 8.2* 8.9*   Lab Results  Component Value Date   NA 140 01/04/2022   K 3.6 01/04/2022   CL 107 01/04/2022   CO2 27 01/04/2022   BUN 10 01/04/2022   CREATININE 0.62 01/04/2022   Lab Results  Component Value Date   ALT 22 01/04/2022   AST 76 (H) 01/04/2022   ALKPHOS 205 (H) 01/04/2022   BILITOT 1.5 (H) 01/04/2022   Recent Labs  Lab 01/03/22 0629  APTT 48*  INR 1.3*   Assessment/Plan: Ms. Godette is a 66 y.o. lady with alcohol cirrhosis who presented with pancreatitis from alcohol and subsequently found to have acute portal vein clot with extension into SMV with some chronic clot as well. Currently doing well on heparin drip. Very difficult situation as if acute clot not adequately treated, this could predispose her to worsening esophageal/gastric varices in the future but she is at increased risk for bleeding given history of cirrhosis and low platelets  Recommendations:  - continue heparin drip for now, if platelets drop any further then will likely need to discontinue - advance diet as tolerated - gentle IV hydration - continue beta-blocker - monitor cbc's and for any overt GI bleeding -  will need to consider outpatient NOAC for short course given SMV clot - again advised patient that she needs complete alcohol cessation - also discussed risks/benefits of anticoagulation with patient  Will continue to follow, please call with any questions or concerns.  Merlyn Lot MD, MPH [facility]

## 2022-01-04 NOTE — Progress Notes (Signed)
Patient's RN questioned about infiltration on Rt. arm  PIV access. It looks swelling with possible hematoma. However, it has good blood returned. Watching the site at this time. Informed Zoia RN regarding this matter and if it is getting bigger, put in the consult for new PIV access. HS [employer]

## 2022-01-04 NOTE — Plan of Care (Signed)
°  Problem: Education: °Goal: Knowledge of General Education information will improve °Description: Including pain rating scale, medication(s)/side effects and non-pharmacologic comfort measures °Outcome: Progressing °  °Problem: Health Behavior/Discharge Planning: °Goal: Ability to manage health-related needs will improve °Outcome: Progressing °  °Problem: Clinical Measurements: °Goal: Diagnostic test results will improve °Outcome: Progressing °Goal: Respiratory complications will improve °Outcome: Progressing °  °Problem: Activity: °Goal: Risk for activity intolerance will decrease °Outcome: Progressing °  °Problem: Nutrition: °Goal: Adequate nutrition will be maintained °Outcome: Progressing °  °Problem: Elimination: °Goal: Will not experience complications related to bowel motility °Outcome: Progressing °Goal: Will not experience complications related to urinary retention °Outcome: Progressing °  °Problem: Pain Managment: °Goal: General experience of comfort will improve °Outcome: Progressing °  °Problem: Safety: °Goal: Ability to remain free from injury will improve °Outcome: Progressing °  °Problem: Education: °Goal: Knowledge of Pancreatitis treatment and prevention will improve °Outcome: Progressing °  °Problem: Health Behavior/Discharge Planning: °Goal: Ability to formulate a plan to maintain an alcohol-free life will improve °Outcome: Progressing °  °Problem: Nutritional: °Goal: Ability to achieve adequate nutritional intake will improve °Outcome: Progressing °  °Problem: Clinical Measurements: °Goal: Complications related to the disease process, condition or treatment will be avoided or minimized °Outcome: Progressing °  °

## 2022-01-04 NOTE — Progress Notes (Signed)
Progress Note  Patient Name: Kathryn [name] Date of Encounter: 01/04/2022  [facility] Cardiologist: None   Subjective   Feeling better.  No chest pain.   Inpatient Medications    Scheduled Meds:  amLODipine  10 mg Oral Daily   ferrous sulfate  325 mg Oral QODAY   folic acid  1 mg Oral Daily   levothyroxine  175 mcg Oral Q0600   multivitamin with minerals  1 tablet Oral QODAY   nadolol  40 mg Oral Daily   ondansetron (ZOFRAN) IV  4 mg Intravenous Q6H   pantoprazole (PROTONIX) IV  40 mg Intravenous Q12H   spironolactone  100 mg Oral Daily   thiamine  100 mg Oral Daily   Continuous Infusions:  dextrose 5 % and 0.9% NaCl 100 mL/hr at 01/04/22 1601   heparin 1,050 Units/hr (01/04/22 1456)   PRN Meds: acetaminophen **OR** acetaminophen, alum & mag hydroxide-simeth, magnesium hydroxide, metoCLOPramide (REGLAN) injection, morphine injection, traZODone   Vital Signs    Vitals:   01/04/22 0600 01/04/22 0800 01/04/22 1058 01/04/22 1257  BP:  (!) 156/88  (!) 159/69  Pulse:  83  70  Resp: 15 17  18   Temp:  98.7 F (37.1 C)  98.1 F (36.7 C)  TempSrc:  Oral    SpO2:  97%  99%  Weight:   83 kg   Height:        Intake/Output Summary (Last 24 hours) at 01/04/2022 1641 Last data filed at 01/04/2022 1456 Gross per 24 hour  Intake 2409.7 ml  Output 1675 ml  Net 734.7 ml   Last 3 Weights 01/04/2022 01/02/2022 01/02/2022  Weight (lbs) 182 lb 15.7 oz 182 lb 9.6 oz 180 lb  Weight (kg) 83 kg 82.827 kg 81.647 kg      Telemetry    Sinus rhythm.  Personally Reviewed  ECG    N/a  - Personally Reviewed  Physical Exam   GEN: No acute distress.   Neck: No JVD Cardiac: RRR, no murmurs, rubs, or gallops.  Respiratory: Clear to auscultation bilaterally. GI: Soft, nontender, non-distended  MS: No edema; No deformity. Neuro:  Nonfocal  Psych: Normal affect   Labs    High Sensitivity Troponin:   Recent Labs  Lab 01/02/22 1622 01/02/22 1807  TROPONINIHS 16 12      Chemistry Recent Labs  Lab 01/02/22 1622 01/02/22 1630 01/03/22 0629 01/04/22 0005  NA 142  --  142 140  K 3.9  --  3.7 3.6  CL 106  --  108 107  CO2 22  --  25 27  GLUCOSE 65*  --  91 118*  BUN 10  --  11 10  CREATININE 0.56  --  0.61 0.62  CALCIUM 8.0*  --  7.6* 7.7*  MG  --   --   --  1.8  PROT  --  7.8 6.9 7.0  ALBUMIN  --  2.6* 2.4* 2.3*  AST  --  98* 86* 76*  ALT  --  24 22 22   ALKPHOS  --  233* 224* 205*  BILITOT  --  1.3* 0.9 1.5*  GFRNONAA >60  --  >60 >60  ANIONGAP 14  --  9 6    Lipids No results for input(s): CHOL, TRIG, HDL, LABVLDL, LDLCALC, CHOLHDL in the last 168 hours.  Hematology Recent Labs  Lab 01/02/22 1622 01/03/22 0629 01/04/22 0005  WBC 7.4 7.7 8.3  RBC 3.50* 2.75* 2.96*  HGB 10.8* 8.2* 8.9*  HCT 33.8* 26.3* 28.6*  MCV 96.6 95.6 96.6  MCH 30.9 29.8 30.1  MCHC 32.0 31.2 31.1  RDW 18.6* 18.7* 18.6*  PLT 171 125* 79*   Thyroid No results for input(s): TSH, FREET4 in the last 168 hours.  BNPNo results for input(s): BNP, PROBNP in the last 168 hours.  DDimer No results for input(s): DDIMER in the last 168 hours.   Radiology    DG Chest 2 View  Result Date: 01/02/2022 CLINICAL DATA:  Chest pain. EXAM: CHEST - 2 VIEW COMPARISON:  June 24, 2021. FINDINGS: The heart size and mediastinal contours are within normal limits. Both lungs are clear. The visualized skeletal structures are unremarkable. IMPRESSION: No active cardiopulmonary disease. Electronically Signed   By: Marijo Conception M.D.   On: 01/02/2022 16:49   CT HEAD WO CONTRAST (5MM)  Result Date: 01/02/2022 CLINICAL DATA:  Neurologic deficit. EXAM: CT HEAD WITHOUT CONTRAST TECHNIQUE: Contiguous axial images were obtained from the base of the skull through the vertex without intravenous contrast. RADIATION DOSE REDUCTION: This exam was performed according to the departmental dose-optimization program which includes automated exposure control, adjustment of the mA and/or kV according to  patient size and/or use of iterative reconstruction technique. COMPARISON:  Head CT dated 10/26/2019. FINDINGS: Brain: Mild age-related atrophy and chronic microvascular ischemic changes. There is no acute intracranial hemorrhage. No mass effect or midline shift. No extra-axial fluid collection. Vascular: No hyperdense vessel or unexpected calcification. Skull: Normal. Negative for fracture or focal lesion. Sinuses/Orbits: There is opacification of several left mastoid air cells. The right mastoid air cells and the visualized paranasal sinuses are clear. Other: None IMPRESSION: 1. No acute intracranial pathology. 2. Partial left mastoid effusion. Electronically Signed   By: Anner Crete M.D.   On: 01/02/2022 19:12   CT LIVER ABDOMEN W WO CONTRAST  Result Date: 01/03/2022 CLINICAL DATA:  66 year old female presents with pain, nausea and vomiting in the setting of liver disease. EXAM: CT ABDOMEN WITHOUT AND WITH CONTRAST TECHNIQUE: Multidetector CT imaging of the abdomen was performed following the standard protocol before and following the bolus administration of intravenous contrast. RADIATION DOSE REDUCTION: This exam was performed according to the departmental dose-optimization program which includes automated exposure control, adjustment of the mA and/or kV according to patient size and/or use of iterative reconstruction technique. CONTRAST:  38mL OMNIPAQUE IOHEXOL 350 MG/ML SOLN COMPARISON:  Apr 14, 2020. FINDINGS: Lower chest: Basilar atelectasis. Hepatobiliary: Severe hepatic steatosis and lobular hepatic contours. Liver with similar appearance to prior imaging. Cholelithiasis. Gallbladder without distension and with mild pericholecystic stranding without substantial wall thickening accounting for lack of distension not substantially changed from previous imaging and seen in the setting of ascites and increased edema in the abdomen when compared to previous imaging. No focal, suspicious hepatic lesion.  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 There is however new near occlusive thrombus at the splenic portal confluence extending in the main portal vein. There are some calcifications along the wall the portal vein that suggest prior thrombosis. These calcifications were present on previous imaging. Appearance more compatible with acute thrombus in the portal vein currently extending into the upper SMV and with occlusion of the splenic vein. Hepatic veins are patent. Pancreas: Peripancreatic stranding seen in the context of diffuse edema throughout the body wall, abdominal mesentery and adjacent to abdominal organs and associated with small volume ascites. Spleen: Top-normal size spleen.  Adrenals/Urinary Tract: Adrenal glands are normal. Renal cortical scarring. Bilateral renal cysts. No hydronephrosis. Renal vein is patent. Small shunt joins the LEFT renal vein from LEFT retroperitoneal collaterals not a direct route from the splenic vein which is now occluded. Stomach/Bowel: Marked gastric thickening. The baseline dense material in the stomach may represent ingested medications but does not changed substantially over a series of provided phases. Signs of esophageal varices. Signs of gastric varices arising from short gastrics. No acute small bowel process is visible though there is mild generalized bowel edema involving the small bowel. The SMV remains patent peripherally with engorgement of venous structures. The RIGHT colon is thickened which is also a new finding compared to previous imaging. Vascular/Lymphatic: Portal venous end splenic venous thrombosis with extension into the may uppermost portion of the SMV just below the portal splenic confluence. Calcified atheromatous plaque of the abdominal aorta with normal caliber of the abdominal aorta. No signs of abdominal adenopathy. Other: Small volume  ascites about the liver and in the LEFT lower quadrant both areas new since previous imaging. Musculoskeletal: No acute bone finding. No destructive bone process. Spinal degenerative changes. 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 L5 is partially imaged. IMPRESSION: 1. 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 2. Signs of suspected marked portal gastropathy and portal colopathy in the setting of near occlusive thrombus in the main portal vein and occlusive thrombus of splenic vein as discussed. Would suggest trending lactate given the above findings of severe thickening of the stomach and moderate thickening of the ascending colon. 3. 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 4. Severe hepatic steatosis and cirrhosis with associated findings of portal hypertension but include esophageal varices and gastric varices. 5. Cholelithiasis. 6. Diffuse edema and worsening of ascites likely related to portal hypertension and portal thrombosis. Some stranding about the pancreas on today's study which may be more pronounced than other areas in the abdomen, consider correlation with pancreatic enzymes to exclude superimposed acute pancreatitis which could be a cause of portal-splenic thrombosis. 7. Evidence of gastric varices arising from short gastrics. 8. Interval development of right colonic wall thickening, potentially related to portal hypertension. 9. Aortic atherosclerosis. Aortic Atherosclerosis (ICD10-I70.0). These results will be called to the ordering clinician or representative by the Radiologist Assistant, and communication documented in the PACS or [employer] Dashboard. Electronically Signed   By: Donzetta KohutGeoffrey  Wile M.D.   On: 01/03/2022 15:02   ECHOCARDIOGRAM LIMITED  Result Date: 01/04/2022    ECHOCARDIOGRAM LIMITED REPORT    Patient Name:   Kathryn [name] Date of Exam: 01/04/2022 Medical Rec #:  [id number]    Height:       64.0 in Accession #:    [id number]   Weight:       180.0 lb Date of Birth:  04/15/1956    BSA:          1.871 m Patient Age:    65 years     BP:           170/82 mmHg Patient Gender: F            HR:           79 bpm. Exam Location:  [facility] Procedure: Limited Echo, Color Doppler and Cardiac Doppler Indications:     Chest Pain  History:  Patient has prior history of Echocardiogram examinations. CHF,                  Asthma; Signs/Symptoms:Murmur.  Sonographer:     L Thornton-Maynard Referring Phys:  [id number] [facility] [place] Diagnosing Phys: Skeet Latch MD IMPRESSIONS  1. Mild intracavitary gradient with no significant change with Valsalva. Peak velocity 1.2 m/s. Peak gradient 5 mmHg. Left ventricular ejection fraction, by estimation, is 65 to 70%. The left ventricle has normal function. There is moderate asymmetric left ventricular hypertrophy of the basal-septal segment. Left ventricular diastolic parameters were normal.  2. Trivial mitral valve regurgitation.  3. The aortic valve is tricuspid. Aortic valve regurgitation is mild.  4. There is Moderate (Grade III) atheroma plaque involving the ascending aorta.  5. There is normal pulmonary artery systolic pressure. FINDINGS  Left Ventricle: Mild intracavitary gradient with no significant change with Valsalva. Peak velocity 1.2 m/s. Peak gradient 5 mmHg. Left ventricular ejection fraction, by estimation, is 65 to 70%. The left ventricle has normal function. There is moderate  asymmetric left ventricular hypertrophy of the basal-septal segment. Left ventricular diastolic parameters were normal. Indeterminate filling pressures. Right Ventricle: There is normal pulmonary artery systolic pressure. The tricuspid regurgitant velocity is 2.51 m/s, and with an assumed right atrial pressure of 3 mmHg, the estimated right ventricular systolic pressure is [id number] mmHg. Mitral  Valve: Trivial mitral valve regurgitation. Tricuspid Valve: Tricuspid valve regurgitation is trivial. Aortic Valve: The aortic valve is tricuspid. Aortic valve regurgitation is mild. Aortic valve peak gradient measures 11.6 mmHg. Aorta: There is moderate (Grade III) atheroma plaque involving the ascending aorta. LEFT VENTRICLE PLAX 2D LVIDd:         3.95 cm     Diastology LVIDs:         2.69 cm     LV e' medial:    8.38 cm/s LV PW:         1.07 cm     LV E/e' medial:  14.0 LV IVS:        1.59 cm     LV e' lateral:   10.60 cm/s LVOT diam:     1.90 cm     LV E/e' lateral: 11.0 LV SV:         83 LV SV Index:   44 LVOT Area:     2.84 cm  LV Volumes (MOD) LV vol d, MOD A2C: 46.2 ml LV vol d, MOD A4C: 32.5 ml LV vol s, MOD A2C: 6.9 ml LV vol s, MOD A4C: 11.3 ml LV SV MOD A2C:     39.3 ml LV SV MOD A4C:     32.5 ml LV SV MOD BP:      30.0 ml LEFT ATRIUM         Index LA diam:    4.10 cm 2.19 cm/m  AORTIC VALVE AV Area (Vmax): 2.17 cm AV Vmax:        170.00 cm/s AV Peak Grad:   11.6 mmHg LVOT Vmax:      130.00 cm/s LVOT Vmean:     95.000 cm/s LVOT VTI:       0.293 m  AORTA Ao Asc diam: 2.90 cm MITRAL VALVE                TRICUSPID VALVE MV Area (PHT): 4.89 cm     TR Peak grad:   25.2 mmHg MV Decel Time: 155 msec     TR Vmax:  251.00 cm/s MV E velocity: 117.00 cm/s MV A velocity: 81.40 cm/s   SHUNTS MV E/A ratio:  1.44         Systemic VTI:  0.29 m                             Systemic Diam: 1.90 cm Skeet Latch MD Electronically signed by Skeet Latch MD Signature Date/Time: 01/04/2022/2:42:59 PM    Final    US Abdomen Limited RUQ (LIVER/GB)  Result Date: 01/02/2022 CLINICAL DATA:  Acute pancreatitis. EXAM: ULTRASOUND ABDOMEN LIMITED RIGHT UPPER QUADRANT COMPARISON:  None. FINDINGS: Gallbladder: Gallbladder stones noted within the gallbladder lumen. No gallbladder wall thickening or pericholecystic fluid visualized. No sonographic Murphy sign noted by sonographer. Common bile duct: Diameter: 4 mm. Liver:  No focal lesion identified. Increased parenchymal echogenicity. Partially absent color Doppler flow of the portal vein with otherwise normal direction of blood flow towards the liver. Other: None. IMPRESSION: 1. Query nonocclusive portal vein thrombosis. 2. Hepatic steatosis. Please note limited evaluation for focal hepatic masses in a patient with hepatic steatosis due to decreased penetration of the acoustic ultrasound waves. 3. Cholelithiasis with no acute cholecystitis. Electronically Signed   By: Iven Finn M.D.   On: 01/02/2022 22:42    Cardiac Studies   Echo 01/03/22:  1. Mild intracavitary gradient with no significant change with Valsalva.  Peak velocity 1.2 m/s. Peak gradient 5 mmHg. Left ventricular ejection  fraction, by estimation, is 65 to 70%. The left ventricle has normal  function. There is moderate asymmetric  left ventricular hypertrophy of the basal-septal segment. Left ventricular  diastolic parameters were normal.   2. Trivial mitral valve regurgitation.   3. The aortic valve is tricuspid. Aortic valve regurgitation is mild.   4. There is Moderate (Grade III) atheroma plaque involving the ascending  aorta.   5. There is normal pulmonary artery systolic pressure.   Patient Profile     Kathryn [name] is a 66 y.o. female with a hx of asthma, hypertension, aortic atherosclerosis, hypothyroidism, alcoholism, cirrhosis, portal hypertension, esophageal varices, and sarcoidosis evaluated by cardiology for chest pain   Assessment & Plan    #Chest pain: # PV thrombosis: Ms. Ledvina presented with chest pain.  Ultimately she has been found to have pancreatitis and acute portal vein thrombosis.  Her symptoms are very atypical and more likely related to her portal vein thrombosis, recurrent emesis and pancreatitis than ischemia.  Normal systolic function on echo.  Currently being anticoagulated for PV thrombosis with heparin.  She is currently anemic with a hemoglobin of 8.2 and has  known esophageal varices and portal hypertension.  Also with worsening thrombocytopenia.   She would not be a very good cardiac catheterization candidate at this time.  Cardiac enzymes are unremarkable.  No further cardiac testing.   # Aortic atherosclerosis: #Hyperlipidemia: Noted on prior abdominal imaging.  LDL was 111 on 06/2021.  Her LDL should be less than 70.  Given her cirrhosis and pancreatitis would not recommend a statin at this time.  Can be reevaluated as an outpatient.   #Hypertension: Blood pressure remains poorly controlled on her home amlodipine, nadolol, and spironolactone .  WIll add hydralazine.   [facility] will sign off.   Medication Recommendations:  add hydralazine Other recommendations (labs, testing, etc):  none Follow up as an outpatient:  prn  For questions or updates, please contact [facility] Please consult [email or web address] for contact info under  Signed, Skeet Latch, MD  01/04/2022, 4:41 PM

## 2022-01-04 NOTE — Assessment & Plan Note (Addendum)
PT eval.

## 2022-01-05 ENCOUNTER — Inpatient Hospital Stay: Payer: Medicare Other | Admitting: Radiology

## 2022-01-05 DIAGNOSIS — E876 Hypokalemia: Secondary | ICD-10-CM

## 2022-01-05 LAB — COMPREHENSIVE METABOLIC PANEL
ALT: 24 U/L (ref 0–44)
AST: 126 U/L — ABNORMAL HIGH (ref 15–41)
Albumin: 2.1 g/dL — ABNORMAL LOW (ref 3.5–5.0)
Alkaline Phosphatase: 197 U/L — ABNORMAL HIGH (ref 38–126)
Anion gap: 9 (ref 5–15)
BUN: 7 mg/dL — ABNORMAL LOW (ref 8–23)
CO2: 23 mmol/L (ref 22–32)
Calcium: 7.7 mg/dL — ABNORMAL LOW (ref 8.9–10.3)
Chloride: 107 mmol/L (ref 98–111)
Creatinine, Ser: 0.58 mg/dL (ref 0.44–1.00)
GFR, Estimated: >60 mL/min (ref >60)
Glucose, Bld: 102 mg/dL — ABNORMAL HIGH (ref 70–99)
Potassium: 3.3 mmol/L — ABNORMAL LOW (ref 3.5–5.1)
Sodium: 139 mmol/L (ref 135–145)
Total Bilirubin: 1.5 mg/dL — ABNORMAL HIGH (ref 0.3–1.2)
Total Protein: 6.4 g/dL — ABNORMAL LOW (ref 6.5–8.1)

## 2022-01-05 LAB — CBC WITH DIFFERENTIAL/PLATELET
Abs Immature Granulocytes: 0.15 10*3/uL — ABNORMAL HIGH (ref 0.00–0.07)
Basophils Absolute: 0.1 10*3/uL (ref 0.0–0.1)
Basophils Relative: 1 %
Eosinophils Absolute: 0.1 10*3/uL (ref 0.0–0.5)
Eosinophils Relative: 1 %
HCT: 26.5 % — ABNORMAL LOW (ref 36.0–46.0)
Hemoglobin: 8.6 g/dL — ABNORMAL LOW (ref 12.0–15.0)
Immature Granulocytes: 1 %
Lymphocytes Relative: 4 %
Lymphs Abs: 0.5 10*3/uL — ABNORMAL LOW (ref 0.7–4.0)
MCH: 30.5 pg (ref 26.0–34.0)
MCHC: 32.5 g/dL (ref 30.0–36.0)
MCV: 94 fL (ref 80.0–100.0)
Monocytes Absolute: 0.6 10*3/uL (ref 0.1–1.0)
Monocytes Relative: 6 %
Neutro Abs: 9.4 10*3/uL — ABNORMAL HIGH (ref 1.7–7.7)
Neutrophils Relative %: 87 %
Platelets: 113 10*3/uL — ABNORMAL LOW (ref 150–400)
RBC: 2.82 MIL/uL — ABNORMAL LOW (ref 3.87–5.11)
RDW: 18.7 % — ABNORMAL HIGH (ref 11.5–15.5)
WBC: 10.8 10*3/uL — ABNORMAL HIGH (ref 4.0–10.5)
nRBC: 0.6 % — ABNORMAL HIGH (ref 0.0–0.2)

## 2022-01-05 LAB — AFP TUMOR MARKER: AFP, Serum, Tumor Marker: 10.8 ng/mL — ABNORMAL HIGH (ref 0.0–9.2)

## 2022-01-05 LAB — LIPASE, BLOOD: Lipase: 126 U/L — ABNORMAL HIGH (ref 11–51)

## 2022-01-05 LAB — HEPARIN LEVEL (UNFRACTIONATED): Heparin Unfractionated: 0.39 IU/mL (ref 0.30–0.70)

## 2022-01-05 LAB — MAGNESIUM: Magnesium: 1.6 mg/dL — ABNORMAL LOW (ref 1.7–2.4)

## 2022-01-05 MED ORDER — POTASSIUM CHLORIDE CRYS ER 20 MEQ PO TBCR
40.0000 meq | EXTENDED_RELEASE_TABLET | Freq: Once | ORAL | Status: AC
  Administered 2022-01-05: 40 meq via ORAL
  Filled 2022-01-05: qty 2

## 2022-01-05 MED ORDER — MAGNESIUM SULFATE 2 GM/50ML IV SOLN
2.0000 g | Freq: Once | INTRAVENOUS | Status: AC
Start: 2022-01-05 — End: 2022-01-05
  Administered 2022-01-05: 2 g via INTRAVENOUS
  Filled 2022-01-05: qty 50

## 2022-01-05 MED ORDER — PANTOPRAZOLE SODIUM 20 MG PO TBEC
20.0000 mg | DELAYED_RELEASE_TABLET | Freq: Every day | ORAL | Status: DC
  Administered 2022-01-06 – 2022-01-07 (×2): 20 mg via ORAL
  Filled 2022-01-05 (×3): qty 1

## 2022-01-05 MED ORDER — POLYETHYLENE GLYCOL 3350 17 G PO PACK
17.0000 g | PACK | Freq: Every day | ORAL | Status: DC
  Administered 2022-01-05 – 2022-01-07 (×3): 17 g via ORAL
  Filled 2022-01-05 (×3): qty 1

## 2022-01-05 NOTE — Progress Notes (Signed)
GI Inpatient Follow-up Note  Subjective:  Patient seen and doing well. No pain. Small bowel movement this morning.  Scheduled Inpatient Medications:   amLODipine  10 mg Oral Daily   ferrous sulfate  325 mg Oral QODAY   folic acid  1 mg Oral Daily   hydrALAZINE  25 mg Oral Q8H   levothyroxine  175 mcg Oral Q0600   multivitamin with minerals  1 tablet Oral QODAY   nadolol  40 mg Oral Daily   ondansetron (ZOFRAN) IV  4 mg Intravenous Q6H   pantoprazole (PROTONIX) IV  40 mg Intravenous Q12H   spironolactone  100 mg Oral Daily   thiamine  100 mg Oral Daily    Continuous Inpatient Infusions:    heparin 1,050 Units/hr (01/05/22 1453)    PRN Inpatient Medications:  acetaminophen **OR** acetaminophen, alum & mag hydroxide-simeth, magnesium hydroxide, metoCLOPramide (REGLAN) injection, morphine injection, traZODone  Review of Systems:  Review of Systems  Constitutional:  Negative for chills and fever.  Respiratory:  Negative for shortness of breath.   Cardiovascular:  Negative for chest pain.  Gastrointestinal:  Negative for abdominal pain, blood in stool and melena.  Musculoskeletal:  Positive for joint pain.  Skin:  Negative for itching and rash.     Physical Examination: BP 115/66 (BP Location: Right Arm)    Pulse 66    Temp 98.3 F (36.8 C)    Resp 18    Ht 5\' 4"  (1.626 m)    Wt 82.9 kg    SpO2 97%    BMI 31.38 kg/m  Gen: NAD, alert and oriented x 4 HEENT: PEERLA, EOMI, Neck: supple, no JVD or thyromegaly Chest: No respiratory distress CV: RRR Abd: soft, slightly distended but no fluid wave Ext: no edema, well perfused with 2+ pulses, Skin: no rash or lesions noted Lymph: no LAD  Data: Lab Results  Component Value Date   WBC 10.8 (H) 01/05/2022   HGB 8.6 (L) 01/05/2022   HCT 26.5 (L) 01/05/2022   MCV 94.0 01/05/2022   PLT 113 (L) 01/05/2022   Recent Labs  Lab 01/03/22 0629 01/04/22 0005 01/05/22 0524  HGB 8.2* 8.9* 8.6*   Lab Results  Component Value  Date   NA 139 01/05/2022   K 3.3 (L) 01/05/2022   CL 107 01/05/2022   CO2 23 01/05/2022   BUN 7 (L) 01/05/2022   CREATININE 0.58 01/05/2022   Lab Results  Component Value Date   ALT 24 01/05/2022   AST 126 (H) 01/05/2022   ALKPHOS 197 (H) 01/05/2022   BILITOT 1.5 (H) 01/05/2022   Recent Labs  Lab 01/03/22 0629  APTT 48*  INR 1.3*   Assessment/Plan: Ms. Lebeck is a 66 y.o. lady with alcohol cirrhosis who presented with pancreatitis from alcohol and subsequently found to have acute portal vein clot with extension into SMV with some chronic clot as well. Currently doing well on heparin drip. Very difficult situation as if acute clot not adequately treated, this could predispose her to worsening esophageal/gastric varices in the future but she is at increased risk for bleeding given history of cirrhosis and low platelets  Recommendations:  - continue heparin drip for now - continue beta-blocker - bowel regimen - monitor cbc's and for any overt GI bleeding - will recommend NOAC at discharge, will have short term follow-up in the outpatient setting. Ideally would continue NOAC for 2-3 months and then stop. - PT/OT - dispo planning   Will continue to follow, please call with  any questions or concerns.   Raylene Miyamoto MD, MPH [place]

## 2022-01-05 NOTE — Progress Notes (Signed)
Pt was brought to [place] for paracentesis. Upon US examination, there were small fluid pockets noted to L and R lower abdomen that was not large enough to safely access.  Exam was ended and explained to patient.  Patient was in agreement with findings.      Alex Gardener, AGNP-BC 01/05/2022, 2:02 PM

## 2022-01-05 NOTE — Progress Notes (Signed)
Progress Note   Patient: Kathryn [name] [id number] DOB: 09-25-1956 DOA: 01/02/2022     3 DOS: the patient was seen and examined on 01/05/2022   Brief hospital course: 66 year old female with history of asthma, CHF, hypertension, hypothyroidism, alcoholic liver cirrhosis with portal hypertension/esophageal varices, sarcoidosis presented with chest pain, diaphoresis along with mild left upper quadrant and epigastric pain.  Her last alcoholic drink was 2 days prior to presentation.  On presentation, AST was 98, total bilirubin of 1.3, alkaline phosphatase 233, lipase of 368 along with high-sensitivity troponins of 16 and subsequently 12.  CT of the head showed partial left mastoid effusion with no acute intracranial pathology. Right upper quadrant ultrasound showed query nonocclusive portal vein thrombosis, hepatic steatosis, cholelithiasis with no acute cholecystitis.  She was started on IV fluids.  GI and cardiology were consulted.  CT of the abdomen with contrast showed portal vein thrombosis with possible extension into SMV.  She was started on heparin drip as per GI recommendations.  Vascular surgery was consulted.  Assessment and Plan: * Acute pancreatitis- (present on admission) Possibly acute alcoholic pancreatitis -Last alcoholic drink was 2 days prior to presentation.  Lipase 368 on presentation.  Lipase improving. -Patient was counseled regarding abstinence from alcohol by admitting hospitalist -Currently on IV fluids. -Pain management.  GI following.  Diet advancement as per GI recommendations.  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  She was started on heparin drip as per GI recommendations. -Vascular surgery evaluation appreciated: Recommended conservative management with anticoagulation  Chest pain- (present on admission) - Might be related to pancreatitis and portal vein  thrombosis -Troponins have not trended upwards. -Cardiology signed off on 01/04/2022: Currently being managed conservatively and recommending outpatient stress test.  Normal systolic function on echo  Cirrhosis (HCC)- (present on admission) Possibly alcoholic cirrhosis of liver with ascites with history of portal hypertension and esophageal varices Elevated LFTs Possible nonocclusive portal vein thrombosis -Strict input, daily weights.  Trend LFTs. -GI following.  Hypertension- (present on admission) - Blood pressure on the higher side.  Continue amlodipine, hydralazine, spironolactone and nadolol.  Hypothyroid- (present on admission) - Continue Synthroid  Cholelithiasis without cholecystitis- (present on admission) - will possibly need outpatient general surgery evaluation  Thrombocytopenia (HCC)- (present on admission) - Possibly from alcohol abuse.  Monitor.  Currently no signs of bleeding. -Platelets are improving to 113 today compared to 79 on 01/04/2022. Repeat a.m. labs.  Anemia- (present on admission) - Possibly anemia of chronic disease from chronic alcohol use.  Hemoglobin currently stable.  Monitor intermittently.  Hypomagnesemia - Replace.  Repeat a.m. labs  Hypokalemia - Replace.  Repeat a.m. labs  Physical deconditioning- (present on admission) - PT eval once a little stable medically        Subjective:  Patient seen and examined at bedside.  Feels better but still complains of intermittent abdominal pain with nausea.  Denies worsening shortness of breath, chest pain, fever. Physical Exam: Vitals:   01/05/22 0454 01/05/22 0456 01/05/22 0600 01/05/22 0729  BP:    115/66  Pulse:    66  Resp: 17 20 19 18   Temp: 98.8 F (37.1 C)   98.3 F (36.8 C)  TempSrc: Oral     SpO2:    97%  Weight:  82.9 kg    Height:       General: No acute distress.  Currently on room air.  Looks chronically ill  and deconditioned. ENT/neck: No palpable thyromegaly.  No  elevated JVD  respiratory: Bilateral decreased breath sounds at bases with some crackles scattered  CVS: S1-S2 heard; rate controlled abdominal: Soft; mildly tender in the epigastric region; distended moderately; no organomegaly; bowel sounds are heard  extremities: Bilateral lower extremity edema present; no cyanosis or clubbing  Skin: No obvious petechiae/lesions psych: Flat affect.  No signs of agitation currently. Musculoskeletal: No joint swelling/tenderness or deformity  Data Reviewed: I have reviewed today's labs myself.  Potassium is 3.3, magnesium of 1.6, lipase of 126, alkaline phosphatase 197, AST of 126, total bili of 1.5, hemoglobin of 8.6, platelets of 113   Family Communication: None at bedside Disposition: Status is: Inpatient Remains inpatient appropriate because: Of severity of illness    Planned Discharge Destination: Home     Time spent: 50 minutes  Author: Glade Lloyd, MD 01/05/2022 7:58 AM  For on call review [email or web address].

## 2022-01-05 NOTE — Assessment & Plan Note (Addendum)
Improved

## 2022-01-05 NOTE — Consult Note (Signed)
ANTICOAGULATION CONSULT NOTE   Pharmacy Consult for heparin Indication: DVT  Allergies  Allergen Reactions   Hydrocodone-Acetaminophen Other (See Comments)    Reaction: pt can't take anything with tylenol due to her liver disease.    Ibuprofen Other (See Comments)    Esophageal varices   Naproxen Other (See Comments)    Esophageal varices   Propoxyphene Other (See Comments)    Reaction: pt isn't sure, but knows she can't take it.    Shellfish Allergy Hives   Tamiflu  [Oseltamivir Phosphate] Other (See Comments)    Esophageal varices    Patient Measurements: Height: 5\' 4"  (162.6 cm) Weight: 82.9 kg (182 lb 12.8 oz) IBW/kg (Calculated) : 54.7 Heparin Dosing Weight: 72.4 kg  Vital Signs: Temp: 98.8 F (37.1 C) (02/13 0454) Temp Source: Oral (02/13 0454) BP: 145/67 (02/13 0453) Pulse Rate: 67 (02/13 0453)  Labs: Recent Labs    01/02/22 1622 01/02/22 1807 01/03/22 0629 01/04/22 0005 01/04/22 0005 01/04/22 0735 01/04/22 1324 01/05/22 0524  HGB 10.8*  --  8.2* 8.9*  --   --   --   --   HCT 33.8*  --  26.3* 28.6*  --   --   --   --   PLT 171  --  125* 79*  --   --   --   --   APTT  --   --  48*  --   --   --   --   --   LABPROT  --   --  16.3*  --   --   --   --   --   INR  --   --  1.3*  --   --   --   --   --   HEPARINUNFRC  --   --   --  0.71*   < > 0.69 0.52 0.39  CREATININE 0.56  --  0.61 0.62  --   --   --  0.58  TROPONINIHS 16 12  --   --   --   --   --   --    < > = values in this interval not displayed.     Estimated Creatinine Clearance: 73 mL/min (by C-G formula based on SCr of 0.58 mg/dL).   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 CT liver significant for acute near occlusive thrombus in the portal vein currently extending into the upper SMV and with occlusion of the splenic vein. Pharmacy has been consulted for heparin dosing.   Baseline labs: Hgb 8.2, plts 125, aPTT 48, INR 1.3  Goal of Therapy:  Heparin level 0.3-0.7 units/ml Monitor platelets by anticoagulation protocol: Yes  2/12 0005 HL 0.71, slightly supratherapeutic 2/12 0735 HL 0.69, thera x 1 2/12 1324 HL 0.52, thera x 2 2/13 0524 HL 0.39, thera x 3   Plan:  Continue  heparin infusion at 1050 units/hr Will recheck HL with AM labs CBC daily while on heparin  [name], PharmD, [facility] 01/05/2022 6:02 AM

## 2022-01-06 LAB — HEPARIN LEVEL (UNFRACTIONATED): Heparin Unfractionated: 0.33 IU/mL (ref 0.30–0.70)

## 2022-01-06 LAB — COMPREHENSIVE METABOLIC PANEL WITH GFR
ALT: 24 U/L (ref 0–44)
AST: 112 U/L — ABNORMAL HIGH (ref 15–41)
Albumin: 2.1 g/dL — ABNORMAL LOW (ref 3.5–5.0)
Alkaline Phosphatase: 209 U/L — ABNORMAL HIGH (ref 38–126)
Anion gap: 3 — ABNORMAL LOW (ref 5–15)
BUN: 8 mg/dL (ref 8–23)
CO2: 23 mmol/L (ref 22–32)
Calcium: 7.9 mg/dL — ABNORMAL LOW (ref 8.9–10.3)
Chloride: 109 mmol/L (ref 98–111)
Creatinine, Ser: 0.68 mg/dL (ref 0.44–1.00)
GFR, Estimated: >60 mL/min
Glucose, Bld: 78 mg/dL (ref 70–99)
Potassium: 4 mmol/L (ref 3.5–5.1)
Sodium: 135 mmol/L (ref 135–145)
Total Bilirubin: 1.8 mg/dL — ABNORMAL HIGH (ref 0.3–1.2)
Total Protein: 6.7 g/dL (ref 6.5–8.1)

## 2022-01-06 LAB — CBC
HCT: 29 % — ABNORMAL LOW (ref 36.0–46.0)
Hemoglobin: 9.1 g/dL — ABNORMAL LOW (ref 12.0–15.0)
MCH: 30.8 pg (ref 26.0–34.0)
MCHC: 31.4 g/dL (ref 30.0–36.0)
MCV: 98.3 fL (ref 80.0–100.0)
Platelets: 141 10*3/uL — ABNORMAL LOW (ref 150–400)
RBC: 2.95 MIL/uL — ABNORMAL LOW (ref 3.87–5.11)
RDW: 19.2 % — ABNORMAL HIGH (ref 11.5–15.5)
WBC: 11.3 10*3/uL — ABNORMAL HIGH (ref 4.0–10.5)
nRBC: 0.6 % — ABNORMAL HIGH (ref 0.0–0.2)

## 2022-01-06 LAB — MAGNESIUM: Magnesium: 2 mg/dL (ref 1.7–2.4)

## 2022-01-06 MED ORDER — APIXABAN 5 MG PO TABS: 5.0000 mg | ORAL_TABLET | Freq: Two times a day (BID) | ORAL | Status: DC

## 2022-01-06 MED ORDER — APIXABAN 5 MG PO TABS
10.0000 mg | ORAL_TABLET | Freq: Two times a day (BID) | ORAL | Status: DC
  Administered 2022-01-07: 10 mg via ORAL
  Filled 2022-01-06: qty 2

## 2022-01-06 NOTE — Consult Note (Signed)
ANTICOAGULATION CONSULT NOTE   Pharmacy Consult for Eliquis Indication: DVT  Allergies  Allergen Reactions   Hydrocodone-Acetaminophen Other (See Comments)    Reaction: pt can't take anything with tylenol due to her liver disease.    Ibuprofen Other (See Comments)    Esophageal varices   Naproxen Other (See Comments)    Esophageal varices   Propoxyphene Other (See Comments)    Reaction: pt isn't sure, but knows she can't take it.    Shellfish Allergy Hives   Tamiflu  [Oseltamivir Phosphate] Other (See Comments)    Esophageal varices    Patient Measurements: Height: 5\' 4"  (162.6 cm) Weight: 83.7 kg (184 lb 9.4 oz) IBW/kg (Calculated) : 54.7 Heparin Dosing Weight: 72.4 kg  Vital Signs: Temp: 98 F (36.7 C) (02/14 1303) BP: 137/73 (02/14 1303) Pulse Rate: 64 (02/14 1303)  Labs: Recent Labs    01/04/22 0005 01/04/22 0735 01/04/22 1324 01/05/22 0524 01/06/22 0701 01/06/22 0728  HGB 8.9*  --   --  8.6*  --  9.1*  HCT 28.6*  --   --  26.5*  --  29.0*  PLT 79*  --   --  113*  --  141*  HEPARINUNFRC 0.71*   < > 0.52 0.39 0.33  --   CREATININE 0.62  --   --  0.58  --  0.68   < > = values in this interval not displayed.     Estimated Creatinine Clearance: 73.4 mL/min (by C-G formula based on SCr of 0.68 mg/dL).   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 CT liver significant for acute near  occlusive thrombus in the portal vein currently extending into the upper SMV and with occlusion of the splenic vein. Pharmacy has been consulted for Eliquis to start 2/15. Pt currently on heparin infusion.    Baseline labs: Hgb 8.2, plts 125, aPTT 48, INR 1.3  Goal of Therapy:  Heparin level 0.3-0.7 units/ml Monitor platelets by anticoagulation protocol: Yes  2/12 0005 HL 0.71, slightly supratherapeutic 2/12 0735 HL 0.69, thera x 1 2/12 1324 HL 0.52, thera x 2 2/13 0524 HL 0.39, thera x 3 2/14 0701 HL 0.33, thera (daily)   Plan:  Continue heparin infusion at 1050 units/hr until 2/15 @1000  then switch to Apixaban 10 mg BID for 7 days followed by 5 mg BID   3/15, PharmD Clinical Pharmacist  01/06/2022 5:29 PM

## 2022-01-06 NOTE — Evaluation (Signed)
Physical Therapy Evaluation Patient Details Name: Kathryn [name] MRN: [id number] DOB: 1955-12-05 Today's Date: 01/06/2022  History of Present Illness  Patient is a 66 year old female who reports to presented to [facility] ED with  onset of midsternal chest pain felt as pressure as if somebody sitting on her chest with associated diaphoresis and radiation to her left arm with left arm numbness as well as her neck. Patient was admitted for acute pancreatitis. PMH(+) for  asthma, CHF, hypertension hypothyroidism, alcoholic liver cirrhosis, portal hypertension and esophageal varices as well as sarcoidosis   Clinical Impression  Physical Therapy Evaluation completed on this date. Patient tolerated session well and was agreeable to treatment. No pain reported throughout session. Per patient she lives with her sister in a 1 story home with 5 STE and bilateral hand rails. Patient was not ambulating with an AD prior to hospitalization, and was independent with all ADLs and mobility.   Patient demonstrated increased time and effort in order to complete bed mobility, sit to stands, and ambulation throughout session. BLE strength demonstrated generalized weakness, with at least 3/5 strength. Bed mobility (supine<>sitting) was completed supervision, and required assistance from UE support and bed rails to complete. Patient scooped unilateral leg up onto bed in order to complete sit to supine. Sit to stands progressed from CGA-Supervision with RW. Patient demonstrates increased unsteadiness on feet when ambulating without an AD. She was able to ambulate 160 feet with RW at SBA and no major unsteadiness or LOB. Patient ambulated an additional 20 feet within the room at [facility] with no AD. Patient is demonstrating impaired functional ability compared to her baseline, and would continue to benefit from skilled physical therapy in order to optimize patient's return to PLOF. Recommend HHPT upon discharge from acute hospitalization.       Recommendations for follow up therapy are one component of a multi-disciplinary discharge planning process, led by the attending physician.  Recommendations may be updated based on patient status, additional functional criteria and insurance authorization.  Follow Up Recommendations Home health PT    Assistance Recommended at Discharge Frequent or constant Supervision/Assistance  Patient can return home with the following  A little help with walking and/or transfers;A little help with bathing/dressing/bathroom;Assist for transportation;Help with stairs or ramp for entrance    Equipment Recommendations Rolling walker (2 wheels)  Recommendations for Other Services       Functional Status Assessment Patient has had a recent decline in their functional status and demonstrates the ability to make significant improvements in function in a reasonable and predictable amount of time.     Precautions / Restrictions Precautions Precautions: Fall Restrictions Weight Bearing Restrictions: No      Mobility  Bed Mobility Overal bed mobility: Needs Assistance Bed Mobility: Supine to Sit, Sit to Supine     Supine to sit: Supervision Sit to supine: Supervision   General bed mobility comments: increased use of bed rails for pull up into sitting, increased time and effort, to complete sit to supine patient requires scooping leg closest to be up onto bed to complete task    Transfers Overall transfer level: Needs assistance   Transfers: Sit to/from Stand Sit to Stand: Supervision, Min guard           General transfer comment: x2 sit to stand transfers, initial completed at [facility] however second completed at [facility]    Ambulation/Gait Ambulation/Gait assistance: Supervision, Min guard ((SBA)) Gait Distance (Feet): 180 Feet (x160 feet with RW, x20 feet within room  w/o AD) Assistive device: Rolling walker (2 wheels), None Gait Pattern/deviations: Step-through pattern, Decreased step length  - right, Decreased step length - left, Trunk flexed, Narrow base of support Gait velocity: decreased     General Gait Details: ambulating with RW patient was able to complete SBA with no LOB, w/o AD within room patient completed CGA with increased unsteadiness noted however no LOB  Stairs            Wheelchair Mobility    Modified Rankin (Stroke Patients Only)       Balance Overall balance assessment: Needs assistance Sitting-balance support: Bilateral upper extremity supported, Feet supported Sitting balance-Leahy Scale: Good     Standing balance support: Bilateral upper extremity supported, No upper extremity supported, During functional activity, Reliant on assistive device for balance Standing balance-Leahy Scale: Fair Standing balance comment: increased unsteadiness noted without AD (requires CGA), reliant on AD for increased stability during ambulation                             Pertinent Vitals/Pain Pain Assessment Pain Assessment: No/denies pain    Home Living Family/patient expects to be discharged to:: Private residence Living Arrangements: Other relatives (sister who is 66 years old) Available Help at Discharge: Family;Available PRN/intermittently (sister works, other sister lives close by and is retired (able to help when needed)) Type of Home: House Home Access: Stairs to enter Entrance Stairs-Rails: Can reach Optician, dispensing of Steps: [place]: One level        Prior Function Prior Level of Function : Independent/Modified Independent             Mobility Comments: no AD at baseline       [place]: Right    Extremity/Trunk Assessment   Upper Extremity Assessment Upper Extremity Assessment: Overall WFL for tasks assessed    Lower Extremity Assessment Lower Extremity Assessment: Generalized weakness (at least 3/5 stregnth bilaterally)       Communication    Communication: No difficulties  Cognition Arousal/Alertness: Awake/alert Behavior During Therapy: WFL for tasks assessed/performed Overall Cognitive Status: Within Functional Limits for tasks assessed                                 General Comments: A&Ox3 self situation and location        General Comments General comments (skin integrity, edema, etc.): SpO2 98% on room air, HR ranged from 68-72bpm    Exercises Other Exercises Other Exercises: patient educated on role of PT, fall risk, d/c plans   Assessment/Plan    PT Assessment Patient needs continued PT services  PT Problem List Decreased strength;Decreased mobility;Decreased range of motion;Decreased activity tolerance;Decreased balance       PT Treatment Interventions DME instruction;Therapeutic activities;Gait training;Therapeutic exercise;Patient/family education;Stair training;Balance training;Functional mobility training    PT Goals (Current goals can be found in the Care Plan section)  Acute Rehab PT Goals Patient Stated Goal: to get better PT Goal Formulation: With patient Time For Goal Achievement: 01/20/22 Potential to Achieve Goals: Good    Frequency Min 2X/week     Co-evaluation               AM-PAC PT "6 Clicks" Mobility  Outcome Measure Help needed turning from your back to your side while in a flat bed without using bedrails?: A Little Help needed moving  from lying on your back to sitting on the side of a flat bed without using bedrails?: A Little Help needed moving to and from a bed to a chair (including a wheelchair)?: A Little Help needed standing up from a chair using your arms (e.g., wheelchair or bedside chair)?: A Little Help needed to walk in hospital room?: A Little Help needed climbing 3-5 steps with a railing? : A Little 6 Click Score: 18    End of Session Equipment Utilized During Treatment: Gait belt Activity Tolerance: Patient tolerated treatment well;No increased  pain Patient left: in bed;with call bell/phone within reach Nurse Communication: Mobility status PT Visit Diagnosis: Unsteadiness on feet (R26.81);Muscle weakness (generalized) (M62.81);Difficulty in walking, not elsewhere classified (R26.2)    Time: [id number] PT Time Calculation (min) (ACUTE ONLY): 19 min   Charges:   PT Evaluation $PT Eval Low Complexity: 1 Low          Iva Boop, PT  01/06/22. 1:53 PM

## 2022-01-06 NOTE — Plan of Care (Signed)
°  Problem: Education: Goal: Knowledge of General Education information will improve Description: Including pain rating scale, medication(s)/side effects and non-pharmacologic comfort measures Outcome: Progressing   Problem: Health Behavior/Discharge Planning: Goal: Ability to manage health-related needs will improve Outcome: Progressing   Problem: Clinical Measurements: Goal: Diagnostic test results will improve Outcome: Progressing Goal: Respiratory complications will improve Outcome: Progressing   Problem: Activity: Goal: Risk for activity intolerance will decrease Outcome: Progressing   Problem: Nutrition: Goal: Adequate nutrition will be maintained Outcome: Progressing   Problem: Elimination: Goal: Will not experience complications related to bowel motility Outcome: Progressing Goal: Will not experience complications related to urinary retention Outcome: Progressing   Problem: Pain Managment: Goal: General experience of comfort will improve Outcome: Progressing   Problem: Safety: Goal: Ability to remain free from injury will improve Outcome: Progressing   Problem: Education: Goal: Knowledge of Pancreatitis treatment and prevention will improve Outcome: Progressing   Problem: Health Behavior/Discharge Planning: Goal: Ability to formulate a plan to maintain an alcohol-free life will improve Outcome: Progressing   Problem: Nutritional: Goal: Ability to achieve adequate nutritional intake will improve Outcome: Progressing   Problem: Clinical Measurements: Goal: Complications related to the disease process, condition or treatment will be avoided or minimized Outcome: Progressing

## 2022-01-06 NOTE — Consult Note (Signed)
ANTICOAGULATION CONSULT NOTE   Pharmacy Consult for heparin Indication: DVT  Allergies  Allergen Reactions   Hydrocodone-Acetaminophen Other (See Comments)    Reaction: pt can't take anything with tylenol due to her liver disease.    Ibuprofen Other (See Comments)    Esophageal varices   Naproxen Other (See Comments)    Esophageal varices   Propoxyphene Other (See Comments)    Reaction: pt isn't sure, but knows she can't take it.    Shellfish Allergy Hives   Tamiflu  [Oseltamivir Phosphate] Other (See Comments)    Esophageal varices    Patient Measurements: Height: 5\' 4"  (162.6 cm) Weight: 83.7 kg (184 lb 9.4 oz) IBW/kg (Calculated) : 54.7 Heparin Dosing Weight: 72.4 kg  Vital Signs: Temp: 99.1 F (37.3 C) (02/14 0502) Temp Source: Oral (02/13 2014) BP: 124/66 (02/14 0502) Pulse Rate: 64 (02/14 0502)  Labs: Recent Labs    01/04/22 0005 01/04/22 0735 01/04/22 1324 01/05/22 0524 01/06/22 0701  HGB 8.9*  --   --  8.6*  --   HCT 28.6*  --   --  26.5*  --   PLT 79*  --   --  113*  --   HEPARINUNFRC 0.71*   < > 0.52 0.39 0.33  CREATININE 0.62  --   --  0.58  --    < > = values in this interval not displayed.     Estimated Creatinine Clearance: 73.4 mL/min (by C-G formula based on SCr of 0.58 mg/dL).   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 CT liver significant for acute near occlusive  thrombus in the portal vein currently extending into the upper SMV and with occlusion of the splenic vein. Pharmacy has been consulted for heparin dosing.   Baseline labs: Hgb 8.2, plts 125, aPTT 48, INR 1.3  Goal of Therapy:  Heparin level 0.3-0.7 units/ml Monitor platelets by anticoagulation protocol: Yes  2/12 0005 HL 0.71, slightly supratherapeutic 2/12 0735 HL 0.69, thera x 1 2/12 1324 HL 0.52, thera x 2 2/13 0524 HL 0.39, thera x 3 2/14 0701 HL 0.33, thera (daily)   Plan: Continue heparin infusion at 1050 units/hr Therapeutic x 4. CBC stable, with improved Hgb and plts. Daily HL, CBC    3/14, PharmD Pharmacy Resident  01/06/2022 7:55 AM

## 2022-01-06 NOTE — Progress Notes (Signed)
GI Inpatient Follow-up Note  Subjective:  Patient seen and doing well. No acute issues. Tolerated diet. No bleeding.  Scheduled Inpatient Medications:   amLODipine  10 mg Oral Daily   ferrous sulfate  325 mg Oral QODAY   folic acid  1 mg Oral Daily   hydrALAZINE  25 mg Oral Q8H   levothyroxine  175 mcg Oral Q0600   multivitamin with minerals  1 tablet Oral QODAY   nadolol  40 mg Oral Daily   ondansetron (ZOFRAN) IV  4 mg Intravenous Q6H   pantoprazole  20 mg Oral Daily   polyethylene glycol  17 g Oral Daily   spironolactone  100 mg Oral Daily   thiamine  100 mg Oral Daily    Continuous Inpatient Infusions:    heparin 1,050 Units/hr (01/05/22 1453)    PRN Inpatient Medications:  acetaminophen **OR** acetaminophen, alum & mag hydroxide-simeth, magnesium hydroxide, metoCLOPramide (REGLAN) injection, morphine injection, traZODone  Review of Systems:  Review of Systems  Constitutional:  Negative for chills and fever.  HENT:  Negative for hearing loss.   Respiratory:  Negative for sputum production and shortness of breath.   Cardiovascular:  Negative for chest pain.  Gastrointestinal:  Negative for abdominal pain, constipation, diarrhea, nausea and vomiting.  Musculoskeletal:  Negative for joint pain.  Skin:  Negative for itching and rash.  Neurological:  Negative for focal weakness.  Psychiatric/Behavioral:  Negative for substance abuse.   All other systems reviewed and are negative.    Physical Examination: BP 137/73 (BP Location: Left Leg)    Pulse 64    Temp 98 F (36.7 C)    Resp 18    Ht 5\' 4"  (1.626 m)    Wt 83.7 kg    SpO2 98%    BMI 31.68 kg/m  Gen: NAD, alert and oriented x 4 HEENT: PEERLA, EOMI, Neck: supple Chest: No respiratory distress Abd: soft, non-tender, non-distended Ext: no edema, well perfused with 2+ pulses, Skin: no rash or lesions noted Lymph: no LAD  Data: Lab Results  Component Value Date   WBC 11.3 (H) 01/06/2022   HGB 9.1 (L)  01/06/2022   HCT 29.0 (L) 01/06/2022   MCV 98.3 01/06/2022   PLT 141 (L) 01/06/2022   Recent Labs  Lab 01/04/22 0005 01/05/22 0524 01/06/22 0728  HGB 8.9* 8.6* 9.1*   Lab Results  Component Value Date   NA 135 01/06/2022   K 4.0 01/06/2022   CL 109 01/06/2022   CO2 23 01/06/2022   BUN 8 01/06/2022   CREATININE 0.68 01/06/2022   Lab Results  Component Value Date   ALT 24 01/06/2022   AST 112 (H) 01/06/2022   ALKPHOS 209 (H) 01/06/2022   BILITOT 1.8 (H) 01/06/2022   Recent Labs  Lab 01/03/22 0629  APTT 48*  INR 1.3*   Assessment/Plan: Ms. Whisman is a 66 y.o. lady with alcohol cirrhosis who presented with pancreatitis from alcohol and subsequently found to have acute portal vein clot with extension into SMV with some chronic clot as well. Currently doing well on heparin drip. Very difficult situation as if acute clot not adequately treated, this could predispose her to worsening esophageal/gastric varices in the future but she is at increased risk for bleeding given history of cirrhosis and low platelets  Recommendations:  - continue heparin drip for now, recommend switch to NOAC at discharge - continue beta-blocker - bowel regimen - PT/OT - dispo planning   Will arrange for short interval follow-up at  discharge.   Merlyn Lot MD, MPH [facility]

## 2022-01-06 NOTE — Care Management Important Message (Signed)
Important Message  Patient Details  Name: Kathryn [name] MRN: [id number] Date of Birth: 09-Oct-1956   Medicare Important Message Given:  N/A - LOS <3 / Initial given by admissions     Johnell Comings 01/06/2022, 11:31 AM

## 2022-01-06 NOTE — Progress Notes (Addendum)
Progress Note   Patient: Kathryn [name] DOB: [birth year] DOA: 01/02/2022     4 DOS: the patient was seen and examined on 01/06/2022   Brief hospital course: 66 year old female with history of asthma, CHF, hypertension, hypothyroidism, alcoholic liver cirrhosis with portal hypertension/esophageal varices, sarcoidosis presented with chest pain, diaphoresis along with mild left upper quadrant and epigastric pain.  Her last alcoholic drink was 2 days prior to presentation.  On presentation, AST was 98, total bilirubin of 1.3, alkaline phosphatase 233, lipase of 368 along with high-sensitivity troponins of 16 and subsequently 12.  CT of the head showed partial left mastoid effusion with no acute intracranial pathology. Right upper quadrant ultrasound showed query nonocclusive portal vein thrombosis, hepatic steatosis, cholelithiasis with no acute cholecystitis.  She was started on IV fluids.  GI and cardiology were consulted.  CT of the abdomen with contrast showed portal vein thrombosis with possible extension into SMV.  She was started on heparin drip as per GI recommendations.  Vascular surgery recommended conservative treatment with anticoagulation.  Assessment and Plan: * Acute pancreatitis- (present on admission) Possibly acute alcoholic pancreatitis -Last alcoholic drink was 2 days prior to presentation.  Lipase 368 on presentation.  Lipase improving. -Patient was counseled regarding abstinence from alcohol by admitting hospitalist -Pain improving.  GI following.  DC'd IV fluids on 01/05/2022.  She had some vomiting on 01/05/2022: Improved since then.  We will monitor her symptoms with advanced diet today.  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  She was started on heparin drip as per GI recommendations. -Vascular surgery evaluation appreciated: Recommended conservative management with  anticoagulation -Continue heparin drip for now and will switch to oral anticoagulation once cleared by GI.  Chest pain- (present on admission) - Might be related to pancreatitis and portal vein thrombosis -Troponins have not trended upwards. -Cardiology signed off on 01/04/2022: Currently being managed conservatively and recommending outpatient stress test.  Normal systolic function on echo -Currently no chest pain.  Cirrhosis (HCC)- (present on admission) Possibly alcoholic cirrhosis of liver with ascites with history of portal hypertension and esophageal varices Elevated LFTs Possible nonocclusive portal vein thrombosis -Strict input, daily weights.  Trend LFTs. -GI following. -There was no significant ascites for ultrasound-guided paracentesis on 01/05/2022  Hypertension- (present on admission) - Blood pressure currently stable.  Continue amlodipine, hydralazine, spironolactone and nadolol.  Hypothyroid- (present on admission) - Continue Synthroid  Cholelithiasis without cholecystitis- (present on admission) - will possibly need outpatient general surgery evaluation  Thrombocytopenia (HCC)- (present on admission) - Possibly from alcohol abuse.  Monitor.  Currently no signs of bleeding. -Platelets are improving to 141 today compared to 79 on 01/04/2022. Repeat a.m. labs.  Anemia- (present on admission) - Possibly anemia of chronic disease from chronic alcohol use.  Hemoglobin currently stable.  Monitor intermittently.  Hypomagnesemia - Improved  Hypokalemia - Improved  Physical deconditioning- (present on admission) - PT eval  Obesity -outpatient follow-up        Subjective:  Patient seen and examined at bedside.  Had some nausea and vomiting yesterday.  Feels better this morning although has not had breakfast yet.  Denies worsening abdominal pain, fever, chest pain.  Physical Exam: Vitals:   01/05/22 2014 01/06/22 0027 01/06/22 0502 01/06/22 0756  BP: 127/69  (!) 109/55 124/66 133/73  Pulse: 65 67 64 64  Resp: 16 17 16 18   Temp: 98.7 F (37.1 C) 98.9 F (  37.2 C) 99.1 F (37.3 C) 98.3 F (36.8 C)  TempSrc: Oral     SpO2: 97% 100% 97% 97%  Weight:  83.7 kg    Height:       General: On room air.  No distress.  Looks chronically ill and deconditioned. ENT/neck: JVD is not elevated.  No thyromegaly. respiratory: Decreased breath sounds at bases bilaterally, no wheezing  CVS: Currently rate controlled; S1-S2 heard abdominal: Soft; currently nontender; still distended; no organomegaly; normal bowel sounds heard extremities: No clubbing; mild lower extremity edema present  skin: No obvious ecchymosis/rashes  psych: Currently not agitated.  Affect is flat. Musculoskeletal: No obvious joint deformity or tenderness  Data Reviewed: I have reviewed patient's investigations myself.  Today's sodium is 135, potassium 4, creatinine of 0.68, total bili of 1.8, AST of 112, alkaline phosphatase of 209 with a WBC of 11.3 and hemoglobin of 9.1, platelets of 141.   Family Communication: None at bedside  Disposition: Status is: Inpatient Remains inpatient appropriate because: Of severity of illness.  Still on heparin drip.  Will monitor symptoms with advanced diet today.     Planned Discharge Destination: Home     Time spent: 50 minutes  Author: Glade Lloyd, MD 01/06/2022 10:20 AM  For on call review [email or web address].

## 2022-01-07 LAB — COMPREHENSIVE METABOLIC PANEL
ALT: 26 U/L (ref 0–44)
AST: 99 U/L — ABNORMAL HIGH (ref 15–41)
Albumin: 2.3 g/dL — ABNORMAL LOW (ref 3.5–5.0)
Alkaline Phosphatase: 217 U/L — ABNORMAL HIGH (ref 38–126)
Anion gap: 3 — ABNORMAL LOW (ref 5–15)
BUN: 8 mg/dL (ref 8–23)
CO2: 24 mmol/L (ref 22–32)
Calcium: 8.6 mg/dL — ABNORMAL LOW (ref 8.9–10.3)
Chloride: 112 mmol/L — ABNORMAL HIGH (ref 98–111)
Creatinine, Ser: 0.93 mg/dL (ref 0.44–1.00)
GFR, Estimated: >60 mL/min (ref >60)
Glucose, Bld: 82 mg/dL (ref 70–99)
Potassium: 4 mmol/L (ref 3.5–5.1)
Sodium: 139 mmol/L (ref 135–145)
Total Bilirubin: 1.6 mg/dL — ABNORMAL HIGH (ref 0.3–1.2)
Total Protein: 7.1 g/dL (ref 6.5–8.1)

## 2022-01-07 LAB — CBC
HCT: 31.6 % — ABNORMAL LOW (ref 36.0–46.0)
Hemoglobin: 9.8 g/dL — ABNORMAL LOW (ref 12.0–15.0)
MCH: 29.6 pg (ref 26.0–34.0)
MCHC: 31 g/dL (ref 30.0–36.0)
MCV: 95.5 fL (ref 80.0–100.0)
Platelets: 156 10*3/uL (ref 150–400)
RBC: 3.31 MIL/uL — ABNORMAL LOW (ref 3.87–5.11)
RDW: 19.7 % — ABNORMAL HIGH (ref 11.5–15.5)
WBC: 11 10*3/uL — ABNORMAL HIGH (ref 4.0–10.5)
nRBC: 0.5 % — ABNORMAL HIGH (ref 0.0–0.2)

## 2022-01-07 LAB — HEPARIN LEVEL (UNFRACTIONATED): Heparin Unfractionated: 0.31 IU/mL (ref 0.30–0.70)

## 2022-01-07 LAB — MAGNESIUM: Magnesium: 2 mg/dL (ref 1.7–2.4)

## 2022-01-07 MED ORDER — APIXABAN 5 MG PO TABS: 10.0000 mg | ORAL_TABLET | Freq: Two times a day (BID) | ORAL | 0 refills | Status: DC

## 2022-01-07 MED ORDER — HYDRALAZINE HCL 25 MG PO TABS: 25.0000 mg | ORAL_TABLET | Freq: Three times a day (TID) | ORAL | 0 refills | Status: DC

## 2022-01-07 MED ORDER — APIXABAN 5 MG PO TABS: 5.0000 mg | ORAL_TABLET | Freq: Two times a day (BID) | ORAL | 0 refills | Status: DC

## 2022-01-07 NOTE — TOC Transition Note (Addendum)
Transition of Care [facility]) - CM/SW Discharge Note   Patient Details  Name: Kathryn [name] MRN: [id number] Date of Birth: [birth year]  Transition of Care [facility]) CM/SW Contact:  Alberteen Sam, LCSW Phone Number: 01/07/2022, 3:15 PM   Clinical Narrative:     Patient to discharge home today, agreeable to [facility] PT, CSW has sent referral to [facility] with [facility].   Patient needs DME walker, CSW has ordered via [employer] to be delivered to patient's room, CSW has also requested DME orders be in put by MD.   Patient reports her sister will pick her up today, no further discharge needs identified at this time.   Update: [facility] able to accept.   Final next level of care: [place] Barriers to Discharge: No Barriers Identified   Patient Goals and CMS Choice Patient states their goals for this hospitalization and ongoing recovery are:: to go home CMS Medicare.gov Compare Post Acute Care list provided to:: Patient Choice offered to / list presented to : Patient  Discharge Placement                    Patient and family notified of of transfer: 01/07/22  Discharge Plan and Services                DME Arranged: Gilford Rile rolling DME Agency: [employer] Date DME Agency Contacted: 01/07/22 Time DME Agency Contacted: [id number] Representative spoke with at DME Agency: [place]: PT [place]: [place] ([place]) Date [place]: 01/07/22 Time [place]: [id number] Representative spoke with at [place]: [facility]  Social Determinants of Health ([place]) Interventions     Readmission Risk Interventions No flowsheet data found.

## 2022-01-07 NOTE — Discharge Summary (Signed)
Physician Discharge Summary  Kathryn [name] [id number] DOB: [birth year] DOA: 01/02/2022  PCP: Theotis Burrow, MD  Admit date: 01/02/2022 Discharge date: 01/07/2022  Discharge disposition: Home with home health therapy   Recommendations for Outpatient Follow-Up:   Follow-up with PCP in 1 week Follow-up with Dr. Haig Prophet, gastroenterologist, as scheduled   Discharge Diagnosis:   Principal Problem:   Acute pancreatitis Active Problems:   Cirrhosis ([place])   Hypothyroid   Hypertension   Chest pain   Anemia   Cholelithiasis without cholecystitis   Thrombocytopenia ([place])   Portal vein thrombosis   Physical deconditioning   Hypokalemia   Hypomagnesemia    Discharge Condition: Stable.  Diet recommendation:  Diet Order             Diet - low sodium heart healthy           Diet Heart Room service appropriate? Yes; Fluid consistency: Thin; Fluid restriction: 1200 mL Fluid  Diet effective now                     Code Status: Full Code     Hospital Course:   [name] is a 66 year old woman with medical history significant for asthma, chronic diastolic CHF, hypertension, hypothyroidism, alcohol use disorder, alcoholic liver cirrhosis with portal hypertension and gastric esophageal varices, sarcoidosis, who presented to the hospital with chest pain, diaphoresis, epigastric pain and left upper quadrant pain.  She was found to have acute pancreatitis and portal vein thrombosis with possible extension into SMV.  She was treated with analgesics, IV fluids and IV heparin drip.  She had hypokalemia and hypomagnesemia that were corrected successfully.  She also had thrombocytopenia but this has resolved.  Her condition slowly improved.  She was able to tolerate a regular diet without abdominal pain or vomiting.  She is deemed stable for discharge to home today.  Dr. Haig Prophet, gastroenterologist, said patient was okay for discharge from his standpoint (discussed  via secure chat).  Patient has been advised to avoid alcohol.  Risks, benefits and alternatives to long-term anticoagulation were discussed with the patient and she decided to proceed with treatment with Eliquis.    Medical Consultants:   Gastroenterologist Cardiologist   Discharge Exam:    Vitals:   01/07/22 0428 01/07/22 0758 01/07/22 0806 01/07/22 1147  BP: (!) 148/73 120/62 118/62 133/65  Pulse: 61 63 (!) 59 (!) 57  Resp: 16 18 18 17   Temp: 98 F (36.7 C) 98.2 F (36.8 C) 98 F (36.7 C) 97.7 F (36.5 C)  TempSrc:  Oral  Oral  SpO2: 100% 100% 100% 100%  Weight: 82.1 kg     Height:         GEN: NAD SKIN: Warm and dry EYES: No pallor or icterus ENT: MMM CV: RRR PULM: CTA B ABD: soft, ND, NT, +BS CNS: AAO x 3, non focal EXT: No edema or tenderness   The results of significant diagnostics from this hospitalization (including imaging, microbiology, ancillary and laboratory) are listed below for reference.     Procedures and Diagnostic Studies:   US Abdomen Limited  Result Date: 01/05/2022 CLINICAL DATA:  Ascites.  Evaluate for paracentesis. EXAM: LIMITED ABDOMEN ULTRASOUND FOR ASCITES TECHNIQUE: Limited ultrasound survey for ascites was performed in all four abdominal quadrants. COMPARISON:  CT scan 01/03/2022 FINDINGS: Small volume ascites evident. IMPRESSION: Insufficient ascites volume for safe paracentesis. Electronically Signed   By: Misty Stanley M.D.   On: 01/05/2022 14:11  ECHOCARDIOGRAM LIMITED  Result Date: 01/04/2022    ECHOCARDIOGRAM LIMITED REPORT   Patient Name:   Kathryn [name] Rhine Date of Exam: 01/04/2022 Medical Rec #:  [id number]    Height:       64.0 in Accession #:    [id number]   Weight:       180.0 lb Date of Birth:  [birth year]    BSA:          1.871 m Patient Age:    66 years     BP:           170/82 mmHg Patient Gender: F            HR:           79 bpm. Exam Location:  [facility] Procedure: Limited Echo, Color Doppler and Cardiac Doppler Indications:      Chest Pain  History:         Patient has prior history of Echocardiogram examinations. CHF,                  Asthma; Signs/Symptoms:Murmur.  Sonographer:     L Thornton-Maynard Referring Phys:  [id number] [facility]  Diagnosing Phys: Skeet Latch MD IMPRESSIONS  1. Mild intracavitary gradient with no significant change with Valsalva. Peak velocity 1.2 m/s. Peak gradient 5 mmHg. Left ventricular ejection fraction, by estimation, is 65 to 70%. The left ventricle has normal function. There is moderate asymmetric left ventricular hypertrophy of the basal-septal segment. Left ventricular diastolic parameters were normal.  2. Trivial mitral valve regurgitation.  3. The aortic valve is tricuspid. Aortic valve regurgitation is mild.  4. There is Moderate (Grade III) atheroma plaque involving the ascending aorta.  5. There is normal pulmonary artery systolic pressure. FINDINGS  Left Ventricle: Mild intracavitary gradient with no significant change with Valsalva. Peak velocity 1.2 m/s. Peak gradient 5 mmHg. Left ventricular ejection fraction, by estimation, is 65 to 70%. The left ventricle has normal function. There is moderate  asymmetric left ventricular hypertrophy of the basal-septal segment. Left ventricular diastolic parameters were normal. Indeterminate filling pressures. Right Ventricle: There is normal pulmonary artery systolic pressure. The tricuspid regurgitant velocity is 2.51 m/s, and with an assumed right atrial pressure of 3 mmHg, the estimated right ventricular systolic pressure is [id number] mmHg. Mitral Valve: Trivial mitral valve regurgitation. Tricuspid Valve: Tricuspid valve regurgitation is trivial. Aortic Valve: The aortic valve is tricuspid. Aortic valve regurgitation is mild. Aortic valve peak gradient measures 11.6 mmHg. Aorta: There is moderate (Grade III) atheroma plaque involving the ascending aorta. LEFT VENTRICLE PLAX 2D LVIDd:         3.95 cm     Diastology LVIDs:         2.69 cm     LV e'  medial:    8.38 cm/s LV PW:         1.07 cm     LV E/e' medial:  14.0 LV IVS:        1.59 cm     LV e' lateral:   10.60 cm/s LVOT diam:     1.90 cm     LV E/e' lateral: 11.0 LV SV:         83 LV SV Index:   44 LVOT Area:     2.84 cm  LV Volumes (MOD) LV vol d, MOD A2C: 46.2 ml LV vol d, MOD A4C: 32.5 ml LV vol s, MOD A2C: 6.9 ml LV vol s, MOD A4C: 11.3 ml LV SV MOD A2C:  39.3 ml LV SV MOD A4C:     32.5 ml LV SV MOD BP:      30.0 ml LEFT ATRIUM         Index LA diam:    4.10 cm 2.19 cm/m  AORTIC VALVE AV Area (Vmax): 2.17 cm AV Vmax:        170.00 cm/s AV Peak Grad:   11.6 mmHg LVOT Vmax:      130.00 cm/s LVOT Vmean:     95.000 cm/s LVOT VTI:       0.293 m  AORTA Ao Asc diam: 2.90 cm MITRAL VALVE                TRICUSPID VALVE MV Area (PHT): 4.89 cm     TR Peak grad:   25.2 mmHg MV Decel Time: 155 msec     TR Vmax:        251.00 cm/s MV E velocity: 117.00 cm/s MV A velocity: 81.40 cm/s   SHUNTS MV E/A ratio:  1.44         Systemic VTI:  0.29 m                             Systemic Diam: 1.90 cm Skeet Latch MD Electronically signed by Skeet Latch MD Signature Date/Time: 01/04/2022/2:42:59 PM    Final      Labs:   Basic Metabolic Panel: Recent Labs  Lab 01/03/22 0629 01/04/22 0005 01/05/22 0524 01/06/22 0728 01/07/22 0618  NA 142 140 139 135 139  K 3.7 3.6 3.3* 4.0 4.0  CL 108 107 107 109 112*  CO2 25 27 23 23 24   GLUCOSE 91 118* 102* 78 82  BUN 11 10 7* 8 8  CREATININE 0.61 0.62 0.58 0.68 0.93  CALCIUM 7.6* 7.7* 7.7* 7.9* 8.6*  MG  --  1.8 1.6* 2.0 2.0   GFR Estimated Creatinine Clearance: 62.6 mL/min (by C-G formula based on SCr of 0.93 mg/dL). Liver Function Tests: Recent Labs  Lab 01/03/22 0629 01/04/22 0005 01/05/22 0524 01/06/22 0728 01/07/22 0618  AST 86* 76* 126* 112* 99*  ALT 22 22 24 24 26   ALKPHOS 224* 205* 197* 209* 217*  BILITOT 0.9 1.5* 1.5* 1.8* 1.6*  PROT 6.9 7.0 6.4* 6.7 7.1  ALBUMIN 2.4* 2.3* 2.1* 2.1* 2.3*   Recent Labs  Lab 01/02/22 1827  01/03/22 0629 01/04/22 0005 01/05/22 0524  LIPASE 368* 132* 124* 126*   No results for input(s): AMMONIA in the last 168 hours. Coagulation profile Recent Labs  Lab 01/03/22 0629  INR 1.3*    CBC: Recent Labs  Lab 01/03/22 0629 01/04/22 0005 01/05/22 0524 01/06/22 0728 01/07/22 0618  WBC 7.7 8.3 10.8* 11.3* 11.0*  NEUTROABS  --  7.2 9.4*  --   --   HGB 8.2* 8.9* 8.6* 9.1* 9.8*  HCT 26.3* 28.6* 26.5* 29.0* 31.6*  MCV 95.6 96.6 94.0 98.3 95.5  PLT 125* 79* 113* 141* 156   Cardiac Enzymes: No results for input(s): CKTOTAL, CKMB, CKMBINDEX, TROPONINI in the last 168 hours. BNP: Invalid input(s): POCBNP CBG: Recent Labs  Lab 01/02/22 1808  GLUCAP 72   D-Dimer No results for input(s): DDIMER in the last 72 hours. Hgb A1c No results for input(s): HGBA1C in the last 72 hours. Lipid Profile No results for input(s): CHOL, HDL, LDLCALC, TRIG, CHOLHDL, LDLDIRECT in the last 72 hours. Thyroid function studies No results for input(s): TSH, T4TOTAL, T3FREE, THYROIDAB in the  last 72 hours.  Invalid input(s): FREET3 Anemia work up No results for input(s): VITAMINB12, FOLATE, FERRITIN, TIBC, IRON, RETICCTPCT in the last 72 hours. Microbiology Recent Results (from the past 240 hour(s))  Resp Panel by RT-PCR (Flu A&B, Covid) Nasopharyngeal Swab     Status: None   Collection Time: 01/02/22  8:22 PM   Specimen: Nasopharyngeal Swab; Nasopharyngeal(NP) swabs in vial transport medium  Result Value Ref Range Status   SARS Coronavirus 2 by RT PCR NEGATIVE NEGATIVE Final    Comment: (NOTE) SARS-CoV-2 target nucleic acids are NOT DETECTED.  The SARS-CoV-2 RNA is generally detectable in upper respiratory specimens during the acute phase of infection. The lowest concentration of SARS-CoV-2 viral copies this assay can detect is 138 copies/mL. A negative result does not preclude SARS-Cov-2 infection and should not be used as the sole basis for treatment or other patient management  decisions. A negative result may occur with  improper specimen collection/handling, submission of specimen other than nasopharyngeal swab, presence of viral mutation(s) within the areas targeted by this assay, and inadequate number of viral copies(<138 copies/mL). A negative result must be combined with clinical observations, patient history, and epidemiological information. The expected result is Negative.  Fact Sheet for Patients:  EntrepreneurPulse.com.au  Fact Sheet for Healthcare Providers:  IncredibleEmployment.be  This test is no t yet approved or cleared by the [place] FDA and  has been authorized for detection and/or diagnosis of SARS-CoV-2 by FDA under an Emergency Use Authorization (EUA). This EUA will remain  in effect (meaning this test can be used) for the duration of the COVID-19 declaration under Section 564(b)(1) of the Act, 21 U.S.C.section 360bbb-3(b)(1), unless the authorization is terminated  or revoked sooner.       Influenza A by PCR NEGATIVE NEGATIVE Final   Influenza B by PCR NEGATIVE NEGATIVE Final    Comment: (NOTE) The Xpert Xpress SARS-CoV-2/FLU/RSV plus assay is intended as an aid in the diagnosis of influenza from Nasopharyngeal swab specimens and should not be used as a sole basis for treatment. Nasal washings and aspirates are unacceptable for Xpert Xpress SARS-CoV-2/FLU/RSV testing.  Fact Sheet for Patients: EntrepreneurPulse.com.au  Fact Sheet for Healthcare Providers: IncredibleEmployment.be  This test is not yet approved or cleared by the [place] FDA and has been authorized for detection and/or diagnosis of SARS-CoV-2 by FDA under an Emergency Use Authorization (EUA). This EUA will remain in effect (meaning this test can be used) for the duration of the COVID-19 declaration under Section 564(b)(1) of the Act, 21 U.S.C. section 360bbb-3(b)(1), unless the  authorization is terminated or revoked.  Performed at [facility], [address] [place], [place] [address]      Discharge Instructions:   Discharge Instructions     Diet - low sodium heart healthy   Complete by: As directed    For home use only DME Walker rolling   Complete by: As directed    Walker: With [place]   Patient needs a walker to treat with the following condition: Debility   Increase activity slowly   Complete by: As directed       Allergies as of 01/07/2022       Reactions   Hydrocodone-acetaminophen Other (See Comments)   Reaction: pt can't take anything with tylenol due to her liver disease.    Ibuprofen Other (See Comments)   Esophageal varices   Naproxen Other (See Comments)   Esophageal varices   Propoxyphene Other (See Comments)   Reaction:  pt isn't sure, but knows she can't take it.    Shellfish Allergy Hives   Tamiflu  [oseltamivir Phosphate] Other (See Comments)   Esophageal varices        Medication List     TAKE these medications    amLODipine 10 MG tablet Commonly known as: NORVASC Take 1 tablet (10 mg total) by mouth daily.   apixaban 5 MG Tabs tablet Commonly known as: ELIQUIS Take 2 tablets (10 mg total) by mouth 2 (two) times daily for 3 days.   apixaban 5 MG Tabs tablet Commonly known as: ELIQUIS Take 1 tablet (5 mg total) by mouth 2 (two) times daily. Start taking on: January 10, 2022   ferrous sulfate 325 (65 FE) MG tablet Take 325 mg by mouth every other day.   folic acid 1 MG tablet Commonly known as: FOLVITE Take 1 mg by mouth daily.   hydrALAZINE 25 MG tablet Commonly known as: APRESOLINE Take 1 tablet (25 mg total) by mouth every 8 (eight) hours.   levothyroxine 175 MCG tablet Commonly known as: SYNTHROID Take 1 tablet (175 mcg total) by mouth daily before breakfast.   multivitamin tablet Take 1 tablet by mouth every other day.   nadolol 40 MG tablet Commonly known as: CORGARD Take  40 mg by mouth daily.   pantoprazole 40 MG tablet Commonly known as: PROTONIX Take 1 tablet (40 mg total) by mouth 2 (two) times daily.   spironolactone 100 MG tablet Commonly known as: ALDACTONE Take 100 mg by mouth daily.   thiamine 100 MG tablet Take 100 mg by mouth daily.               Durable Medical Equipment  (From admission, onward)           Start     Ordered   01/07/22 0000  For home use only DME Walker rolling       Question Answer Comment  Walker: With 5 Inch Wheels   Patient needs a walker to treat with the following condition Debility      01/07/22 1520            Follow-up Information     Revelo, Elyse Jarvis, MD. Schedule an appointment as soon as possible for a visit in 1 week(s).   Specialty: Family Medicine Why: february 23,2023 @ 11am Contact information: [address] [place] [address] [phone]                   If you experience worsening of your admission symptoms, develop shortness of breath, life threatening emergency, suicidal or homicidal thoughts you must seek medical attention immediately by calling 911 or calling your MD immediately  if symptoms less severe.   You must read complete instructions/literature along with all the possible adverse reactions/side effects for all the medicines you take and that have been prescribed to you. Take any new medicines after you have completely understood and accept all the possible adverse reactions/side effects.    Please note   You were cared for by a hospitalist during your hospital stay. If you have any questions about your discharge medications or the care you received while you were in the hospital after you are discharged, you can call the unit and asked to speak with the hospitalist on call if the hospitalist that took care of you is not available. Once you are discharged, your primary care physician will handle any further medical issues. Please note that  NO REFILLS for any discharge medications will be authorized once you are discharged, as it is imperative that you return to your primary care physician (or establish a relationship with a primary care physician if you do not have one) for your aftercare needs so that they can reassess your need for medications and monitor your lab values.       Time coordinating discharge: Greater than 30 minutes  Signed:  Jaianna Nicoll  [facility] 01/07/2022, 3:58 PM   Pager on [email or web address]. If 7PM-7AM, please contact night-coverage at [email or web address]

## 2022-01-07 NOTE — Plan of Care (Signed)
°  Problem: Education: Goal: Knowledge of General Education information will improve Description: Including pain rating scale, medication(s)/side effects and non-pharmacologic comfort measures Outcome: Progressing   Problem: Health Behavior/Discharge Planning: Goal: Ability to manage health-related needs will improve Outcome: Progressing   Problem: Clinical Measurements: Goal: Diagnostic test results will improve Outcome: Progressing Goal: Respiratory complications will improve Outcome: Progressing   Problem: Activity: Goal: Risk for activity intolerance will decrease Outcome: Progressing   Problem: Nutrition: Goal: Adequate nutrition will be maintained Outcome: Progressing   Problem: Elimination: Goal: Will not experience complications related to bowel motility Outcome: Progressing Goal: Will not experience complications related to urinary retention Outcome: Progressing   Problem: Pain Managment: Goal: General experience of comfort will improve Outcome: Progressing   Problem: Safety: Goal: Ability to remain free from injury will improve Outcome: Progressing   Problem: Education: Goal: Knowledge of Pancreatitis treatment and prevention will improve Outcome: Progressing   Problem: Health Behavior/Discharge Planning: Goal: Ability to formulate a plan to maintain an alcohol-free life will improve Outcome: Progressing   Problem: Nutritional: Goal: Ability to achieve adequate nutritional intake will improve Outcome: Progressing   Problem: Clinical Measurements: Goal: Complications related to the disease process, condition or treatment will be avoided or minimized Outcome: Progressing

## 2022-01-07 NOTE — Progress Notes (Signed)
Physical Therapy Treatment Patient Details Name: Kathryn [name] MRN: [id number] DOB: 09-02-1956 Today's Date: 01/07/2022   History of Present Illness Patient is a 66 year old female who reports to presented to [facility] ED with  onset of midsternal chest pain felt as pressure as if somebody sitting on her chest with associated diaphoresis and radiation to her left arm with left arm numbness as well as her neck. Patient was admitted for acute pancreatitis. PMH(+) for  asthma, CHF, hypertension hypothyroidism, alcoholic liver cirrhosis, portal hypertension and esophageal varices as well as sarcoidosis    PT Comments    Patient tolerated session well and was agreeable to treatment.  Patient started and ended session sitting EOB. Per patient she ambulated to the restroom earlier this morning independently. Therapeutic exercises focused on BLE strengthening (calf raises, sit to stands), and balance in SL stance (standing marches and 3-way hip). Sit to stand transfers were completed at supervision with no AD or LOB. Patient was also able to demonstrate 2 laps around the nurses station. Due to fatigue second lap required utilization of RW at SBA, however first lap was completed with no AD at SBA. No LOB noted, however decreased cadence noted without AD. Patient is progressing nicely towards her goals, and would continue to benefit from skilled physical therapy in order to optimize patient's return to PLOF. Continue to recommend HHPT upon discharge from acute hospitalization.    Recommendations for follow up therapy are one component of a multi-disciplinary discharge planning process, led by the attending physician.  Recommendations may be updated based on patient status, additional functional criteria and insurance authorization.  Follow Up Recommendations  Home health PT     Assistance Recommended at Discharge Frequent or constant Supervision/Assistance  Patient can return home with the following A little help  with walking and/or transfers;A little help with bathing/dressing/bathroom;Assist for transportation;Help with stairs or ramp for entrance   Equipment Recommendations  Rolling walker (2 wheels)    Recommendations for Other Services       Precautions / Restrictions Precautions Precautions: Fall Restrictions Weight Bearing Restrictions: No     Mobility  Bed Mobility Overal bed mobility:  (not assessed as patient was sitting EOB at beginning/end of session)                  Transfers Overall transfer level: Modified independent Equipment used: None Transfers: Sit to/from Stand Sit to Stand: Modified independent (Device/Increase time) (increased effort to complete)           General transfer comment: x10 additional sit to stands for strenghening    Ambulation/Gait Ambulation/Gait assistance: Supervision Gait Distance (Feet): 340 Feet Assistive device: Rolling walker (2 wheels), None (first 170 feet without AD, once patient fatigued second 170 feet with RW) Gait Pattern/deviations: Step-through pattern, Decreased step length - right, Decreased step length - left, Trunk flexed, Narrow base of support Gait velocity: decreased     General Gait Details: brought RW with patient for when she fatigues, ambulated around nurses station once without AD no LOB and decreased reciprical arm swing at supervision, once patient fatigued completed additional lap around nurses station with RW   Stairs             Wheelchair Mobility    Modified Rankin (Stroke Patients Only)       Balance Overall balance assessment: Needs assistance Sitting-balance support: Bilateral upper extremity supported, Feet supported Sitting balance-Leahy Scale: Good     Standing balance support: No upper extremity supported,  Bilateral upper extremity supported, During functional activity Standing balance-Leahy Scale: Fair Standing balance comment: reliant on AD when fatigued                             Cognition Arousal/Alertness: Awake/alert Behavior During Therapy: WFL for tasks assessed/performed Overall Cognitive Status: Within Functional Limits for tasks assessed                                 General Comments: very pleasant        Exercises Other Exercises Other Exercises: x20 seated calf raises bilateally at EOB Other Exercises: 3 way hip x10 each direction with UUE support from counter top for stability, completed supervision Other Exercises: x10 sit to stands from EOB without AD Other Exercises: x20 standing marches with UUE support from counter top for stability    General Comments General comments (skin integrity, edema, etc.): SpO2 >95% throughout session, HR ranged from 60-66bpm throughout session.      Pertinent Vitals/Pain Pain Assessment Pain Assessment: No/denies pain    Home Living                          Prior Function            PT Goals (current goals can now be found in the care plan section) Acute Rehab PT Goals Patient Stated Goal: to get better PT Goal Formulation: With patient Time For Goal Achievement: 01/20/22 Potential to Achieve Goals: Good Progress towards PT goals: Progressing toward goals    Frequency    Min 2X/week      PT Plan Current plan remains appropriate    Co-evaluation              AM-PAC PT "6 Clicks" Mobility   Outcome Measure  Help needed turning from your back to your side while in a flat bed without using bedrails?: None Help needed moving from lying on your back to sitting on the side of a flat bed without using bedrails?: None Help needed moving to and from a bed to a chair (including a wheelchair)?: A Little Help needed standing up from a chair using your arms (e.g., wheelchair or bedside chair)?: A Little Help needed to walk in hospital room?: A Little Help needed climbing 3-5 steps with a railing? : A Little 6 Click Score: 20    End of Session  Equipment Utilized During Treatment: Gait belt Activity Tolerance: Patient tolerated treatment well;No increased pain Patient left: in bed;with call bell/phone within reach Nurse Communication: Mobility status PT Visit Diagnosis: Unsteadiness on feet (R26.81);Muscle weakness (generalized) (M62.81);Difficulty in walking, not elsewhere classified (R26.2)     Time: 8850-2774 PT Time Calculation (min) (ACUTE ONLY): 18 min  Charges:  $Therapeutic Activity: 8-22 mins                     Angelica Ran, PT  01/07/22. 1:37 PM

## 2022-01-07 NOTE — Progress Notes (Signed)
IV Heparin Dc'd at 1030.

## 2022-04-06 DIAGNOSIS — J45909 Unspecified asthma, uncomplicated: Secondary | ICD-10-CM | POA: Insufficient documentation

## 2022-04-06 DIAGNOSIS — R350 Frequency of micturition: Secondary | ICD-10-CM | POA: Insufficient documentation

## 2022-04-06 DIAGNOSIS — Z7901 Long term (current) use of anticoagulants: Secondary | ICD-10-CM | POA: Diagnosis not present

## 2022-04-06 DIAGNOSIS — R200 Anesthesia of skin: Secondary | ICD-10-CM | POA: Diagnosis not present

## 2022-04-06 DIAGNOSIS — R3915 Urgency of urination: Secondary | ICD-10-CM | POA: Insufficient documentation

## 2022-04-06 DIAGNOSIS — D649 Anemia, unspecified: Secondary | ICD-10-CM | POA: Insufficient documentation

## 2022-04-06 DIAGNOSIS — E162 Hypoglycemia, unspecified: Secondary | ICD-10-CM | POA: Diagnosis not present

## 2022-04-06 DIAGNOSIS — I509 Heart failure, unspecified: Secondary | ICD-10-CM | POA: Insufficient documentation

## 2022-04-06 DIAGNOSIS — K8020 Calculus of gallbladder without cholecystitis without obstruction: Secondary | ICD-10-CM | POA: Diagnosis not present

## 2022-04-06 DIAGNOSIS — R0602 Shortness of breath: Secondary | ICD-10-CM | POA: Insufficient documentation

## 2022-04-06 DIAGNOSIS — E039 Hypothyroidism, unspecified: Secondary | ICD-10-CM | POA: Insufficient documentation

## 2022-04-06 DIAGNOSIS — I110 Hypertensive heart disease with heart failure: Secondary | ICD-10-CM | POA: Insufficient documentation

## 2022-04-06 DIAGNOSIS — D72819 Decreased white blood cell count, unspecified: Secondary | ICD-10-CM | POA: Insufficient documentation

## 2022-04-06 DIAGNOSIS — Z79899 Other long term (current) drug therapy: Secondary | ICD-10-CM | POA: Diagnosis not present

## 2022-04-06 DIAGNOSIS — R079 Chest pain, unspecified: Secondary | ICD-10-CM | POA: Insufficient documentation

## 2022-04-06 DIAGNOSIS — Z20822 Contact with and (suspected) exposure to covid-19: Secondary | ICD-10-CM | POA: Insufficient documentation

## 2022-04-06 DIAGNOSIS — R109 Unspecified abdominal pain: Secondary | ICD-10-CM | POA: Diagnosis present

## 2022-04-06 DIAGNOSIS — R059 Cough, unspecified: Secondary | ICD-10-CM | POA: Insufficient documentation

## 2022-04-06 DIAGNOSIS — F1010 Alcohol abuse, uncomplicated: Secondary | ICD-10-CM | POA: Diagnosis not present

## 2022-04-06 LAB — URINALYSIS, ROUTINE W REFLEX MICROSCOPIC
Bilirubin Urine: NEGATIVE
Glucose, UA: NEGATIVE mg/dL
Hgb urine dipstick: NEGATIVE
Ketones, ur: 20 mg/dL — AB
Nitrite: NEGATIVE
Protein, ur: NEGATIVE mg/dL
Specific Gravity, Urine: 1.018 (ref 1.005–1.030)
pH: 5 (ref 5.0–8.0)

## 2022-04-06 NOTE — ED Provider Triage Note (Signed)
Emergency Medicine Provider Triage Evaluation Note ? ?Kathryn [name] , a 66 y.o. female  was evaluated in triage.  Pt complains of left side back pain for the past week. No injury.  She does report urinary frequency but no dysuria. ? ?Review of Systems  ?Positive: Left side back pain ?Negative: Fever ? ?Physical Exam  ?BP (!) 158/77 (BP Location: Right Arm)   Pulse 74   Temp 98.2 ?F (36.8 ?C) (Oral)   Resp 16   SpO2 100%  ?Gen:   Awake, no distress   ?Resp:  Normal effort  ?MSK:   Moves extremities without difficulty.  Nonfocal left lower back pain ?Other:   ? ?Medical Decision Making  ?Medically screening exam initiated at 8:53 PM.  Appropriate orders placed.  Kathryn [name] was informed that the remainder of the evaluation will be completed by another provider, this initial triage assessment does not replace that evaluation, and the importance of remaining in the ED until their evaluation is complete. ? ?  ?Chinita Pester, FNP ?04/06/22 2246 ? ?

## 2022-04-06 NOTE — ED Triage Notes (Signed)
Pt presents via POV c/o lower back pain x1 week. Reports unrelieved with "muscle cream." ?

## 2022-04-07 ENCOUNTER — Emergency Department
Admission: EM | Admit: 2022-04-07 | Discharge: 2022-04-07 | Disposition: A | Discharge status: Home / Self Care | Payer: Medicare Other | Attending: Emergency Medicine | Admitting: Emergency Medicine

## 2022-04-07 ENCOUNTER — Emergency Department: Payer: Medicare Other

## 2022-04-07 DIAGNOSIS — F1010 Alcohol abuse, uncomplicated: Secondary | ICD-10-CM

## 2022-04-07 DIAGNOSIS — E162 Hypoglycemia, unspecified: Secondary | ICD-10-CM

## 2022-04-07 DIAGNOSIS — R079 Chest pain, unspecified: Secondary | ICD-10-CM

## 2022-04-07 DIAGNOSIS — R059 Cough, unspecified: Secondary | ICD-10-CM

## 2022-04-07 DIAGNOSIS — R109 Unspecified abdominal pain: Secondary | ICD-10-CM

## 2022-04-07 DIAGNOSIS — R200 Anesthesia of skin: Secondary | ICD-10-CM

## 2022-04-07 LAB — CBC WITH DIFFERENTIAL/PLATELET
Abs Immature Granulocytes: 0.01 10*3/uL (ref 0.00–0.07)
Basophils Absolute: 0.1 10*3/uL (ref 0.0–0.1)
Basophils Relative: 2 %
Eosinophils Absolute: 0.1 10*3/uL (ref 0.0–0.5)
Eosinophils Relative: 2 %
HCT: 33.6 % — ABNORMAL LOW (ref 36.0–46.0)
Hemoglobin: 9.5 g/dL — ABNORMAL LOW (ref 12.0–15.0)
Immature Granulocytes: 0 %
Lymphocytes Relative: 28 %
Lymphs Abs: 0.9 10*3/uL (ref 0.7–4.0)
MCH: 21.5 pg — ABNORMAL LOW (ref 26.0–34.0)
MCHC: 28.3 g/dL — ABNORMAL LOW (ref 30.0–36.0)
MCV: 76.2 fL — ABNORMAL LOW (ref 80.0–100.0)
Monocytes Absolute: 0.2 10*3/uL (ref 0.1–1.0)
Monocytes Relative: 5 %
Neutro Abs: 2.1 10*3/uL (ref 1.7–7.7)
Neutrophils Relative %: 63 %
Platelets: 197 10*3/uL (ref 150–400)
RBC: 4.41 MIL/uL (ref 3.87–5.11)
RDW: 18.6 % — ABNORMAL HIGH (ref 11.5–15.5)
WBC: 3.3 10*3/uL — ABNORMAL LOW (ref 4.0–10.5)
nRBC: 0 % (ref 0.0–0.2)

## 2022-04-07 LAB — CBG MONITORING, ED
Glucose-Capillary: 54 mg/dL — ABNORMAL LOW (ref 70–99)
Glucose-Capillary: 110 mg/dL — ABNORMAL HIGH (ref 70–99)
Glucose-Capillary: 79 mg/dL (ref 70–99)

## 2022-04-07 LAB — COMPREHENSIVE METABOLIC PANEL
ALT: 13 U/L (ref 0–44)
AST: 53 U/L — ABNORMAL HIGH (ref 15–41)
Albumin: 3.7 g/dL (ref 3.5–5.0)
Alkaline Phosphatase: 84 U/L (ref 38–126)
Anion gap: 15 (ref 5–15)
BUN: 16 mg/dL (ref 8–23)
CO2: 20 mmol/L — ABNORMAL LOW (ref 22–32)
Calcium: 8.5 mg/dL — ABNORMAL LOW (ref 8.9–10.3)
Chloride: 104 mmol/L (ref 98–111)
Creatinine, Ser: 0.93 mg/dL (ref 0.44–1.00)
GFR, Estimated: >60 mL/min (ref >60)
Glucose, Bld: 62 mg/dL — ABNORMAL LOW (ref 70–99)
Potassium: 4.8 mmol/L (ref 3.5–5.1)
Sodium: 139 mmol/L (ref 135–145)
Total Bilirubin: 1.1 mg/dL (ref 0.3–1.2)
Total Protein: 9.6 g/dL — ABNORMAL HIGH (ref 6.5–8.1)

## 2022-04-07 LAB — TROPONIN I (HIGH SENSITIVITY)
Troponin I (High Sensitivity): 17 ng/L (ref <18)
Troponin I (High Sensitivity): 11 ng/L (ref <18)

## 2022-04-07 LAB — RESP PANEL BY RT-PCR (FLU A&B, COVID) ARPGX2
Influenza A by PCR: NEGATIVE
Influenza B by PCR: NEGATIVE
SARS Coronavirus 2 by RT PCR: NEGATIVE

## 2022-04-07 LAB — URINE DRUG SCREEN, QUALITATIVE (ARMC ONLY)
Amphetamines, Ur Screen: NOT DETECTED
Barbiturates, Ur Screen: NOT DETECTED
Benzodiazepine, Ur Scrn: NOT DETECTED
Cannabinoid 50 Ng, Ur ~~LOC~~: NOT DETECTED
Cocaine Metabolite,Ur ~~LOC~~: NOT DETECTED
MDMA (Ecstasy)Ur Screen: NOT DETECTED
Methadone Scn, Ur: NOT DETECTED
Opiate, Ur Screen: NOT DETECTED
Phencyclidine (PCP) Ur S: NOT DETECTED
Tricyclic, Ur Screen: NOT DETECTED

## 2022-04-07 LAB — MAGNESIUM: Magnesium: 1.9 mg/dL (ref 1.7–2.4)

## 2022-04-07 LAB — BRAIN NATRIURETIC PEPTIDE: B Natriuretic Peptide: 43.8 pg/mL (ref 0.0–100.0)

## 2022-04-07 LAB — LIPASE, BLOOD: Lipase: 35 U/L (ref 11–51)

## 2022-04-07 MED ORDER — THIAMINE HCL 100 MG PO TABS: 100.0000 mg | ORAL_TABLET | Freq: Every day | ORAL | 3 refills | Status: DC

## 2022-04-07 MED ORDER — IOHEXOL 350 MG/ML SOLN
100.0000 mL | Freq: Once | INTRAVENOUS | Status: AC | PRN
  Administered 2022-04-07: 100 mL via INTRAVENOUS

## 2022-04-07 MED ORDER — FENTANYL CITRATE PF 50 MCG/ML IJ SOSY
50.0000 ug | PREFILLED_SYRINGE | Freq: Once | INTRAMUSCULAR | Status: AC
  Administered 2022-04-07: 50 ug via INTRAVENOUS
  Filled 2022-04-07: qty 1

## 2022-04-07 MED ORDER — ONDANSETRON HCL 4 MG/2ML IJ SOLN
4.0000 mg | Freq: Once | INTRAMUSCULAR | Status: AC
Start: 2022-04-07 — End: 2022-04-07
  Administered 2022-04-07: 4 mg via INTRAVENOUS
  Filled 2022-04-07: qty 2

## 2022-04-07 MED ORDER — LIDOCAINE 5 % EX PTCH
1.0000 | MEDICATED_PATCH | CUTANEOUS | 0 refills | Status: DC
Start: 2022-04-07 — End: 2022-12-02

## 2022-04-07 MED ORDER — THIAMINE HCL 100 MG/ML IJ SOLN
100.0000 mg | Freq: Once | INTRAMUSCULAR | Status: AC
Start: 2022-04-07 — End: 2022-04-07
  Administered 2022-04-07: 100 mg via INTRAVENOUS
  Filled 2022-04-07: qty 2

## 2022-04-07 MED ORDER — NEPHRO-VITE RX 1 MG PO TABS: 1.0000 | ORAL_TABLET | Freq: Every day | ORAL | 3 refills | Status: DC

## 2022-04-07 NOTE — ED Notes (Signed)
MD notified about low cbg  of 54 . Pt given 2 cups of orange juice and food to eat ?

## 2022-04-07 NOTE — ED Provider Notes (Signed)
? ?[facility] ?Provider Note ? ? ? Event Date/Time  ? First MD Initiated Contact with Patient 04/07/22 0054   ?  (approximate) ? ? ?History  ? ?Back Pain ? ? ?HPI ? ?Kathryn MinerKathy B [name] is a 66 y.o. female with history of alcohol abuse, cirrhosis, pancreatitis, CHF who presents to the emergency department with multiple complaints.  Patient reports she has had left flank and lower back pain for the past month.  No injury that she can recall.  States she has tried muscle rubs and without relief.  States is an aching pain.  She has numbness in her feet bilaterally which is chronic.  No new numbness or weakness.  No incontinence, urinary retention.  She denies any back surgeries or epidural injections.  No history of diabetes, cancer, HIV or IV drug use.  She does tell me that this feels similar to when she has had pancreatitis before but also when she has had pneumonia.  States she has had a dry cough.  She also tells me that she has had a history of a blood clot but is unable to tell me where.  Patient is on Eliquis.  She states she has had intermittent chest pain and shortness of breath.  No nausea, vomiting, diarrhea.  No dysuria but has had urinary frequency and urgency.  Also tells me that she had numbness in her left face yesterday. ? ? ? ?History provided by patient. ? ? ? ?Past Medical History:  ?Diagnosis Date  ? Alcoholic cirrhosis of liver (HCC)   ? Asthma   ? CHF (congestive heart failure) (HCC)   ? Chronic disease anemia   ? Esophageal varices (HCC)   ? GR I on EGD by Dr Shelle Ironein 09/2014  ? ETOH abuse   ? Hypertension   ? Hypothyroidism   ? Murmur   ? Portal hypertension (HCC)   ? Sarcoidosis   ? ? ?Past Surgical History:  ?Procedure Laterality Date  ? ESOPHAGOGASTRODUODENOSCOPY N/A 06/26/2021  ? Procedure: ESOPHAGOGASTRODUODENOSCOPY (EGD);  Surgeon: Toledo, Boykin Nearingeodoro K, MD;  Location: [facility] ENDOSCOPY;  Service: Gastroenterology;  Laterality: N/A;  ? ESOPHAGOGASTRODUODENOSCOPY (EGD) WITH PROPOFOL  N/A 04/04/2015  ? Procedure: ESOPHAGOGASTRODUODENOSCOPY (EGD) WITH PROPOFOL;  Surgeon: Midge Miniumarren Wohl, MD;  Location: [facility] ENDOSCOPY;  Service: Endoscopy;  Laterality: N/A;  Dr Servando SnareWOHL prefers 1445  ? ESOPHAGOGASTRODUODENOSCOPY W/ BANDING  09/2014  ? Rein  ? TUBAL LIGATION    ? ? ?MEDICATIONS:  ?Prior to Admission medications   ?Medication Sig Start Date End Date Taking? Authorizing Provider  ?amLODipine (NORVASC) 10 MG tablet Take 1 tablet (10 mg total) by mouth daily. 06/28/21 01/02/22  Lynn ItoAmery, Sahar, MD  ?apixaban (ELIQUIS) 5 MG TABS tablet Take 2 tablets (10 mg total) by mouth 2 (two) times daily for 3 days. 01/07/22 01/10/22  Lurene ShadowAyiku, Bernard, MD  ?apixaban (ELIQUIS) 5 MG TABS tablet Take 1 tablet (5 mg total) by mouth 2 (two) times daily. 01/10/22   Lurene ShadowAyiku, Bernard, MD  ?ferrous sulfate 325 (65 FE) MG tablet Take 325 mg by mouth every other day.    [provider]  ?folic acid (FOLVITE) 1 MG tablet Take 1 mg by mouth daily.    [provider]  ?hydrALAZINE (APRESOLINE) 25 MG tablet Take 1 tablet (25 mg total) by mouth every 8 (eight) hours. 01/07/22   Lurene ShadowAyiku, Bernard, MD  ?levothyroxine (SYNTHROID) 175 MCG tablet Take 1 tablet (175 mcg total) by mouth daily before breakfast. 06/27/21 01/02/22  Lynn ItoAmery, Sahar, MD  ?Multiple  Vitamin (MULTIVITAMIN) tablet Take 1 tablet by mouth every other day.    [provider]  ?nadolol (CORGARD) 40 MG tablet Take 40 mg by mouth daily. 09/25/21   [provider]  ?pantoprazole (PROTONIX) 40 MG tablet Take 1 tablet (40 mg total) by mouth 2 (two) times daily. ?Patient not taking: Reported on 01/02/2022 06/27/21 01/02/22  Lynn Ito, MD  ?spironolactone (ALDACTONE) 100 MG tablet Take 100 mg by mouth daily. 09/25/21   [provider]  ?thiamine 100 MG tablet Take 100 mg by mouth daily. 09/25/21   [provider]  ? ? ?Physical Exam  ? ?Triage Vital Signs: ?ED Triage Vitals  ?Enc Vitals Group  ?   BP 04/06/22 2042 (!) 158/77  ?   Pulse Rate 04/06/22 2042 74   ?   Resp 04/06/22 2042 16  ?   Temp 04/06/22 2042 98.2 ?F (36.8 ?C)  ?   Temp Source 04/06/22 2042 Oral  ?   SpO2 04/06/22 2042 100 %  ?   Weight 04/06/22 2053 175 lb (79.4 kg)  ?   Height 04/06/22 2053 5\' 5"  (1.651 m)  ?   Head Circumference --   ?   Peak Flow --   ?   Pain Score --   ?   Pain Loc --   ?   Pain Edu? --   ?   Excl. in GC? --   ? ? ?Most recent vital signs: ?Vitals:  ? 04/07/22 0430 04/07/22 0500  ?BP: (!) 170/78 (!) 170/75  ?Pulse: 84 76  ?Resp: 16 17  ?Temp:    ?SpO2: 96% 92%  ? ? ?CONSTITUTIONAL: Alert and oriented and responds appropriately to questions. Well-appearing; well-nourished ?HEAD: Normocephalic, atraumatic ?EYES: Conjunctivae clear, pupils appear equal, sclera nonicteric ?ENT: normal nose; moist mucous membranes ?NECK: Supple, normal ROM ?CARD: RRR; S1 and S2 appreciated; no murmurs, no clicks, no rubs, no gallops ?RESP: Normal chest excursion without splinting or tachypnea; breath sounds clear and equal bilaterally; no wheezes, no rhonchi, no rales, no hypoxia or respiratory distress, speaking full sentences ?ABD/GI: Normal bowel sounds; non-distended; soft, non-tender, no rebound, no guarding, no peritoneal signs ?BACK: The back appears normal no midline spinal tenderness or step-off or deformity.  She is tender to palpation over the left paraspinal muscles without redness, warmth, soft tissue swelling, rash, ecchymosis or other lesions. ?EXT: Normal ROM in all joints; no deformity noted, no edema; no cyanosis, no calf tenderness or calf swelling ?SKIN: Normal color for age and race; warm; no rash on exposed skin ?NEURO: Moves all extremities equally, normal speech, normal sensation diffusely, cranial nerves II through XII intact, normal gait ?PSYCH: The patient's mood and manner are appropriate. ? ? ?ED Results / Procedures / Treatments  ? ?LABS: ?(all labs ordered are listed, but only abnormal results are displayed) ?Labs Reviewed  ?URINALYSIS, ROUTINE W REFLEX MICROSCOPIC -  Abnormal; Notable for the following components:  ?    Result Value  ? Color, Urine YELLOW (*)   ? APPearance CLOUDY (*)   ? Ketones, ur 20 (*)   ? Leukocytes,Ua TRACE (*)   ? Bacteria, UA RARE (*)   ? All other components within normal limits  ?CBC WITH DIFFERENTIAL/PLATELET - Abnormal; Notable for the following components:  ? WBC 3.3 (*)   ? Hemoglobin 9.5 (*)   ? HCT 33.6 (*)   ? MCV 76.2 (*)   ? MCH 21.5 (*)   ? MCHC 28.3 (*)   ? RDW  18.6 (*)   ? All other components within normal limits  ?COMPREHENSIVE METABOLIC PANEL - Abnormal; Notable for the following components:  ? CO2 20 (*)   ? Glucose, Bld 62 (*)   ? Calcium 8.5 (*)   ? Total Protein 9.6 (*)   ? AST 53 (*)   ? All other components within normal limits  ?CBG MONITORING, ED - Abnormal; Notable for the following components:  ? Glucose-Capillary 54 (*)   ? All other components within normal limits  ?CBG MONITORING, ED - Abnormal; Notable for the following components:  ? Glucose-Capillary 110 (*)   ? All other components within normal limits  ?RESP PANEL BY RT-PCR (FLU A&B, COVID) ARPGX2  ?LIPASE, BLOOD  ?BRAIN NATRIURETIC PEPTIDE  ?MAGNESIUM  ?URINE DRUG SCREEN, QUALITATIVE ([facility] ONLY)  ?CBG MONITORING, ED  ?TROPONIN I (HIGH SENSITIVITY)  ?TROPONIN I (HIGH SENSITIVITY)  ? ? ? ?EKG: ? EKG Interpretation ? ?Date/Time:  Tuesday Apr 07 2022 01:29:07 EDT ?Ventricular Rate:  64 ?PR Interval:  146 ?QRS Duration: 83 ?QT Interval:  464 ?QTC Calculation: 479 ?R Axis:   36 ?Text Interpretation: Sinus rhythm Nonspecific T abnrm, anterolateral leads No significant change since last tracing August 2022 Confirmed by Rochele Raring [phone]) on 04/07/2022 2:18:13 AM ?  ? ?  ? ? ? ?RADIOLOGY: ?My personal review and interpretation of imaging: CT head shows no acute abnormality.  CTA of the chest shows no PE or infiltrate.  CT of the abdomen pelvis shows portal hypertension.  Previous portal vein thrombosis resolved. ? ?I have personally reviewed all radiology reports.   ?CT HEAD  WO CONTRAST ( ) ? ?Result Date: 04/07/2022 ?CLINICAL DATA:  Stroke-like symptoms, initial encounter EXAM: CT HEAD WITHOUT CONTRAST TECHNIQUE: Contiguous axial images were obtained from the base of the sku

## 2022-04-07 NOTE — Discharge Instructions (Addendum)
You may alternate Tylenol 1000 mg every 6 hours as needed for pain, fever and Ibuprofen 800 mg every 6-8 hours as needed for pain, fever.  Please take Ibuprofen with food.  Do not take more than 4000 mg of Tylenol (acetaminophen) in a 24 hour period. ° °

## 2022-04-07 NOTE — ED Notes (Signed)
This RN attempted to get an IV on pt 2x with no success. ?

## 2022-08-12 ENCOUNTER — Other Ambulatory Visit: Payer: Self-pay

## 2022-08-12 ENCOUNTER — Encounter: Payer: Self-pay | Admitting: Emergency Medicine

## 2022-08-12 DIAGNOSIS — R109 Unspecified abdominal pain: Secondary | ICD-10-CM | POA: Insufficient documentation

## 2022-08-12 DIAGNOSIS — K922 Gastrointestinal hemorrhage, unspecified: Secondary | ICD-10-CM | POA: Diagnosis not present

## 2022-08-12 DIAGNOSIS — R059 Cough, unspecified: Secondary | ICD-10-CM | POA: Insufficient documentation

## 2022-08-12 DIAGNOSIS — D649 Anemia, unspecified: Secondary | ICD-10-CM | POA: Diagnosis not present

## 2022-08-12 LAB — URINALYSIS, ROUTINE W REFLEX MICROSCOPIC
Bilirubin Urine: NEGATIVE
Glucose, UA: NEGATIVE mg/dL
Hgb urine dipstick: NEGATIVE
Nitrite: NEGATIVE
Protein, ur: NEGATIVE mg/dL
Specific Gravity, Urine: 1.02 (ref 1.005–1.030)
pH: 6.5 (ref 5.0–8.0)

## 2022-08-12 LAB — BASIC METABOLIC PANEL
Anion gap: 10 (ref 5–15)
BUN: 16 mg/dL (ref 8–23)
CO2: 22 mmol/L (ref 22–32)
Calcium: 8.7 mg/dL — ABNORMAL LOW (ref 8.9–10.3)
Chloride: 105 mmol/L (ref 98–111)
Creatinine, Ser: 1.18 mg/dL — ABNORMAL HIGH (ref 0.44–1.00)
GFR, Estimated: 51 mL/min — ABNORMAL LOW (ref >60)
Glucose, Bld: 100 mg/dL — ABNORMAL HIGH (ref 70–99)
Potassium: 3.6 mmol/L (ref 3.5–5.1)
Sodium: 137 mmol/L (ref 135–145)

## 2022-08-12 LAB — CBC
HCT: 21 % — ABNORMAL LOW (ref 36.0–46.0)
Hemoglobin: 6.6 g/dL — ABNORMAL LOW (ref 12.0–15.0)
MCH: 29.1 pg (ref 26.0–34.0)
MCHC: 31.4 g/dL (ref 30.0–36.0)
MCV: 92.5 fL (ref 80.0–100.0)
Platelets: 116 10*3/uL — ABNORMAL LOW (ref 150–400)
RBC: 2.27 MIL/uL — ABNORMAL LOW (ref 3.87–5.11)
RDW: 19.5 % — ABNORMAL HIGH (ref 11.5–15.5)
WBC: 3.4 10*3/uL — ABNORMAL LOW (ref 4.0–10.5)
nRBC: 0.6 % — ABNORMAL HIGH (ref 0.0–0.2)

## 2022-08-12 LAB — URINALYSIS, MICROSCOPIC (REFLEX)

## 2022-08-12 NOTE — ED Triage Notes (Signed)
Pt via POV from home. Pt c/o L flank pain that radiates around to the LLQ, states that she started having some discomfort when she peed. States she is also nauseous. States pain started 0300 this AM. Denies hx of kidney stone. Pt is A&Ox4 and NAD

## 2022-08-13 ENCOUNTER — Emergency Department: Payer: Medicare Other

## 2022-08-13 ENCOUNTER — Emergency Department
Admission: EM | Admit: 2022-08-13 | Discharge: 2022-08-13 | Disposition: A | Discharge status: Home / Self Care | Payer: Medicare Other | Attending: Emergency Medicine | Admitting: Emergency Medicine

## 2022-08-13 DIAGNOSIS — D649 Anemia, unspecified: Secondary | ICD-10-CM

## 2022-08-13 DIAGNOSIS — K922 Gastrointestinal hemorrhage, unspecified: Secondary | ICD-10-CM

## 2022-08-13 DIAGNOSIS — R109 Unspecified abdominal pain: Secondary | ICD-10-CM

## 2022-08-13 LAB — PREPARE RBC (CROSSMATCH)

## 2022-08-13 MED ORDER — FOSFOMYCIN TROMETHAMINE 3 G PO PACK
3.0000 g | PACK | Freq: Once | ORAL | Status: AC
  Administered 2022-08-13: 3 g via ORAL
  Filled 2022-08-13: qty 3

## 2022-08-13 MED ORDER — SODIUM CHLORIDE 0.9 % IV SOLN
10.0000 mL/h | Freq: Once | INTRAVENOUS | Status: AC
  Administered 2022-08-13: 10 mL/h via INTRAVENOUS

## 2022-08-13 NOTE — ED Notes (Signed)
Not enough blood return to draw Type and Screen. Second IV attempted x 2 by this RN but was unsuccessful. Another RN to attempt.

## 2022-08-13 NOTE — ED Provider Notes (Signed)
[facility] Provider Note    Event Date/Time   First MD Initiated Contact with Patient 08/13/22 0103     (approximate)   History   Flank Pain   HPI  Kathryn [name] is a 66 y.o. female who presents for evaluation of productive cough for about 24 hours and left-sided flank pain that has been going on for longer although she is not sure how long.  She reports that she has sharp pains that go from the left flank and seem to radiate down towards the middle of her lower abdomen/pelvis.  She has had no nausea or vomiting but she was coughing so hard earlier today that she was little bit nauseated and felt like she could not eat anything.  She has had no fever.  She has had no dysuria.  She denies chest pain and shortness of breath in spite of the cough.  She said that she has some chronic peripheral edema and tries to keep her legs elevated and sometimes the ache.     Physical Exam   Triage Vital Signs: ED Triage Vitals  Enc Vitals Group     BP 08/12/22 1801 (!) 186/100     Pulse Rate 08/12/22 1801 70     Resp 08/12/22 1801 18     Temp 08/12/22 1801 98.5 F (36.9 C)     Temp Source 08/12/22 1801 Oral     SpO2 08/12/22 1801 100 %     Weight 08/12/22 1802 77.1 kg (170 lb)     Height 08/12/22 1802 1.626 m (5\' 4" )     Head Circumference --      Peak Flow --      Pain Score 08/12/22 1802 9     Pain Loc --      Pain Edu? --      Excl. in GC? --     Most recent vital signs: Vitals:   08/13/22 0455 08/13/22 0538  BP: (!) 158/80 (!) 172/79  Pulse: (!) 58 (!) 58  Resp: 14 15  Temp: (!) 97.5 F (36.4 C) 97.7 F (36.5 C)  SpO2: 100% 100%     General: Awake, no distress.  Generally well-appearing.  Awake and alert. CV:  Good peripheral perfusion.  Resp:  Normal effort.  Breathing easily and comfortably.  No cough observed during my evaluation. Abd:  No distention.  No tenderness to palpation of the abdomen.  She has easily reproducible left flank  tenderness to percussion. Other:  Moving all 4 extremities without difficulty, mood and affect are normal under the circumstances.   ED Results / Procedures / Treatments   Labs (all labs ordered are listed, but only abnormal results are displayed) Labs Reviewed  URINALYSIS, ROUTINE W REFLEX MICROSCOPIC - Abnormal; Notable for the following components:      Result Value   Ketones, ur TRACE (*)    Leukocytes,Ua SMALL (*)    All other components within normal limits  BASIC METABOLIC PANEL - Abnormal; Notable for the following components:   Glucose, Bld 100 (*)    Creatinine, Ser 1.18 (*)    Calcium 8.7 (*)    GFR, Estimated 51 (*)    All other components within normal limits  CBC - Abnormal; Notable for the following components:   WBC 3.4 (*)    RBC 2.27 (*)    Hemoglobin 6.6 (*)    HCT 21.0 (*)    RDW 19.5 (*)    Platelets 116 (*)  nRBC 0.6 (*)    All other components within normal limits  URINALYSIS, MICROSCOPIC (REFLEX) - Abnormal; Notable for the following components:   Bacteria, UA RARE (*)    All other components within normal limits  URINE CULTURE  TYPE AND SCREEN  PREPARE RBC (CROSSMATCH)  TYPE AND SCREEN     RADIOLOGY She hospital course for details: CT scan shows a question of generalized mesentery inflammation, but no other acute abnormality, and the inflammation was present previously.    PROCEDURES:  Critical Care performed: No  .Critical Care  Performed by: Loleta Rose, MD Authorized by: Loleta Rose, MD   Critical care provider statement:    Critical care time (minutes):  45   Critical care time was exclusive of:  Separately billable procedures and treating other patients   Critical care was necessary to treat or prevent imminent or life-threatening deterioration of the following conditions:  Circulatory failure (symptomatic anemia receiving blood products)   Critical care was time spent personally by me on the following activities:   Development of treatment plan with patient or surrogate, evaluation of patient's response to treatment, examination of patient, obtaining history from patient or surrogate, ordering and performing treatments and interventions, ordering and review of laboratory studies, ordering and review of radiographic studies, pulse oximetry, re-evaluation of patient's condition and review of old charts    MEDICATIONS ORDERED IN ED: Medications  fosfomycin (MONUROL) packet 3 g (has no administration in time range)  0.9 %  sodium chloride infusion (10 mL/hr Intravenous New Bag/Given 08/13/22 0522)  1 unit PRBCs.   IMPRESSION / MDM / ASSESSMENT AND PLAN / ED COURSE  I reviewed the triage vital signs and the nursing notes.                              Differential diagnosis includes, but is not limited to, UTI/pyelonephritis, urinary/renal colic, viral respiratory illness, community-acquired pneumonia, electrolyte or metabolic abnormality.  Patient's presentation is most consistent with acute presentation with potential threat to life or bodily function.  However the patient's overall appearance is reassuring.  She is consistently hypertensive in the emergency department but this may be the result of her being in the ED for nearly 8 hours and she is supposed to be taking hydralazine 25 mg every 8 hours.  Reportedly she used to be on amlodipine but she is allegedly no longer taking it.  However this seems to be noncontributory to her current issues.  Labs/studies ordered: Urinalysis, BNP, CBC, urine culture.  CBC is notable for anemia with a hemoglobin of 6.6.  This is a three-point drop in hemoglobin over the last 4 months and could be 1 reason she is feeling increasingly fatigued and tired.  I will discuss this with her further.  Urinalysis shows 21-50 WBCs in her urine but no significant hematuria.  Her creatinine is up slightly at 1.18 which is of unclear clinical significance, electrolytes are generally  reassuring.  I will treat the pyuria with fosfomycin 3 g by mouth which I ordered, but no indication for outpatient antibiotics.  Urine culture is pending.  Given the reproducible flank pain and tenderness to percussion, I ordered a CT renal stone protocol.  Given her cough I also ordered a two-view chest x-ray.     Clinical Course as of 08/13/22 0102  Thu Aug 13, 2022  0230 I viewed and interpreted the patient's two-view chest x-ray and there is no indication of  acute abnormality such as pneumonia.  I also read the radiologist's report, which confirmed no acute findings.  I also viewed and interpreted the patient's CT renal stone study.  I did not identify any acute abnormality.  The radiologist mentions some mesenteric stranding/inflammation that seems little bit worse than it was before.  However, this does not correlate clinically with an acute infectious or inflammatory process.  The patient has no tenderness to palpation of the abdomen, just left flank tenderness to percussion.  She is hypertensive, not hypotensive, and a low flow state leading to mesenteric ischemia, for example, is exceedingly unlikely.  No indication for further evaluation at this time.  There is also no indication of retroperitoneal bleed that would be the cause for the patient's anemia. [CF]  [id number] After further consideration, given the patient's symptomatic anemia and substantially decreased hemoglobin, I talked with her and consented her for blood transfusion, which she states she has had in the past.  I feel that the benefit outweighs the risk and that will make her feel better.  We talked about admission to the hospital, but at this point it is unclear that she would benefit from inpatient treatment versus close outpatient follow-up after a unit of PRBCs.  She agreed that she does not want to stay in the hospital if she does not have to.  I ordered a type and screen and 1 unit crossmatched blood.  I anticipate reassessment,  but she is comfortable with the plan to treat the anemia and follow-up as an outpatient. [CF]  [phone] With a chaperone present, I performed a rectal exam, and her stool was weakly positive for blood.  However there is no gross blood and she has large external hemorrhoids.  It is entirely possible that the blood is the source of her anemia, but it is also possible it is just a chronic change.  We again discussed staying in the hospital but it is not necessary for her to do so.  She is perfectly comfortable following up as an outpatient.  I discussed the case with Dr. Jari Pigg who is taking over care for the patient at 7 AM.  After the blood transfusion is complete, she will evaluate the patient for appropriateness for discharge as per my pre-existing plan. [CF]    Clinical Course User Index [CF] Hinda Kehr, MD     FINAL CLINICAL IMPRESSION(S) / ED DIAGNOSES   Final diagnoses:  Left flank pain  Anemia, unspecified type  Gastrointestinal hemorrhage, unspecified gastrointestinal hemorrhage type     Rx / DC Orders   ED Discharge Orders     None        Note:  This document was prepared using Dragon voice recognition software and may include unintentional dictation errors.   Hinda Kehr, MD 08/13/22 [phone]

## 2022-08-13 NOTE — ED Notes (Signed)
Lab called stating that type and screen sample hemolyzed and needed to be recollected. Lab informed that patient had been stuck multiple times for sample and they would need to send someone to redraw.

## 2022-08-13 NOTE — ED Notes (Signed)
Patient transported to X-ray 

## 2022-08-13 NOTE — Discharge Instructions (Signed)
As we discussed, your hemoglobin was quite low tonight at 6.6, which is down about 3 points about 4 months ago.  We talked about admitting you to the hospital, but it is not clear that you would benefit from admission, and we gave you a unit of blood here in the emergency department.  You should be able to follow-up as an outpatient.  We provided information about a hematologist with whom you can follow-up (Dr. Janese Banks), as well as a GI doctor to evaluate the possibility that you might be losing some of your blood through your stool.  You can also coordinate all of this through your primary care doctor.  If at any point you develop new or worsening symptoms that concern you, please return immediately to the emergency department.

## 2022-08-13 NOTE — ED Provider Notes (Addendum)
9:17 AM Assumed care for off going team.   Blood pressure (!) 162/90, pulse 61, temperature 98.1 F (36.7 C), temperature source Oral, resp. rate 16, height 5\' 4"  (1.626 m), weight 77.1 kg, SpO2 100 %.  See their HPI for full report but in brief pending discharge after RBC.  Discussed again with patient about admission but she is still wanting to be discharged home.  She understands that she would need to follow-up for recheck hemoglobin next week and if she would need to return if she develop weakness or black stools but at this time she is requesting discharge home and reports tolerating the transfusion well.  Her abdomen is soft and nontender and she denies any other concerns at this time   Discharge instructions were written by Dr. Laretta Alstrom, Royetta Crochet, MD 08/13/22 5093    Vanessa Rhodes, MD 08/13/22 [phone]

## 2022-08-13 NOTE — ED Notes (Signed)
Pt completed 1Unit of PRBCs with 0 complications. Pt awaiting d/c.

## 2022-08-14 LAB — TYPE AND SCREEN
ABO/RH(D): A POS
Antibody Screen: NEGATIVE
Unit division: 0

## 2022-08-14 LAB — URINE CULTURE

## 2022-08-14 LAB — BPAM RBC
Blood Product Expiration Date: 202310182359
ISSUE DATE / TIME: 202309210509
Unit Type and Rh: 6200

## 2022-10-18 ENCOUNTER — Emergency Department: Payer: Medicare Other

## 2022-10-18 ENCOUNTER — Emergency Department
Admission: EM | Admit: 2022-10-18 | Discharge: 2022-10-18 | Disposition: A | Discharge status: Home / Self Care | Payer: Medicare Other | Attending: Emergency Medicine | Admitting: Emergency Medicine

## 2022-10-18 ENCOUNTER — Encounter: Payer: Self-pay | Admitting: Emergency Medicine

## 2022-10-18 DIAGNOSIS — D72819 Decreased white blood cell count, unspecified: Secondary | ICD-10-CM | POA: Diagnosis not present

## 2022-10-18 DIAGNOSIS — R52 Pain, unspecified: Secondary | ICD-10-CM

## 2022-10-18 DIAGNOSIS — M545 Low back pain, unspecified: Secondary | ICD-10-CM | POA: Diagnosis present

## 2022-10-18 DIAGNOSIS — D649 Anemia, unspecified: Secondary | ICD-10-CM | POA: Diagnosis not present

## 2022-10-18 DIAGNOSIS — R300 Dysuria: Secondary | ICD-10-CM | POA: Diagnosis not present

## 2022-10-18 DIAGNOSIS — Z20822 Contact with and (suspected) exposure to covid-19: Secondary | ICD-10-CM | POA: Insufficient documentation

## 2022-10-18 DIAGNOSIS — G8929 Other chronic pain: Secondary | ICD-10-CM | POA: Diagnosis not present

## 2022-10-18 LAB — CBC WITH DIFFERENTIAL/PLATELET
Abs Immature Granulocytes: 0.01 10*3/uL (ref 0.00–0.07)
Basophils Absolute: 0.1 10*3/uL (ref 0.0–0.1)
Basophils Relative: 3 %
Eosinophils Absolute: 0.2 10*3/uL (ref 0.0–0.5)
Eosinophils Relative: 4 %
HCT: 27.5 % — ABNORMAL LOW (ref 36.0–46.0)
Hemoglobin: 8.1 g/dL — ABNORMAL LOW (ref 12.0–15.0)
Immature Granulocytes: 0 %
Lymphocytes Relative: 21 %
Lymphs Abs: 0.7 10*3/uL (ref 0.7–4.0)
MCH: 23.2 pg — ABNORMAL LOW (ref 26.0–34.0)
MCHC: 29.5 g/dL — ABNORMAL LOW (ref 30.0–36.0)
MCV: 78.8 fL — ABNORMAL LOW (ref 80.0–100.0)
Monocytes Absolute: 0.4 10*3/uL (ref 0.1–1.0)
Monocytes Relative: 13 %
Neutro Abs: 2.1 10*3/uL (ref 1.7–7.7)
Neutrophils Relative %: 59 %
Platelets: 155 10*3/uL (ref 150–400)
RBC: 3.49 MIL/uL — ABNORMAL LOW (ref 3.87–5.11)
RDW: 20.1 % — ABNORMAL HIGH (ref 11.5–15.5)
WBC: 3.5 10*3/uL — ABNORMAL LOW (ref 4.0–10.5)
nRBC: 0.9 % — ABNORMAL HIGH (ref 0.0–0.2)

## 2022-10-18 LAB — URINALYSIS, ROUTINE W REFLEX MICROSCOPIC
Bilirubin Urine: NEGATIVE
Glucose, UA: NEGATIVE mg/dL
Hgb urine dipstick: NEGATIVE
Ketones, ur: 5 mg/dL — AB
Leukocytes,Ua: NEGATIVE
Nitrite: NEGATIVE
Protein, ur: 30 mg/dL — AB
Specific Gravity, Urine: 1.026 (ref 1.005–1.030)
pH: 5 (ref 5.0–8.0)

## 2022-10-18 LAB — COMPREHENSIVE METABOLIC PANEL
ALT: 15 U/L (ref 0–44)
AST: 69 U/L — ABNORMAL HIGH (ref 15–41)
Albumin: 3.6 g/dL (ref 3.5–5.0)
Alkaline Phosphatase: 71 U/L (ref 38–126)
Anion gap: 12 (ref 5–15)
BUN: 17 mg/dL (ref 8–23)
CO2: 22 mmol/L (ref 22–32)
Calcium: 8.5 mg/dL — ABNORMAL LOW (ref 8.9–10.3)
Chloride: 106 mmol/L (ref 98–111)
Creatinine, Ser: 0.93 mg/dL (ref 0.44–1.00)
GFR, Estimated: >60 mL/min (ref >60)
Glucose, Bld: 86 mg/dL (ref 70–99)
Potassium: 3.5 mmol/L (ref 3.5–5.1)
Sodium: 140 mmol/L (ref 135–145)
Total Bilirubin: 1 mg/dL (ref 0.3–1.2)
Total Protein: 8.2 g/dL — ABNORMAL HIGH (ref 6.5–8.1)

## 2022-10-18 LAB — RESP PANEL BY RT-PCR (FLU A&B, COVID) ARPGX2
Influenza A by PCR: NEGATIVE
Influenza B by PCR: NEGATIVE
SARS Coronavirus 2 by RT PCR: NEGATIVE

## 2022-10-18 LAB — TROPONIN I (HIGH SENSITIVITY): Troponin I (High Sensitivity): 12 ng/L (ref <18)

## 2022-10-18 MED ORDER — FOSFOMYCIN TROMETHAMINE 3 G PO PACK
3.0000 g | PACK | Freq: Once | ORAL | Status: AC
  Administered 2022-10-18: 3 g via ORAL
  Filled 2022-10-18: qty 3

## 2022-10-18 MED ORDER — TRAMADOL HCL 50 MG PO TABS: 50.0000 mg | ORAL_TABLET | Freq: Four times a day (QID) | ORAL | 0 refills | Status: DC | PRN

## 2022-10-18 MED ORDER — TRAMADOL HCL 50 MG PO TABS
50.0000 mg | ORAL_TABLET | Freq: Once | ORAL | Status: AC
  Administered 2022-10-18: 50 mg via ORAL
  Filled 2022-10-18: qty 1

## 2022-10-18 NOTE — ED Provider Notes (Signed)
[facility] Provider Note    Event Date/Time   First MD Initiated Contact with Patient 10/18/22 [phone]     (approximate)   History   Generalized Body Aches   HPI  Kathryn [name] is a 66 y.o. female who presents for evaluation of generalized body aches as well as low back pain.  She has also had 1 or 2 episodes of dysuria.  She said that she has had problems with her back before but it has been aching more than usual on both sides.  It is nonradiating.  She has not had any recent falls or trauma.  She said that overall she just feels like her whole body is aching.  She has had no specific fever or chills.  She denies chest pain, shortness of breath, cough, nausea, vomiting, and abdominal pain.  She said that the burning when she urinates it has only happened a couple of times and does not happen every time she urinates, but it has started over the last day or so and then generalized body aches and back pain have been going on for about 3 days.  She is worried because she has had urinary tract/kidney infections before that made her very sick and presented with similar body aches and back pain.     Physical Exam   Triage Vital Signs: ED Triage Vitals  Enc Vitals Group     BP 10/18/22 0403 (!) 162/78     Pulse Rate 10/18/22 0403 64     Resp 10/18/22 0403 18     Temp 10/18/22 0403 98.2 F (36.8 C)     Temp Source 10/18/22 0403 Oral     SpO2 10/18/22 0355 98 %     Weight 10/18/22 0356 78 kg (171 lb 15.3 oz)     Height 10/18/22 0356 1.626 m (5\' 4" )     Head Circumference --      Peak Flow --      Pain Score 10/18/22 0355 8     Pain Loc --      Pain Edu? --      Excl. in GC? --     Most recent vital signs: Vitals:   10/18/22 0600 10/18/22 0640  BP: (!) 149/77 136/80  Pulse: (!) 59 67  Resp: 15 20  Temp: 98.3 F (36.8 C) 98.3 F (36.8 C)  SpO2: 98% 100%     General: Awake, no distress.  Generally well-appearing. CV:  Good peripheral perfusion.   Regular rate and rhythm. Resp:  Normal effort.  Lungs are clear to auscultation. Abd:  No distention.  No tenderness to palpation of the abdomen. Other:  No reproducible back pain or tenderness to palpation.  She reports a little bit of discomfort when she sits up and moves around in general, but no palpable deformities.   ED Results / Procedures / Treatments   Labs (all labs ordered are listed, but only abnormal results are displayed) Labs Reviewed  URINALYSIS, ROUTINE W REFLEX MICROSCOPIC - Abnormal; Notable for the following components:      Result Value   Color, Urine AMBER (*)    APPearance CLOUDY (*)    Ketones, ur 5 (*)    Protein, ur 30 (*)    Bacteria, UA RARE (*)    All other components within normal limits  CBC WITH DIFFERENTIAL/PLATELET - Abnormal; Notable for the following components:   WBC 3.5 (*)    RBC 3.49 (*)    Hemoglobin 8.1 (*)  HCT 27.5 (*)    MCV 78.8 (*)    MCH 23.2 (*)    MCHC 29.5 (*)    RDW 20.1 (*)    nRBC 0.9 (*)    All other components within normal limits  COMPREHENSIVE METABOLIC PANEL - Abnormal; Notable for the following components:   Calcium 8.5 (*)    Total Protein 8.2 (*)    AST 69 (*)    All other components within normal limits  RESP PANEL BY RT-PCR (FLU A&B, COVID) [id number]  URINE CULTURE  TROPONIN I (HIGH SENSITIVITY)     EKG  ED ECG REPORT I, Loleta Rose, the attending physician, personally viewed and interpreted this ECG.  Date: 10/18/2022 EKG Time: 4:31 AM Rate: 66 Rhythm: normal sinus rhythm QRS Axis: normal Intervals: normal ST/T Wave abnormalities: normal Narrative Interpretation: no evidence of acute ischemia    RADIOLOGY I viewed and interpreted the patient's two-view chest x-ray.  There is no evidence of pneumonia or other acute abnormality.  I also read the radiologist's report, which confirmed no acute findings.    PROCEDURES:  Critical Care performed: No  Procedures   MEDICATIONS ORDERED IN  ED: Medications  fosfomycin (MONUROL) packet 3 g (3 g Oral Given 10/18/22 0639)  traMADol (ULTRAM) tablet 50 mg (50 mg Oral Given 10/18/22 3557)     IMPRESSION / MDM / ASSESSMENT AND PLAN / ED COURSE  I reviewed the triage vital signs and the nursing notes.                              Differential diagnosis includes, but is not limited to, viral illness, urinary tract infection/pyelonephritis, renal/ureteral colic, pneumonia, electrolyte or metabolic abnormality.  Patient's presentation is most consistent with acute presentation with potential threat to life or bodily function.  Labs/studies ordered: EKG, two-view chest x-ray, urinalysis, respiratory viral panel, CMP, CBC with differential, high-sensitivity troponin.  Patient is well-appearing and in no substantial distress.  She states that she has had issues with her back before and I also verified in the medical record that she has had problems with UTIs in the past.  She has a history of chronic anemia.  Her labs are notable for a very mild leukopenia at 3.5 and anemia with a hemoglobin of 8.1, but her hemoglobin has been quite a bit lower in the past.  Her CMP is essentially normal other than very slight AST elevation and she has a history of cirrhosis.  High-sensitivity troponin is within normal limits and she has not been having chest pain; there is no indication to repeat a troponin.  Her physical exam is reassuring without any specific or reproducible pain or tenderness.  Her urinalysis is a bit cloudy but there are quite a few squamous epithelials and no obvious sign of infection.  Given her concern over prior infections and the mild dysuria, I will treat empirically with fosfomycin 3 g p.o.  However there is no indication for a prescription for antibiotics.  I went over all her results and my presumed diagnosis of generalized viral syndrome leading to the myalgias and general malaise as well as exacerbation of her chronic back  pain, as well as the possibility of a mild UTI.  There is no indication for advanced imaging.  Initially I considered whether or not hospitalization would be required, but given the reassuring work-up and no clear indication of an acute or emergent medical condition requiring hospitalization or that  would reconsult in loss of life or limb, I believe she is appropriate for discharge and outpatient follow-up.  The patient and the sister agree with the plan.  I gave a dose of fosfomycin 3 g p.o. in the ED as well as tramadol 50 mg p.o.; I was going to recommend acetaminophen and/or ibuprofen, but according to her chart she is not supposed to take either 1, so I feel that a low-dose tramadol would be an appropriate substitute.  She will follow-up with her primary care doctor.         FINAL CLINICAL IMPRESSION(S) / ED DIAGNOSES   Final diagnoses:  Chronic low back pain, unspecified back pain laterality, unspecified whether sciatica present  Dysuria  Generalized body aches  Chronic anemia     Rx / DC Orders   ED Discharge Orders          Ordered    traMADol (ULTRAM) 50 MG tablet  Every 6 hours PRN        10/18/22 0630             Note:  This document was prepared using Dragon voice recognition software and may include unintentional dictation errors.   Loleta Rose, MD 10/18/22 [phone]

## 2022-10-18 NOTE — ED Triage Notes (Signed)
Pt presents via POV with complaints of generalized body aches that started 3 days ago. Pt also has a complaint of dysuria that started last week with associated lower back pain. Denies CP or SOB.

## 2022-10-18 NOTE — ED Notes (Signed)
DC instructions given verbally and in writing, understanding voiced.  Pt left in stable condition via wheelchair with daughter at side.

## 2022-10-18 NOTE — ED Notes (Signed)
This RN obtained the patients labs ordered by EDP, while obtaining the labs the patient endorses CP. EKG obtained, acuity changed based on the patients complaints.

## 2022-10-18 NOTE — Discharge Instructions (Addendum)
Your workup in the Emergency Department today was reassuring.  We did not find any specific acute abnormalities.  We recommend you drink plenty of fluids, take your regular medications and/or any new ones prescribed today, and follow up with the doctor(s) listed in these documents as recommended.  Tell your doctor that we gave you a one-time dose of an antibiotic called fosfomycin in case you are developing a mild urinary tract infection.  Return to the Emergency Department if you develop new or worsening symptoms that concern you.

## 2022-10-19 LAB — URINE CULTURE

## 2022-12-02 ENCOUNTER — Emergency Department: Payer: Medicare Other

## 2022-12-02 ENCOUNTER — Other Ambulatory Visit: Payer: Self-pay

## 2022-12-02 ENCOUNTER — Encounter: Payer: Self-pay | Admitting: Emergency Medicine

## 2022-12-02 ENCOUNTER — Inpatient Hospital Stay
Admission: EM | Admit: 2022-12-02 | Discharge: 2022-12-05 | DRG: 438 | Disposition: A | Discharge status: Home / Self Care | Payer: Medicare Other | Attending: Internal Medicine | Admitting: Internal Medicine

## 2022-12-02 DIAGNOSIS — I8510 Secondary esophageal varices without bleeding: Secondary | ICD-10-CM | POA: Diagnosis present

## 2022-12-02 DIAGNOSIS — K766 Portal hypertension: Secondary | ICD-10-CM | POA: Diagnosis present

## 2022-12-02 DIAGNOSIS — K8020 Calculus of gallbladder without cholecystitis without obstruction: Secondary | ICD-10-CM | POA: Diagnosis present

## 2022-12-02 DIAGNOSIS — D869 Sarcoidosis, unspecified: Secondary | ICD-10-CM | POA: Diagnosis present

## 2022-12-02 DIAGNOSIS — K7030 Alcoholic cirrhosis of liver without ascites: Secondary | ICD-10-CM | POA: Diagnosis present

## 2022-12-02 DIAGNOSIS — Z91148 Patient's other noncompliance with medication regimen for other reason: Secondary | ICD-10-CM

## 2022-12-02 DIAGNOSIS — D649 Anemia, unspecified: Secondary | ICD-10-CM | POA: Diagnosis present

## 2022-12-02 DIAGNOSIS — F1090 Alcohol use, unspecified, uncomplicated: Secondary | ICD-10-CM | POA: Insufficient documentation

## 2022-12-02 DIAGNOSIS — I351 Nonrheumatic aortic (valve) insufficiency: Secondary | ICD-10-CM | POA: Diagnosis present

## 2022-12-02 DIAGNOSIS — I700 Atherosclerosis of aorta: Secondary | ICD-10-CM | POA: Diagnosis present

## 2022-12-02 DIAGNOSIS — D509 Iron deficiency anemia, unspecified: Secondary | ICD-10-CM | POA: Diagnosis present

## 2022-12-02 DIAGNOSIS — E039 Hypothyroidism, unspecified: Secondary | ICD-10-CM | POA: Diagnosis present

## 2022-12-02 DIAGNOSIS — Z7901 Long term (current) use of anticoagulants: Secondary | ICD-10-CM | POA: Diagnosis not present

## 2022-12-02 DIAGNOSIS — Z8616 Personal history of COVID-19: Secondary | ICD-10-CM

## 2022-12-02 DIAGNOSIS — D62 Acute posthemorrhagic anemia: Secondary | ICD-10-CM | POA: Diagnosis present

## 2022-12-02 DIAGNOSIS — Z79890 Hormone replacement therapy (postmenopausal): Secondary | ICD-10-CM

## 2022-12-02 DIAGNOSIS — K859 Acute pancreatitis without necrosis or infection, unspecified: Principal | ICD-10-CM | POA: Diagnosis present

## 2022-12-02 DIAGNOSIS — K3189 Other diseases of stomach and duodenum: Secondary | ICD-10-CM | POA: Diagnosis present

## 2022-12-02 DIAGNOSIS — I81 Portal vein thrombosis: Secondary | ICD-10-CM | POA: Diagnosis present

## 2022-12-02 DIAGNOSIS — Z7141 Alcohol abuse counseling and surveillance of alcoholic: Secondary | ICD-10-CM | POA: Diagnosis not present

## 2022-12-02 DIAGNOSIS — Z886 Allergy status to analgesic agent status: Secondary | ICD-10-CM

## 2022-12-02 DIAGNOSIS — F1010 Alcohol abuse, uncomplicated: Secondary | ICD-10-CM | POA: Diagnosis present

## 2022-12-02 DIAGNOSIS — Z91013 Allergy to seafood: Secondary | ICD-10-CM

## 2022-12-02 DIAGNOSIS — D638 Anemia in other chronic diseases classified elsewhere: Secondary | ICD-10-CM | POA: Diagnosis present

## 2022-12-02 DIAGNOSIS — K852 Alcohol induced acute pancreatitis without necrosis or infection: Secondary | ICD-10-CM

## 2022-12-02 DIAGNOSIS — I10 Essential (primary) hypertension: Secondary | ICD-10-CM | POA: Diagnosis present

## 2022-12-02 DIAGNOSIS — Z888 Allergy status to other drugs, medicaments and biological substances status: Secondary | ICD-10-CM

## 2022-12-02 DIAGNOSIS — Z79899 Other long term (current) drug therapy: Secondary | ICD-10-CM

## 2022-12-02 DIAGNOSIS — K7460 Unspecified cirrhosis of liver: Secondary | ICD-10-CM | POA: Diagnosis present

## 2022-12-02 DIAGNOSIS — Z885 Allergy status to narcotic agent status: Secondary | ICD-10-CM

## 2022-12-02 DIAGNOSIS — K7031 Alcoholic cirrhosis of liver with ascites: Secondary | ICD-10-CM | POA: Diagnosis not present

## 2022-12-02 DIAGNOSIS — R072 Precordial pain: Secondary | ICD-10-CM | POA: Diagnosis present

## 2022-12-02 DIAGNOSIS — D508 Other iron deficiency anemias: Secondary | ICD-10-CM | POA: Diagnosis not present

## 2022-12-02 DIAGNOSIS — R5381 Other malaise: Secondary | ICD-10-CM | POA: Diagnosis present

## 2022-12-02 DIAGNOSIS — Z8249 Family history of ischemic heart disease and other diseases of the circulatory system: Secondary | ICD-10-CM

## 2022-12-02 LAB — ETHANOL: Alcohol, Ethyl (B): 40 mg/dL — ABNORMAL HIGH (ref <10)

## 2022-12-02 LAB — CBC
HCT: 25.4 % — ABNORMAL LOW (ref 36.0–46.0)
Hemoglobin: 7.4 g/dL — ABNORMAL LOW (ref 12.0–15.0)
MCH: 23.1 pg — ABNORMAL LOW (ref 26.0–34.0)
MCHC: 29.1 g/dL — ABNORMAL LOW (ref 30.0–36.0)
MCV: 79.4 fL — ABNORMAL LOW (ref 80.0–100.0)
Platelets: 126 10*3/uL — ABNORMAL LOW (ref 150–400)
RBC: 3.2 MIL/uL — ABNORMAL LOW (ref 3.87–5.11)
RDW: 27.6 % — ABNORMAL HIGH (ref 11.5–15.5)
WBC: 2.8 10*3/uL — ABNORMAL LOW (ref 4.0–10.5)
nRBC: 0 % (ref 0.0–0.2)

## 2022-12-02 LAB — HEPATIC FUNCTION PANEL
ALT: 23 U/L (ref 0–44)
AST: 92 U/L — ABNORMAL HIGH (ref 15–41)
Albumin: 3.6 g/dL (ref 3.5–5.0)
Alkaline Phosphatase: 97 U/L (ref 38–126)
Bilirubin, Direct: 0.1 mg/dL (ref 0.0–0.2)
Indirect Bilirubin: 0.7 mg/dL (ref 0.3–0.9)
Total Bilirubin: 0.8 mg/dL (ref 0.3–1.2)
Total Protein: 8.5 g/dL — ABNORMAL HIGH (ref 6.5–8.1)

## 2022-12-02 LAB — MAGNESIUM: Magnesium: 2.1 mg/dL (ref 1.7–2.4)

## 2022-12-02 LAB — BASIC METABOLIC PANEL
Anion gap: 14 (ref 5–15)
BUN: 15 mg/dL (ref 8–23)
CO2: 23 mmol/L (ref 22–32)
Calcium: 9 mg/dL (ref 8.9–10.3)
Chloride: 104 mmol/L (ref 98–111)
Creatinine, Ser: 0.93 mg/dL (ref 0.44–1.00)
GFR, Estimated: >60 mL/min (ref >60)
Glucose, Bld: 79 mg/dL (ref 70–99)
Potassium: 3.6 mmol/L (ref 3.5–5.1)
Sodium: 141 mmol/L (ref 135–145)

## 2022-12-02 LAB — TRIGLYCERIDES: Triglycerides: 73 mg/dL (ref <150)

## 2022-12-02 LAB — TROPONIN I (HIGH SENSITIVITY)
Troponin I (High Sensitivity): 8 ng/L (ref <18)
Troponin I (High Sensitivity): 7 ng/L (ref <18)

## 2022-12-02 LAB — LIPASE, BLOOD: Lipase: 415 U/L — ABNORMAL HIGH (ref 11–51)

## 2022-12-02 MED ORDER — FENTANYL CITRATE PF 50 MCG/ML IJ SOSY: 25.0000 ug | PREFILLED_SYRINGE | INTRAMUSCULAR | Status: DC | PRN

## 2022-12-02 MED ORDER — LACTATED RINGERS IV BOLUS
1000.0000 mL | Freq: Once | INTRAVENOUS | Status: AC
  Administered 2022-12-02: 1000 mL via INTRAVENOUS

## 2022-12-02 MED ORDER — LORAZEPAM 2 MG/ML IJ SOLN
1.0000 mg | INTRAMUSCULAR | Status: DC | PRN
  Administered 2022-12-02: 2 mg via INTRAVENOUS
  Filled 2022-12-02: qty 1

## 2022-12-02 MED ORDER — FOLIC ACID 1 MG PO TABS
1.0000 mg | ORAL_TABLET | Freq: Every day | ORAL | Status: DC
  Administered 2022-12-02 – 2022-12-05 (×3): 1 mg via ORAL
  Filled 2022-12-02 (×3): qty 1

## 2022-12-02 MED ORDER — SODIUM CHLORIDE 0.9 % IV SOLN
12.5000 mg | Freq: Four times a day (QID) | INTRAVENOUS | Status: DC | PRN
  Administered 2022-12-02: 12.5 mg via INTRAVENOUS
  Filled 2022-12-02: qty 0.5

## 2022-12-02 MED ORDER — ACETAMINOPHEN 650 MG RE SUPP: 325.0000 mg | Freq: Four times a day (QID) | RECTAL | Status: DC | PRN

## 2022-12-02 MED ORDER — NADOLOL 40 MG PO TABS
40.0000 mg | ORAL_TABLET | Freq: Every day | ORAL | Status: DC
  Administered 2022-12-02 – 2022-12-05 (×3): 40 mg via ORAL
  Filled 2022-12-02 (×5): qty 1

## 2022-12-02 MED ORDER — THIAMINE HCL 100 MG PO TABS
100.0000 mg | ORAL_TABLET | Freq: Every day | ORAL | Status: DC
  Administered 2022-12-02 – 2022-12-05 (×3): 100 mg via ORAL
  Filled 2022-12-02 (×7): qty 1

## 2022-12-02 MED ORDER — LEVOTHYROXINE SODIUM 50 MCG PO TABS
175.0000 ug | ORAL_TABLET | Freq: Every day | ORAL | Status: DC
  Administered 2022-12-03 – 2022-12-05 (×3): 175 ug via ORAL
  Filled 2022-12-02 (×3): qty 2

## 2022-12-02 MED ORDER — FERROUS SULFATE 325 (65 FE) MG PO TABS
325.0000 mg | ORAL_TABLET | ORAL | Status: DC
  Administered 2022-12-02: 325 mg via ORAL
  Filled 2022-12-02 (×2): qty 1

## 2022-12-02 MED ORDER — RENA-VITE PO TABS: 1.0000 | ORAL_TABLET | Freq: Every day | ORAL | Status: DC

## 2022-12-02 MED ORDER — B COMPLEX-C PO TABS
1.0000 | ORAL_TABLET | Freq: Every day | ORAL | Status: DC
  Administered 2022-12-03 – 2022-12-05 (×2): 1 via ORAL
  Filled 2022-12-02 (×3): qty 1

## 2022-12-02 MED ORDER — LORAZEPAM 1 MG PO TABS: 1.0000 mg | ORAL_TABLET | ORAL | Status: DC | PRN

## 2022-12-02 MED ORDER — SPIRONOLACTONE 25 MG PO TABS
100.0000 mg | ORAL_TABLET | Freq: Every day | ORAL | Status: DC
  Administered 2022-12-02 – 2022-12-03 (×2): 100 mg via ORAL
  Filled 2022-12-02 (×2): qty 4

## 2022-12-02 MED ORDER — FOLIC ACID 1 MG PO TABS: 1.0000 mg | ORAL_TABLET | Freq: Every day | ORAL | Status: DC

## 2022-12-02 MED ORDER — ONE-DAILY MULTI VITAMINS PO TABS: 1.0000 | ORAL_TABLET | ORAL | Status: DC

## 2022-12-02 MED ORDER — ONDANSETRON HCL 4 MG PO TABS: 4.0000 mg | ORAL_TABLET | Freq: Four times a day (QID) | ORAL | Status: DC | PRN

## 2022-12-02 MED ORDER — ONDANSETRON HCL 4 MG/2ML IJ SOLN
4.0000 mg | Freq: Once | INTRAMUSCULAR | Status: AC
  Administered 2022-12-02: 4 mg via INTRAVENOUS
  Filled 2022-12-02: qty 2

## 2022-12-02 MED ORDER — AMLODIPINE BESYLATE 10 MG PO TABS
10.0000 mg | ORAL_TABLET | Freq: Every day | ORAL | Status: DC
  Administered 2022-12-02 – 2022-12-03 (×2): 10 mg via ORAL
  Filled 2022-12-02 (×2): qty 1

## 2022-12-02 MED ORDER — ACETAMINOPHEN 650 MG RE SUPP: 650.0000 mg | Freq: Four times a day (QID) | RECTAL | Status: DC | PRN

## 2022-12-02 MED ORDER — THIAMINE MONONITRATE 100 MG PO TABS: 100.0000 mg | ORAL_TABLET | Freq: Every day | ORAL | Status: DC

## 2022-12-02 MED ORDER — ENOXAPARIN SODIUM 40 MG/0.4ML IJ SOSY: 40.0000 mg | PREFILLED_SYRINGE | INTRAMUSCULAR | Status: DC

## 2022-12-02 MED ORDER — NITROGLYCERIN 0.4 MG SL SUBL
0.4000 mg | SUBLINGUAL_TABLET | SUBLINGUAL | Status: DC | PRN
  Administered 2022-12-02: 0.4 mg via SUBLINGUAL
  Filled 2022-12-02: qty 1

## 2022-12-02 MED ORDER — ONDANSETRON HCL 4 MG/2ML IJ SOLN
4.0000 mg | Freq: Four times a day (QID) | INTRAMUSCULAR | Status: DC | PRN
  Administered 2022-12-02: 4 mg via INTRAVENOUS
  Filled 2022-12-02 (×2): qty 2

## 2022-12-02 MED ORDER — APIXABAN 5 MG PO TABS
5.0000 mg | ORAL_TABLET | Freq: Two times a day (BID) | ORAL | Status: DC
  Administered 2022-12-02 – 2022-12-03 (×3): 5 mg via ORAL
  Filled 2022-12-02 (×3): qty 1

## 2022-12-02 MED ORDER — THIAMINE HCL 100 MG/ML IJ SOLN: 100.0000 mg | Freq: Every day | INTRAMUSCULAR | Status: DC

## 2022-12-02 MED ORDER — ACETAMINOPHEN 325 MG PO TABS: 650.0000 mg | ORAL_TABLET | Freq: Four times a day (QID) | ORAL | Status: DC | PRN

## 2022-12-02 MED ORDER — ACETAMINOPHEN 325 MG PO TABS
325.0000 mg | ORAL_TABLET | Freq: Four times a day (QID) | ORAL | Status: DC | PRN
  Filled 2022-12-02: qty 1

## 2022-12-02 MED ORDER — ADULT MULTIVITAMIN W/MINERALS CH: 1.0000 | ORAL_TABLET | Freq: Every day | ORAL | Status: DC

## 2022-12-02 MED ORDER — ALUM & MAG HYDROXIDE-SIMETH 200-200-20 MG/5ML PO SUSP
30.0000 mL | Freq: Once | ORAL | Status: AC
  Administered 2022-12-02: 30 mL via ORAL
  Filled 2022-12-02: qty 30

## 2022-12-02 MED ORDER — PANTOPRAZOLE SODIUM 40 MG PO TBEC
40.0000 mg | DELAYED_RELEASE_TABLET | Freq: Two times a day (BID) | ORAL | Status: DC
  Administered 2022-12-02 – 2022-12-03 (×3): 40 mg via ORAL
  Filled 2022-12-02 (×3): qty 1

## 2022-12-02 MED ORDER — SENNOSIDES-DOCUSATE SODIUM 8.6-50 MG PO TABS: 1.0000 | ORAL_TABLET | Freq: Every evening | ORAL | Status: DC | PRN

## 2022-12-02 MED ORDER — MORPHINE SULFATE (PF) 2 MG/ML IV SOLN
2.0000 mg | INTRAVENOUS | Status: DC | PRN
  Administered 2022-12-02 – 2022-12-04 (×3): 2 mg via INTRAVENOUS
  Filled 2022-12-02 (×3): qty 1

## 2022-12-02 MED ORDER — LACTATED RINGERS IV SOLN: INTRAVENOUS | Status: AC

## 2022-12-02 MED ORDER — HYDRALAZINE HCL 20 MG/ML IJ SOLN: 5.0000 mg | Freq: Three times a day (TID) | INTRAMUSCULAR | Status: DC | PRN

## 2022-12-02 MED ORDER — MORPHINE SULFATE (PF) 2 MG/ML IV SOLN
2.0000 mg | Freq: Once | INTRAVENOUS | Status: AC
  Administered 2022-12-02: 2 mg via INTRAVENOUS
  Filled 2022-12-02: qty 1

## 2022-12-02 MED ORDER — IOHEXOL 350 MG/ML SOLN
100.0000 mL | Freq: Once | INTRAVENOUS | Status: AC | PRN
  Administered 2022-12-02: 100 mL via INTRAVENOUS

## 2022-12-02 NOTE — Assessment & Plan Note (Signed)
-   Levothyroxine 175 mcg daily resumed for 12/03/2022

## 2022-12-02 NOTE — Assessment & Plan Note (Addendum)
-   Appears mild acute on chronic anemia, presumed multifactorial including iron deficiency anemia versus continued alcohol use - Patient denies alarming signs of GI blood loss including melena, coffee-ground emesis, bright red blood per rectum, hematemesis, hemoptysis - Resumed home ferrous sulfate 325 mg daily - Serum hemoglobin level was in the range of 8.1-9.5

## 2022-12-02 NOTE — ED Triage Notes (Signed)
Pt via EMS from home. Pt c/o chest pain centralized that radiates to the middle her back, states that it started last night but got worse this morning. Pt has a hx of CHF, denies any swelling but states she was SOB. EMS gave 324 ASA, 1 Nitro Spray, and 1inch of Nitro paste. 145/88 BP, 83 HR, 95% on RA, 141 CBG. Pt is A&Ox4 and NAD

## 2022-12-02 NOTE — Assessment & Plan Note (Addendum)
-   Check EtOH level - Initiate CIWA protocol - Folic acid 1 mg daily, multivitamin 1 tablet, vitamin B complex, thiamine 100 mg daily were resumed - Extensive counseling at bedside regarding the importance of alcohol cessation given that patient has prior admission for pancreatitis diagnosis

## 2022-12-02 NOTE — Hospital Course (Addendum)
Kathryn [name] is a 67 year old female with medical history of alcohol use disorder, hypothyroid, history of pancreatitis, history of cholelithiasis, history of thrombocytopenia, portal vein thrombosis with extension into SMV, on Eliquis, physical deconditioning, hypertension, history of hepatic cirrhosis with portal hypertension, who presents emergency department for chief concerns of chest pain.  Initial vitals in the ED showed temperature of 97.7, respiration rate 20, heart rate 82, blood pressure 134/80, SpO2 of 100% on room air.  Serum sodium is 141, potassium 3.6, chloride 104, bicarb 23, BUN 15, serum creatinine 0.93, nonfasting blood glucose is 79, WBC 2.8, hemoglobin 7.4, platelets of 126.  Lipase level was 1 415.  High sensitive troponin was 8 and on repeat is 7.  CTA chest abdomen pelvis for dissection: Was read as no evidence of aortic dissection, aneurysm or acute arterial abnormality.  No acute cardiopulmonary process.  No abnormality of the abdomen or pelvis.  Hepatic cirrhosis with portal hypertension and diffuse mild mesenteric edema.  ED treatment: Maalox, morphine 2 mg IV one-time dose, ondansetron 4 mg IV.

## 2022-12-02 NOTE — ED Provider Notes (Signed)
[facility] Provider Note    Event Date/Time   First MD Initiated Contact with Patient 12/02/22 [phone]     (approximate)   History   Chest Pain   HPI  Kathryn [name] is a 67 y.o. female past medical history of cirrhosis, CHF, hypertension, hypothyroidism, sarcoid who presents because of chest pain.  Patient had an episode of chest pain last night when she was laying down that resolved.  Then this morning woke up and has had fairly constant substernal chest pain that radiates to her back.  It is nonpleuritic and nonexertional.  Associated with belching no nausea or vomiting is also associated with shortness of breath.  Denies history of similar pain.  Denies associate abdominal pain.  Denies fevers chills cough.  Denies history of coronary disease but does have CHF.     Past Medical History:  Diagnosis Date   Alcoholic cirrhosis of liver (HCC)    Asthma    CHF (congestive heart failure) (HCC)    Chronic disease anemia    Esophageal varices (HCC)    GR I on EGD by Dr Shelle Iron 09/2014   ETOH abuse    Hypertension    Hypothyroidism    Murmur    Portal hypertension (HCC)    Sarcoidosis     Patient Active Problem List   Diagnosis Date Noted   Hypokalemia 01/05/2022   Hypomagnesemia 01/05/2022   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   Physical deconditioning 01/04/2022   Cholelithiasis without cholecystitis 01/03/2022   Thrombocytopenia (HCC) 01/03/2022   Aortic atherosclerosis (HCC)    Acute pancreatitis 01/02/2022   Symptomatic anemia 06/24/2021   Hypertensive urgency 06/24/2021   Asthma 06/24/2021   CKD (chronic kidney disease), stage IIIa 06/24/2021   Chronic CHF (HCC) 06/24/2021   Pancytopenia (HCC) 06/24/2021   Community acquired pneumonia 11/20/2019   Pneumonia due to COVID-19 virus 11/20/2019   Anemia 06/12/2016   Hypoglycemia 02/11/2016   Hematemesis 04/04/2015   Cirrhosis (HCC) 04/04/2015   Esophageal varices (HCC) 04/04/2015   Hypothyroid  04/04/2015   Hypertension 04/04/2015   Chest pain 04/04/2015     Physical Exam  Triage Vital Signs: ED Triage Vitals  Enc Vitals Group     BP 12/02/22 0916 134/80     Pulse Rate 12/02/22 0916 82     Resp 12/02/22 0916 20     Temp 12/02/22 0916 97.7 F (36.5 C)     Temp src --      SpO2 12/02/22 0916 100 %     Weight 12/02/22 0904 176 lb (79.8 kg)     Height 12/02/22 0904 5' 3.5" (1.613 m)     Head Circumference --      Peak Flow --      Pain Score 12/02/22 0904 8     Pain Loc --      Pain Edu? --      Excl. in GC? --     Most recent vital signs: Vitals:   12/02/22 0916 12/02/22 1211  BP: 134/80 (!) 163/87  Pulse: 82 85  Resp: 20 18  Temp: 97.7 F (36.5 C) 97.8 F (36.6 C)  SpO2: 100% 100%     General: Awake, no distress.  CV:  Good peripheral perfusion.  Pitting edema bilateral lower extremities resp:  Normal effort.  Abd:  No distention.  Tenderness palpation epigastric region mid abdomen with voluntary guarding Neuro:             Awake, Alert, Oriented x  3  Other:     ED Results / Procedures / Treatments  Labs (all labs ordered are listed, but only abnormal results are displayed) Labs Reviewed  CBC - Abnormal; Notable for the following components:      Result Value   WBC 2.8 (*)    RBC 3.20 (*)    Hemoglobin 7.4 (*)    HCT 25.4 (*)    MCV 79.4 (*)    MCH 23.1 (*)    MCHC 29.1 (*)    RDW 27.6 (*)    Platelets 126 (*)    All other components within normal limits  HEPATIC FUNCTION PANEL - Abnormal; Notable for the following components:   Total Protein 8.5 (*)    AST 92 (*)    All other components within normal limits  LIPASE, BLOOD - Abnormal; Notable for the following components:   Lipase 415 (*)    All other components within normal limits  BASIC METABOLIC PANEL  TROPONIN I (HIGH SENSITIVITY)  TROPONIN I (HIGH SENSITIVITY)     EKG  EKG reviewed interpreted myself shows sinus rhythm, low voltage, normal axis normal intervals no acute  ischemic changes   RADIOLOGY I reviewed and interpreted the CXR which does not show any acute cardiopulmonary process    PROCEDURES:  Critical Care performed: No  Procedures    MEDICATIONS ORDERED IN ED: Medications  nitroGLYCERIN (NITROSTAT) SL tablet 0.4 mg (0.4 mg Sublingual Given 12/02/22 1008)  alum & mag hydroxide-simeth (MAALOX/MYLANTA) 200-200-20 MG/5ML suspension 30 mL (30 mLs Oral Given 12/02/22 1007)  ondansetron (ZOFRAN) injection 4 mg (4 mg Intravenous Given 12/02/22 1008)  morphine (PF) 2 MG/ML injection 2 mg (2 mg Intravenous Given 12/02/22 1122)  iohexol (OMNIPAQUE) 350 MG/ML injection 100 mL (100 mLs Intravenous Contrast Given 12/02/22 1129)     IMPRESSION / MDM / ASSESSMENT AND PLAN / ED COURSE  I reviewed the triage vital signs and the nursing notes.                              Patient's presentation is most consistent with acute presentation with potential threat to life or bodily function.  Differential diagnosis includes, but is not limited to, ACS, aortic dissection, gastritis, GERD, pancreatitis  Patient is a 67 year old female with multiple medical comorbidities including alcoholism, cirrhosis and CHF presenting with chest pain.  She had first episode of chest pain last night that resolved and then woke up with lower midline chest pain rating to the back.  Associated with dyspnea and belching.  Vitals are reassuring overall on exam she looks well.  She is not frankly complaining of abdominal pain but on abdominal exam she is tender in the epigastric region.  Of note she does have chronic alcohol use disorder.  EKG is not obviously ischemic.  Plan to obtain cardiac enzymes, his lipase and hepatic function panel.  Will give nitro and GI cocktail.  Will obtain CT angio dissection study both to evaluate for intra-abdominal process and rule out dissection.  Patient's lipase is elevated at 415.  CT angio the dissection study does not have any acute findings and  there is no mention of pancreatic inflammation however patient does have typical pain for pancreatitis with elevated lipase do think this is clinically pancreatitis.  Patient still in a significant amount of pain after morphine.  Will give another dose of Dilaudid and fluids.     FINAL CLINICAL IMPRESSION(S) / ED DIAGNOSES  Final diagnoses:  Acute pancreatitis, unspecified complication status, unspecified pancreatitis type     Rx / DC Orders   ED Discharge Orders     None        Note:  This document was prepared using Dragon voice recognition software and may include unintentional dictation errors.   Rada Hay, MD 12/02/22 [phone]

## 2022-12-02 NOTE — Assessment & Plan Note (Addendum)
-   Resume amlodipine 10 mg daily, nadolol 40 mg daily, spironolactone 100 mg daily were resumed - Hydralazine 5 mg IV every 8 hours as needed for SBP greater than 175, 4 days ordered

## 2022-12-02 NOTE — Assessment & Plan Note (Addendum)
-   Added triglyceride level to prior collection, discussed with lab; if triglycerides are elevated, will consider adding insulin GGT as appropriate - Symptomatic support: Morphine 2 mg IV every 4 hours as needed for moderate pain, 4 doses ordered; fentanyl 25 mcg IV every 3 hours as needed for severe pain, 3 doses ordered - AM team to reevaluate patient at bedside for continued opioid pain medication requirement - Clear liquids with order for advance as tolerated to full liquids - LR 1 L bolus and LR at 150 mL/h, 1 day ordered - Admit to telemetry medical, inpatient

## 2022-12-02 NOTE — Assessment & Plan Note (Signed)
-   Eliquis 5 mg p.o. twice daily resumed - CTA was negative for PE - Educated patient extensively with her sisters at bedside regarding the importance of following medication directions especially Eliquis as this is prescribed to help prevent clots from forming in her blood vessels - Patient endorses understanding and compliance

## 2022-12-02 NOTE — H&P (Addendum)
History and Physical   Kathryn [name] ZDG:[id number] DOB: 05-17-1956 DOA: 12/02/2022  PCP: Preston Fleeting, MD  Outpatient Specialists: Dr. Norma Fredrickson, gastroenterology Patient coming from: home via EMS  I have personally briefly reviewed patient's old medical records in [facility] EMR.  Chief Concern: chest pain  HPI: [name] is a 67 year old female with medical history of alcohol use disorder, hypothyroid, history of pancreatitis, history of cholelithiasis, history of thrombocytopenia, portal vein thrombosis with extension into SMV, on Eliquis, physical deconditioning, hypertension, history of hepatic cirrhosis with portal hypertension, who presents emergency department for chief concerns of chest pain.  Initial vitals in the ED showed temperature of 97.7, respiration rate 20, heart rate 82, blood pressure 134/80, SpO2 of 100% on room air.  Serum sodium is 141, potassium 3.6, chloride 104, bicarb 23, BUN 15, serum creatinine 0.93, nonfasting blood glucose is 79, WBC 2.8, hemoglobin 7.4, platelets of 126.  Lipase level was 1 415.  High sensitive troponin was 8 and on repeat is 7.  CTA chest abdomen pelvis for dissection: Was read as no evidence of aortic dissection, aneurysm or acute arterial abnormality.  No acute cardiopulmonary process.  No abnormality of the abdomen or pelvis.  Hepatic cirrhosis with portal hypertension and diffuse mild mesenteric edema.  ED treatment: Maalox, morphine 2 mg IV one-time dose, ondansetron 4 mg IV. -----------------------  At bedside, she is able to tell me her name, age, current year, current location. Her sisters, Claudid and Arabella Merles were in the room with patient's permission.  She reports she developed chest pain in the middle of last night, with radiation to her back. She reports it was persistent and it only went away with the IV pain medications. She reports the pain is similar to her prior episode of pancreatitis however it is in greater  magnitude.  She last had an etoh drink, one shot of etoh last night.  She denies history of withdrawal.  She denies abdominal pain, dysuria, hematuria, diarrhea, syncope, blood in her stool, blood in her urine.    Patient denies NSAID, ibuprofen, Aleve, BC powder, Goody powder over-the-counter use.  Patient reports that she has not been regularly following prescription directions to take Eliquis twice a day.  She states that she occasionally misses doses, here and there.  Social history: She lives in her home. Her grandson stays with her. She denies tobacco use. She denies recreational drug use. She endorses etoh use, drinking about one shot every other day.   ROS: Constitutional: no weight change, no fever ENT/Mouth: no sore throat, no rhinorrhea Eyes: no eye pain, no vision changes Cardiovascular: + chest pain, no dyspnea,  no edema, no palpitations Respiratory: no cough, no sputum, no wheezing Gastrointestinal: no nausea, no vomiting, no diarrhea, no constipation, + epigastric pain Genitourinary: no urinary incontinence, no dysuria, no hematuria Musculoskeletal: no arthralgias, no myalgias Skin: no skin lesions, no pruritus, Neuro: + weakness, no loss of consciousness, no syncope Psych: no anxiety, no depression, + decrease appetite Heme/Lymph: no bruising, no bleeding  ED Course: Discussed with emergency medicine provider, patient requiring hospitalization for chief concerns of acute pancreatitis.  Assessment/Plan  Principal Problem:   Acute pancreatitis Active Problems:   Cirrhosis (HCC)   Hypertension   Hypothyroid   Anemia   Symptomatic anemia   Portal vein thrombosis   Physical deconditioning   Alcohol use disorder   Sarcoid   Assessment and Plan:  * Acute pancreatitis - Added triglyceride level to prior collection, discussed with lab;  if triglycerides are elevated, will consider adding insulin GGT as appropriate - Symptomatic support: Morphine 2 mg IV every 4  hours as needed for moderate pain, 4 doses ordered; fentanyl 25 mcg IV every 3 hours as needed for severe pain, 3 doses ordered - AM team to reevaluate patient at bedside for continued opioid pain medication requirement - Clear liquids with order for advance as tolerated to full liquids - LR 1 L bolus and LR at 150 mL/h, 1 day ordered - Admit to telemetry medical, inpatient  Hypertension - Resume amlodipine 10 mg daily, nadolol 40 mg daily, spironolactone 100 mg daily were resumed - Hydralazine 5 mg IV every 8 hours as needed for SBP greater than 175, 4 days ordered  Hypothyroid - Levothyroxine 175 mcg daily resumed for 12/03/2022  Alcohol use disorder - Check EtOH level - Initiate CIWA protocol - Folic acid 1 mg daily, multivitamin 1 tablet, vitamin B complex, thiamine 100 mg daily were resumed - Extensive counseling at bedside regarding the importance of alcohol cessation given that patient has prior admission for pancreatitis diagnosis  Portal vein thrombosis - Eliquis 5 mg p.o. twice daily resumed - CTA was negative for PE - Educated patient extensively with her sisters at bedside regarding the importance of following medication directions especially Eliquis as this is prescribed to help prevent clots from forming in her blood vessels - Patient endorses understanding and compliance  Anemia - Appears mild acute on chronic anemia, presumed multifactorial including iron deficiency anemia versus continued alcohol use - Patient denies alarming signs of GI blood loss including melena, coffee-ground emesis, bright red blood per rectum, hematemesis, hemoptysis - Resumed home ferrous sulfate 325 mg daily - Serum hemoglobin level was in the range of 8.1-9.5  Chart reviewed.   Complete echo on 01/04/2022: Ejection fraction estimated at 65 to 70%.  Asymmetric left ventricular hypertrophy with basal septal segment.  Left ventricular diastolic parameters are normal.  Aortic valve is tricuspid.   Aortic valve regurgitation is mild.  Moderate atheroma plaque involving the ascending aorta.  Hospitalization from 2/10/: Cancel prescription 2023 to 01/07/2022: Patient was admitted for chest pain, epigastric pain and she was found to have acute pancreatitis and portal vein thrombosis with possible extension into the SMV.  Patient was started on IV heparin drip and transition to p.o. Eliquis.  Her potassium and magnesium level were corrected.  DVT prophylaxis: Eliquis 5 mg BID Code Status: full code Diet: Clear liquids, can advance to full liquids Family Communication: Updated sisters at bedside  Disposition Plan: pending clinical course Consults called: none at this time Admission status: telemetry medical, inpatient  Past Medical History:  Diagnosis Date   Alcoholic cirrhosis of liver (HCC)    Asthma    CHF (congestive heart failure) (HCC)    Chronic disease anemia    Esophageal varices (HCC)    GR I on EGD by Dr Shelle Iron 09/2014   ETOH abuse    Hypertension    Hypothyroidism    Murmur    Portal hypertension (HCC)    Sarcoidosis    Past Surgical History:  Procedure Laterality Date   ESOPHAGOGASTRODUODENOSCOPY N/A 06/26/2021   Procedure: ESOPHAGOGASTRODUODENOSCOPY (EGD);  Surgeon: Toledo, Boykin Nearing, MD;  Location: [facility] ENDOSCOPY;  Service: Gastroenterology;  Laterality: N/A;   ESOPHAGOGASTRODUODENOSCOPY (EGD) WITH PROPOFOL N/A 04/04/2015   Procedure: ESOPHAGOGASTRODUODENOSCOPY (EGD) WITH PROPOFOL;  Surgeon: Midge Minium, MD;  Location: [facility] ENDOSCOPY;  Service: Endoscopy;  Laterality: N/A;  Dr Servando Snare prefers 1445   ESOPHAGOGASTRODUODENOSCOPY W/ BANDING  09/2014   [place]   TUBAL LIGATION     Social History:  reports that she has never smoked. She has never used smokeless tobacco. She reports current alcohol use of about 15.0 standard drinks of alcohol per week. She reports that she does not use drugs.  Allergies  Allergen Reactions   Hydrocodone-Acetaminophen Other (See Comments)     Reaction: pt can't take anything with tylenol due to her liver disease.    Ibuprofen Other (See Comments)    Esophageal varices   Naproxen Other (See Comments)    Esophageal varices   Propoxyphene Other (See Comments)    Reaction: pt isn't sure, but knows she can't take it.    Shellfish Allergy Hives   Tamiflu  [Oseltamivir Phosphate] Other (See Comments)    Esophageal varices   Family History  Problem Relation Age of Onset   Aneurysm Father    COPD Father    Heart attack Mother    Family history: Family history reviewed and not pertinent.  Prior to Admission medications   Medication Sig Start Date End Date Taking? Authorizing Provider  amLODipine (NORVASC) 10 MG tablet Take 1 tablet (10 mg total) by mouth daily. 06/28/21 01/02/22  Nolberto Hanlon, MD  apixaban (ELIQUIS) 5 MG TABS tablet Take 2 tablets (10 mg total) by mouth 2 (two) times daily for 3 days. 01/07/22 01/10/22  Jennye Boroughs, MD  apixaban (ELIQUIS) 5 MG TABS tablet Take 1 tablet (5 mg total) by mouth 2 (two) times daily. 01/10/22   Jennye Boroughs, MD  B Complex-C-Folic Acid (B COMPLEX-VITAMIN C-FOLIC ACID) 1 MG tablet Take 1 tablet by mouth daily with breakfast. 04/07/22   Ward, Delice Bison, DO  ferrous sulfate 325 (65 FE) MG tablet Take 325 mg by mouth every other day.    [provider]  folic acid (FOLVITE) 1 MG tablet Take 1 mg by mouth daily.    [provider]  hydrALAZINE (APRESOLINE) 25 MG tablet Take 1 tablet (25 mg total) by mouth every 8 (eight) hours. 01/07/22   Jennye Boroughs, MD  levothyroxine (SYNTHROID) 175 MCG tablet Take 1 tablet (175 mcg total) by mouth daily before breakfast. 06/27/21 01/02/22  Nolberto Hanlon, MD  lidocaine (LIDODERM) 5 % Place 1 patch onto the skin daily. Remove & Discard patch within 12 hours or as directed by MD 04/07/22   Ward, Delice Bison, DO  Multiple Vitamin (MULTIVITAMIN) tablet Take 1 tablet by mouth every other day.    [provider]  nadolol (CORGARD) 40 MG tablet  Take 40 mg by mouth daily. 09/25/21   [provider]  pantoprazole (PROTONIX) 40 MG tablet Take 1 tablet (40 mg total) by mouth 2 (two) times daily. Patient not taking: Reported on 01/02/2022 06/27/21 01/02/22  Nolberto Hanlon, MD  spironolactone (ALDACTONE) 100 MG tablet Take 100 mg by mouth daily. 09/25/21   [provider]  thiamine 100 MG tablet Take 100 mg by mouth daily. 09/25/21   [provider]  thiamine 100 MG tablet Take 1 tablet (100 mg total) by mouth daily. 04/07/22   Ward, Delice Bison, DO  traMADol (ULTRAM) 50 MG tablet Take 1 tablet (50 mg total) by mouth every 6 (six) hours as needed for moderate pain or severe pain. 10/18/22   Hinda Kehr, MD   Physical Exam: Vitals:   12/02/22 0904 12/02/22 0916 12/02/22 1211 12/02/22 1300  BP:  134/80 (!) 163/87 (!) 173/79  Pulse:  82 85 77  Resp:  20 18  Temp:  97.7 F (36.5 C) 97.8 F (36.6 C)   TempSrc:   Oral   SpO2:  100% 100% 100%  Weight: 79.8 kg     Height: 5' 3.5" (1.613 m)      Constitutional: appears older than chronological age, NAD, calm, comfortable Eyes: PERRL, lids and conjunctivae normal ENMT: Mucous membranes are moist. Posterior pharynx clear of any exudate or lesions. Age-appropriate dentition. Hearing appropriate Neck: normal, supple, no masses, no thyromegaly Respiratory: clear to auscultation bilaterally, no wheezing, no crackles. Normal respiratory effort. No accessory muscle use.  Cardiovascular: Regular rate and rhythm, no murmurs / rubs / gallops. No extremity edema. 2+ pedal pulses. No carotid bruits.  Abdomen: obese abdomen, no tenderness, no masses palpated, no hepatosplenomegaly. Bowel sounds positive.  Musculoskeletal: no clubbing / cyanosis. No joint deformity upper and lower extremities. Good ROM, no contractures, no atrophy. Normal muscle tone.  Skin: no rashes, lesions, ulcers. No induration Neurologic: Sensation intact. Strength 5/5 in all 4.  Psychiatric: Normal judgment and  insight. Alert and oriented x 3. Normal mood.   EKG: independently reviewed, showing sinus rhythm with rate of 81, QTc 487  Chest x-ray on Admission: I personally reviewed and I agree with radiologist reading as below.  CT Angio Chest/Abd/Pel for Dissection W and/or Wo Contrast  Result Date: 12/02/2022 CLINICAL DATA:  Acute aortic syndrome suspected.  Chest pain. EXAM: CT ANGIOGRAPHY CHEST, ABDOMEN AND PELVIS TECHNIQUE: Non-contrast CT of the chest was initially obtained. Multidetector CT imaging through the chest, abdomen and pelvis was performed using the standard protocol during bolus administration of intravenous contrast. Multiplanar reconstructed images and MIPs were obtained and reviewed to evaluate the vascular anatomy. RADIATION DOSE REDUCTION: This exam was performed according to the departmental dose-optimization program which includes automated exposure control, adjustment of the mA and/or kV according to patient size and/or use of iterative reconstruction technique. CONTRAST:  120mL OMNIPAQUE IOHEXOL 350 MG/ML SOLN COMPARISON:  Prior CT scan of the chest, abdomen and pelvis 04/07/2022 and 02/05/2016 FINDINGS: CTA CHEST FINDINGS Cardiovascular: Initial noncontrast enhanced CT imaging of the chest demonstrates no evidence of acute intramural hematoma. Following the administration of intravenous contrast, there is excellent opacification of the thoracic aorta. Two vessel aortic arch. The right brachiocephalic and left common carotid artery share a common origin. No evidence of aneurysm or dissection. Scattered atherosclerotic plaque along the aorta and the coronary arteries. The heart is normal in size. No pericardial effusion. Excellent opacification of the pulmonary artery as well. No evidence of pulmonary embolus. Mediastinum/Nodes: Unremarkable CT appearance of the thyroid gland. No suspicious mediastinal or hilar adenopathy. No soft tissue mediastinal mass. The thoracic esophagus is  unremarkable. Similar appearance of a small amount of fluid adjacent to the esophagus and extending toward the esophageal hiatus. This likely represents trace ascites through a sliding hiatal hernia. Lungs/Pleura: No focal airspace infiltrate, pulmonary edema, pleural effusion or pneumothorax. No suspicious pulmonary mass or nodule. Isolated pulmonary cyst versus emphysematous bleb in the right lower lobe. Musculoskeletal: No chest wall abnormality. No acute or significant osseous findings. Review of the MIP images confirms the above findings. CTA ABDOMEN AND PELVIS FINDINGS VASCULAR Aorta: Scattered atherosclerotic calcifications. No aneurysm or dissection. Celiac: Patent without evidence of aneurysm, dissection, vasculitis or significant stenosis. SMA: Patent without evidence of aneurysm, dissection, vasculitis or significant stenosis. Renals: Both renal arteries are patent without evidence of aneurysm, dissection, vasculitis, fibromuscular dysplasia or significant stenosis. IMA: Patent without evidence of aneurysm, dissection, vasculitis or significant stenosis. Inflow: Patent without  evidence of aneurysm, dissection, vasculitis or significant stenosis. Veins: No focal venous abnormality. Review of the MIP images confirms the above findings. NON-VASCULAR Hepatobiliary: Heterogeneous low attenuation of the hepatic parenchyma. Diffusely nodular contour. Findings are consistent with hepatic steatosis and cirrhosis. No enhancing lesion. Stones present within the gallbladder. No intra or extrahepatic biliary ductal dilatation. Pancreas: Unremarkable. No pancreatic ductal dilatation or surrounding inflammatory changes. Spleen: Mild splenomegaly. Adrenals/Urinary Tract: Normal adrenal glands. Stable appearance of the kidneys. Simple cyst exophytic from the lower pole of the right kidney. No imaging follow-up recommended. Small low-attenuation lesions in the left kidney are too small to characterize but unchanged  compared to prior imaging. Ureters and bladder are unremarkable. Stomach/Bowel: No focal bowel wall thickening. No evidence of obstruction. Normal appendix in the right lower quadrant. Lymphatic: No suspicious lymphadenopathy. Reproductive: Fibroid uterus.  No adnexal masses. Other: Mild mesenteric edema likely secondary to portal hypertension. Musculoskeletal: No acute fracture or aggressive appearing lytic or blastic osseous lesion. Hemangioma in the L5 vertebral body. Review of the MIP images confirms the above findings. IMPRESSION: 1. No evidence of aortic dissection, aneurysm or other acute arterial abnormality. 2. No acute cardiopulmonary process. 3. No acute abnormality within the abdomen or pelvis. 4. Stable low-attenuation collection adjacent to the esophagus in the lower mediastinum and extending toward the esophageal hiatus. Findings are favored to reflect herniated fat and trace ascitic fluid along a sliding esophageal hernia. Findings are unchanged across prior studies dating back to at least 2017. 5. Hepatic cirrhosis with portal hypertension and diffuse mild mesenteric edema. 6. Cholelithiasis. 7. Additional ancillary findings as above without interval change. Electronically Signed   By: Malachy Moan M.D.   On: 12/02/2022 11:49   DG Chest 2 View  Result Date: 12/02/2022 CLINICAL DATA:  Chest pain and shortness of breath. EXAM: CHEST - 2 VIEW COMPARISON:  Chest two views 10/18/2022 and 08/13/2022 FINDINGS: Cardiac silhouette and mediastinal contours are within normal limits. The lungs are clear. No pleural effusion or pneumothorax. Moderate anterior disc space narrowing multiple upper thoracic levels. IMPRESSION: No active cardiopulmonary disease. Electronically Signed   By: Neita Garnet M.D.   On: 12/02/2022 09:42    Labs on Admission: I have personally reviewed following labs  CBC: Recent Labs  Lab 12/02/22 0945  WBC 2.8*  HGB 7.4*  HCT 25.4*  MCV 79.4*  PLT 126*   Basic  Metabolic Panel: Recent Labs  Lab 12/02/22 0945  NA 141  K 3.6  CL 104  CO2 23  GLUCOSE 79  BUN 15  CREATININE 0.93  CALCIUM 9.0   GFR: Estimated Creatinine Clearance: 60.2 mL/min (by C-G formula based on SCr of 0.93 mg/dL).  Liver Function Tests: Recent Labs  Lab 12/02/22 0945  AST 92*  ALT 23  ALKPHOS 97  BILITOT 0.8  PROT 8.5*  ALBUMIN 3.6   Recent Labs  Lab 12/02/22 0945  LIPASE 415*   Urine analysis:    Component Value Date/Time   COLORURINE AMBER (A) 10/18/2022 0404   APPEARANCEUR CLOUDY (A) 10/18/2022 0404   APPEARANCEUR Clear 09/25/2014 1610   LABSPEC 1.026 10/18/2022 0404   LABSPEC 1.023 09/25/2014 1610   PHURINE 5.0 10/18/2022 0404   GLUCOSEU NEGATIVE 10/18/2022 0404   GLUCOSEU Negative 09/25/2014 1610   HGBUR NEGATIVE 10/18/2022 0404   BILIRUBINUR NEGATIVE 10/18/2022 0404   BILIRUBINUR Negative 09/25/2014 1610   KETONESUR 5 (A) 10/18/2022 0404   PROTEINUR 30 (A) 10/18/2022 0404   NITRITE NEGATIVE 10/18/2022 0404   LEUKOCYTESUR NEGATIVE  10/18/2022 0404   LEUKOCYTESUR Negative 09/25/2014 1610   This document was prepared using Dragon Voice Recognition software and may include unintentional dictation errors.  Dr. Sedalia Muta [facility]  If 7PM-7AM, please contact overnight-coverage provider If 7AM-7PM, please contact day coverage provider [email or web address]  12/02/2022, 2:22 PM

## 2022-12-03 DIAGNOSIS — K7031 Alcoholic cirrhosis of liver with ascites: Secondary | ICD-10-CM

## 2022-12-03 DIAGNOSIS — D508 Other iron deficiency anemias: Secondary | ICD-10-CM

## 2022-12-03 DIAGNOSIS — K852 Alcohol induced acute pancreatitis without necrosis or infection: Secondary | ICD-10-CM | POA: Diagnosis not present

## 2022-12-03 DIAGNOSIS — I81 Portal vein thrombosis: Secondary | ICD-10-CM | POA: Diagnosis not present

## 2022-12-03 DIAGNOSIS — F1090 Alcohol use, unspecified, uncomplicated: Secondary | ICD-10-CM

## 2022-12-03 DIAGNOSIS — D649 Anemia, unspecified: Secondary | ICD-10-CM | POA: Diagnosis not present

## 2022-12-03 LAB — CBC
HCT: 20.6 % — ABNORMAL LOW (ref 36.0–46.0)
Hemoglobin: 6.2 g/dL — ABNORMAL LOW (ref 12.0–15.0)
MCH: 23.6 pg — ABNORMAL LOW (ref 26.0–34.0)
MCHC: 30.1 g/dL (ref 30.0–36.0)
MCV: 78.3 fL — ABNORMAL LOW (ref 80.0–100.0)
Platelets: 92 10*3/uL — ABNORMAL LOW (ref 150–400)
RBC: 2.63 MIL/uL — ABNORMAL LOW (ref 3.87–5.11)
RDW: 26.8 % — ABNORMAL HIGH (ref 11.5–15.5)
WBC: 3.3 10*3/uL — ABNORMAL LOW (ref 4.0–10.5)
nRBC: 1.2 % — ABNORMAL HIGH (ref 0.0–0.2)

## 2022-12-03 LAB — BASIC METABOLIC PANEL
Anion gap: 9 (ref 5–15)
BUN: 14 mg/dL (ref 8–23)
CO2: 25 mmol/L (ref 22–32)
Calcium: 8.3 mg/dL — ABNORMAL LOW (ref 8.9–10.3)
Chloride: 100 mmol/L (ref 98–111)
Creatinine, Ser: 0.93 mg/dL (ref 0.44–1.00)
GFR, Estimated: >60 mL/min (ref >60)
Glucose, Bld: 88 mg/dL (ref 70–99)
Potassium: 4.2 mmol/L (ref 3.5–5.1)
Sodium: 134 mmol/L — ABNORMAL LOW (ref 135–145)

## 2022-12-03 LAB — IRON AND TIBC
Iron: 72 ug/dL (ref 28–170)
Saturation Ratios: 22 % (ref 10.4–31.8)
TIBC: 328 ug/dL (ref 250–450)
UIBC: 256 ug/dL

## 2022-12-03 LAB — VITAMIN B12: Vitamin B-12: 334 pg/mL (ref 180–914)

## 2022-12-03 LAB — PREPARE RBC (CROSSMATCH)

## 2022-12-03 LAB — HEMOGLOBIN: Hemoglobin: 8.7 g/dL — ABNORMAL LOW (ref 12.0–15.0)

## 2022-12-03 LAB — FERRITIN: Ferritin: 17 ng/mL (ref 11–307)

## 2022-12-03 LAB — LIPASE, BLOOD: Lipase: 91 U/L — ABNORMAL HIGH (ref 11–51)

## 2022-12-03 LAB — FOLATE: Folate: 27 ng/mL (ref >5.9)

## 2022-12-03 MED ORDER — PANTOPRAZOLE INFUSION (NEW) - SIMPLE MED
8.0000 mg/h | INTRAVENOUS | Status: AC
  Administered 2022-12-03 – 2022-12-04 (×3): 8 mg/h via INTRAVENOUS
  Filled 2022-12-03 (×3): qty 100

## 2022-12-03 MED ORDER — SODIUM CHLORIDE 0.9% IV SOLUTION: Freq: Once | INTRAVENOUS | Status: AC

## 2022-12-03 MED ORDER — HYDRALAZINE HCL 20 MG/ML IJ SOLN: 10.0000 mg | Freq: Three times a day (TID) | INTRAMUSCULAR | Status: DC | PRN

## 2022-12-03 MED ORDER — PANTOPRAZOLE SODIUM 40 MG IV SOLR: 40.0000 mg | Freq: Two times a day (BID) | INTRAVENOUS | Status: DC

## 2022-12-03 MED ORDER — PANTOPRAZOLE 80MG IVPB - SIMPLE MED
80.0000 mg | Freq: Once | INTRAVENOUS | Status: AC
  Administered 2022-12-03: 80 mg via INTRAVENOUS
  Filled 2022-12-03: qty 100

## 2022-12-03 MED ORDER — SODIUM CHLORIDE 0.9 % IV SOLN
50.0000 ug/h | INTRAVENOUS | Status: DC
  Administered 2022-12-03 – 2022-12-04 (×3): 50 ug/h via INTRAVENOUS
  Filled 2022-12-03 (×3): qty 1

## 2022-12-03 MED ORDER — OCTREOTIDE LOAD VIA INFUSION
50.0000 ug | Freq: Once | INTRAVENOUS | Status: AC
  Administered 2022-12-03: 50 ug via INTRAVENOUS
  Filled 2022-12-03: qty 25

## 2022-12-03 NOTE — TOC Initial Note (Signed)
Transition of Care [facility]) - Initial/Assessment Note    Patient Details  Name: Kathryn [name] MRN: [id number] Date of Birth: 1956-02-02  [facility] CM/SW Contact:    Candie Chroman, LCSW Phone Number: 12/03/2022, 12:33 PM  Clinical Narrative:  Readmission prevention screen complete. CSW met with patient. No supports at bedside. CSW introduced role and explained that discharge planning would be discussed. PCP is now at [place]. She cannot remember her name but said she thinks it is [place]. CSW went on their website and only MD listed is Farrel Gordon, MD. Sister or sister-in-law transports her to appointments. Patient uses the pharmacy at her PCP office or [place] on [address]. Patient reports that her Rosendo Gros is sometimes hard to obtain depending on which pharmacy she gets it at and it is expensive at times. Patient lives home alone. Her grandsons come to visit her as often as they can. No home health prior to admission. She has a rollator at home. TOC consult for SA resources. CSW provided. No further concerns. CSW encouraged patient to contact CSW as needed. CSW will continue to follow patient for support and facilitate return home once stable. Her sister, Vicente Males, will likely transport her home at discharge.               Expected Discharge Plan: Home/Self Care Barriers to Discharge: Continued Medical Work up   Patient Goals and CMS Choice            Expected Discharge Plan and Services     Post Acute Care Choice: NA Living arrangements for the past 2 months: Single Family Home                                      Prior Living Arrangements/Services Living arrangements for the past 2 months: Single Family Home Lives with:: Self Patient language and need for interpreter reviewed:: Yes Do you feel safe going back to the place where you live?: Yes      Need for Family Participation in Patient Care: Yes (Comment)   Current  home services: DME Criminal Activity/Legal Involvement Pertinent to Current Situation/Hospitalization: No - Comment as needed  Activities of Daily Living Home Assistive Devices/Equipment: None ADL Screening (condition at time of admission) Patient's cognitive ability adequate to safely complete daily activities?: Yes Is the patient deaf or have difficulty hearing?: No Does the patient have difficulty seeing, even when wearing glasses/contacts?: No Does the patient have difficulty concentrating, remembering, or making decisions?: No Patient able to express need for assistance with ADLs?: Yes Does the patient have difficulty dressing or bathing?: No Independently performs ADLs?: Yes (appropriate for developmental age) Does the patient have difficulty walking or climbing stairs?: Yes Weakness of Legs: Both Weakness of Arms/Hands: None  Permission Sought/Granted                  Emotional Assessment Appearance:: Appears stated age Attitude/Demeanor/Rapport: Engaged, Gracious Affect (typically observed): Accepting, Appropriate, Calm, Pleasant Orientation: : Oriented to Self, Oriented to Place, Oriented to  Time, Oriented to Situation Alcohol / Substance Use: Alcohol Use Psych Involvement: No (comment)  Admission diagnosis:  Acute pancreatitis [K85.90] Acute pancreatitis, unspecified complication status, unspecified pancreatitis type [K85.90] Patient Active Problem List   Diagnosis Date Noted   Alcohol use disorder 12/02/2022   Sarcoid 12/02/2022   Hypokalemia 01/05/2022   Hypomagnesemia 01/05/2022   Portal vein  thrombosis 01/04/2022   Physical deconditioning 01/04/2022   Cholelithiasis without cholecystitis 01/03/2022   Thrombocytopenia ([place]) 01/03/2022   Aortic atherosclerosis ([place])    Acute pancreatitis 01/02/2022   Symptomatic anemia 06/24/2021   Hypertensive urgency 06/24/2021   Asthma 06/24/2021   CKD (chronic kidney disease), stage IIIa 06/24/2021   Chronic CHF  ([place]) 06/24/2021   Pancytopenia ([place]) 06/24/2021   Community acquired pneumonia 11/20/2019   Pneumonia due to COVID-19 virus 11/20/2019   Anemia 06/12/2016   Hypoglycemia 02/11/2016   Hematemesis 04/04/2015   Cirrhosis ([place]) 04/04/2015   Esophageal varices ([place]) 04/04/2015   Hypothyroid 04/04/2015   Hypertension 04/04/2015   Chest pain 04/04/2015   PCP:  Theotis Burrow, MD Pharmacy:   [facility] [address]), [place]) [place] [address] Phone: [phone] Fax: [phone]     Social Determinants of Health ([place]) Social History: SDOH Screenings   Food Insecurity: No Food Insecurity (12/02/2022)  Housing: Low Risk  (12/02/2022)  Transportation Needs: No Transportation Needs (12/02/2022)  Utilities: Not At Risk (12/02/2022)  Tobacco Use: Low Risk  (12/02/2022)   SDOH Interventions:     Readmission Risk Interventions    12/03/2022   12:32 PM  Readmission Risk Prevention Plan  Transportation Screening Complete  PCP or Specialist Appt within 3-5 Days Complete  Social Work Consult for [place] Planning/Counseling Complete  Palliative Care Screening Not Applicable  Medication Review Press photographer) Complete

## 2022-12-03 NOTE — Care Management Important Message (Signed)
Important Message  Patient Details  Name: Kathryn [name] MRN: [id number] Date of Birth: February 29, 1956   Medicare Important Message Given:  N/A - LOS <3 / Initial given by admissions     Dannette Barbara 12/03/2022, 1:50 PM

## 2022-12-03 NOTE — Progress Notes (Signed)
[facility]  - [place] at [facility]   PATIENT NAME: Kathryn [name]    MR#:  [id number]  DATE OF BIRTH:  [birth year]  SUBJECTIVE:   Patient seen earlier. No family at bedside. Came in with some chest pain and upper abdominal pain found to have acute pancreatitis. Continues to drink on a daily basis. Noncompliance with meds and follow-up as outpatient. Lives with her grandson. Unable to tell me if she is having dark colored stools. No vomiting blood. Found to have hemoglobin of 6.2. Patient in agreement with blood transfusion    VITALS:  Blood pressure 128/75, pulse 60, temperature 98.1 F (36.7 C), resp. rate 18, height 5' 3.5" (1.613 m), weight 79.8 kg, SpO2 100 %.  PHYSICAL EXAMINATION:   GENERAL:  67 y.o.-year-old patient lying in the bed with no acute distress. Obese, pallor+ LUNGS: decreased breath sounds bilaterally, no wheezing CARDIOVASCULAR: S1, S2 normal. No murmur   ABDOMEN: Soft, nontender, nondistended. Bowel sounds present.  EXTREMITIES: No  edema b/l.    NEUROLOGIC: nonfocal  patient is alert and awake   LABORATORY PANEL:  CBC Recent Labs  Lab 12/03/22 0458  WBC 3.3*  HGB 6.2*  HCT 20.6*  PLT 92*    Chemistries  Recent Labs  Lab 12/02/22 0945 12/02/22 1424 12/03/22 0458  NA 141  --  134*  K 3.6  --  4.2  CL 104  --  100  CO2 23  --  25  GLUCOSE 79  --  88  BUN 15  --  14  CREATININE 0.93  --  0.93  CALCIUM 9.0  --  8.3*  MG  --  2.1  --   AST 92*  --   --   ALT 23  --   --   ALKPHOS 97  --   --   BILITOT 0.8  --   --    Cardiac Enzymes No results for input(s): "TROPONINI" in the last 168 hours. RADIOLOGY:  CT Angio Chest/Abd/Pel for Dissection W and/or Wo Contrast  Result Date: 12/02/2022 CLINICAL DATA:  Acute aortic syndrome suspected.  Chest pain. EXAM: CT ANGIOGRAPHY CHEST, ABDOMEN AND PELVIS TECHNIQUE: Non-contrast CT of the chest was initially obtained. Multidetector CT imaging through the chest, abdomen and pelvis was  performed using the standard protocol during bolus administration of intravenous contrast. Multiplanar reconstructed images and MIPs were obtained and reviewed to evaluate the vascular anatomy. RADIATION DOSE REDUCTION: This exam was performed according to the departmental dose-optimization program which includes automated exposure control, adjustment of the mA and/or kV according to patient size and/or use of iterative reconstruction technique. CONTRAST:  OMNIPAQUE IOHEXOL 350 MG/ML SOLN COMPARISON:  Prior CT scan of the chest, abdomen and pelvis 04/07/2022 and 02/05/2016 FINDINGS: CTA CHEST FINDINGS Cardiovascular: Initial noncontrast enhanced CT imaging of the chest demonstrates no evidence of acute intramural hematoma. Following the administration of intravenous contrast, there is excellent opacification of the thoracic aorta. Two vessel aortic arch. The right brachiocephalic and left common carotid artery share a common origin. No evidence of aneurysm or dissection. Scattered atherosclerotic plaque along the aorta and the coronary arteries. The heart is normal in size. No pericardial effusion. Excellent opacification of the pulmonary artery as well. No evidence of pulmonary embolus. Mediastinum/Nodes: Unremarkable CT appearance of the thyroid gland. No suspicious mediastinal or hilar adenopathy. No soft tissue mediastinal mass. The thoracic esophagus is unremarkable. Similar appearance of a small amount of fluid adjacent to the esophagus and extending toward the  esophageal hiatus. This likely represents trace ascites through a sliding hiatal hernia. Lungs/Pleura: No focal airspace infiltrate, pulmonary edema, pleural effusion or pneumothorax. No suspicious pulmonary mass or nodule. Isolated pulmonary cyst versus emphysematous bleb in the right lower lobe. Musculoskeletal: No chest wall abnormality. No acute or significant osseous findings. Review of the MIP images confirms the above findings. CTA ABDOMEN  AND PELVIS FINDINGS VASCULAR Aorta: Scattered atherosclerotic calcifications. No aneurysm or dissection. Celiac: Patent without evidence of aneurysm, dissection, vasculitis or significant stenosis. SMA: Patent without evidence of aneurysm, dissection, vasculitis or significant stenosis. Renals: Both renal arteries are patent without evidence of aneurysm, dissection, vasculitis, fibromuscular dysplasia or significant stenosis. IMA: Patent without evidence of aneurysm, dissection, vasculitis or significant stenosis. Inflow: Patent without evidence of aneurysm, dissection, vasculitis or significant stenosis. Veins: No focal venous abnormality. Review of the MIP images confirms the above findings. NON-VASCULAR Hepatobiliary: Heterogeneous low attenuation of the hepatic parenchyma. Diffusely nodular contour. Findings are consistent with hepatic steatosis and cirrhosis. No enhancing lesion. Stones present within the gallbladder. No intra or extrahepatic biliary ductal dilatation. Pancreas: Unremarkable. No pancreatic ductal dilatation or surrounding inflammatory changes. Spleen: Mild splenomegaly. Adrenals/Urinary Tract: Normal adrenal glands. Stable appearance of the kidneys. Simple cyst exophytic from the lower pole of the right kidney. No imaging follow-up recommended. Small low-attenuation lesions in the left kidney are too small to characterize but unchanged compared to prior imaging. Ureters and bladder are unremarkable. Stomach/Bowel: No focal bowel wall thickening. No evidence of obstruction. Normal appendix in the right lower quadrant. Lymphatic: No suspicious lymphadenopathy. Reproductive: Fibroid uterus.  No adnexal masses. Other: Mild mesenteric edema likely secondary to portal hypertension. Musculoskeletal: No acute fracture or aggressive appearing lytic or blastic osseous lesion. Hemangioma in the L5 vertebral body. Review of the MIP images confirms the above findings. IMPRESSION: 1. No evidence of aortic  dissection, aneurysm or other acute arterial abnormality. 2. No acute cardiopulmonary process. 3. No acute abnormality within the abdomen or pelvis. 4. Stable low-attenuation collection adjacent to the esophagus in the lower mediastinum and extending toward the esophageal hiatus. Findings are favored to reflect herniated fat and trace ascitic fluid along a sliding esophageal hernia. Findings are unchanged across prior studies dating back to at least 2017. 5. Hepatic cirrhosis with portal hypertension and diffuse mild mesenteric edema. 6. Cholelithiasis. 7. Additional ancillary findings as above without interval change. Electronically Signed   By: Jacqulynn Cadet M.D.   On: 12/02/2022 11:49   DG Chest 2 View  Result Date: 12/02/2022 CLINICAL DATA:  Chest pain and shortness of breath. EXAM: CHEST - 2 VIEW COMPARISON:  Chest two views 10/18/2022 and 08/13/2022 FINDINGS: Cardiac silhouette and mediastinal contours are within normal limits. The lungs are clear. No pleural effusion or pneumothorax. Moderate anterior disc space narrowing multiple upper thoracic levels. IMPRESSION: No active cardiopulmonary disease. Electronically Signed   By: Yvonne Kendall M.D.   On: 12/02/2022 09:42    Assessment and Plan Kathryn [name] is a 67 year old female with medical history of alcohol use disorder, hypothyroid, history of pancreatitis, history of cholelithiasis, history of thrombocytopenia, portal vein thrombosis with extension into SMV, on Eliquis, physical deconditioning, hypertension, history of hepatic cirrhosis with portal hypertension, who presents emergency department for chief concerns of chest pain.   CTA chest abdomen pelvis for dissection: Was read as no evidence of aortic dissection, aneurysm or acute arterial abnormality.  No acute cardiopulmonary process.  No abnormality of the abdomen or pelvis.  Hepatic cirrhosis with portal hypertension and diffuse mild  mesenteric edema.    Acute pancreatitis/Alcohol  induced - came in with lipase of 415-- 91 -- PRN IV pain meds -- NPO accepts up send meds -- IV fluids  Acute G.I. bleed history of esophageal varices status post bending and portal hypertension/PV thrombosis acute on chronic anemia in the setting of cirrhosis of liver with history of Pancytopenia --IV PPI gtt --IV Octerotide gtt --GI consult with Dr Vicente Males-- EGD will be done on Sunday. -- Patient now off PO eliquis -- hemoglobin 7.4-- 6.2-- one unit blood transfusion --cont Nadolol --Non-compliance to eliquis (per pt and pharmacy records) --iron studies WNL  Hypertension --cont nadolol --hold lasix and spironolactone will GI work w/u done --avoid hypotension   Hypothyroid - Levothyroxine   Alcohol use disorder - CIWA protocol - Extensive counseling at bedside regarding the importance of alcohol cessation given that patient has prior admission for pancreatitis diagnosis   Portal vein thrombosis - HOLD eliquis for now - CTA was negative for PE   Procedures: Family communication :sister Vicente Males on the phone 1/11 Consults :GI CODE STATUS: FULL DVT Prophylaxis :SCD Level of care: Telemetry Medical Status is: Inpatient Remains inpatient appropriate because: GI bleed, pancreatitis    TOTAL TIME TAKING CARE OF THIS PATIENT: 35 minutes.  >50% time spent on counselling and coordination of care  Note: This dictation was prepared with Dragon dictation along with smaller phrase technology. Any transcriptional errors that result from this process are unintentional.  Fritzi Mandes M.D    [facility]   CC: Primary care physician; Alene Mires, Elyse Jarvis, MD

## 2022-12-03 NOTE — Plan of Care (Signed)

## 2022-12-03 NOTE — Consult Note (Signed)
Kathryn [name] , MD [address], [address], [place], [place], [address] [address], [address], [place], [place], [address] Phone: [phone]  Fax: [phone]  Consultation  Referring Provider:   Dr. Allena [name] Primary Care Physician:  Kathryn Goodnight Presley Raddle, MD Primary Gastroenterologist:  Dr. Norma [name]         Reason for Consultation:     GI bleed  Date of Admission:  12/02/2022 Date of Consultation:  12/03/2022         HPI:   Kathryn [name] is a 67 y.o. female with history of alcoholic cirrhosis variceal bleed that required banding in the past ongoing alcohol use alcohol induced pancreatitis, portal vein thrombosis with extension into the SMV on Eliquis.  Last upper endoscopy was in 07/12/2021 where she was found to have grade 3 varices in the lower third of the esophagus 2 bands were placed.  Unfortunately did not follow-up after to complete process of banding.  Presented to the hospital with chest pain.  Last drink of alcohol was the night prior.  Baseline hemoglobin 8 months back was 9.5 g and 1 month back 8.1 g and on admission 7.4 g previously MCV was normal but has been microcytic with an MCV of 79.4.BMP normal.  On admission alcohol level was elevated. this morning hemoglobin is 6.2 g with an MCV of 78 Albumin 3.6 total bilirubin 0.8   CT angiogram chest abdomen pelvis for dissection showed no evidence of dissection hepatic cirrhosis with portal hypertension diffuse mild mesenteric edema.  She denies any hematemesis, nosebleeds, abdominal pain, melena.  Has brown bowel movements.  Continues to drink alcohol.  She wishes to stop alcohol consumption.  Denies any NSAID use.  Last dose of Eliquis was this morning. Past Medical History:  Diagnosis Date   Alcoholic cirrhosis of liver (HCC)    Asthma    CHF (congestive heart failure) (HCC)    Chronic disease anemia    Esophageal varices (HCC)    GR I on EGD by Dr Shelle Iron 09/2014   ETOH abuse    Hypertension    Hypothyroidism    Murmur     Portal hypertension (HCC)    Sarcoidosis     Past Surgical History:  Procedure Laterality Date   ESOPHAGOGASTRODUODENOSCOPY N/A 06/26/2021   Procedure: ESOPHAGOGASTRODUODENOSCOPY (EGD);  Surgeon: Toledo, Boykin Nearing, MD;  Location: [facility] ENDOSCOPY;  Service: Gastroenterology;  Laterality: N/A;   ESOPHAGOGASTRODUODENOSCOPY (EGD) WITH PROPOFOL N/A 04/04/2015   Procedure: ESOPHAGOGASTRODUODENOSCOPY (EGD) WITH PROPOFOL;  Surgeon: Midge Minium, MD;  Location: [facility] ENDOSCOPY;  Service: Endoscopy;  Laterality: N/A;  Dr Servando Snare prefers 1445   ESOPHAGOGASTRODUODENOSCOPY W/ BANDING  09/2014   Rein   TUBAL LIGATION      Prior to Admission medications   Medication Sig Start Date End Date Taking? Authorizing Provider  amLODipine (NORVASC) 10 MG tablet Take 1 tablet (10 mg total) by mouth daily. 06/28/21 12/02/22 Yes Lynn Ito, MD  apixaban (ELIQUIS) 5 MG TABS tablet Take 1 tablet (5 mg total) by mouth 2 (two) times daily. 01/10/22  Yes Lurene Shadow, MD  B Complex-C-Folic Acid (B COMPLEX-VITAMIN C-FOLIC ACID) 1 MG tablet Take 1 tablet by mouth daily with breakfast. 04/07/22  Yes Ward, Baxter Hire N, DO  ferrous sulfate 325 (65 FE) MG tablet Take 325 mg by mouth every other day.   Yes [provider]  folic acid (FOLVITE) 1 MG tablet Take 1 mg by mouth daily.   Yes [provider]  hydrALAZINE (APRESOLINE) 25 MG tablet  Take 1 tablet (25 mg total) by mouth every 8 (eight) hours. 01/07/22  Yes Jennye Boroughs, MD  levothyroxine (SYNTHROID) 175 MCG tablet Take 1 tablet (175 mcg total) by mouth daily before breakfast. 06/27/21 12/02/22 Yes Nolberto Hanlon, MD  Multiple Vitamin (MULTIVITAMIN) tablet Take 1 tablet by mouth every other day.   Yes [provider]  nadolol (CORGARD) 40 MG tablet Take 40 mg by mouth daily. 09/25/21  Yes [provider]  spironolactone (ALDACTONE) 100 MG tablet Take 100 mg by mouth daily as needed (swelling). 09/25/21  Yes [provider]  thiamine 100 MG  tablet Take 1 tablet (100 mg total) by mouth daily. 04/07/22  Yes Ward, Delice Bison, DO    Family History  Problem Relation Age of Onset   Aneurysm Father    COPD Father    Heart attack Mother      Social History   Tobacco Use   Smoking status: Never   Smokeless tobacco: Never  Vaping Use   Vaping Use: Never used  Substance Use Topics   Alcohol use: Yes    Alcohol/week: 15.0 standard drinks of alcohol    Types: 15 Shots of liquor per week   Drug use: No    Allergies as of 12/02/2022 - Review Complete 12/02/2022  Allergen Reaction Noted   Hydrocodone-acetaminophen Other (See Comments) 04/04/2015   Ibuprofen Other (See Comments) 04/04/2015   Naproxen Other (See Comments) 04/04/2015   Propoxyphene Other (See Comments) 04/04/2015   Shellfish allergy Hives 10/26/2019   Tamiflu  [oseltamivir phosphate] Other (See Comments) 04/04/2015    Review of Systems:    All systems reviewed and negative except where noted in HPI.   Physical Exam:  Vital signs in last 24 hours: Temp:  [97.8 F (36.6 C)-98.7 F (37.1 C)] 98.1 F (36.7 C) (01/11 0829) Pulse Rate:  [60-85] 60 (01/11 0829) Resp:  [15-18] 18 (01/11 0829) BP: (128-173)/(75-99) 128/75 (01/11 0829) SpO2:  [99 %-100 %] 100 % (01/11 0829) Last BM Date : 11/30/22 General:   Pleasant, cooperative in NAD Head:  Normocephalic and atraumatic. Eyes:   No icterus.   Conjunctiva pink. PERRLA. Ears:  Normal auditory acuity. Neck:  Supple; no masses or thyroidomegaly Lungs: Respirations even and unlabored. Lungs clear to auscultation bilaterally.   No wheezes, crackles, or rhonchi.  Heart:  Regular rate and rhythm;  Without murmur, clicks, rubs or gallops Abdomen:  Soft, nondistended, nontender. Normal bowel sounds. No appreciable masses or hepatomegaly.  No rebound or guarding.  Neurologic:  Alert and oriented x3;  grossly normal neurologically. Skin:  Intact without significant lesions or rashes. Cervical Nodes:  No significant  cervical adenopathy. Psych:  Alert and cooperative. Normal affect.  LAB RESULTS: Recent Labs    12/02/22 0945 12/03/22 0458  WBC 2.8* 3.3*  HGB 7.4* 6.2*  HCT 25.4* 20.6*  PLT 126* 92*   BMET Recent Labs    12/02/22 0945 12/03/22 0458  NA 141 134*  K 3.6 4.2  CL 104 100  CO2 23 25  GLUCOSE 79 88  BUN 15 14  CREATININE 0.93 0.93  CALCIUM 9.0 8.3*   LFT Recent Labs    12/02/22 0945  PROT 8.5*  ALBUMIN 3.6  AST 92*  ALT 23  ALKPHOS 97  BILITOT 0.8  BILIDIR 0.1  IBILI 0.7   PT/INR No results for input(s): "LABPROT", "INR" in the last 72 hours.  STUDIES: CT Angio Chest/Abd/Pel for Dissection W and/or Wo Contrast  Result Date: 12/02/2022 CLINICAL DATA:  Acute aortic syndrome suspected.  Chest pain. EXAM: CT ANGIOGRAPHY CHEST, ABDOMEN AND PELVIS TECHNIQUE: Non-contrast CT of the chest was initially obtained. Multidetector CT imaging through the chest, abdomen and pelvis was performed using the standard protocol during bolus administration of intravenous contrast. Multiplanar reconstructed images and MIPs were obtained and reviewed to evaluate the vascular anatomy. RADIATION DOSE REDUCTION: This exam was performed according to the departmental dose-optimization program which includes automated exposure control, adjustment of the mA and/or kV according to patient size and/or use of iterative reconstruction technique. CONTRAST:  OMNIPAQUE IOHEXOL 350 MG/ML SOLN COMPARISON:  Prior CT scan of the chest, abdomen and pelvis 04/07/2022 and 02/05/2016 FINDINGS: CTA CHEST FINDINGS Cardiovascular: Initial noncontrast enhanced CT imaging of the chest demonstrates no evidence of acute intramural hematoma. Following the administration of intravenous contrast, there is excellent opacification of the thoracic aorta. Two vessel aortic arch. The right brachiocephalic and left common carotid artery share a common origin. No evidence of aneurysm or dissection. Scattered atherosclerotic  plaque along the aorta and the coronary arteries. The heart is normal in size. No pericardial effusion. Excellent opacification of the pulmonary artery as well. No evidence of pulmonary embolus. Mediastinum/Nodes: Unremarkable CT appearance of the thyroid gland. No suspicious mediastinal or hilar adenopathy. No soft tissue mediastinal mass. The thoracic esophagus is unremarkable. Similar appearance of a small amount of fluid adjacent to the esophagus and extending toward the esophageal hiatus. This likely represents trace ascites through a sliding hiatal hernia. Lungs/Pleura: No focal airspace infiltrate, pulmonary edema, pleural effusion or pneumothorax. No suspicious pulmonary mass or nodule. Isolated pulmonary cyst versus emphysematous bleb in the right lower lobe. Musculoskeletal: No chest wall abnormality. No acute or significant osseous findings. Review of the MIP images confirms the above findings. CTA ABDOMEN AND PELVIS FINDINGS VASCULAR Aorta: Scattered atherosclerotic calcifications. No aneurysm or dissection. Celiac: Patent without evidence of aneurysm, dissection, vasculitis or significant stenosis. SMA: Patent without evidence of aneurysm, dissection, vasculitis or significant stenosis. Renals: Both renal arteries are patent without evidence of aneurysm, dissection, vasculitis, fibromuscular dysplasia or significant stenosis. IMA: Patent without evidence of aneurysm, dissection, vasculitis or significant stenosis. Inflow: Patent without evidence of aneurysm, dissection, vasculitis or significant stenosis. Veins: No focal venous abnormality. Review of the MIP images confirms the above findings. NON-VASCULAR Hepatobiliary: Heterogeneous low attenuation of the hepatic parenchyma. Diffusely nodular contour. Findings are consistent with hepatic steatosis and cirrhosis. No enhancing lesion. Stones present within the gallbladder. No intra or extrahepatic biliary ductal dilatation. Pancreas: Unremarkable. No  pancreatic ductal dilatation or surrounding inflammatory changes. Spleen: Mild splenomegaly. Adrenals/Urinary Tract: Normal adrenal glands. Stable appearance of the kidneys. Simple cyst exophytic from the lower pole of the right kidney. No imaging follow-up recommended. Small low-attenuation lesions in the left kidney are too small to characterize but unchanged compared to prior imaging. Ureters and bladder are unremarkable. Stomach/Bowel: No focal bowel wall thickening. No evidence of obstruction. Normal appendix in the right lower quadrant. Lymphatic: No suspicious lymphadenopathy. Reproductive: Fibroid uterus.  No adnexal masses. Other: Mild mesenteric edema likely secondary to portal hypertension. Musculoskeletal: No acute fracture or aggressive appearing lytic or blastic osseous lesion. Hemangioma in the L5 vertebral body. Review of the MIP images confirms the above findings. IMPRESSION: 1. No evidence of aortic dissection, aneurysm or other acute arterial abnormality. 2. No acute cardiopulmonary process. 3. No acute abnormality within the abdomen or pelvis. 4. Stable low-attenuation collection adjacent to the esophagus in the lower mediastinum and extending toward the esophageal hiatus. Findings are  favored to reflect herniated fat and trace ascitic fluid along a sliding esophageal hernia. Findings are unchanged across prior studies dating back to at least 2017. 5. Hepatic cirrhosis with portal hypertension and diffuse mild mesenteric edema. 6. Cholelithiasis. 7. Additional ancillary findings as above without interval change. Electronically Signed   By: Jacqulynn Cadet M.D.   On: 12/02/2022 11:49   DG Chest 2 View  Result Date: 12/02/2022 CLINICAL DATA:  Chest pain and shortness of breath. EXAM: CHEST - 2 VIEW COMPARISON:  Chest two views 10/18/2022 and 08/13/2022 FINDINGS: Cardiac silhouette and mediastinal contours are within normal limits. The lungs are clear. No pleural effusion or pneumothorax.  Moderate anterior disc space narrowing multiple upper thoracic levels. IMPRESSION: No active cardiopulmonary disease. Electronically Signed   By: Yvonne Kendall M.D.   On: 12/02/2022 09:42      Impression / Plan:   FEIGA NADEL is a 67 y.o. y/o female with history of alcoholic liver cirrhosis with a history of variceal bleeding status post banding in 2022 but she did not have complete eradication of varices due to known follow-up.  Portal vein thrombosis on Eliquis concern for compliance ongoing alcohol use presents to the hospital with chest pain drop in hemoglobin and baseline with new onset microcytosis no overt GI blood loss per history she has been having brown stools..  Last dose of Eliquis given this morning.  Plan 1.  Discussed with Dr. Posey Pronto hold Eliquis if possible plan to do upper endoscopy in 2 days after holding Eliquis.  2.  New onset microcytic anemia check iron studies B12 folate, if IAM levels are low will require IV iron  3.  Stop all alcohol use  4.  Antibiotics for prophylaxis in the setting of bleeding with liver cirrhosis  5.  Thiamine and the alcohol withdrawal protocol  6.  Octreotide, monitor CBC and transfuse as needed  7.  Dr. Haig Prophet be following over the weekend, will plan for upper endoscopy on Sunday   I have discussed alternative options, risks & benefits,  which include, but are not limited to, bleeding, infection, perforation,respiratory complication & drug reaction.  The patient agrees with this plan & written consent will be obtained.     Thank you for involving me in the care of this patient.      LOS: 1 day   Jonathon Bellows, MD  12/03/2022, 9:59 AM

## 2022-12-04 ENCOUNTER — Encounter: Payer: Self-pay | Admitting: Internal Medicine

## 2022-12-04 ENCOUNTER — Encounter
Admission: EM | Disposition: A | Discharge status: Home / Self Care | Payer: Self-pay | Source: Home / Self Care | Attending: Internal Medicine

## 2022-12-04 ENCOUNTER — Inpatient Hospital Stay: Payer: Medicare Other | Admitting: Anesthesiology

## 2022-12-04 DIAGNOSIS — D62 Acute posthemorrhagic anemia: Secondary | ICD-10-CM

## 2022-12-04 DIAGNOSIS — I81 Portal vein thrombosis: Secondary | ICD-10-CM | POA: Diagnosis not present

## 2022-12-04 DIAGNOSIS — K852 Alcohol induced acute pancreatitis without necrosis or infection: Secondary | ICD-10-CM | POA: Diagnosis not present

## 2022-12-04 DIAGNOSIS — D649 Anemia, unspecified: Secondary | ICD-10-CM | POA: Diagnosis not present

## 2022-12-04 DIAGNOSIS — K7031 Alcoholic cirrhosis of liver with ascites: Secondary | ICD-10-CM | POA: Diagnosis not present

## 2022-12-04 HISTORY — PX: ESOPHAGOGASTRODUODENOSCOPY (EGD) WITH PROPOFOL: SHX5813

## 2022-12-04 LAB — HIV ANTIBODY (ROUTINE TESTING W REFLEX): HIV Screen 4th Generation wRfx: NONREACTIVE

## 2022-12-04 LAB — HEMOGLOBIN AND HEMATOCRIT, BLOOD
HCT: 27.3 % — ABNORMAL LOW (ref 36.0–46.0)
Hemoglobin: 8.5 g/dL — ABNORMAL LOW (ref 12.0–15.0)

## 2022-12-04 LAB — PREPARE RBC (CROSSMATCH)

## 2022-12-04 LAB — HEMOGLOBIN: Hemoglobin: 6.5 g/dL — ABNORMAL LOW (ref 12.0–15.0)

## 2022-12-04 SURGERY — ESOPHAGOGASTRODUODENOSCOPY (EGD) WITH PROPOFOL
Anesthesia: General

## 2022-12-04 MED ORDER — SODIUM CHLORIDE 0.9% IV SOLUTION: Freq: Once | INTRAVENOUS | Status: AC

## 2022-12-04 MED ORDER — SODIUM CHLORIDE 0.9 % IV SOLN
INTRAVENOUS | Status: DC
  Administered 2022-12-04: 1000 mL via INTRAVENOUS

## 2022-12-04 MED ORDER — PROPOFOL 10 MG/ML IV BOLUS
INTRAVENOUS | Status: DC | PRN
  Administered 2022-12-04 (×2): 20 mg via INTRAVENOUS
  Administered 2022-12-04: 70 mg via INTRAVENOUS
  Administered 2022-12-04: 20 mg via INTRAVENOUS
  Administered 2022-12-04: 30 mg via INTRAVENOUS

## 2022-12-04 MED ORDER — SULFAMETHOXAZOLE-TRIMETHOPRIM 800-160 MG PO TABS
1.0000 | ORAL_TABLET | Freq: Two times a day (BID) | ORAL | Status: DC
  Administered 2022-12-04 – 2022-12-05 (×3): 1 via ORAL
  Filled 2022-12-04 (×3): qty 1

## 2022-12-04 MED ORDER — EPHEDRINE SULFATE (PRESSORS) 50 MG/ML IJ SOLN
INTRAMUSCULAR | Status: DC | PRN
  Administered 2022-12-04 (×2): 10 mg via INTRAVENOUS

## 2022-12-04 MED ORDER — DEXMEDETOMIDINE HCL IN NACL 200 MCG/50ML IV SOLN
INTRAVENOUS | Status: DC | PRN
  Administered 2022-12-04: 8 ug via INTRAVENOUS

## 2022-12-04 MED ORDER — SODIUM CHLORIDE 0.9 % IV SOLN: INTRAVENOUS | Status: DC

## 2022-12-04 MED ORDER — PANTOPRAZOLE SODIUM 40 MG PO TBEC
40.0000 mg | DELAYED_RELEASE_TABLET | Freq: Two times a day (BID) | ORAL | Status: DC
  Administered 2022-12-04 – 2022-12-05 (×2): 40 mg via ORAL
  Filled 2022-12-04 (×2): qty 1

## 2022-12-04 MED ORDER — PROPOFOL 10 MG/ML IV BOLUS
INTRAVENOUS | Status: AC
  Filled 2022-12-04: qty 20

## 2022-12-04 MED ORDER — PROTHROMBIN COMPLEX CONC HUMAN 500 UNITS IV KIT
2118.0000 [IU] | PACK | Status: AC
  Administered 2022-12-04: 2118 [IU] via INTRAVENOUS
  Filled 2022-12-04: qty 2118

## 2022-12-04 MED ORDER — SUCRALFATE 1 GM/10ML PO SUSP
1.0000 g | Freq: Three times a day (TID) | ORAL | Status: DC
  Administered 2022-12-04 – 2022-12-05 (×5): 1 g via ORAL
  Filled 2022-12-04 (×6): qty 10

## 2022-12-04 NOTE — Progress Notes (Signed)
[name] , MD [address], [place], [place], [address] [address], [place], [place], [address] Phone: [phone]  Fax: [phone]   Kathryn [name] is being followed for anemia  Day 1 of follow up   Subjective: Denies any hematemesis, melena, abdominal pain, any complaints.  Hemoglobin dropped overnight.   Objective: Vital signs in last 24 hours: Vitals:   12/03/22 2000 12/04/22 0320 12/04/22 0518 12/04/22 0546  BP: 129/79 (!) 100/55 (!) 103/55 118/69  Pulse: 65 66 64 64  Resp: 16 18 18 17   Temp: 99.4 F (37.4 C) 98.3 F (36.8 C) 98.3 F (36.8 C) 98.2 F (36.8 C)  TempSrc:   Oral Oral  SpO2: 98% 97% 98% 99%  Weight:      Height:       Weight change:   Intake/Output Summary (Last 24 hours) at 12/04/2022 02/02/2023 Last data filed at 12/04/2022 0535 Gross per 24 hour  Intake 691.26 ml  Output 1 ml  Net 690.26 ml     Exam: Heart:: Regular rate and rhythm Lungs: normal Abdomen: soft, nontender, normal bowel sounds   Lab Results: @LABTEST2 @ Micro Results: No results found for this or any previous visit (from the past 240 hour(s)). Studies/Results: CT Angio Chest/Abd/Pel for Dissection W and/or Wo Contrast  Result Date: 12/02/2022 CLINICAL DATA:  Acute aortic syndrome suspected.  Chest pain. EXAM: CT ANGIOGRAPHY CHEST, ABDOMEN AND PELVIS TECHNIQUE: Non-contrast CT of the chest was initially obtained. Multidetector CT imaging through the chest, abdomen and pelvis was performed using the standard protocol during bolus administration of intravenous contrast. Multiplanar reconstructed images and MIPs were obtained and reviewed to evaluate the vascular anatomy. RADIATION DOSE REDUCTION: This exam was performed according to the departmental dose-optimization program which includes automated exposure control, adjustment of the mA and/or kV according to patient size and/or use of iterative reconstruction technique. CONTRAST:  OMNIPAQUE IOHEXOL 350  MG/ML SOLN COMPARISON:  Prior CT scan of the chest, abdomen and pelvis 04/07/2022 and 02/05/2016 FINDINGS: CTA CHEST FINDINGS Cardiovascular: Initial noncontrast enhanced CT imaging of the chest demonstrates no evidence of acute intramural hematoma. Following the administration of intravenous contrast, there is excellent opacification of the thoracic aorta. Two vessel aortic arch. The right brachiocephalic and left common carotid artery share a common origin. No evidence of aneurysm or dissection. Scattered atherosclerotic plaque along the aorta and the coronary arteries. The heart is normal in size. No pericardial effusion. Excellent opacification of the pulmonary artery as well. No evidence of pulmonary embolus. Mediastinum/Nodes: Unremarkable CT appearance of the thyroid gland. No suspicious mediastinal or hilar adenopathy. No soft tissue mediastinal mass. The thoracic esophagus is unremarkable. Similar appearance of a small amount of fluid adjacent to the esophagus and extending toward the esophageal hiatus. This likely represents trace ascites through a sliding hiatal hernia. Lungs/Pleura: No focal airspace infiltrate, pulmonary edema, pleural effusion or pneumothorax. No suspicious pulmonary mass or nodule. Isolated pulmonary cyst versus emphysematous bleb in the right lower lobe. Musculoskeletal: No chest wall abnormality. No acute or significant osseous findings. Review of the MIP images confirms the above findings. CTA ABDOMEN AND PELVIS FINDINGS VASCULAR Aorta: Scattered atherosclerotic calcifications. No aneurysm or dissection. Celiac: Patent without evidence of aneurysm, dissection, vasculitis or significant stenosis. SMA: Patent without evidence of aneurysm, dissection, vasculitis or significant stenosis. Renals: Both renal arteries are patent without evidence of aneurysm, dissection, vasculitis, fibromuscular dysplasia or significant stenosis. IMA: Patent without evidence of aneurysm, dissection,  vasculitis or significant stenosis.  Inflow: Patent without evidence of aneurysm, dissection, vasculitis or significant stenosis. Veins: No focal venous abnormality. Review of the MIP images confirms the above findings. NON-VASCULAR Hepatobiliary: Heterogeneous low attenuation of the hepatic parenchyma. Diffusely nodular contour. Findings are consistent with hepatic steatosis and cirrhosis. No enhancing lesion. Stones present within the gallbladder. No intra or extrahepatic biliary ductal dilatation. Pancreas: Unremarkable. No pancreatic ductal dilatation or surrounding inflammatory changes. Spleen: Mild splenomegaly. Adrenals/Urinary Tract: Normal adrenal glands. Stable appearance of the kidneys. Simple cyst exophytic from the lower pole of the right kidney. No imaging follow-up recommended. Small low-attenuation lesions in the left kidney are too small to characterize but unchanged compared to prior imaging. Ureters and bladder are unremarkable. Stomach/Bowel: No focal bowel wall thickening. No evidence of obstruction. Normal appendix in the right lower quadrant. Lymphatic: No suspicious lymphadenopathy. Reproductive: Fibroid uterus.  No adnexal masses. Other: Mild mesenteric edema likely secondary to portal hypertension. Musculoskeletal: No acute fracture or aggressive appearing lytic or blastic osseous lesion. Hemangioma in the L5 vertebral body. Review of the MIP images confirms the above findings. IMPRESSION: 1. No evidence of aortic dissection, aneurysm or other acute arterial abnormality. 2. No acute cardiopulmonary process. 3. No acute abnormality within the abdomen or pelvis. 4. Stable low-attenuation collection adjacent to the esophagus in the lower mediastinum and extending toward the esophageal hiatus. Findings are favored to reflect herniated fat and trace ascitic fluid along a sliding esophageal hernia. Findings are unchanged across prior studies dating back to at least 2017. 5. Hepatic cirrhosis with  portal hypertension and diffuse mild mesenteric edema. 6. Cholelithiasis. 7. Additional ancillary findings as above without interval change. Electronically Signed   By: Jacqulynn Cadet M.D.   On: 12/02/2022 11:49   DG Chest 2 View  Result Date: 12/02/2022 CLINICAL DATA:  Chest pain and shortness of breath. EXAM: CHEST - 2 VIEW COMPARISON:  Chest two views 10/18/2022 and 08/13/2022 FINDINGS: Cardiac silhouette and mediastinal contours are within normal limits. The lungs are clear. No pleural effusion or pneumothorax. Moderate anterior disc space narrowing multiple upper thoracic levels. IMPRESSION: No active cardiopulmonary disease. Electronically Signed   By: Yvonne Kendall M.D.   On: 12/02/2022 09:42   Medications: I have reviewed the patient's current medications. Scheduled Meds:  sodium chloride   Intravenous Once   sodium chloride   Intravenous Once   B-complex with vitamin C  1 tablet Oral Daily   folic acid  1 mg Oral Daily   levothyroxine  175 mcg Oral Q0600   nadolol  40 mg Oral Daily   [START ON 12/06/2022] pantoprazole  40 mg Intravenous Q12H   thiamine  100 mg Oral Daily   Continuous Infusions:  octreotide (SANDOSTATIN) 500 mcg in sodium chloride 0.9 % 250 mL (2 mcg/mL) infusion Stopped (12/04/22 0331)   pantoprazole 8 mg/hr (12/04/22 0601)   promethazine (PHENERGAN) injection (IM or IVPB) Stopped (12/02/22 2105)   PRN Meds:.acetaminophen **OR** acetaminophen, hydrALAZINE, LORazepam **OR** LORazepam, morphine injection, nitroGLYCERIN, ondansetron **OR** ondansetron (ZOFRAN) IV, promethazine (PHENERGAN) injection (IM or IVPB), senna-docusate   Assessment: Principal Problem:   Acute pancreatitis Active Problems:   Cirrhosis (HCC)   Hypothyroid   Hypertension   Anemia   Symptomatic anemia   Portal vein thrombosis   Physical deconditioning   Alcohol use disorder   Sarcoid  Kathryn [name] is a 67 y.o. y/o female with history of alcoholic liver cirrhosis with a history of  variceal bleeding status post banding in 2022 but she did not have complete  eradication of varices due to being lost to follow-up.  Portal vein thrombosis on Eliquis concern for compliance ongoing alcohol use presents to the hospital with chest pain drop in hemoglobin and baseline with new onset microcytosis no overt GI blood loss per history she has been having brown stools..  Last dose of Eliquis given yesterday .  Had 2 units of blood transfusion and hemoglobin appropriately went up from 6.2-8.7 but this morning is dropped to 6.5.  Concern for ongoing bleeding but no overt blood loss noted.   Plan 1.  Discussed with Dr. Posey Pronto since the patient has had a significant drop in hemoglobin despite blood transfusion I feel we should perform an EGD today 1 dose of Kcentra will be given.  If EGD does not show any signs of blood loss then will require CT abdomen to rule out intra-abdominal bleed as no overt blood loss has been seen   2. Ferritin 17 suggest IV iron,   3.  Stop all alcohol use   4.  Antibiotics for prophylaxis in the setting of bleeding with liver cirrhosis   5.  Thiamine and the alcohol withdrawal protocol   6.  Octreotide, monitor CBC and transfuse as needed    I have discussed alternative options, risks & benefits,  which include, but are not limited to, bleeding, infection, perforation,respiratory complication & drug reaction.  The patient agrees with this plan & written consent will be obtained.     LOS: 2 days   Jonathon Bellows, MD 12/04/2022, 8:21 AM

## 2022-12-04 NOTE — Progress Notes (Signed)
[facility] NAME: Veta Dambrosia    MR#:  [id number]  DATE OF BIRTH:  [birth year]  SUBJECTIVE:   Patient seen earlier. No family at bedside.  No vomiting or melena No tremors  VITALS:  Blood pressure 131/83, pulse 61, temperature 98.2 F (36.8 C), resp. rate 17, height 5' 3.5" (1.613 m), weight 79.5 kg, SpO2 98 %.  PHYSICAL EXAMINATION:   GENERAL:  67 y.o.-year-old patient lying in the bed with no acute distress. Obese, pallor+ LUNGS: decreased breath sounds bilaterally, no wheezing CARDIOVASCULAR: S1, S2 normal. No murmur   ABDOMEN: Soft, nontender, nondistended. Bowel sounds present.  EXTREMITIES: No  edema b/l.    NEUROLOGIC: nonfocal  patient is alert and awake   LABORATORY PANEL:  CBC Recent Labs  Lab 12/03/22 0458 12/03/22 1928 12/04/22 0938  WBC 3.3*  --   --   HGB 6.2*   < > 8.5*  HCT 20.6*  --  27.3*  PLT 92*  --   --    < > = values in this interval not displayed.     Chemistries  Recent Labs  Lab 12/02/22 0945 12/02/22 1424 12/03/22 0458  NA 141  --  134*  K 3.6  --  4.2  CL 104  --  100  CO2 23  --  25  GLUCOSE 79  --  88  BUN 15  --  14  CREATININE 0.93  --  0.93  CALCIUM 9.0  --  8.3*  MG  --  2.1  --   AST 92*  --   --   ALT 23  --   --   ALKPHOS 97  --   --   BILITOT 0.8  --   --     Cardiac Enzymes No results for input(s): "TROPONINI" in the last 168 hours. RADIOLOGY:  No results found.  Assessment and Plan Kamalani Mastro is a 67 year old female with medical history of alcohol use disorder, hypothyroid, history of pancreatitis, history of cholelithiasis, history of thrombocytopenia, portal vein thrombosis with extension into SMV, on Eliquis, physical deconditioning, hypertension, history of hepatic cirrhosis with portal hypertension, who presents emergency department for chief concerns of chest pain.   CTA chest abdomen pelvis for dissection: Was read as no evidence of aortic dissection,  aneurysm or acute arterial abnormality.  No acute cardiopulmonary process.  No abnormality of the abdomen or pelvis.  Hepatic cirrhosis with portal hypertension and diffuse mild mesenteric edema.    Acute pancreatitis/Alcohol induced - came in with lipase of 415-- 91 -- PRN IV pain meds --start CLD -- IV fluids  Acute G.I. bleed history of esophageal varices status post bending and portal hypertension/PV thrombosis acute on chronic anemia in the setting of cirrhosis of liver with history of Pancytopenia --IV PPI gtt --IV Octerotide gtt--now d/ced --GI consult with Dr Vicente Males-- EGD will be done on Sunday. -- Patient now off PO eliquis -- hemoglobin 7.4-- 6.2-- one unit blood transfusion --cont Nadolol --Non-compliance to eliquis (per pt and pharmacy records) --iron studies WNL --IV k-centra for eliqius reversal given drop in hgb despite BT --EGD today  Hypertension --cont nadolol --hold lasix and spironolactone will GI work w/u done --avoid hypotension   Hypothyroid - Levothyroxine   Alcohol use disorder - CIWA protocol - Extensive counseling at bedside regarding the importance of alcohol cessation given that patient has prior admission for pancreatitis diagnosis   Portal vein thrombosis - HOLD eliquis for now -  CTA was negative for PE   Procedures: Family communication :unable to leave VM for sister Vicente Males Consults :GI CODE STATUS: FULL DVT Prophylaxis :SCD Level of care: Telemetry Medical Status is: Inpatient Remains inpatient appropriate because: GI bleed, pancreatitis    TOTAL TIME TAKING CARE OF THIS PATIENT: 35 minutes.  >50% time spent on counselling and coordination of care  Note: This dictation was prepared with Dragon dictation along with smaller phrase technology. Any transcriptional errors that result from this process are unintentional.  Fritzi Mandes M.D    [facility]   CC: Primary care physician; Alene Mires, Elyse Jarvis, MD

## 2022-12-04 NOTE — Anesthesia Postprocedure Evaluation (Signed)
Anesthesia Post Note  Patient: Kathryn [name]  Procedure(s) Performed: ESOPHAGOGASTRODUODENOSCOPY (EGD) WITH PROPOFOL  Patient location during evaluation: Endoscopy Anesthesia Type: General Level of consciousness: awake and alert Pain management: pain level controlled Vital Signs Assessment: post-procedure vital signs reviewed and stable Respiratory status: spontaneous breathing, nonlabored ventilation, respiratory function stable and patient connected to nasal cannula oxygen Cardiovascular status: blood pressure returned to baseline and stable Postop Assessment: no apparent nausea or vomiting Anesthetic complications: no  No notable events documented.   Last Vitals:  Vitals:   12/04/22 1148 12/04/22 1150  BP: 118/67 118/67  Pulse: 61 62  Resp: 18 20  Temp: (!) 35.8 C   SpO2: 99% 96%    Last Pain:  Vitals:   12/04/22 1148  TempSrc: Temporal  PainSc: Asleep                 Ilene Qua

## 2022-12-04 NOTE — Transfer of Care (Signed)
Immediate Anesthesia Transfer of Care Note  Patient: Tayna [place]  Procedure(s) Performed: ESOPHAGOGASTRODUODENOSCOPY (EGD) WITH PROPOFOL  Patient Location: PACU and Endoscopy Unit  Anesthesia Type:General  Level of Consciousness: drowsy and patient cooperative  Airway & Oxygen Therapy: Patient Spontanous Breathing  Post-op Assessment: Report given to RN and Post -op Vital signs reviewed and stable  Post vital signs: Reviewed and stable  Last Vitals:  Vitals Value Taken Time  BP 118/67 12/04/22 1149  Temp 35.8 C 12/04/22 1148  Pulse 61 12/04/22 1151  Resp 15 12/04/22 1151  SpO2 97 % 12/04/22 1151  Vitals shown include unvalidated device data.  Last Pain:  Vitals:   12/04/22 1148  TempSrc: Temporal  PainSc: Asleep      Patients Stated Pain Goal: 0 (86/76/72 0947)  Complications: No notable events documented.

## 2022-12-04 NOTE — Evaluation (Signed)
Physical Therapy Evaluation Patient Details Name: Kathryn [name] MRN: [id number] DOB: 01-03-1956 Today's Date: 12/04/2022  History of Present Illness  Kathryn [name] is a 61yoF who comes [facility] on 12/02/22 c central chest pain with radiation to mid back. PMH: CHF, ETOH abuse, hypoTSH, pacreatits, cholelithiasis. Pt admitted with acute pancreatitis.  Clinical Impression  Pt able to demonstrate modified independent performance of all mobility with generally good tolerance. No assistive device is used. Pt denies any acute changes to mobility, strength, balance, or perception of safety with being OOB. No acute skilled PT needs indicated at this time. PT recommends daily ambulation ad lib or with nursing staff as needed to prevent deconditioning.       Recommendations for follow up therapy are one component of a multi-disciplinary discharge planning process, led by the attending physician.  Recommendations may be updated based on patient status, additional functional criteria and insurance authorization.  Follow Up Recommendations No PT follow up      Assistance Recommended at Discharge PRN  Patient can return home with the following  Help with stairs or ramp for entrance;Assist for transportation;Assistance with cooking/housework    Equipment Recommendations None recommended by PT  Recommendations for Other Services       Functional Status Assessment Patient has not had a recent decline in their functional status     Precautions / Restrictions Precautions Precautions: Fall Restrictions Weight Bearing Restrictions: No      Mobility  Bed Mobility Overal bed mobility: Independent                  Transfers Overall transfer level: Modified independent                      Ambulation/Gait Ambulation/Gait assistance: Modified independent (Device/Increase time) Gait Distance (Feet): 280 Feet Assistive device: None   Gait velocity: 0.80m/s     General Gait Details:  some mild LE antalgia, excessive lateral sway, appears baseline  Stairs            Wheelchair Mobility    Modified Rankin (Stroke Patients Only)       Balance                                             Pertinent Vitals/Pain Pain Assessment Pain Assessment: No/denies pain    Home Living Family/patient expects to be discharged to:: Private residence Living Arrangements: Other relatives (adult grandson) Available Help at Discharge: Family;Available PRN/intermittently (sister) Type of Home: House Home Access: Level entry       Home Layout: One level Home Equipment: Conservation officer, nature (2 wheels)      Prior Function Prior Level of Function : Independent/Modified Independent             Mobility Comments: no AD at baseline ADLs Comments: independent     Hand Dominance        Extremity/Trunk Assessment                Communication      Cognition Arousal/Alertness: Awake/alert Behavior During Therapy: WFL for tasks assessed/performed Overall Cognitive Status: Within Functional Limits for tasks assessed  General Comments      Exercises     Assessment/Plan    PT Assessment Patient does not need any further PT services  PT Problem List         PT Treatment Interventions      PT Goals (Current goals can be found in the Care Plan section)  Acute Rehab PT Goals PT Goal Formulation: All assessment and education complete, DC therapy    Frequency       Co-evaluation               AM-PAC PT "6 Clicks" Mobility  Outcome Measure Help needed turning from your back to your side while in a flat bed without using bedrails?: None Help needed moving from lying on your back to sitting on the side of a flat bed without using bedrails?: None Help needed moving to and from a bed to a chair (including a wheelchair)?: None Help needed standing up from a chair using your  arms (e.g., wheelchair or bedside chair)?: None Help needed to walk in hospital room?: None Help needed climbing 3-5 steps with a railing? : A Little 6 Click Score: 23    End of Session   Activity Tolerance: Patient tolerated treatment well;No increased pain Patient left: in bed;with call bell/phone within reach Nurse Communication: Mobility status      Time: 0175-1025 PT Time Calculation (min) (ACUTE ONLY): 10 min   Charges:   PT Evaluation $PT Eval Low Complexity: 1 Low         4:29 PM, 12/04/22 [name], PT, DPT Physical Therapist - [facility]  [phone] ([place])    [name] 12/04/2022, 4:27 PM

## 2022-12-04 NOTE — Op Note (Signed)
[facility] Patient Name: Kathryn [name] Procedure Date: 12/04/2022 11:26 AM MRN: [id number] Account #: [id number] Date of Birth: 05/17/1956 Admit Type: Inpatient Age: 67 Room: [facility] ENDO ROOM 4 Gender: Female Note Status: Finalized Instrument Name: Upper Endoscope [phone] Procedure:             Upper GI endoscopy Indications:           Suspected upper gastrointestinal bleeding Providers:             Jonathon Bellows MD, MD Referring MD:          Elyse Jarvis Revelo (Referring MD) Medicines:             Monitored Anesthesia Care Complications:         No immediate complications. Procedure:             Pre-Anesthesia Assessment:                        - Prior to the procedure, a History and Physical was                         performed, and patient medications, allergies and                         sensitivities were reviewed. The patient's tolerance                         of previous anesthesia was reviewed.                        - The risks and benefits of the procedure and the                         sedation options and risks were discussed with the                         patient. All questions were answered and informed                         consent was obtained.                        - ASA Grade Assessment: II - A patient with mild                         systemic disease.                        After obtaining informed consent, the endoscope was                         passed under direct vision. Throughout the procedure,                         the patient's blood pressure, pulse, and oxygen                         saturations were monitored continuously. The Endoscope  was introduced through the mouth, and advanced to the                         third part of duodenum. The upper GI endoscopy was                         accomplished with ease. The patient tolerated the                         procedure  well. Findings:      The esophagus was normal.      The examined duodenum was normal.      Severe portal hypertensive gastropathy was found in the cardia.       Coagulation for hemostasis using argon plasma at 0.5 liters/minute and       60 watts was successful.      The cardia and gastric fundus were normal on retroflexion. Impression:            - Normal esophagus.                        - Normal examined duodenum.                        - Portal hypertensive gastropathy. Treated with argon                         plasma coagulation (APC).                        - No specimens collected. Recommendation:        - Return patient to hospital ward for ongoing care.                        - Full liquid diet.                        - Prilosec 40 mg BID for 6 weeks then 40 mg once daily                         liong term                        Carafate QID                        F/u with [facility] as an outpatient                        Monitor cbc today and if stable tomorrow can go home                        IV IRON                        No nsaids and she has to stop dribkjing alcohol                        Stop octreotide Procedure Code(s):     --- Professional ---  43255, Esophagogastroduodenoscopy, flexible,                         transoral; with control of bleeding, any method Diagnosis Code(s):     --- Professional ---                        K76.6, Portal hypertension                        K31.89, Other diseases of stomach and duodenum CPT copyright 2022 American Medical Association. All rights reserved. The codes documented in this report are preliminary and upon coder review may  be revised to meet current compliance requirements. Jonathon Bellows, MD Jonathon Bellows MD, MD 12/04/2022 11:49:01 AM This report has been signed electronically. Number of Addenda: 0 Note Initiated On: 12/04/2022 11:26 AM Estimated Blood Loss:  Estimated blood loss: none.      [facility]

## 2022-12-04 NOTE — Anesthesia Preprocedure Evaluation (Addendum)
Anesthesia Evaluation  Patient identified by MRN, date of birth, ID band Patient awake    Reviewed: Allergy & Precautions, NPO status , Patient's Chart, lab work & pertinent test results  Airway Mallampati: II  TM Distance: >3 FB Neck ROM: full    Dental  (+) Edentulous Upper, Edentulous Lower   Pulmonary asthma    Pulmonary exam normal        Cardiovascular Exercise Tolerance: Poor hypertension, On Medications +CHF  Normal cardiovascular exam+ Valvular Problems/Murmurs   IMPRESSIONS     1. Mild intracavitary gradient with no significant change with Valsalva.  Peak velocity 1.2 m/s. Peak gradient 5 mmHg. Left ventricular ejection  fraction, by estimation, is 65 to 70%. The left ventricle has normal  function. There is moderate asymmetric  left ventricular hypertrophy of the basal-septal segment. Left ventricular  diastolic parameters were normal.   2. Trivial mitral valve regurgitation.   3. The aortic valve is tricuspid. Aortic valve regurgitation is mild.   4. There is Moderate (Grade III) atheroma plaque involving the ascending  aorta.   5. There is normal pulmonary artery systolic pressure.     Neuro/Psych negative neurological ROS  negative psych ROS   GI/Hepatic negative GI ROS,,,(+) Cirrhosis  (portal HTN)      Alcoholic pancreatitis    Endo/Other  Hypothyroidism    Renal/GU Renal disease  negative genitourinary   Musculoskeletal   Abdominal   Peds  Hematology  (+) Blood dyscrasia, anemia Hx of portal vein thrombosis on Eliquis  Thrombocytopenia    Anesthesia Other Findings Past Medical History: No date: Alcoholic cirrhosis of liver (HCC) No date: Asthma No date: CHF (congestive heart failure) (HCC) No date: Chronic disease anemia No date: Esophageal varices (HCC)     Comment:  GR I on EGD by Dr Rayann Heman 09/2014 No date: ETOH abuse No date: Hypertension No date: Hypothyroidism No date:  Murmur No date: Portal hypertension ([place]) No date: Sarcoidosis  Past Surgical History: 06/26/2021: ESOPHAGOGASTRODUODENOSCOPY; N/A     Comment:  Procedure: ESOPHAGOGASTRODUODENOSCOPY (EGD);  Surgeon:               Toledo, Benay Pike, MD;  Location: [facility] ENDOSCOPY;                Service: Gastroenterology;  Laterality: N/A; 04/04/2015: ESOPHAGOGASTRODUODENOSCOPY (EGD) WITH PROPOFOL; N/A     Comment:  Procedure: ESOPHAGOGASTRODUODENOSCOPY (EGD) WITH               PROPOFOL;  Surgeon: Lucilla Lame, MD;  Location: [facility]               ENDOSCOPY;  Service: Endoscopy;  Laterality: N/A;  Dr               Allen Norris prefers 1445 09/2014: ESOPHAGOGASTRODUODENOSCOPY W/ BANDING     Comment:  Rein No date: TUBAL LIGATION  BMI    Body Mass Index: 30.69 kg/m      Reproductive/Obstetrics negative OB ROS                              Anesthesia Physical Anesthesia Plan  ASA: 4  Anesthesia Plan: General   Post-op Pain Management: Minimal or no pain anticipated   Induction: Intravenous  PONV Risk Score and Plan: Propofol infusion and TIVA  Airway Management Planned: Natural Airway and Nasal Cannula  Additional Equipment:   Intra-op Plan:   Post-operative Plan:   Informed Consent: I have reviewed the patients  History and Physical, chart, labs and discussed the procedure including the risks, benefits and alternatives for the proposed anesthesia with the patient or authorized representative who has indicated his/her understanding and acceptance.     Dental Advisory Given  Plan Discussed with: Anesthesiologist, CRNA and Surgeon  Anesthesia Plan Comments: (Patient consented for risks of anesthesia including but not limited to:  - adverse reactions to medications - risk of airway placement if required - damage to eyes, teeth, lips or other oral mucosa - nerve damage due to positioning  - sore throat or hoarseness - Damage to heart, brain, nerves, lungs, other parts of  body or loss of life  Patient voiced understanding.)        Anesthesia Quick Evaluation

## 2022-12-05 DIAGNOSIS — I81 Portal vein thrombosis: Secondary | ICD-10-CM | POA: Diagnosis not present

## 2022-12-05 DIAGNOSIS — K859 Acute pancreatitis without necrosis or infection, unspecified: Secondary | ICD-10-CM

## 2022-12-05 DIAGNOSIS — K7030 Alcoholic cirrhosis of liver without ascites: Secondary | ICD-10-CM

## 2022-12-05 DIAGNOSIS — D649 Anemia, unspecified: Secondary | ICD-10-CM | POA: Diagnosis not present

## 2022-12-05 LAB — HEMOGLOBIN: Hemoglobin: 8 g/dL — ABNORMAL LOW (ref 12.0–15.0)

## 2022-12-05 MED ORDER — HYDRALAZINE HCL 25 MG PO TABS: 25.0000 mg | ORAL_TABLET | Freq: Three times a day (TID) | ORAL | 2 refills | Status: DC

## 2022-12-05 MED ORDER — SPIRONOLACTONE 100 MG PO TABS: 100.0000 mg | ORAL_TABLET | Freq: Every day | ORAL | 0 refills | Status: DC

## 2022-12-05 MED ORDER — AMLODIPINE BESYLATE 10 MG PO TABS: 10.0000 mg | ORAL_TABLET | Freq: Every day | ORAL | 2 refills | Status: DC

## 2022-12-05 MED ORDER — PANTOPRAZOLE SODIUM 40 MG PO TBEC: 40.0000 mg | DELAYED_RELEASE_TABLET | Freq: Two times a day (BID) | ORAL | 2 refills | Status: DC

## 2022-12-05 MED ORDER — FERROUS SULFATE 325 (65 FE) MG PO TABS: 325.0000 mg | ORAL_TABLET | Freq: Every day | ORAL | 3 refills | Status: DC

## 2022-12-05 MED ORDER — NEPHRO-VITE RX 1 MG PO TABS: 1.0000 | ORAL_TABLET | Freq: Every day | ORAL | 3 refills | Status: DC

## 2022-12-05 MED ORDER — SPIRONOLACTONE 25 MG PO TABS
100.0000 mg | ORAL_TABLET | Freq: Every day | ORAL | Status: DC
  Administered 2022-12-05: 100 mg via ORAL
  Filled 2022-12-05: qty 4

## 2022-12-05 MED ORDER — NADOLOL 40 MG PO TABS: 40.0000 mg | ORAL_TABLET | Freq: Every day | ORAL | 2 refills | Status: DC

## 2022-12-05 MED ORDER — FOLIC ACID 1 MG PO TABS: 1.0000 mg | ORAL_TABLET | Freq: Every day | ORAL | 2 refills | Status: DC

## 2022-12-05 MED ORDER — SULFAMETHOXAZOLE-TRIMETHOPRIM 800-160 MG PO TABS: 1.0000 | ORAL_TABLET | Freq: Two times a day (BID) | ORAL | 0 refills | Status: AC

## 2022-12-05 MED ORDER — SUCRALFATE 1 GM/10ML PO SUSP: 1.0000 g | Freq: Three times a day (TID) | ORAL | 0 refills | Status: DC

## 2022-12-05 MED ORDER — FERROUS SULFATE 325 (65 FE) MG PO TABS
325.0000 mg | ORAL_TABLET | ORAL | Status: DC
  Administered 2022-12-05: 325 mg via ORAL
  Filled 2022-12-05: qty 1

## 2022-12-05 MED ORDER — LEVOTHYROXINE SODIUM 175 MCG PO TABS: 175.0000 ug | ORAL_TABLET | Freq: Every day | ORAL | 0 refills | Status: DC

## 2022-12-05 MED ORDER — AMLODIPINE BESYLATE 10 MG PO TABS
10.0000 mg | ORAL_TABLET | Freq: Every day | ORAL | Status: DC
  Administered 2022-12-05: 10 mg via ORAL
  Filled 2022-12-05: qty 1

## 2022-12-05 NOTE — Discharge Instructions (Signed)
Patient advised to Abstain from alcohol

## 2022-12-05 NOTE — Discharge Summary (Signed)
Physician Discharge Summary   Patient: Kathryn [name] MRN: [id number] DOB: 05-29-1956  Admit date:     12/02/2022  Discharge date: 12/05/22  Discharge Physician: Enedina Finner   PCP: Preston Fleeting, MD   Recommendations at discharge:    F/u Dr Mia Creek in 1-2 weeks F/u PCP in 1 week Abstain from drinking alcohol  Discharge Diagnoses: Principal Problem:   Acute pancreatitis Active Problems:   Cirrhosis (HCC)   Hypertension   Hypothyroid   Anemia   Symptomatic anemia   Portal vein thrombosis   Physical deconditioning   Alcohol use disorder   Sarcoid  Assessment and Plan Kathryn [name] is a 67 year old female with medical history of alcohol use disorder, hypothyroid, history of pancreatitis, history of cholelithiasis, history of thrombocytopenia, portal vein thrombosis with extension into SMV, on Eliquis, physical deconditioning, hypertension, history of hepatic cirrhosis with portal hypertension, who presents emergency department for chief concerns of chest pain.   CTA chest abdomen pelvis for dissection: Was read as no evidence of aortic dissection, aneurysm or acute arterial abnormality.  No acute cardiopulmonary process.  No abnormality of the abdomen or pelvis.  Hepatic cirrhosis with portal hypertension and diffuse mild mesenteric edema.      Acute pancreatitis/Alcohol induced - came in with lipase of 415-- 91 -- PRN IV pain meds --start CLD--now on FLD -- IV fluids   Acute G.I. bleed history of esophageal varices status post bending and portal hypertension/PV thrombosis acute on chronic anemia in the setting of cirrhosis of liver with history of Pancytopenia --IV PPI gtt --IV Octerotide gtt--now d/ced --GI consult with Dr Tobi Bastos-- EGD will be done on Sunday. -- Patient now off PO eliquis -- hemoglobin 7.4-- 6.2-- one unit blood transfusion --cont Nadolol --Non-compliance to eliquis (per pt and pharmacy records)--will HOLD for now at discharge --iron studies  WNL --IV k-centra for eliqius reversal given drop in hgb despite BT --EGD  Normal esophagus.                        - Normal examined duodenum.                        - Portal hypertensive gastropathy. Treated with argon                         plasma coagulation (APC).                        - No specimens collected.   Hypertension --resumed home meds at d/c   Hypothyroid - Levothyroxine   Alcohol use disorder - CIWA protocol - Extensive counseling at bedside regarding the importance of alcohol cessation given that patient has prior admission for pancreatitis diagnosis.   Portal vein thrombosis - HOLD eliquis for now--GI aware - CTA was negative for PE   Procedures:EGD Family communication :spoke with sister Anna Consults :GI CODE STATUS: FULL DVT Prophylaxis :SCD  Ambulated well D/c home today. D/w pt and sister--agreeable     Disposition: Home Diet recommendation:  Discharge Diet Orders (From admission, onward)     Start     Ordered   12/05/22 0000  Diet - low sodium heart healthy        01 /13/24 1033           Full liquid diet DISCHARGE MEDICATION: Allergies as of 12/05/2022       Reactions  Hydrocodone-acetaminophen Other (See Comments)   Reaction: pt can't take anything with tylenol due to her liver disease.    Ibuprofen Other (See Comments)   Esophageal varices   Naproxen Other (See Comments)   Esophageal varices   Propoxyphene Other (See Comments)   Reaction: pt isn't sure, but knows she can't take it.    Shellfish Allergy Hives   Tamiflu  [oseltamivir Phosphate] Other (See Comments)   Esophageal varices        Medication List     STOP taking these medications    apixaban 5 MG Tabs tablet Commonly known as: ELIQUIS       TAKE these medications    amLODipine 10 MG tablet Commonly known as: NORVASC Take 1 tablet (10 mg total) by mouth daily.   B complex-vitamin C-folic acid 1 MG tablet Take 1 tablet by mouth daily with  breakfast.   ferrous sulfate 325 (65 FE) MG tablet Take 1 tablet (325 mg total) by mouth daily with breakfast. What changed: when to take this   folic acid 1 MG tablet Commonly known as: FOLVITE Take 1 tablet (1 mg total) by mouth daily.   hydrALAZINE 25 MG tablet Commonly known as: APRESOLINE Take 1 tablet (25 mg total) by mouth every 8 (eight) hours.   levothyroxine 175 MCG tablet Commonly known as: SYNTHROID Take 1 tablet (175 mcg total) by mouth daily before breakfast.   multivitamin tablet Take 1 tablet by mouth every other day.   nadolol 40 MG tablet Commonly known as: CORGARD Take 1 tablet (40 mg total) by mouth daily.   pantoprazole 40 MG tablet Commonly known as: PROTONIX Take 1 tablet (40 mg total) by mouth 2 (two) times daily.   spironolactone 100 MG tablet Commonly known as: ALDACTONE Take 1 tablet (100 mg total) by mouth daily. What changed:  when to take this reasons to take this   sucralfate 1 GM/10ML suspension Commonly known as: CARAFATE Take 10 mLs (1 g total) by mouth 4 (four) times daily -  with meals and at bedtime.   sulfamethoxazole-trimethoprim 800-160 MG tablet Commonly known as: BACTRIM DS Take 1 tablet by mouth every 12 (twelve) hours for 4 days.   thiamine 100 MG tablet Commonly known as: VITAMIN B1 Take 1 tablet (100 mg total) by mouth daily.        Follow-up Information     Lesly Rubenstein, MD. Schedule an appointment as soon as possible for a visit in 1 week(s).   Specialty: Gastroenterology Why: gi bleed Contact information: [place] [address] [phone]         Revelo, Elyse Jarvis, MD. Schedule an appointment as soon as possible for a visit in 1 week(s).   Specialty: Family Medicine Why: hospital f/u Contact information: [place] [address] [phone]                 Filed Weights   12/02/22 0904 12/04/22 1112  Weight: 79.8 kg 79.5 kg      Condition at discharge: fair  The results of significant diagnostics from this hospitalization (including imaging, microbiology, ancillary and laboratory) are listed below for reference.   Imaging Studies: CT Angio Chest/Abd/Pel for Dissection W and/or Wo Contrast  Result Date: 12/02/2022 CLINICAL DATA:  Acute aortic syndrome suspected.  Chest pain. EXAM: CT ANGIOGRAPHY CHEST, ABDOMEN AND PELVIS TECHNIQUE: Non-contrast CT of the chest was initially obtained. Multidetector CT imaging through the chest, abdomen and pelvis was performed  using the standard protocol during bolus administration of intravenous contrast. Multiplanar reconstructed images and MIPs were obtained and reviewed to evaluate the vascular anatomy. RADIATION DOSE REDUCTION: This exam was performed according to the departmental dose-optimization program which includes automated exposure control, adjustment of the mA and/or kV according to patient size and/or use of iterative reconstruction technique. CONTRAST:  OMNIPAQUE IOHEXOL 350 MG/ML SOLN COMPARISON:  Prior CT scan of the chest, abdomen and pelvis 04/07/2022 and 02/05/2016 FINDINGS: CTA CHEST FINDINGS Cardiovascular: Initial noncontrast enhanced CT imaging of the chest demonstrates no evidence of acute intramural hematoma. Following the administration of intravenous contrast, there is excellent opacification of the thoracic aorta. Two vessel aortic arch. The right brachiocephalic and left common carotid artery share a common origin. No evidence of aneurysm or dissection. Scattered atherosclerotic plaque along the aorta and the coronary arteries. The heart is normal in size. No pericardial effusion. Excellent opacification of the pulmonary artery as well. No evidence of pulmonary embolus. Mediastinum/Nodes: Unremarkable CT appearance of the thyroid gland. No suspicious mediastinal or hilar adenopathy. No soft tissue mediastinal mass. The thoracic esophagus is unremarkable.  Similar appearance of a small amount of fluid adjacent to the esophagus and extending toward the esophageal hiatus. This likely represents trace ascites through a sliding hiatal hernia. Lungs/Pleura: No focal airspace infiltrate, pulmonary edema, pleural effusion or pneumothorax. No suspicious pulmonary mass or nodule. Isolated pulmonary cyst versus emphysematous bleb in the right lower lobe. Musculoskeletal: No chest wall abnormality. No acute or significant osseous findings. Review of the MIP images confirms the above findings. CTA ABDOMEN AND PELVIS FINDINGS VASCULAR Aorta: Scattered atherosclerotic calcifications. No aneurysm or dissection. Celiac: Patent without evidence of aneurysm, dissection, vasculitis or significant stenosis. SMA: Patent without evidence of aneurysm, dissection, vasculitis or significant stenosis. Renals: Both renal arteries are patent without evidence of aneurysm, dissection, vasculitis, fibromuscular dysplasia or significant stenosis. IMA: Patent without evidence of aneurysm, dissection, vasculitis or significant stenosis. Inflow: Patent without evidence of aneurysm, dissection, vasculitis or significant stenosis. Veins: No focal venous abnormality. Review of the MIP images confirms the above findings. NON-VASCULAR Hepatobiliary: Heterogeneous low attenuation of the hepatic parenchyma. Diffusely nodular contour. Findings are consistent with hepatic steatosis and cirrhosis. No enhancing lesion. Stones present within the gallbladder. No intra or extrahepatic biliary ductal dilatation. Pancreas: Unremarkable. No pancreatic ductal dilatation or surrounding inflammatory changes. Spleen: Mild splenomegaly. Adrenals/Urinary Tract: Normal adrenal glands. Stable appearance of the kidneys. Simple cyst exophytic from the lower pole of the right kidney. No imaging follow-up recommended. Small low-attenuation lesions in the left kidney are too small to characterize but unchanged compared to prior  imaging. Ureters and bladder are unremarkable. Stomach/Bowel: No focal bowel wall thickening. No evidence of obstruction. Normal appendix in the right lower quadrant. Lymphatic: No suspicious lymphadenopathy. Reproductive: Fibroid uterus.  No adnexal masses. Other: Mild mesenteric edema likely secondary to portal hypertension. Musculoskeletal: No acute fracture or aggressive appearing lytic or blastic osseous lesion. Hemangioma in the L5 vertebral body. Review of the MIP images confirms the above findings. IMPRESSION: 1. No evidence of aortic dissection, aneurysm or other acute arterial abnormality. 2. No acute cardiopulmonary process. 3. No acute abnormality within the abdomen or pelvis. 4. Stable low-attenuation collection adjacent to the esophagus in the lower mediastinum and extending toward the esophageal hiatus. Findings are favored to reflect herniated fat and trace ascitic fluid along a sliding esophageal hernia. Findings are unchanged across prior studies dating back to at least 2017. 5. Hepatic cirrhosis with portal hypertension and diffuse  mild mesenteric edema. 6. Cholelithiasis. 7. Additional ancillary findings as above without interval change. Electronically Signed   By: Malachy Moan M.D.   On: 12/02/2022 11:49   DG Chest 2 View  Result Date: 12/02/2022 CLINICAL DATA:  Chest pain and shortness of breath. EXAM: CHEST - 2 VIEW COMPARISON:  Chest two views 10/18/2022 and 08/13/2022 FINDINGS: Cardiac silhouette and mediastinal contours are within normal limits. The lungs are clear. No pleural effusion or pneumothorax. Moderate anterior disc space narrowing multiple upper thoracic levels. IMPRESSION: No active cardiopulmonary disease. Electronically Signed   By: Neita Garnet M.D.   On: 12/02/2022 09:42    Microbiology: Results for orders placed or performed during the hospital encounter of 10/18/22  Urine Culture     Status: Abnormal   Collection Time: 10/18/22  4:04 AM   Specimen: Urine,  Clean Catch  Result Value Ref Range Status   Specimen Description   Final    URINE, CLEAN CATCH Performed at [facility], [address], [place], [place] [address]    Special Requests   Final    NONE Performed at [facility], [address] [place], [place] [address]    Culture MULTIPLE SPECIES PRESENT, SUGGEST RECOLLECTION (A)  Final   Report Status 10/19/2022 FINAL  Final  Resp Panel by RT-PCR (Flu A&B, Covid) Anterior Nasal Swab     Status: None   Collection Time: 10/18/22  4:04 AM   Specimen: Anterior Nasal Swab  Result Value Ref Range Status   SARS Coronavirus 2 by RT PCR NEGATIVE NEGATIVE Final    Comment: (NOTE) SARS-CoV-2 target nucleic acids are NOT DETECTED.  The SARS-CoV-2 RNA is generally detectable in upper respiratory specimens during the acute phase of infection. The lowest concentration of SARS-CoV-2 viral copies this assay can detect is 138 copies/mL. A negative result does not preclude SARS-Cov-2 infection and should not be used as the sole basis for treatment or other patient management decisions. A negative result may occur with  improper specimen collection/handling, submission of specimen other than nasopharyngeal swab, presence of viral mutation(s) within the areas targeted by this assay, and inadequate number of viral copies(<138 copies/mL). A negative result must be combined with clinical observations, patient history, and epidemiological information. The expected result is Negative.  Fact Sheet for Patients:  BloggerCourse.com  Fact Sheet for Healthcare Providers:  SeriousBroker.it  This test is no t yet approved or cleared by the [place] FDA and  has been authorized for detection and/or diagnosis of SARS-CoV-2 by FDA under an Emergency Use Authorization (EUA). This EUA will remain  in effect (meaning this test can be used) for the duration of the COVID-19 declaration  under Section 564(b)(1) of the Act, 21 U.S.C.section 360bbb-3(b)(1), unless the authorization is terminated  or revoked sooner.       Influenza A by PCR NEGATIVE NEGATIVE Final   Influenza B by PCR NEGATIVE NEGATIVE Final    Comment: (NOTE) The Xpert Xpress SARS-CoV-2/FLU/RSV plus assay is intended as an aid in the diagnosis of influenza from Nasopharyngeal swab specimens and should not be used as a sole basis for treatment. Nasal washings and aspirates are unacceptable for Xpert Xpress SARS-CoV-2/FLU/RSV testing.  Fact Sheet for Patients: BloggerCourse.com  Fact Sheet for Healthcare Providers: SeriousBroker.it  This test is not yet approved or cleared by the [place] FDA and has been authorized for detection and/or diagnosis of SARS-CoV-2 by FDA under an Emergency Use Authorization (EUA). This EUA will remain in effect (meaning  this test can be used) for the duration of the COVID-19 declaration under Section 564(b)(1) of the Act, 21 U.S.C. section 360bbb-3(b)(1), unless the authorization is terminated or revoked.  Performed at [facility], [address] [place], [place] [address]     Labs: CBC: Recent Labs  Lab 12/02/22 0945 12/03/22 0458 12/03/22 1928 12/04/22 0252 12/04/22 0938 12/05/22 0709  WBC 2.8* 3.3*  --   --   --   --   HGB 7.4* 6.2* 8.7* 6.5* 8.5* 8.0*  HCT 25.4* 20.6*  --   --  27.3*  --   MCV 79.4* 78.3*  --   --   --   --   PLT 126* 92*  --   --   --   --    Basic Metabolic Panel: Recent Labs  Lab 12/02/22 0945 12/02/22 1424 12/03/22 0458  NA 141  --  134*  K 3.6  --  4.2  CL 104  --  100  CO2 23  --  25  GLUCOSE 79  --  88  BUN 15  --  14  CREATININE 0.93  --  0.93  CALCIUM 9.0  --  8.3*  MG  --  2.1  --    Liver Function Tests: Recent Labs  Lab 12/02/22 0945  AST 92*  ALT 23  ALKPHOS 97  BILITOT 0.8  PROT 8.5*  ALBUMIN 3.6    Discharge time spent:  30  minutes.  Signed: Fritzi Mandes, MD [facility] 12/05/2022

## 2022-12-05 NOTE — Progress Notes (Signed)
Mobility Specialist - Progress Note   12/05/22 0831  Mobility  Activity Ambulated independently in hallway;Stood at bedside;Dangled on edge of bed  Level of Assistance Independent  Assistive Device None  Distance Ambulated (ft) 180 ft  Activity Response Tolerated well  Mobility Referral Yes  $Mobility charge 1 Mobility   Pt supine in bed on RA upon arrival. Pt STS and ambulates in hallway indep. Pt returns to EOB with needs in reach.   Gretchen Short  Mobility Specialist  12/05/22 8:32 AM

## 2022-12-06 LAB — TYPE AND SCREEN
ABO/RH(D): A POS
Antibody Screen: NEGATIVE
Unit division: 0
Unit division: 0
Unit division: 0
Unit division: 0

## 2022-12-06 LAB — BPAM RBC
Blood Product Expiration Date: 202402012359
Blood Product Expiration Date: 202402112359
Blood Product Expiration Date: 202402112359
Blood Product Expiration Date: 202402142359
ISSUE DATE / TIME: 202401111415
ISSUE DATE / TIME: 202401120522
Unit Type and Rh: 6200
Unit Type and Rh: 6200
Unit Type and Rh: 6200
Unit Type and Rh: 6200

## 2022-12-06 LAB — PREPARE RBC (CROSSMATCH)

## 2022-12-07 ENCOUNTER — Encounter: Payer: Self-pay | Admitting: Gastroenterology

## 2023-04-16 ENCOUNTER — Other Ambulatory Visit: Payer: Self-pay | Admitting: Infectious Diseases

## 2023-04-16 DIAGNOSIS — Z1231 Encounter for screening mammogram for malignant neoplasm of breast: Secondary | ICD-10-CM

## 2023-05-11 ENCOUNTER — Ambulatory Visit: Payer: Medicare Other

## 2023-06-01 ENCOUNTER — Inpatient Hospital Stay: Admission: RE | Admit: 2023-06-01 | Payer: Medicare Other | Source: Ambulatory Visit

## 2023-07-27 ENCOUNTER — Other Ambulatory Visit: Payer: Self-pay

## 2023-07-27 ENCOUNTER — Emergency Department: Payer: 59

## 2023-07-27 ENCOUNTER — Observation Stay
Admission: EM | Admit: 2023-07-27 | Discharge: 2023-07-28 | Disposition: A | Discharge status: Home / Self Care | Payer: 59 | Attending: Internal Medicine | Admitting: Internal Medicine

## 2023-07-27 ENCOUNTER — Encounter: Payer: Self-pay | Admitting: Emergency Medicine

## 2023-07-27 DIAGNOSIS — F10980 Alcohol use, unspecified with alcohol-induced anxiety disorder: Secondary | ICD-10-CM | POA: Diagnosis not present

## 2023-07-27 DIAGNOSIS — I509 Heart failure, unspecified: Secondary | ICD-10-CM

## 2023-07-27 DIAGNOSIS — I130 Hypertensive heart and chronic kidney disease with heart failure and stage 1 through stage 4 chronic kidney disease, or unspecified chronic kidney disease: Secondary | ICD-10-CM | POA: Diagnosis not present

## 2023-07-27 DIAGNOSIS — I81 Portal vein thrombosis: Secondary | ICD-10-CM | POA: Diagnosis present

## 2023-07-27 DIAGNOSIS — E039 Hypothyroidism, unspecified: Secondary | ICD-10-CM | POA: Insufficient documentation

## 2023-07-27 DIAGNOSIS — N1831 Chronic kidney disease, stage 3a: Secondary | ICD-10-CM | POA: Diagnosis not present

## 2023-07-27 DIAGNOSIS — E8729 Other acidosis: Secondary | ICD-10-CM

## 2023-07-27 DIAGNOSIS — R112 Nausea with vomiting, unspecified: Secondary | ICD-10-CM | POA: Diagnosis present

## 2023-07-27 DIAGNOSIS — J45901 Unspecified asthma with (acute) exacerbation: Secondary | ICD-10-CM | POA: Insufficient documentation

## 2023-07-27 DIAGNOSIS — Z79899 Other long term (current) drug therapy: Secondary | ICD-10-CM | POA: Insufficient documentation

## 2023-07-27 DIAGNOSIS — K7460 Unspecified cirrhosis of liver: Secondary | ICD-10-CM | POA: Diagnosis present

## 2023-07-27 DIAGNOSIS — E162 Hypoglycemia, unspecified: Secondary | ICD-10-CM | POA: Diagnosis not present

## 2023-07-27 DIAGNOSIS — E8721 Acute metabolic acidosis: Secondary | ICD-10-CM | POA: Insufficient documentation

## 2023-07-27 DIAGNOSIS — K529 Noninfective gastroenteritis and colitis, unspecified: Principal | ICD-10-CM | POA: Diagnosis present

## 2023-07-27 DIAGNOSIS — F1010 Alcohol abuse, uncomplicated: Secondary | ICD-10-CM | POA: Diagnosis not present

## 2023-07-27 DIAGNOSIS — I8500 Esophageal varices without bleeding: Secondary | ICD-10-CM | POA: Diagnosis present

## 2023-07-27 DIAGNOSIS — N183 Chronic kidney disease, stage 3 unspecified: Secondary | ICD-10-CM | POA: Diagnosis present

## 2023-07-27 DIAGNOSIS — E872 Acidosis, unspecified: Secondary | ICD-10-CM

## 2023-07-27 DIAGNOSIS — Z1152 Encounter for screening for COVID-19: Secondary | ICD-10-CM | POA: Insufficient documentation

## 2023-07-27 DIAGNOSIS — I10 Essential (primary) hypertension: Secondary | ICD-10-CM | POA: Diagnosis present

## 2023-07-27 DIAGNOSIS — R109 Unspecified abdominal pain: Secondary | ICD-10-CM | POA: Diagnosis present

## 2023-07-27 DIAGNOSIS — F1090 Alcohol use, unspecified, uncomplicated: Secondary | ICD-10-CM | POA: Diagnosis present

## 2023-07-27 LAB — BASIC METABOLIC PANEL
Anion gap: 21 — ABNORMAL HIGH (ref 5–15)
Anion gap: 20 — ABNORMAL HIGH (ref 5–15)
BUN: 29 mg/dL — ABNORMAL HIGH (ref 8–23)
BUN: 24 mg/dL — ABNORMAL HIGH (ref 8–23)
CO2: 13 mmol/L — ABNORMAL LOW (ref 22–32)
CO2: 14 mmol/L — ABNORMAL LOW (ref 22–32)
Calcium: 8.6 mg/dL — ABNORMAL LOW (ref 8.9–10.3)
Calcium: 8.3 mg/dL — ABNORMAL LOW (ref 8.9–10.3)
Chloride: 101 mmol/L (ref 98–111)
Chloride: 100 mmol/L (ref 98–111)
Creatinine, Ser: 1.14 mg/dL — ABNORMAL HIGH (ref 0.44–1.00)
Creatinine, Ser: 1.03 mg/dL — ABNORMAL HIGH (ref 0.44–1.00)
GFR, Estimated: 53 mL/min — ABNORMAL LOW (ref >60)
GFR, Estimated: 60 mL/min — ABNORMAL LOW (ref >60)
Glucose, Bld: 62 mg/dL — ABNORMAL LOW (ref 70–99)
Glucose, Bld: 94 mg/dL (ref 70–99)
Potassium: 4.2 mmol/L (ref 3.5–5.1)
Potassium: 4.7 mmol/L (ref 3.5–5.1)
Sodium: 135 mmol/L (ref 135–145)
Sodium: 134 mmol/L — ABNORMAL LOW (ref 135–145)

## 2023-07-27 LAB — LIPASE, BLOOD: Lipase: 30 U/L (ref 11–51)

## 2023-07-27 LAB — CBC
HCT: 36.5 % (ref 36.0–46.0)
Hemoglobin: 10.6 g/dL — ABNORMAL LOW (ref 12.0–15.0)
MCH: 24.7 pg — ABNORMAL LOW (ref 26.0–34.0)
MCHC: 29 g/dL — ABNORMAL LOW (ref 30.0–36.0)
MCV: 84.9 fL (ref 80.0–100.0)
Platelets: 253 10*3/uL (ref 150–400)
RBC: 4.3 MIL/uL (ref 3.87–5.11)
RDW: 23.5 % — ABNORMAL HIGH (ref 11.5–15.5)
WBC: 4.6 10*3/uL (ref 4.0–10.5)
nRBC: 0.9 % — ABNORMAL HIGH (ref 0.0–0.2)

## 2023-07-27 LAB — URINALYSIS, ROUTINE W REFLEX MICROSCOPIC
Bilirubin Urine: NEGATIVE
Glucose, UA: NEGATIVE mg/dL
Ketones, ur: 80 mg/dL — AB
Leukocytes,Ua: NEGATIVE
Nitrite: NEGATIVE
Protein, ur: 30 mg/dL — AB
Specific Gravity, Urine: 1.041 — ABNORMAL HIGH (ref 1.005–1.030)
pH: 5 (ref 5.0–8.0)

## 2023-07-27 LAB — LACTIC ACID, PLASMA
Lactic Acid, Venous: 2.3 mmol/L (ref 0.5–1.9)
Lactic Acid, Venous: 1.6 mmol/L (ref 0.5–1.9)

## 2023-07-27 LAB — BLOOD GAS, VENOUS
Acid-base deficit: 14.2 mmol/L — ABNORMAL HIGH (ref 0.0–2.0)
Bicarbonate: 12.4 mmol/L — ABNORMAL LOW (ref 20.0–28.0)
O2 Saturation: 92.5 %
Patient temperature: 37
pCO2, Ven: 31 mmHg — ABNORMAL LOW (ref 44–60)
pH, Ven: 7.21 — ABNORMAL LOW (ref 7.25–7.43)
pO2, Ven: 68 mmHg — ABNORMAL HIGH (ref 32–45)

## 2023-07-27 LAB — TROPONIN I (HIGH SENSITIVITY)
Troponin I (High Sensitivity): 17 ng/L (ref <18)
Troponin I (High Sensitivity): 13 ng/L (ref <18)

## 2023-07-27 LAB — RESP PANEL BY RT-PCR (RSV, FLU A&B, COVID)  RVPGX2
Influenza A by PCR: NEGATIVE
Influenza B by PCR: NEGATIVE
Resp Syncytial Virus by PCR: NEGATIVE
SARS Coronavirus 2 by RT PCR: NEGATIVE

## 2023-07-27 LAB — HEMOGLOBIN A1C
Hgb A1c MFr Bld: 5.6 % (ref 4.8–5.6)
Mean Plasma Glucose: 114.02 mg/dL

## 2023-07-27 LAB — CBG MONITORING, ED
Glucose-Capillary: 62 mg/dL — ABNORMAL LOW (ref 70–99)
Glucose-Capillary: 115 mg/dL — ABNORMAL HIGH (ref 70–99)

## 2023-07-27 LAB — GLUCOSE, CAPILLARY: Glucose-Capillary: 97 mg/dL (ref 70–99)

## 2023-07-27 MED ORDER — THIAMINE MONONITRATE 100 MG PO TABS
100.0000 mg | ORAL_TABLET | Freq: Every day | ORAL | Status: DC
  Administered 2023-07-27 – 2023-07-28 (×2): 100 mg via ORAL
  Filled 2023-07-27 (×2): qty 1

## 2023-07-27 MED ORDER — PANTOPRAZOLE SODIUM 40 MG IV SOLR: 40.0000 mg | Freq: Two times a day (BID) | INTRAVENOUS | Status: DC

## 2023-07-27 MED ORDER — SPIRONOLACTONE 25 MG PO TABS
50.0000 mg | ORAL_TABLET | Freq: Every day | ORAL | Status: DC
  Administered 2023-07-28: 50 mg via ORAL
  Filled 2023-07-27: qty 2

## 2023-07-27 MED ORDER — MORPHINE SULFATE (PF) 4 MG/ML IV SOLN
4.0000 mg | Freq: Once | INTRAVENOUS | Status: AC
  Administered 2023-07-27: 4 mg via INTRAVENOUS
  Filled 2023-07-27: qty 1

## 2023-07-27 MED ORDER — ONDANSETRON HCL 4 MG/2ML IJ SOLN
4.0000 mg | Freq: Four times a day (QID) | INTRAMUSCULAR | Status: DC | PRN
  Administered 2023-07-27: 4 mg via INTRAVENOUS
  Filled 2023-07-27: qty 2

## 2023-07-27 MED ORDER — ENOXAPARIN SODIUM 40 MG/0.4ML IJ SOSY
40.0000 mg | PREFILLED_SYRINGE | INTRAMUSCULAR | Status: DC
  Administered 2023-07-27: 40 mg via SUBCUTANEOUS
  Filled 2023-07-27: qty 0.4

## 2023-07-27 MED ORDER — INSULIN ASPART 100 UNIT/ML IJ SOLN: 0.0000 [IU] | INTRAMUSCULAR | Status: DC

## 2023-07-27 MED ORDER — LACTATED RINGERS IV BOLUS
1000.0000 mL | Freq: Once | INTRAVENOUS | Status: AC
  Administered 2023-07-27: 1000 mL via INTRAVENOUS

## 2023-07-27 MED ORDER — LORAZEPAM 1 MG PO TABS: 1.0000 mg | ORAL_TABLET | ORAL | Status: DC | PRN

## 2023-07-27 MED ORDER — PANTOPRAZOLE SODIUM 40 MG PO TBEC
40.0000 mg | DELAYED_RELEASE_TABLET | Freq: Two times a day (BID) | ORAL | Status: DC
  Administered 2023-07-27 – 2023-07-28 (×2): 40 mg via ORAL
  Filled 2023-07-27 (×2): qty 1

## 2023-07-27 MED ORDER — DEXTROSE 5 % AND 0.9 % NACL IV BOLUS
500.0000 mL | Freq: Once | INTRAVENOUS | Status: DC
  Filled 2023-07-27 (×2): qty 500

## 2023-07-27 MED ORDER — OCTREOTIDE LOAD VIA INFUSION
50.0000 ug | Freq: Once | INTRAVENOUS | Status: DC
  Filled 2023-07-27: qty 25

## 2023-07-27 MED ORDER — FOLIC ACID 1 MG PO TABS
1.0000 mg | ORAL_TABLET | Freq: Every day | ORAL | Status: DC
  Administered 2023-07-27 – 2023-07-28 (×2): 1 mg via ORAL
  Filled 2023-07-27 (×2): qty 1

## 2023-07-27 MED ORDER — LEVOTHYROXINE SODIUM 50 MCG PO TABS
125.0000 ug | ORAL_TABLET | Freq: Every day | ORAL | Status: DC
  Administered 2023-07-28: 125 ug via ORAL
  Filled 2023-07-27: qty 1

## 2023-07-27 MED ORDER — THIAMINE HCL 100 MG/ML IJ SOLN
100.0000 mg | Freq: Every day | INTRAMUSCULAR | Status: DC
  Filled 2023-07-27: qty 2

## 2023-07-27 MED ORDER — SODIUM CHLORIDE 0.9 % IV SOLN
50.0000 ug/h | INTRAVENOUS | Status: DC
  Filled 2023-07-27 (×2): qty 1

## 2023-07-27 MED ORDER — ONDANSETRON HCL 4 MG PO TABS: 4.0000 mg | ORAL_TABLET | Freq: Four times a day (QID) | ORAL | Status: DC | PRN

## 2023-07-27 MED ORDER — LORAZEPAM 2 MG/ML IJ SOLN: 1.0000 mg | INTRAMUSCULAR | Status: DC | PRN

## 2023-07-27 MED ORDER — DEXTROSE 5 % AND 0.9 % NACL IV BOLUS
500.0000 mL | Freq: Once | INTRAVENOUS | Status: AC
  Administered 2023-07-27: 500 mL via INTRAVENOUS
  Filled 2023-07-27: qty 1000

## 2023-07-27 MED ORDER — ADULT MULTIVITAMIN W/MINERALS CH
1.0000 | ORAL_TABLET | Freq: Every day | ORAL | Status: DC
  Administered 2023-07-27 – 2023-07-28 (×2): 1 via ORAL
  Filled 2023-07-27 (×2): qty 1

## 2023-07-27 MED ORDER — NADOLOL 40 MG PO TABS
40.0000 mg | ORAL_TABLET | Freq: Every day | ORAL | Status: DC
  Filled 2023-07-27: qty 1

## 2023-07-27 MED ORDER — ONDANSETRON HCL 4 MG/2ML IJ SOLN
4.0000 mg | Freq: Once | INTRAMUSCULAR | Status: AC
  Administered 2023-07-27: 4 mg via INTRAVENOUS
  Filled 2023-07-27: qty 2

## 2023-07-27 MED ORDER — FENTANYL CITRATE PF 50 MCG/ML IJ SOSY
50.0000 ug | PREFILLED_SYRINGE | Freq: Once | INTRAMUSCULAR | Status: DC
  Filled 2023-07-27: qty 1

## 2023-07-27 MED ORDER — SUCRALFATE 1 GM/10ML PO SUSP: 1.0000 g | Freq: Three times a day (TID) | ORAL | Status: DC

## 2023-07-27 MED ORDER — IOHEXOL 300 MG/ML  SOLN
100.0000 mL | Freq: Once | INTRAMUSCULAR | Status: AC | PRN
  Administered 2023-07-27: 100 mL via INTRAVENOUS

## 2023-07-27 MED ORDER — DEXTROSE-SODIUM CHLORIDE 5-0.45 % IV SOLN: INTRAVENOUS | Status: DC

## 2023-07-27 MED ORDER — PROCHLORPERAZINE EDISYLATE 10 MG/2ML IJ SOLN
5.0000 mg | Freq: Once | INTRAMUSCULAR | Status: AC
  Administered 2023-07-27: 5 mg via INTRAVENOUS
  Filled 2023-07-27: qty 2

## 2023-07-27 MED ORDER — DEXTROSE IN LACTATED RINGERS 5 % IV SOLN: INTRAVENOUS | Status: DC

## 2023-07-27 NOTE — ED Notes (Signed)
Pt reports she has missed last two days of hydralazine d/t needing a refill and recently changing insurance cards.

## 2023-07-27 NOTE — Assessment & Plan Note (Signed)
Blood sugar in 60s in the setting of colitis and nausea vomiting Blood sugar now into the 100s Hypoglycemia protocol Monitor

## 2023-07-27 NOTE — Assessment & Plan Note (Signed)
 BP stable Titrate regimen

## 2023-07-27 NOTE — ED Triage Notes (Signed)
Patient to ED via [facility] for mid-sternal chest pain. Pain worse when laying down- started yesterday. Also having lower back pain and lower abd pain. States she took tramadol and omparozole for pain with no relief.

## 2023-07-27 NOTE — Progress Notes (Signed)
IVT consult placed for 2nd access due to incompatible infusions. Consult placed prior to infusions being modified. Spoke with primary RN who is happy with current IV access.  No additional access needed at this time. Please re-consult should need change.

## 2023-07-27 NOTE — ED Provider Notes (Signed)
[facility] Provider Note    Event Date/Time   First MD Initiated Contact with Patient 07/27/23 1142     (approximate)   History   Chest Pain   HPI Kathryn [name] is a 67 y.o. female with HTN, HLD, CHF, cirrhosis, esophageal varices, CKD stage III presenting today with chest pain and abdominal pain.  Patient states symptom onset yesterday evening with generalized abdominal pain most prominent in her lower abdomen.  She notices generalized bodyaches that also involve her upper back and shoulders.  She had some brief chest pain when lying flat this morning but resolved shortly and now only has pain around her back.  Also notes chills, intermittent shortness of breath, nausea.  Denies dysuria, diarrhea, blood in stools or vomit.     Physical Exam   Triage Vital Signs: ED Triage Vitals  Encounter Vitals Group     BP 07/27/23 1104 (!) 180/85     Systolic BP Percentile --      Diastolic BP Percentile --      Pulse Rate 07/27/23 1104 85     Resp 07/27/23 1104 18     Temp 07/27/23 1104 98.2 F (36.8 C)     Temp src --      SpO2 07/27/23 1104 97 %     Weight 07/27/23 1105 175 lb (79.4 kg)     Height 07/27/23 1105 5\' 4"  (1.626 m)     Head Circumference --      Peak Flow --      Pain Score 07/27/23 1115 10     Pain Loc --      Pain Education --      Exclude from Growth Chart --     Most recent vital signs: Vitals:   07/27/23 1430 07/27/23 1600  BP: (!) 152/72 (!) 155/63  Pulse: 68 63  Resp: 19 17  Temp:    SpO2: 97% 92%   Physical Exam: I have reviewed the vital signs and nursing notes. General: Awake, alert, no acute distress.  Nontoxic appearing. Head:  Atraumatic, normocephalic.   ENT:  EOM intact, PERRL. Oral mucosa is pink and moist with no lesions. Neck: Neck is supple with full range of motion, No meningeal signs. Cardiovascular:  RRR, No murmurs. Peripheral pulses palpable and equal bilaterally. Respiratory:  Symmetrical chest wall  expansion.  No rhonchi, rales, or wheezes.  Good air movement throughout.  No use of accessory muscles.   Musculoskeletal:  No cyanosis or edema. Moving extremities with full ROM.  Generalized soreness palpation throughout paraspinal muscles as well as shoulder muscles.  No neck soreness or tenderness or pain with range of motion. Abdomen:  Soft, tenderness palpation throughout abdomen but most prominent in bilateral lower quadrants, nondistended. Neuro:  GCS 15, moving all four extremities, interacting appropriately. Speech clear. Psych:  Calm, appropriate.   Skin:  Warm, dry, no rash.    ED Results / Procedures / Treatments   Labs (all labs ordered are listed, but only abnormal results are displayed) Labs Reviewed  BASIC METABOLIC PANEL - Abnormal; Notable for the following components:      Result Value   CO2 13 (*)    Glucose, Bld 62 (*)    BUN 29 (*)    Creatinine, Ser 1.14 (*)    Calcium 8.6 (*)    GFR, Estimated 53 (*)    Anion gap 21 (*)    All other components within normal limits  CBC - Abnormal; Notable  for the following components:   Hemoglobin 10.6 (*)    MCH 24.7 (*)    MCHC 29.0 (*)    RDW 23.5 (*)    nRBC 0.9 (*)    All other components within normal limits  URINALYSIS, ROUTINE W REFLEX MICROSCOPIC - Abnormal; Notable for the following components:   Color, Urine YELLOW (*)    APPearance CLEAR (*)    Specific Gravity, Urine 1.041 (*)    Hgb urine dipstick SMALL (*)    Ketones, ur 80 (*)    Protein, ur 30 (*)    Bacteria, UA RARE (*)    All other components within normal limits  LACTIC ACID, PLASMA - Abnormal; Notable for the following components:   Lactic Acid, Venous 2.3 (*)    All other components within normal limits  BLOOD GAS, VENOUS - Abnormal; Notable for the following components:   pH, Ven 7.21 (*)    pCO2, Ven 31 (*)    pO2, Ven 68 (*)    Bicarbonate 12.4 (*)    Acid-base deficit 14.2 (*)    All other components within normal limits  CBG  MONITORING, ED - Abnormal; Notable for the following components:   Glucose-Capillary 62 (*)    All other components within normal limits  CBG MONITORING, ED - Abnormal; Notable for the following components:   Glucose-Capillary 115 (*)    All other components within normal limits  RESP PANEL BY RT-PCR (RSV, FLU A&B, COVID)  RVPGX2  GASTROINTESTINAL PANEL BY PCR, STOOL (REPLACES STOOL CULTURE)  C DIFFICILE QUICK SCREEN W PCR REFLEX    LIPASE, BLOOD  LACTIC ACID, PLASMA  HEMOGLOBIN A1C  CBC  COMPREHENSIVE METABOLIC PANEL  TROPONIN I (HIGH SENSITIVITY)  TROPONIN I (HIGH SENSITIVITY)     EKG My EKG interpretation: Rate of 79, normal sinus rhythm.  No acute ST elevations or depressions  RADIOLOGY Independently interpreted chest x-ray with no acute pathology.  Independently interpreted CT abdomen/pelvis with some concern for colitis as verified by radiologist.   PROCEDURES:  Critical Care performed: No  Procedures   MEDICATIONS ORDERED IN ED: Medications  fentaNYL (SUBLIMAZE) injection 50 mcg (50 mcg Intravenous Patient Refused/Not Given 07/27/23 1420)  enoxaparin (LOVENOX) injection 40 mg (has no administration in time range)  ondansetron (ZOFRAN) tablet 4 mg ( Oral See Alternative 07/27/23 1639)    Or  ondansetron (ZOFRAN) injection 4 mg (4 mg Intravenous Given 07/27/23 1639)  insulin aspart (novoLOG) injection 0-9 Units ( Subcutaneous Not Given 07/27/23 1620)  spironolactone (ALDACTONE) tablet 100 mg (has no administration in time range)  sucralfate (CARAFATE) 1 GM/10ML suspension 1 g (has no administration in time range)  nadolol (CORGARD) tablet 40 mg (has no administration in time range)  levothyroxine (SYNTHROID) tablet 175 mcg (has no administration in time range)  pantoprazole (PROTONIX) injection 40 mg (has no administration in time range)  octreotide (SANDOSTATIN) 2 mcg/mL load via infusion 50 mcg (has no administration in time range)    And  octreotide (SANDOSTATIN) 500  mcg in sodium chloride 0.9 % 250 mL (2 mcg/mL) infusion (has no administration in time range)  dextrose 5 % in lactated ringers infusion (has no administration in time range)  LORazepam (ATIVAN) tablet 1-4 mg (has no administration in time range)    Or  LORazepam (ATIVAN) injection 1-4 mg (has no administration in time range)  thiamine (VITAMIN B1) tablet 100 mg (has no administration in time range)    Or  thiamine (VITAMIN B1) injection 100 mg (has  no administration in time range)  folic acid (FOLVITE) tablet 1 mg (has no administration in time range)  multivitamin with minerals tablet 1 tablet (has no administration in time range)  ondansetron (ZOFRAN) injection 4 mg (4 mg Intravenous Given 07/27/23 1226)  lactated ringers bolus 1,000 mL (0 mLs Intravenous Stopped 07/27/23 1622)  morphine (PF) 4 MG/ML injection 4 mg (4 mg Intravenous Given 07/27/23 1225)  iohexol (OMNIPAQUE) 300 MG/ML solution 100 mL (100 mLs Intravenous Contrast Given 07/27/23 1252)  dextrose 5 % and 0.9% NaCl 5-0.9 % bolus 500 mL (0 mLs Intravenous Stopped 07/27/23 1642)  morphine (PF) 4 MG/ML injection 4 mg (4 mg Intravenous Given 07/27/23 1449)  prochlorperazine (COMPAZINE) injection 5 mg (5 mg Intravenous Given 07/27/23 1449)  lactated ringers bolus 1,000 mL (1,000 mLs Intravenous New Bag/Given 07/27/23 1642)     IMPRESSION / MDM / ASSESSMENT AND PLAN / ED COURSE  I reviewed the triage vital signs and the nursing notes.                              Differential diagnosis includes, but is not limited to, colitis, pancreatitis, cholecystitis, viral gastroenteritis, viral URI such as COVID, flu, RSV, appendicitis, diverticulitis.  Patient's presentation is most consistent with acute presentation with potential threat to life or bodily function.  Patient is a 67 year old female presenting today for vomiting, diarrhea, generalized abdominal pain.  Vital signs are stable on arrival and patient was given 1 L fluids, pain medicine,  nausea medicine.  Laboratory workup notable for bicarb at 13 with elevated anion gap.  No elevation in glucose and low suspicion for DKA with no other prior diabetes history.  Suspecting starvation ketosis as the source of acidosis at this time.  CT abdomen/pelvis showed concern for colitis which would be consistent with patient's abdominal pain symptoms.  Given ongoing pain requiring redosing of pain and nausea medicine as well as profound acidosis, will admit to hospitalist for further care.  The patient is on the cardiac monitor to evaluate for evidence of arrhythmia and/or significant heart rate changes. Clinical Course as of 07/27/23 1753  Tue Jul 27, 2023  1245 DG Chest 2 View No acute pathology per my interpretation [DW]  1321 Glucose-Capillary(!): 62 Given oral glucose repletion.  Will follow this up with D5 NS maintenance fluid while continuing to evaluate [DW]  1427 CT ABDOMEN PELVIS W CONTRAST The bowel is nondilated with a normal caliber appendix. There is some areas of wall thickening along the right side of the colon and rectum with slight stranding. Please correlate for a colitis versus colopathy in the setting of chronic liver disease. There is also some fold thickening along the stomach. [DW]  1440 CO2(!): 13 [DW]  1440 Anion gap(!): 21 [DW]    Clinical Course User Index [DW] Janith Lima, MD     FINAL CLINICAL IMPRESSION(S) / ED DIAGNOSES   Final diagnoses:  Colitis  Nausea vomiting and diarrhea  Metabolic acidosis     Rx / DC Orders   ED Discharge Orders     None        Note:  This document was prepared using Dragon voice recognition software and may include unintentional dictation errors.   Janith Lima, MD 07/27/23 [id number]

## 2023-07-27 NOTE — Assessment & Plan Note (Signed)
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

## 2023-07-27 NOTE — Assessment & Plan Note (Signed)
2D ECHO 12/2021 w/ EF 60-65%  Mildly dry  IVF hydration  Follow closely

## 2023-07-27 NOTE — H&P (Addendum)
History and Physical    Patient: Kathryn [name]:[id number] DOB: August 16, 1956 DOA: 07/27/2023 DOS: the patient was seen and examined on 07/27/2023 PCP: Norval Morton, MD  Patient coming from: Home  Chief Complaint:  Chief Complaint  Patient presents with   Chest Pain   HPI: Kathryn [name] is a 67 y.o. 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  Patient reports sudden onset of generalized abdominal pain, nausea, decreased p.o. intake over the past 1 to 2 days.  No reported sick contacts.  No fevers or chills.  Abdominal pain has been generalized.  No overt diarrhea.  Positive vomiting today.  Based on alcohol abuse.  Still drinking multiple shots of liquor daily.  No chest pain or shortness of breath.  No hemiparesis or confusion. Presented to the ER afebrile, hemodynamically stable.  White count 4.6, hemoglobin 10.6, platelets 253, lactate 2.3, VBG with metabolic acidosis.  Glucose 62-1 15.  CT abdomen pelvis with noted chronic liver disease with varices and mesenteric stranding no ascites.  Positive colitis. Review of Systems: As mentioned in the history of present illness. All other systems reviewed and are negative. Past Medical History:  Diagnosis Date   Alcoholic cirrhosis of liver (HCC)    Asthma    CHF (congestive heart failure) (HCC)    Chronic disease anemia    Esophageal varices (HCC)    GR I on EGD by Dr Shelle Iron 09/2014   ETOH abuse    Hypertension    Hypothyroidism    Murmur    Portal hypertension (HCC)    Sarcoidosis    Past Surgical History:  Procedure Laterality Date   ESOPHAGOGASTRODUODENOSCOPY N/A 06/26/2021   Procedure: ESOPHAGOGASTRODUODENOSCOPY (EGD);  Surgeon: Toledo, Boykin Nearing, MD;  Location: [facility] ENDOSCOPY;  Service: Gastroenterology;   Laterality: N/A;   ESOPHAGOGASTRODUODENOSCOPY (EGD) WITH PROPOFOL N/A 04/04/2015   Procedure: ESOPHAGOGASTRODUODENOSCOPY (EGD) WITH PROPOFOL;  Surgeon: Midge Minium, MD;  Location: [facility] ENDOSCOPY;  Service: Endoscopy;  Laterality: N/A;  Dr Servando Snare prefers 1445   ESOPHAGOGASTRODUODENOSCOPY (EGD) WITH PROPOFOL N/A 12/04/2022   Procedure: ESOPHAGOGASTRODUODENOSCOPY (EGD) WITH PROPOFOL;  Surgeon: [name], MD;  Location: [facility] ENDOSCOPY;  Service: Gastroenterology;  Laterality: N/A;   ESOPHAGOGASTRODUODENOSCOPY W/ BANDING  09/2014   [place]   TUBAL LIGATION     Social History:  reports that she has never smoked. She has never used smokeless tobacco. She reports current alcohol use of about 15.0 standard drinks of alcohol per week. She reports that she does not use drugs.  Allergies  Allergen Reactions   Hydrocodone-Acetaminophen Other (See Comments)    Reaction: pt can't take anything with tylenol due to her liver disease.    Ibuprofen Other (See Comments)    Esophageal varices   Naproxen Other (See Comments)    Esophageal varices   Propoxyphene Other (See Comments)    Reaction: pt isn't sure, but knows she can't take it.    Shellfish Allergy Hives   Tamiflu  [Oseltamivir Phosphate] Other (See Comments)    Esophageal varices    Family History  Problem Relation Age of Onset   Aneurysm Father    COPD Father    Heart attack Mother     Prior to Admission medications   Medication Sig Start Date End Date Taking? Authorizing Provider  amLODipine (NORVASC) 10 MG tablet Take 1 tablet (10 mg total) by mouth  daily. 12/05/22 03/05/23  Enedina Finner, MD  B Complex-C-Folic Acid (B COMPLEX-VITAMIN C-FOLIC ACID) 1 MG tablet Take 1 tablet by mouth daily with breakfast. 12/05/22   Enedina Finner, MD  ferrous sulfate 325 (65 FE) MG tablet Take 1 tablet (325 mg total) by mouth daily with breakfast. 12/05/22   Enedina Finner, MD  folic acid (FOLVITE) 1 MG tablet Take 1 tablet (1 mg total) by mouth daily. 12/05/22   Enedina Finner, MD  hydrALAZINE (APRESOLINE) 25 MG tablet Take 1 tablet (25 mg total) by mouth every 8 (eight) hours. 12/05/22   Enedina Finner, MD  levothyroxine (SYNTHROID) 175 MCG tablet Take 1 tablet (175 mcg total) by mouth daily before breakfast. 12/05/22 01/04/23  Enedina Finner, MD  Multiple Vitamin (MULTIVITAMIN) tablet Take 1 tablet by mouth every other day.    [provider]  nadolol (CORGARD) 40 MG tablet Take 1 tablet (40 mg total) by mouth daily. 12/05/22   Enedina Finner, MD  pantoprazole (PROTONIX) 40 MG tablet Take 1 tablet (40 mg total) by mouth 2 (two) times daily. 12/05/22   Enedina Finner, MD  spironolactone (ALDACTONE) 100 MG tablet Take 1 tablet (100 mg total) by mouth daily. 12/05/22   Enedina Finner, MD  sucralfate (CARAFATE) 1 GM/10ML suspension Take 10 mLs (1 g total) by mouth 4 (four) times daily -  with meals and at bedtime. 12/05/22   Enedina Finner, MD  thiamine 100 MG tablet Take 1 tablet (100 mg total) by mouth daily. 04/07/22   Ward, Layla Maw, DO    Physical Exam: Vitals:   07/27/23 1145 07/27/23 1230 07/27/23 1430 07/27/23 1600  BP: (!) 171/78 (!) 150/62 (!) 152/72 (!) 155/63  Pulse: 75 85 68 63  Resp: 15 (!) 27 19 17   Temp:      SpO2: 100% 99% 97% 92%  Weight:      Height:       Physical Exam Constitutional:      Appearance: She is obese.  HENT:     Head: Normocephalic and atraumatic.     Nose: Nose normal.     Mouth/Throat:     Mouth: Mucous membranes are moist.  Cardiovascular:     Rate and Rhythm: Normal rate and regular rhythm.  Pulmonary:     Effort: Pulmonary effort is normal.  Abdominal:     General: Bowel sounds are normal.     Comments: + lower abdominal TTP mild to moderate   Musculoskeletal:     Cervical back: Normal range of motion.  Skin:    General: Skin is warm.  Neurological:     General: No focal deficit present.  Psychiatric:        Mood and Affect: Mood normal.     Data Reviewed:  There are no new results to review at this time. CT  ABDOMEN PELVIS W CONTRAST CLINICAL DATA:  Left lower quadrant and right lower quadrant pain. History of cirrhosis.  EXAM: CT ABDOMEN AND PELVIS WITH CONTRAST  TECHNIQUE: Multidetector CT imaging of the abdomen and pelvis was performed using the standard protocol following bolus administration of intravenous contrast.  RADIATION DOSE REDUCTION: This exam was performed according to the departmental dose-optimization program which includes automated exposure control, adjustment of the mA and/or kV according to patient size and/or use of iterative reconstruction technique.  CONTRAST:  OMNIPAQUE IOHEXOL 300 MG/ML  SOLN  COMPARISON:  CT angiogram 12/02/2022. Multiple older CT scans as well.  FINDINGS: Lower chest: There is some linear opacity  lung bases likely scar or atelectasis. No pleural effusion. Coronary artery calcifications are seen. Please correlate for other coronary risk factors. Small hiatal hernia. There is also fat and fluid adjacent to the hiatal hernia.  Hepatobiliary: Fatty liver infiltration. The liver has slight nodular contour and is overall small. Portal vein is patent. Mildly distended gallbladder with stones.  Pancreas: Unremarkable. No pancreatic ductal dilatation or surrounding inflammatory changes.  Spleen: Normal in size without focal abnormality.  Adrenals/Urinary Tract: Adrenal glands are preserved. There is moderate bilateral renal atrophy. No enhancing mass. Several benign-appearing cysts are seen. The largest is seen exophytic from the lower pole on the right measuring 4.3 cm in diameter with Hounsfield unit of 4.  Stomach/Bowel: No oral contrast. Large bowel has a normal course and caliber with some scattered stool. Normal appendix. There is however areas of mild colonic wall thickening which is mild including the ascending colon in the rectum. Scattered mesenteric stranding identified. There is also some mild fold thickening along  the stomach. Small bowel is nondilated. Please correlate with clinical symptoms.  Vascular/Lymphatic: Scattered varices. Normal caliber aorta and IVC with scattered vascular calcifications. No specific abnormal lymph node enlargement identified in the abdomen and pelvis.  Reproductive: Lobular uterus with calcifications consistent with a calcified fibroids. No separate adnexal mass. Few punctate calcifications along the left ovary as well  Other: No free intra-air.  No rim enhancing fluid collections.  Musculoskeletal: Moderate degenerative changes seen particularly along the lower lumbar spine and pelvis. Stable sclerosis of the L5 vertebral level diffusely.  IMPRESSION: Evidence of chronic liver disease. Small fatty liver with varices and mesenteric stranding. No ascites. Patent portal vein.  Gallstone.  The bowel is nondilated with a normal caliber appendix. There is some areas of wall thickening along the right side of the colon and rectum with slight stranding. Please correlate for a colitis versus colopathy in the setting of chronic liver disease. There is also some fold thickening along the stomach.  Bilateral renal atrophy with benign cysts.  Small hiatal hernia. There also some herniation of fat and fluid adjacent to the GE junction.  Coronary artery calcifications.  Electronically Signed   By: Karen Kays M.D.   On: 07/27/2023 14:23 DG Chest 2 View CLINICAL DATA:  Chest pain  EXAM: CHEST - 2 VIEW  COMPARISON:  12/02/2022 and older  FINDINGS: No consolidation, pneumothorax or effusion. No edema. Normal cardiopericardial silhouette. Aortic calcifications. Artifact from the patient's clothing. Mild degenerative changes of the spine.  IMPRESSION: No acute cardiopulmonary disease.  Electronically Signed   By: Karen Kays M.D.   On: 07/27/2023 12:41  Lab Results  Component Value Date   WBC 4.6 07/27/2023   HGB 10.6 (L) 07/27/2023   HCT 36.5  07/27/2023   MCV 84.9 07/27/2023   PLT 253 07/27/2023   Last metabolic panel Lab Results  Component Value Date   GLUCOSE 62 (L) 07/27/2023   NA 135 07/27/2023   K 4.2 07/27/2023   CL 101 07/27/2023   CO2 13 (L) 07/27/2023   BUN 29 (H) 07/27/2023   CREATININE 1.14 (H) 07/27/2023   GFRNONAA 53 (L) 07/27/2023   CALCIUM 8.6 (L) 07/27/2023   PROT 8.5 (H) 12/02/2022   ALBUMIN 3.6 12/02/2022   LABGLOB 4.4 05/03/2015   AGRATIO 0.8 (L) 05/03/2015   BILITOT 0.8 12/02/2022   ALKPHOS 97 12/02/2022   AST 92 (H) 12/02/2022   ALT 23 12/02/2022   ANIONGAP 21 (H) 07/27/2023    Assessment and Plan: *  Colitis Acute generalized abdominal pain with nausea and vomiting over the past 24 hours CT imaging concerning for colitis Will start on Rocephin and Flagyl for infectious coverage Check C. difficile and GI panel IV fluid hydration Antiemetics Pain control Follow-up gastroenterology recommendations  Hypertension BP stable Titrate regimen  Cirrhosis (HCC) Chronic alcohol cirrhosis Continue spironolactone  Metabolic acidosis Bicarb 13 in the setting of dehydration associated with nausea and vomiting as well as alcohol use AG 21 VBG and lactate pending Suspect secondary to alcohol abuse as well as starvation LR IV fluid hydration Monitor  Hypoglycemia Blood sugar in 60s in the setting of colitis and nausea vomiting Blood sugar now into the 100s Hypoglycemia protocol Monitor  Esophageal varices (HCC) Positive vomiting in setting of active colitis (nonbloody nonbilious) Concomitant active alcohol use including multiple drinks of liquor daily IV octreotide and IV PPI Continue beta-blocker Monitor Follow-up GI recommendations  Alcohol use disorder Longstanding ETOH abuse  Still drinking multiple drinks of liquor daily  CIWA protocol  Follow     Portal vein thrombosis Noted prior history of 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 Baseline prior history of GI  bleeding as well as noncompliance to Eliquis use Not on anticoagulation Monitor  Chronic CHF (HCC) 2D ECHO 12/2021 w/ EF 60-65%  Mildly dry  IVF hydration  Follow closely  CKD (chronic kidney disease), stage IIIa Cr 1.14  GFR in 50s  At baseline  Monitor    Greater than 50% was spent in counseling and coordination of care with patient Total encounter time 80 minutes or more    Advance Care Planning:   Code Status: Full Code   Consults: Gastroenterology   Family Communication: No family at the bedside   Severity of Illness: The appropriate patient status for this patient is INPATIENT. Inpatient status is judged to be reasonable and necessary in order to provide the required intensity of service to ensure the patient's safety. The patient's presenting symptoms, physical exam findings, and initial radiographic and laboratory data in the context of their chronic comorbidities is felt to place them at high risk for further clinical deterioration. Furthermore, it is not anticipated that the patient will be medically stable for discharge from the hospital within 2 midnights of admission.   * I certify that at the point of admission it is my clinical judgment that the patient will require inpatient hospital care spanning beyond 2 midnights from the point of admission due to high intensity of service, high risk for further deterioration and high frequency of surveillance required.*  Author: Floydene Flock, MD 07/27/2023 4:46 PM  For on call review [email or web address].

## 2023-07-27 NOTE — Assessment & Plan Note (Addendum)
Bicarb 13 in the setting of dehydration associated with nausea and vomiting as well as alcohol use AG 21 VBG and lactate pending Suspect secondary to alcohol abuse as well as starvation LR IV fluid hydration Monitor

## 2023-07-27 NOTE — Assessment & Plan Note (Signed)
Cr 1.14  GFR in 50s  At baseline  Monitor

## 2023-07-27 NOTE — ED Notes (Signed)
Pt drank 4 oz of ginger ale, CBG 62

## 2023-07-27 NOTE — ED Notes (Signed)
Pt given 8 oz ginger ale for blood glucose of 62. MD aware.

## 2023-07-27 NOTE — Assessment & Plan Note (Addendum)
Chronic alcohol cirrhosis Continue spironolactone

## 2023-07-27 NOTE — Plan of Care (Signed)
?  Problem: Education: ?Goal: Knowledge of General Education information will improve ?Description: Including pain rating scale, medication(s)/side effects and non-pharmacologic comfort measures ?Outcome: Progressing ?  ?Problem: Health Behavior/Discharge Planning: ?Goal: Ability to manage health-related needs will improve ?Outcome: Progressing ?  ?Problem: Clinical Measurements: ?Goal: Ability to maintain clinical measurements within normal limits will improve ?Outcome: Progressing ?Goal: Will remain free from infection ?Outcome: Progressing ?Goal: Respiratory complications will improve ?Outcome: Progressing ?Goal: Cardiovascular complication will be avoided ?Outcome: Progressing ?  ?Problem: Activity: ?Goal: Risk for activity intolerance will decrease ?Outcome: Progressing ?  ?Problem: Coping: ?Goal: Level of anxiety will decrease ?Outcome: Progressing ?  ?Problem: Elimination: ?Goal: Will not experience complications related to bowel motility ?Outcome: Progressing ?Goal: Will not experience complications related to urinary retention ?Outcome: Progressing ?  ?Problem: Pain Managment: ?Goal: General experience of comfort will improve ?Outcome: Progressing ?  ?Problem: Safety: ?Goal: Ability to remain free from injury will improve ?Outcome: Progressing ?  ?Problem: Skin Integrity: ?Goal: Risk for impaired skin integrity will decrease ?Outcome: Progressing ?  ?

## 2023-07-27 NOTE — Consult Note (Signed)
Consultation  Referring Provider:  Hospitalist    Admit date: 07/27/2023 Consult date: 07/27/2023    Reason for Consultation: Abnormal imaging             HPI:   Kathryn [name] is a 67 y.o. lady with history of alcoholic cirrhosis with varices s/p banding with no varices on EGD in January of this year who presented with flu like symptoms including myalgias and lower abdominal pain after running out of hydralazine. She also endorses nausea and vomiting. No hematemesis. No diarrhea. A CT scan in the ED showed colitis vs portal hypertensive colopathy. She is still drinking two drinks daily. Last bowel movement was Sunday. Labs seem hemoconcentrated as baseline hemoglobin is around 8.  Past Medical History:  Diagnosis Date   Alcoholic cirrhosis of liver (HCC)    Asthma    CHF (congestive heart failure) (HCC)    Chronic disease anemia    Esophageal varices (HCC)    GR I on EGD by Dr Shelle Iron 09/2014   ETOH abuse    Hypertension    Hypothyroidism    Murmur    Portal hypertension (HCC)    Sarcoidosis     Past Surgical History:  Procedure Laterality Date   ESOPHAGOGASTRODUODENOSCOPY N/A 06/26/2021   Procedure: ESOPHAGOGASTRODUODENOSCOPY (EGD);  Surgeon: Toledo, Boykin Nearing, MD;  Location: [facility] ENDOSCOPY;  Service: Gastroenterology;  Laterality: N/A;   ESOPHAGOGASTRODUODENOSCOPY (EGD) WITH PROPOFOL N/A 04/04/2015   Procedure: ESOPHAGOGASTRODUODENOSCOPY (EGD) WITH PROPOFOL;  Surgeon: Midge Minium, MD;  Location: [facility] ENDOSCOPY;  Service: Endoscopy;  Laterality: N/A;  Dr Servando Snare prefers 1445   ESOPHAGOGASTRODUODENOSCOPY (EGD) WITH PROPOFOL N/A 12/04/2022   Procedure: ESOPHAGOGASTRODUODENOSCOPY (EGD) WITH PROPOFOL;  Surgeon: [name], MD;  Location: [facility] ENDOSCOPY;  Service: Gastroenterology;  Laterality: N/A;   ESOPHAGOGASTRODUODENOSCOPY W/ BANDING  09/2014   [place]   TUBAL LIGATION      Family History  Problem Relation Age of Onset   Aneurysm Father    COPD Father    Heart attack Mother     Social  History   Tobacco Use   Smoking status: Never   Smokeless tobacco: Never  Vaping Use   Vaping status: Never Used  Substance Use Topics   Alcohol use: Yes    Alcohol/week: 15.0 standard drinks of alcohol    Types: 15 Shots of liquor per week   Drug use: No    Prior to Admission medications   Medication Sig Start Date End Date Taking? Authorizing Provider  amLODipine (NORVASC) 10 MG tablet Take 1 tablet (10 mg total) by mouth daily. 12/05/22 03/05/23  Enedina Finner, MD  B Complex-C-Folic Acid (B COMPLEX-VITAMIN C-FOLIC ACID) 1 MG tablet Take 1 tablet by mouth daily with breakfast. 12/05/22   Enedina Finner, MD  ferrous sulfate 325 (65 FE) MG tablet Take 1 tablet (325 mg total) by mouth daily with breakfast. 12/05/22   Enedina Finner, MD  folic acid (FOLVITE) 1 MG tablet Take 1 tablet (1 mg total) by mouth daily. 12/05/22   Enedina Finner, MD  hydrALAZINE (APRESOLINE) 25 MG tablet Take 1 tablet (25 mg total) by mouth every 8 (eight) hours. 12/05/22   Enedina Finner, MD  levothyroxine (SYNTHROID) 175 MCG tablet Take 1 tablet (175 mcg total) by mouth daily before breakfast. 12/05/22 01/04/23  Enedina Finner, MD  Multiple Vitamin (MULTIVITAMIN) tablet Take 1 tablet by mouth every other day.    [provider]  nadolol (CORGARD) 40 MG tablet Take 1 tablet (40 mg total) by mouth daily. 12/05/22  Enedina Finner, MD  pantoprazole (PROTONIX) 40 MG tablet Take 1 tablet (40 mg total) by mouth 2 (two) times daily. 12/05/22   Enedina Finner, MD  spironolactone (ALDACTONE) 100 MG tablet Take 1 tablet (100 mg total) by mouth daily. 12/05/22   Enedina Finner, MD  sucralfate (CARAFATE) 1 GM/10ML suspension Take 10 mLs (1 g total) by mouth 4 (four) times daily -  with meals and at bedtime. 12/05/22   Enedina Finner, MD  thiamine 100 MG tablet Take 1 tablet (100 mg total) by mouth daily. 04/07/22   Ward, Layla Maw, DO    Current Facility-Administered Medications  Medication Dose Route Frequency Provider Last Rate Last Admin   dextrose 5  % in lactated ringers infusion   Intravenous Continuous Floydene Flock, MD       enoxaparin (LOVENOX) injection 40 mg  40 mg Subcutaneous Q24H Floydene Flock, MD       fentaNYL (SUBLIMAZE) injection 50 mcg  50 mcg Intravenous Once Janith Lima, MD       folic acid (FOLVITE) tablet 1 mg  1 mg Oral Daily Floydene Flock, MD       insulin aspart (novoLOG) injection 0-9 Units  0-9 Units Subcutaneous Q4H Floydene Flock, MD       [START ON 07/28/2023] levothyroxine (SYNTHROID) tablet 175 mcg  175 mcg Oral QAC breakfast Floydene Flock, MD       LORazepam (ATIVAN) tablet 1-4 mg  1-4 mg Oral Q1H PRN Floydene Flock, MD       Or   LORazepam (ATIVAN) injection 1-4 mg  1-4 mg Intravenous Q1H PRN Floydene Flock, MD       multivitamin with minerals tablet 1 tablet  1 tablet Oral Daily Floydene Flock, MD       nadolol (CORGARD) tablet 40 mg  40 mg Oral Daily Floydene Flock, MD       octreotide (SANDOSTATIN) 2 mcg/mL load via infusion 50 mcg  50 mcg Intravenous Once Floydene Flock, MD       And   octreotide (SANDOSTATIN) 500 mcg in sodium chloride 0.9 % 250 mL (2 mcg/mL) infusion  50 mcg/hr Intravenous Continuous Floydene Flock, MD       ondansetron [facility]) tablet 4 mg  4 mg Oral Q6H PRN Floydene Flock, MD       Or   ondansetron [facility]) injection 4 mg  4 mg Intravenous Q6H PRN Floydene Flock, MD   4 mg at 07/27/23 1639   pantoprazole (PROTONIX) injection 40 mg  40 mg Intravenous Q12H Floydene Flock, MD       spironolactone (ALDACTONE) tablet 100 mg  100 mg Oral Daily Floydene Flock, MD       sucralfate (CARAFATE) 1 GM/10ML suspension 1 g  1 g Oral TID WC & HS Floydene Flock, MD       thiamine (VITAMIN B1) tablet 100 mg  100 mg Oral Daily Floydene Flock, MD       Or   thiamine (VITAMIN B1) injection 100 mg  100 mg Intravenous Daily Floydene Flock, MD       Current Outpatient Medications  Medication Sig Dispense Refill   amLODipine (NORVASC) 10 MG tablet Take 1 tablet (10  mg total) by mouth daily. 30 tablet 2   B Complex-C-Folic Acid (B COMPLEX-VITAMIN C-FOLIC ACID) 1 MG tablet Take 1 tablet by mouth daily with breakfast. 90 tablet 3   ferrous  sulfate 325 (65 FE) MG tablet Take 1 tablet (325 mg total) by mouth daily with breakfast. 30 tablet 3   folic acid (FOLVITE) 1 MG tablet Take 1 tablet (1 mg total) by mouth daily. 30 tablet 2   hydrALAZINE (APRESOLINE) 25 MG tablet Take 1 tablet (25 mg total) by mouth every 8 (eight) hours. 90 tablet 2   levothyroxine (SYNTHROID) 175 MCG tablet Take 1 tablet (175 mcg total) by mouth daily before breakfast. 30 tablet 0   Multiple Vitamin (MULTIVITAMIN) tablet Take 1 tablet by mouth every other day.     nadolol (CORGARD) 40 MG tablet Take 1 tablet (40 mg total) by mouth daily. 30 tablet 2   pantoprazole (PROTONIX) 40 MG tablet Take 1 tablet (40 mg total) by mouth 2 (two) times daily. 60 tablet 2   spironolactone (ALDACTONE) 100 MG tablet Take 1 tablet (100 mg total) by mouth daily. 30 tablet 0   sucralfate (CARAFATE) 1 GM/10ML suspension Take 10 mLs (1 g total) by mouth 4 (four) times daily -  with meals and at bedtime. 420 mL 0   thiamine 100 MG tablet Take 1 tablet (100 mg total) by mouth daily. 90 tablet 3    Allergies as of 07/27/2023 - Review Complete 07/27/2023  Allergen Reaction Noted   Hydrocodone-acetaminophen Other (See Comments) 04/04/2015   Ibuprofen Other (See Comments) 04/04/2015   Naproxen Other (See Comments) 04/04/2015   Propoxyphene Other (See Comments) 04/04/2015   Shellfish allergy Hives 10/26/2019   Tamiflu  [oseltamivir phosphate] Other (See Comments) 04/04/2015     Review of Systems:    All systems reviewed and negative except where noted in HPI.  Review of Systems  Constitutional:  Positive for malaise/fatigue. Negative for fever.  Respiratory:  Negative for shortness of breath.   Cardiovascular:  Positive for chest pain.  Gastrointestinal:  Positive for abdominal pain, nausea and vomiting.  Negative for blood in stool, constipation, diarrhea and melena.  Musculoskeletal:  Positive for back pain and myalgias.  Skin:  Negative for itching and rash.  Neurological:  Negative for focal weakness.  Psychiatric/Behavioral:  Positive for substance abuse.   All other systems reviewed and are negative.      Physical Exam:  Vital signs in last 24 hours: Temp:  [98.2 F (36.8 C)] 98.2 F (36.8 C) (09/03 1104) Pulse Rate:  [63-85] 63 (09/03 1600) Resp:  [15-27] 17 (09/03 1600) BP: (150-180)/(62-85) 155/63 (09/03 1600) SpO2:  [92 %-100 %] 92 % (09/03 1600) Weight:  [79.4 kg] 79.4 kg (09/03 1105)   General:   Pleasant in NAD Head:  Normocephalic and atraumatic. Eyes:   No icterus.   Conjunctiva pink. Mouth: Mucosa pink moist, no lesions. Neck:  Supple; no masses felt Lungs:  No respiratory distress Abdomen:   Flat, soft, nondistended, nontender Msk: No clubbing or cyanosis. Neurologic:  Alert and  oriented x4; No focal deficits Skin:  Warm, dry, pink without significant lesions or rashes. Psych:  Alert and cooperative. Normal affect.  LAB RESULTS: Recent Labs    07/27/23 1105  WBC 4.6  HGB 10.6*  HCT 36.5  PLT 253   BMET Recent Labs    07/27/23 1105  NA 135  K 4.2  CL 101  CO2 13*  GLUCOSE 62*  BUN 29*  CREATININE 1.14*  CALCIUM 8.6*   LFT No results for input(s): "PROT", "ALBUMIN", "AST", "ALT", "ALKPHOS", "BILITOT", "BILIDIR", "IBILI" in the last 72 hours. PT/INR No results for input(s): "LABPROT", "INR" in the last 72  hours.  STUDIES: CT ABDOMEN PELVIS W CONTRAST  Result Date: 07/27/2023 CLINICAL DATA:  Left lower quadrant and right lower quadrant pain. History of cirrhosis. EXAM: CT ABDOMEN AND PELVIS WITH CONTRAST TECHNIQUE: Multidetector CT imaging of the abdomen and pelvis was performed using the standard protocol following bolus administration of intravenous contrast. RADIATION DOSE REDUCTION: This exam was performed according to the departmental  dose-optimization program which includes automated exposure control, adjustment of the mA and/or kV according to patient size and/or use of iterative reconstruction technique. CONTRAST:  OMNIPAQUE IOHEXOL 300 MG/ML  SOLN COMPARISON:  CT angiogram 12/02/2022. Multiple older CT scans as well. FINDINGS: Lower chest: There is some linear opacity lung bases likely scar or atelectasis. No pleural effusion. Coronary artery calcifications are seen. Please correlate for other coronary risk factors. Small hiatal hernia. There is also fat and fluid adjacent to the hiatal hernia. Hepatobiliary: Fatty liver infiltration. The liver has slight nodular contour and is overall small. Portal vein is patent. Mildly distended gallbladder with stones. Pancreas: Unremarkable. No pancreatic ductal dilatation or surrounding inflammatory changes. Spleen: Normal in size without focal abnormality. Adrenals/Urinary Tract: Adrenal glands are preserved. There is moderate bilateral renal atrophy. No enhancing mass. Several benign-appearing cysts are seen. The largest is seen exophytic from the lower pole on the right measuring 4.3 cm in diameter with Hounsfield unit of 4. Stomach/Bowel: No oral contrast. Large bowel has a normal course and caliber with some scattered stool. Normal appendix. There is however areas of mild colonic wall thickening which is mild including the ascending colon in the rectum. Scattered mesenteric stranding identified. There is also some mild fold thickening along the stomach. Small bowel is nondilated. Please correlate with clinical symptoms. Vascular/Lymphatic: Scattered varices. Normal caliber aorta and IVC with scattered vascular calcifications. No specific abnormal lymph node enlargement identified in the abdomen and pelvis. Reproductive: Lobular uterus with calcifications consistent with a calcified fibroids. No separate adnexal mass. Few punctate calcifications along the left ovary as well Other: No free  intra-air.  No rim enhancing fluid collections. Musculoskeletal: Moderate degenerative changes seen particularly along the lower lumbar spine and pelvis. Stable sclerosis of the L5 vertebral level diffusely. IMPRESSION: Evidence of chronic liver disease. Small fatty liver with varices and mesenteric stranding. No ascites. Patent portal vein. Gallstone. The bowel is nondilated with a normal caliber appendix. There is some areas of wall thickening along the right side of the colon and rectum with slight stranding. Please correlate for a colitis versus colopathy in the setting of chronic liver disease. There is also some fold thickening along the stomach. Bilateral renal atrophy with benign cysts. Small hiatal hernia. There also some herniation of fat and fluid adjacent to the GE junction. Coronary artery calcifications. Electronically Signed   By: Karen Kays M.D.   On: 07/27/2023 14:23   DG Chest 2 View  Result Date: 07/27/2023 CLINICAL DATA:  Chest pain EXAM: CHEST - 2 VIEW COMPARISON:  12/02/2022 and older FINDINGS: No consolidation, pneumothorax or effusion. No edema. Normal cardiopericardial silhouette. Aortic calcifications. Artifact from the patient's clothing. Mild degenerative changes of the spine. IMPRESSION: No acute cardiopulmonary disease. Electronically Signed   By: Karen Kays M.D.   On: 07/27/2023 12:41       Impression / Plan:   67 y/o lady with history of alcoholic cirrhosis with varices s/p banding with no varices on EGD in January of this year who presented with flu like symptoms including myalgias and lower abdominal pain after running out of  hydralazine. 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 Suspect her symptoms are secondary to abrupt hydralazine withdrawal.  - recommend stopping octreotide and antibiotics - ok for PO PPI - gentle IV fluids due to history of portal hypertension - control blood pressure - IV anti-emetics - suspect  drop in hemoglobin due to hemodilution - daily CMP and INR - will likely follow peripherally as no GI intervention currently indicated  Please call with any questions or concerns.  Merlyn Lot MD, MPH [facility]

## 2023-07-27 NOTE — Assessment & Plan Note (Signed)
Longstanding ETOH abuse  Still drinking multiple drinks of liquor daily  CIWA protocol  Follow

## 2023-07-27 NOTE — Assessment & Plan Note (Addendum)
Positive vomiting in setting of active colitis (nonbloody nonbilious) Concomitant active alcohol use including multiple drinks of liquor daily IV octreotide and IV PPI Continue beta-blocker Monitor Follow-up GI recommendations

## 2023-07-27 NOTE — Assessment & Plan Note (Signed)
Acute generalized abdominal pain with nausea and vomiting over the past 24 hours CT imaging concerning for colitis Will start on Rocephin and Flagyl for infectious coverage Check C. difficile and GI panel IV fluid hydration Antiemetics Pain control Follow-up gastroenterology recommendations

## 2023-07-28 DIAGNOSIS — R112 Nausea with vomiting, unspecified: Secondary | ICD-10-CM

## 2023-07-28 DIAGNOSIS — R197 Diarrhea, unspecified: Secondary | ICD-10-CM

## 2023-07-28 DIAGNOSIS — I8500 Esophageal varices without bleeding: Secondary | ICD-10-CM

## 2023-07-28 DIAGNOSIS — I81 Portal vein thrombosis: Secondary | ICD-10-CM

## 2023-07-28 DIAGNOSIS — F1090 Alcohol use, unspecified, uncomplicated: Secondary | ICD-10-CM

## 2023-07-28 DIAGNOSIS — I10 Essential (primary) hypertension: Secondary | ICD-10-CM

## 2023-07-28 DIAGNOSIS — K7030 Alcoholic cirrhosis of liver without ascites: Secondary | ICD-10-CM

## 2023-07-28 DIAGNOSIS — E872 Acidosis, unspecified: Secondary | ICD-10-CM | POA: Diagnosis not present

## 2023-07-28 LAB — COMPREHENSIVE METABOLIC PANEL
ALT: 14 U/L (ref 0–44)
AST: 33 U/L (ref 15–41)
Albumin: 3.2 g/dL — ABNORMAL LOW (ref 3.5–5.0)
Alkaline Phosphatase: 65 U/L (ref 38–126)
Anion gap: 10 (ref 5–15)
BUN: 23 mg/dL (ref 8–23)
CO2: 20 mmol/L — ABNORMAL LOW (ref 22–32)
Calcium: 8.2 mg/dL — ABNORMAL LOW (ref 8.9–10.3)
Chloride: 102 mmol/L (ref 98–111)
Creatinine, Ser: 1.07 mg/dL — ABNORMAL HIGH (ref 0.44–1.00)
GFR, Estimated: 57 mL/min — ABNORMAL LOW (ref >60)
Glucose, Bld: 102 mg/dL — ABNORMAL HIGH (ref 70–99)
Potassium: 4.7 mmol/L (ref 3.5–5.1)
Sodium: 132 mmol/L — ABNORMAL LOW (ref 135–145)
Total Bilirubin: 1 mg/dL (ref 0.3–1.2)
Total Protein: 7.8 g/dL (ref 6.5–8.1)

## 2023-07-28 LAB — CBC
HCT: 31.2 % — ABNORMAL LOW (ref 36.0–46.0)
Hemoglobin: 9.6 g/dL — ABNORMAL LOW (ref 12.0–15.0)
MCH: 25.1 pg — ABNORMAL LOW (ref 26.0–34.0)
MCHC: 30.8 g/dL (ref 30.0–36.0)
MCV: 81.5 fL (ref 80.0–100.0)
Platelets: 106 10*3/uL — ABNORMAL LOW (ref 150–400)
RBC: 3.83 MIL/uL — ABNORMAL LOW (ref 3.87–5.11)
RDW: 23.7 % — ABNORMAL HIGH (ref 11.5–15.5)
WBC: 2.7 10*3/uL — ABNORMAL LOW (ref 4.0–10.5)
nRBC: 1.1 % — ABNORMAL HIGH (ref 0.0–0.2)

## 2023-07-28 LAB — GLUCOSE, CAPILLARY
Glucose-Capillary: 100 mg/dL — ABNORMAL HIGH (ref 70–99)
Glucose-Capillary: 101 mg/dL — ABNORMAL HIGH (ref 70–99)
Glucose-Capillary: 104 mg/dL — ABNORMAL HIGH (ref 70–99)
Glucose-Capillary: 120 mg/dL — ABNORMAL HIGH (ref 70–99)
Glucose-Capillary: 124 mg/dL — ABNORMAL HIGH (ref 70–99)

## 2023-07-28 MED ORDER — NADOLOL 20 MG PO TABS
40.0000 mg | ORAL_TABLET | Freq: Every day | ORAL | Status: DC
  Administered 2023-07-28: 40 mg via ORAL
  Filled 2023-07-28: qty 2

## 2023-07-28 MED ORDER — HYDRALAZINE HCL 25 MG PO TABS: 25.0000 mg | ORAL_TABLET | Freq: Three times a day (TID) | ORAL | 2 refills | Status: DC

## 2023-07-28 NOTE — Progress Notes (Signed)
Discharge instructions, Rx's and follow up appts explained and provided to patient  verbalized understanding.  Alakai Macbride, Kae Heller, RN

## 2023-07-28 NOTE — Hospital Course (Addendum)
Taken from H&P.  Kathryn [name] is a 67 y.o. 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  Patient reports sudden onset of generalized abdominal pain, nausea, decreased p.o. intake over the past 1 to 2 days.  No reported sick contacts.  No fevers or chills.   Presented to the ER afebrile, hemodynamically stable. White count 4.6, hemoglobin 10.6, platelets 253, lactate 2.3, VBG with metabolic acidosis. Glucose 62-1 15. CT abdomen pelvis with noted chronic liver disease with varices and mesenteric stranding no ascites.  Concern of colitis versus portal hypertensive colopathy. She was started on Rocephin and Flagyl.  GI was consulted  9/4: Per GI stop antibiotics as it is likely portal hypertensive colopathy and no infection, they are recommending outpatient follow-up.  They were thinking that his abdominal symptoms might be due to abruptly stopping hydralazine. Patient did not had any diarrhea.  Abdominal pain resolved.  Able to tolerate diet without any difficulty.  GI stopped all antibiotics and will follow-up as outpatient. Per patient she ran out of her hydralazine for the past few days-new prescriptions were given.  There is also some concern of being noncompliant with her medications.  Patient was counseled for medication compliance and to stop drinking.  She is still drinking alcohol.  Patient will continue on her current medications and need to have a close follow-up with her providers for further recommendations.

## 2023-07-28 NOTE — Plan of Care (Signed)
  Problem: Education: Goal: Knowledge of General Education information will improve Description Including pain rating scale, medication(s)/side effects and non-pharmacologic comfort measures Outcome: Progressing   Problem: Health Behavior/Discharge Planning: Goal: Ability to manage health-related needs will improve Outcome: Progressing   

## 2023-07-28 NOTE — Care Management Obs Status (Signed)
MEDICARE OBSERVATION STATUS NOTIFICATION   Patient Details  Name: Kathryn [name] MRN: [id number] Date of Birth: 1956/06/24   Medicare Observation Status Notification Given:  Yes    Garret Reddish, RN 07/28/2023, 4:17 PM

## 2023-07-28 NOTE — Care Management CC44 (Signed)
Condition Code 44 Documentation Completed  Patient Details  Name: Kathryn [name] MRN: [id number] Date of Birth: December 01, 1955   Condition Code 44 given:  Yes Patient signature on Condition Code 44 notice:  Yes Documentation of 2 MD's agreement:  Yes Code 44 added to claim:  Yes    Garret Reddish, RN 07/28/2023, 4:17 PM

## 2023-07-28 NOTE — Discharge Summary (Signed)
Physician Discharge Summary   Patient: Kathryn [name] DOB: 05-Mar-1956  Admit date:     07/27/2023  Discharge date: 07/28/23  Discharge Physician: Arnetha Courser   PCP: Leanord Asal, Nelva Bush, MD   Recommendations at discharge:  Please obtain CBC and CMP on follow-up Follow-up with primary care provider Follow-up with gastroenterology  Discharge Diagnoses: Principal Problem:   Colitis Active Problems:   Cirrhosis (HCC)   Hypertension   Esophageal varices (HCC)   Hypoglycemia   Metabolic acidosis   CKD (chronic kidney disease), stage IIIa   Chronic CHF (HCC)   Portal vein thrombosis   Alcohol use disorder   Nausea vomiting and diarrhea   Hospital Course: Taken from H&P.  Kathryn [name] is a 67 y.o. 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  Patient reports sudden onset of generalized abdominal pain, nausea, decreased p.o. intake over the past 1 to 2 days.  No reported sick contacts.  No fevers or chills.   Presented to the ER afebrile, hemodynamically stable. White count 4.6, hemoglobin 10.6, platelets 253, lactate 2.3, VBG with metabolic acidosis. Glucose 62-1 15. CT abdomen pelvis with noted chronic liver disease with varices and mesenteric stranding no ascites.  Concern of colitis versus portal hypertensive colopathy. She was started on Rocephin and Flagyl.  GI was consulted  9/4: Per GI stop antibiotics as it is likely portal hypertensive colopathy and no infection, they are recommending outpatient follow-up.  They were thinking that his abdominal symptoms might be due to abruptly stopping hydralazine. Patient did not had any diarrhea.  Abdominal pain resolved.  Able to tolerate diet without any difficulty.  GI stopped all antibiotics and  will follow-up as outpatient. Per patient she ran out of her hydralazine for the past few days-new prescriptions were given.  There is also some concern of being noncompliant with her medications.  Patient was counseled for medication compliance and to stop drinking.  She is still drinking alcohol.  Patient will continue on her current medications and need to have a close follow-up with her providers for further recommendations.   Consultants: Gastroenterology Procedures performed: None Disposition: Home Diet recommendation:  Discharge Diet Orders (From admission, onward)     Start     Ordered   07/28/23 0000  Diet - low sodium heart healthy        07/28/23 1557           Cardiac diet DISCHARGE MEDICATION: Allergies as of 07/28/2023       Reactions   Hydrocodone-acetaminophen Other (See Comments)   Reaction: pt can't take anything with tylenol due to her liver disease.    Ibuprofen Other (See Comments)   Esophageal varices   Naproxen Other (See Comments)   Esophageal varices   Propoxyphene Other (See Comments)   Reaction: pt isn't sure, but knows she can't take it.    Shellfish Allergy Hives   Tamiflu  [oseltamivir Phosphate] Other (See Comments)   Esophageal varices        Medication List     STOP taking these medications    amLODipine 10 MG tablet Commonly known as: NORVASC   sucralfate 1 GM/10ML suspension Commonly known as: CARAFATE       TAKE these medications    albuterol 108 (90 Base) MCG/ACT inhaler Commonly known as: VENTOLIN HFA Inhale 1-2 puffs into the  lungs every 4 (four) hours as needed for wheezing or shortness of breath.   B complex-vitamin C-folic acid 1 MG tablet Take 1 tablet by mouth daily with breakfast.   ferrous sulfate 325 (65 FE) MG tablet Take 1 tablet (325 mg total) by mouth daily with breakfast.   folic acid 1 MG tablet Commonly known as: FOLVITE Take 1 tablet (1 mg total) by mouth daily.   hydrALAZINE 25 MG  tablet Commonly known as: APRESOLINE Take 1 tablet (25 mg total) by mouth every 8 (eight) hours.   hydrochlorothiazide 25 MG tablet Commonly known as: HYDRODIURIL Take 25 mg by mouth daily.   levothyroxine 125 MCG tablet Commonly known as: SYNTHROID Take 125 mcg by mouth daily.   multivitamin tablet Take 1 tablet by mouth every other day.   nadolol 40 MG tablet Commonly known as: CORGARD Take 1 tablet (40 mg total) by mouth daily.   omeprazole 40 MG capsule Commonly known as: PRILOSEC Take 40 mg by mouth 2 (two) times daily.   pantoprazole 40 MG tablet Commonly known as: PROTONIX Take 1 tablet (40 mg total) by mouth 2 (two) times daily.   spironolactone 50 MG tablet Commonly known as: ALDACTONE Take 50 mg by mouth daily.   thiamine 100 MG tablet Commonly known as: VITAMIN B1 Take 1 tablet (100 mg total) by mouth daily.        Follow-up Information     Leanord Asal, Nelva Bush, MD. Schedule an appointment as soon as possible for a visit in 1 week(s).   Specialty: Family Medicine Contact information: [address] [place] [address] [phone]         Regis Bill, MD. Schedule an appointment as soon as possible for a visit.   Specialty: Gastroenterology Contact information: [address] [place] [address] [phone]                Discharge Exam: Ceasar Mons Weights   07/27/23 1105  Weight: 79.4 kg   General.  Well-developed lady, in no acute distress. Pulmonary.  Lungs clear bilaterally, normal respiratory effort. CV.  Regular rate and rhythm, no JVD, rub or murmur. Abdomen.  Soft, nontender, nondistended, BS positive. CNS.  Alert and oriented .  No focal neurologic deficit. Extremities.  No edema, no cyanosis, pulses intact and symmetrical. Psychiatry.  Judgment and insight appears normal.   Condition at discharge: stable  The results of significant diagnostics from this hospitalization (including imaging,  microbiology, ancillary and laboratory) are listed below for reference.   Imaging Studies: CT ABDOMEN PELVIS W CONTRAST  Result Date: 07/27/2023 CLINICAL DATA:  Left lower quadrant and right lower quadrant pain. History of cirrhosis. EXAM: CT ABDOMEN AND PELVIS WITH CONTRAST TECHNIQUE: Multidetector CT imaging of the abdomen and pelvis was performed using the standard protocol following bolus administration of intravenous contrast. RADIATION DOSE REDUCTION: This exam was performed according to the departmental dose-optimization program which includes automated exposure control, adjustment of the mA and/or kV according to patient size and/or use of iterative reconstruction technique. CONTRAST:  OMNIPAQUE IOHEXOL 300 MG/ML  SOLN COMPARISON:  CT angiogram 12/02/2022. Multiple older CT scans as well. FINDINGS: Lower chest: There is some linear opacity lung bases likely scar or atelectasis. No pleural effusion. Coronary artery calcifications are seen. Please correlate for other coronary risk factors. Small hiatal hernia. There is also fat and fluid adjacent to the hiatal hernia. Hepatobiliary: Fatty liver infiltration. The liver has slight nodular contour and is overall small. Portal vein  is patent. Mildly distended gallbladder with stones. Pancreas: Unremarkable. No pancreatic ductal dilatation or surrounding inflammatory changes. Spleen: Normal in size without focal abnormality. Adrenals/Urinary Tract: Adrenal glands are preserved. There is moderate bilateral renal atrophy. No enhancing mass. Several benign-appearing cysts are seen. The largest is seen exophytic from the lower pole on the right measuring 4.3 cm in diameter with Hounsfield unit of 4. Stomach/Bowel: No oral contrast. Large bowel has a normal course and caliber with some scattered stool. Normal appendix. There is however areas of mild colonic wall thickening which is mild including the ascending colon in the rectum. Scattered mesenteric  stranding identified. There is also some mild fold thickening along the stomach. Small bowel is nondilated. Please correlate with clinical symptoms. Vascular/Lymphatic: Scattered varices. Normal caliber aorta and IVC with scattered vascular calcifications. No specific abnormal lymph node enlargement identified in the abdomen and pelvis. Reproductive: Lobular uterus with calcifications consistent with a calcified fibroids. No separate adnexal mass. Few punctate calcifications along the left ovary as well Other: No free intra-air.  No rim enhancing fluid collections. Musculoskeletal: Moderate degenerative changes seen particularly along the lower lumbar spine and pelvis. Stable sclerosis of the L5 vertebral level diffusely. IMPRESSION: Evidence of chronic liver disease. Small fatty liver with varices and mesenteric stranding. No ascites. Patent portal vein. Gallstone. The bowel is nondilated with a normal caliber appendix. There is some areas of wall thickening along the right side of the colon and rectum with slight stranding. Please correlate for a colitis versus colopathy in the setting of chronic liver disease. There is also some fold thickening along the stomach. Bilateral renal atrophy with benign cysts. Small hiatal hernia. There also some herniation of fat and fluid adjacent to the GE junction. Coronary artery calcifications. Electronically Signed   By: Karen Kays M.D.   On: 07/27/2023 14:23   DG Chest 2 View  Result Date: 07/27/2023 CLINICAL DATA:  Chest pain EXAM: CHEST - 2 VIEW COMPARISON:  12/02/2022 and older FINDINGS: No consolidation, pneumothorax or effusion. No edema. Normal cardiopericardial silhouette. Aortic calcifications. Artifact from the patient's clothing. Mild degenerative changes of the spine. IMPRESSION: No acute cardiopulmonary disease. Electronically Signed   By: Karen Kays M.D.   On: 07/27/2023 12:41    Microbiology: Results for orders placed or performed during the hospital  encounter of 07/27/23  Resp panel by RT-PCR (RSV, Flu A&B, Covid) Anterior Nasal Swab     Status: None   Collection Time: 07/27/23 12:33 PM   Specimen: Anterior Nasal Swab  Result Value Ref Range Status   SARS Coronavirus 2 by RT PCR NEGATIVE NEGATIVE Final    Comment: (NOTE) SARS-CoV-2 target nucleic acids are NOT DETECTED.  The SARS-CoV-2 RNA is generally detectable in upper respiratory specimens during the acute phase of infection. The lowest concentration of SARS-CoV-2 viral copies this assay can detect is 138 copies/mL. A negative result does not preclude SARS-Cov-2 infection and should not be used as the sole basis for treatment or other patient management decisions. A negative result may occur with  improper specimen collection/handling, submission of specimen other than nasopharyngeal swab, presence of viral mutation(s) within the areas targeted by this assay, and inadequate number of viral copies(<138 copies/mL). A negative result must be combined with clinical observations, patient history, and epidemiological information. The expected result is Negative.  Fact Sheet for Patients:  BloggerCourse.com  Fact Sheet for Healthcare Providers:  SeriousBroker.it  This test is no t yet approved or cleared by the [place] and  has been authorized for detection and/or diagnosis of SARS-CoV-2 by FDA under an Emergency Use Authorization (EUA). This EUA will remain  in effect (meaning this test can be used) for the duration of the COVID-19 declaration under Section 564(b)(1) of the Act, 21 U.S.C.section 360bbb-3(b)(1), unless the authorization is terminated  or revoked sooner.       Influenza A by PCR NEGATIVE NEGATIVE Final   Influenza B by PCR NEGATIVE NEGATIVE Final    Comment: (NOTE) The Xpert Xpress SARS-CoV-2/FLU/RSV plus assay is intended as an aid in the diagnosis of influenza from Nasopharyngeal swab specimens  and should not be used as a sole basis for treatment. Nasal washings and aspirates are unacceptable for Xpert Xpress SARS-CoV-2/FLU/RSV testing.  Fact Sheet for Patients: BloggerCourse.com  Fact Sheet for Healthcare Providers: SeriousBroker.it  This test is not yet approved or cleared by the [place] FDA and has been authorized for detection and/or diagnosis of SARS-CoV-2 by FDA under an Emergency Use Authorization (EUA). This EUA will remain in effect (meaning this test can be used) for the duration of the COVID-19 declaration under Section 564(b)(1) of the Act, 21 U.S.C. section 360bbb-3(b)(1), unless the authorization is terminated or revoked.     Resp Syncytial Virus by PCR NEGATIVE NEGATIVE Final    Comment: (NOTE) Fact Sheet for Patients: BloggerCourse.com  Fact Sheet for Healthcare Providers: SeriousBroker.it  This test is not yet approved or cleared by the [place] FDA and has been authorized for detection and/or diagnosis of SARS-CoV-2 by FDA under an Emergency Use Authorization (EUA). This EUA will remain in effect (meaning this test can be used) for the duration of the COVID-19 declaration under Section 564(b)(1) of the Act, 21 U.S.C. section 360bbb-3(b)(1), unless the authorization is terminated or revoked.  Performed at [facility], [address] [place], [place] [address]     Labs: CBC: Recent Labs  Lab 07/27/23 1105 07/28/23 0556  WBC 4.6 2.7*  HGB 10.6* 9.6*  HCT 36.5 31.2*  MCV 84.9 81.5  PLT 253 106*   Basic Metabolic Panel: Recent Labs  Lab 07/27/23 1105 07/27/23 1905 07/28/23 0556  NA 135 134* 132*  K 4.2 4.7 4.7  CL 101 100 102  CO2 13* 14* 20*  GLUCOSE 62* 94 102*  BUN 29* 24* 23  CREATININE 1.14* 1.03* 1.07*  CALCIUM 8.6* 8.3* 8.2*   Liver Function Tests: Recent Labs  Lab 07/28/23 0556  AST 33  ALT 14   ALKPHOS 65  BILITOT 1.0  PROT 7.8  ALBUMIN 3.2*   CBG: Recent Labs  Lab 07/27/23 2100 07/28/23 0057 07/28/23 0439 07/28/23 0749 07/28/23 1256  GLUCAP 97 100* 101* 104* 120*    Discharge time spent: greater than 30 minutes.  This record has been created using Conservation officer, historic buildings. Errors have been sought and corrected,but may not always be located. Such creation errors do not reflect on the standard of care.   Signed: Arnetha Courser, MD [facility] 07/28/2023

## 2023-07-28 NOTE — Plan of Care (Signed)
  Problem: Education: Goal: Knowledge of General Education information will improve Description: Including pain rating scale, medication(s)/side effects and non-pharmacologic comfort measures 07/28/2023 1711 by Genevie Ann, RN Outcome: Adequate for Discharge 07/28/2023 1711 by Genevie Ann, RN Outcome: Adequate for Discharge 07/28/2023 1036 by Genevie Ann, RN Outcome: Progressing   Problem: Health Behavior/Discharge Planning: Goal: Ability to manage health-related needs will improve 07/28/2023 1711 by Genevie Ann, RN Outcome: Adequate for Discharge 07/28/2023 1711 by Genevie Ann, RN Outcome: Adequate for Discharge 07/28/2023 1036 by Genevie Ann, RN Outcome: Progressing   Problem: Clinical Measurements: Goal: Ability to maintain clinical measurements within normal limits will improve 07/28/2023 1711 by Genevie Ann, RN Outcome: Adequate for Discharge 07/28/2023 1711 by Genevie Ann, RN Outcome: Adequate for Discharge Goal: Will remain free from infection 07/28/2023 1711 by Genevie Ann, RN Outcome: Adequate for Discharge 07/28/2023 1711 by Genevie Ann, RN Outcome: Adequate for Discharge Goal: Diagnostic test results will improve 07/28/2023 1711 by Genevie Ann, RN Outcome: Adequate for Discharge 07/28/2023 1711 by Genevie Ann, RN Outcome: Adequate for Discharge Goal: Respiratory complications will improve 07/28/2023 1711 by Genevie Ann, RN Outcome: Adequate for Discharge 07/28/2023 1711 by Genevie Ann, RN Outcome: Adequate for Discharge Goal: Cardiovascular complication will be avoided 07/28/2023 1711 by Genevie Ann, RN Outcome: Adequate for Discharge 07/28/2023 1711 by Genevie Ann, RN Outcome: Adequate for Discharge   Problem: Activity: Goal: Risk for activity intolerance will decrease 07/28/2023 1711 by Genevie Ann, RN Outcome: Adequate for Discharge 07/28/2023 1711 by Genevie Ann, RN Outcome:  Adequate for Discharge   Problem: Nutrition: Goal: Adequate nutrition will be maintained 07/28/2023 1711 by Genevie Ann, RN Outcome: Adequate for Discharge 07/28/2023 1711 by Genevie Ann, RN Outcome: Adequate for Discharge   Problem: Coping: Goal: Level of anxiety will decrease 07/28/2023 1711 by Genevie Ann, RN Outcome: Adequate for Discharge 07/28/2023 1711 by Genevie Ann, RN Outcome: Adequate for Discharge   Problem: Elimination: Goal: Will not experience complications related to bowel motility 07/28/2023 1711 by Genevie Ann, RN Outcome: Adequate for Discharge 07/28/2023 1711 by Genevie Ann, RN Outcome: Adequate for Discharge Goal: Will not experience complications related to urinary retention 07/28/2023 1711 by Genevie Ann, RN Outcome: Adequate for Discharge 07/28/2023 1711 by Genevie Ann, RN Outcome: Adequate for Discharge   Problem: Pain Managment: Goal: General experience of comfort will improve 07/28/2023 1711 by Genevie Ann, RN Outcome: Adequate for Discharge 07/28/2023 1711 by Genevie Ann, RN Outcome: Adequate for Discharge   Problem: Safety: Goal: Ability to remain free from injury will improve 07/28/2023 1711 by Genevie Ann, RN Outcome: Adequate for Discharge 07/28/2023 1711 by Genevie Ann, RN Outcome: Adequate for Discharge   Problem: Skin Integrity: Goal: Risk for impaired skin integrity will decrease 07/28/2023 1711 by Genevie Ann, RN Outcome: Adequate for Discharge 07/28/2023 1711 by Genevie Ann, RN Outcome: Adequate for Discharge

## 2023-07-28 NOTE — Plan of Care (Signed)
  Problem: Education: Goal: Knowledge of General Education information will improve Description: Including pain rating scale, medication(s)/side effects and non-pharmacologic comfort measures 07/28/2023 1711 by Genevie Ann, RN Outcome: Adequate for Discharge 07/28/2023 1036 by Genevie Ann, RN Outcome: Progressing   Problem: Education: Goal: Knowledge of General Education information will improve Description: Including pain rating scale, medication(s)/side effects and non-pharmacologic comfort measures 07/28/2023 1036 by Genevie Ann, RN Outcome: Progressing   Problem: Education: Goal: Knowledge of General Education information will improve Description: Including pain rating scale, medication(s)/side effects and non-pharmacologic comfort measures 07/28/2023 1711 by Genevie Ann, RN Outcome: Adequate for Discharge 07/28/2023 1036 by Genevie Ann, RN Outcome: Progressing   Problem: Health Behavior/Discharge Planning: Goal: Ability to manage health-related needs will improve 07/28/2023 1711 by Genevie Ann, RN Outcome: Adequate for Discharge 07/28/2023 1036 by Genevie Ann, RN Outcome: Progressing   Problem: Clinical Measurements: Goal: Ability to maintain clinical measurements within normal limits will improve Outcome: Adequate for Discharge Goal: Will remain free from infection Outcome: Adequate for Discharge Goal: Diagnostic test results will improve Outcome: Adequate for Discharge Goal: Respiratory complications will improve Outcome: Adequate for Discharge Goal: Cardiovascular complication will be avoided Outcome: Adequate for Discharge   Problem: Activity: Goal: Risk for activity intolerance will decrease Outcome: Adequate for Discharge   Problem: Nutrition: Goal: Adequate nutrition will be maintained Outcome: Adequate for Discharge   Problem: Coping: Goal: Level of anxiety will decrease Outcome: Adequate for Discharge   Problem:  Elimination: Goal: Will not experience complications related to bowel motility Outcome: Adequate for Discharge Goal: Will not experience complications related to urinary retention Outcome: Adequate for Discharge   Problem: Pain Managment: Goal: General experience of comfort will improve Outcome: Adequate for Discharge   Problem: Safety: Goal: Ability to remain free from injury will improve Outcome: Adequate for Discharge   Problem: Skin Integrity: Goal: Risk for impaired skin integrity will decrease Outcome: Adequate for Discharge

## 2023-08-02 ENCOUNTER — Other Ambulatory Visit: Payer: Self-pay | Admitting: Family Medicine

## 2023-08-02 DIAGNOSIS — Z1231 Encounter for screening mammogram for malignant neoplasm of breast: Secondary | ICD-10-CM

## 2023-08-31 ENCOUNTER — Inpatient Hospital Stay: Admission: RE | Admit: 2023-08-31 | Payer: 59 | Source: Ambulatory Visit

## 2023-11-19 ENCOUNTER — Emergency Department: Payer: 59

## 2023-11-19 ENCOUNTER — Other Ambulatory Visit: Payer: Self-pay

## 2023-11-19 ENCOUNTER — Emergency Department
Admission: EM | Admit: 2023-11-19 | Discharge: 2023-11-20 | Disposition: A | Discharge status: Home / Self Care | Payer: 59 | Attending: Emergency Medicine | Admitting: Emergency Medicine

## 2023-11-19 DIAGNOSIS — W19XXXA Unspecified fall, initial encounter: Secondary | ICD-10-CM

## 2023-11-19 DIAGNOSIS — S0003XA Contusion of scalp, initial encounter: Secondary | ICD-10-CM | POA: Diagnosis not present

## 2023-11-19 DIAGNOSIS — W01198A Fall on same level from slipping, tripping and stumbling with subsequent striking against other object, initial encounter: Secondary | ICD-10-CM | POA: Insufficient documentation

## 2023-11-19 DIAGNOSIS — D696 Thrombocytopenia, unspecified: Secondary | ICD-10-CM | POA: Insufficient documentation

## 2023-11-19 DIAGNOSIS — D72819 Decreased white blood cell count, unspecified: Secondary | ICD-10-CM | POA: Diagnosis not present

## 2023-11-19 DIAGNOSIS — S0990XA Unspecified injury of head, initial encounter: Secondary | ICD-10-CM | POA: Diagnosis present

## 2023-11-19 LAB — COMPREHENSIVE METABOLIC PANEL
ALT: 29 U/L (ref 0–44)
AST: 97 U/L — ABNORMAL HIGH (ref 15–41)
Albumin: 3.7 g/dL (ref 3.5–5.0)
Alkaline Phosphatase: 112 U/L (ref 38–126)
Anion gap: 15 (ref 5–15)
BUN: 14 mg/dL (ref 8–23)
CO2: 23 mmol/L (ref 22–32)
Calcium: 8.3 mg/dL — ABNORMAL LOW (ref 8.9–10.3)
Chloride: 104 mmol/L (ref 98–111)
Creatinine, Ser: 0.81 mg/dL (ref 0.44–1.00)
GFR, Estimated: >60 mL/min (ref >60)
Glucose, Bld: 80 mg/dL (ref 70–99)
Potassium: 3.6 mmol/L (ref 3.5–5.1)
Sodium: 142 mmol/L (ref 135–145)
Total Bilirubin: 1 mg/dL (ref <1.2)
Total Protein: 8 g/dL (ref 6.5–8.1)

## 2023-11-19 LAB — CBC WITH DIFFERENTIAL/PLATELET
Abs Immature Granulocytes: 0.01 10*3/uL (ref 0.00–0.07)
Basophils Absolute: 0 10*3/uL (ref 0.0–0.1)
Basophils Relative: 2 %
Eosinophils Absolute: 0 10*3/uL (ref 0.0–0.5)
Eosinophils Relative: 2 %
HCT: 30.9 % — ABNORMAL LOW (ref 36.0–46.0)
Hemoglobin: 9.7 g/dL — ABNORMAL LOW (ref 12.0–15.0)
Immature Granulocytes: 1 %
Lymphocytes Relative: 32 %
Lymphs Abs: 0.6 10*3/uL — ABNORMAL LOW (ref 0.7–4.0)
MCH: 29.1 pg (ref 26.0–34.0)
MCHC: 31.4 g/dL (ref 30.0–36.0)
MCV: 92.8 fL (ref 80.0–100.0)
Monocytes Absolute: 0.2 10*3/uL (ref 0.1–1.0)
Monocytes Relative: 11 %
Neutro Abs: 1 10*3/uL — ABNORMAL LOW (ref 1.7–7.7)
Neutrophils Relative %: 52 %
Platelets: 84 10*3/uL — ABNORMAL LOW (ref 150–400)
RBC: 3.33 MIL/uL — ABNORMAL LOW (ref 3.87–5.11)
RDW: 22.3 % — ABNORMAL HIGH (ref 11.5–15.5)
Smear Review: NORMAL
WBC: 1.9 10*3/uL — ABNORMAL LOW (ref 4.0–10.5)
nRBC: 0 % (ref 0.0–0.2)

## 2023-11-19 LAB — TROPONIN I (HIGH SENSITIVITY): Troponin I (High Sensitivity): 13 ng/L (ref <18)

## 2023-11-19 NOTE — ED Triage Notes (Signed)
First Nurse Note:  BIB [facility] from home. Pt reported drinking liquor at her nieces house, went home and fell in the living room. Reports feeling light headed. Hx of HTN reported to EMS. EMS Bp 174/85. Pt alert and oriented with EMS. Knot noted to forehead. Denies daily blood thinners or LOC.   EMS VS:  CBG 75 97% RA HR 77

## 2023-11-19 NOTE — ED Triage Notes (Signed)
Pt to ED via EMS from home, pt reports she fell tonight hitting her head on the floor, pt does not take blood thinners. Pt reports headache from fall and left rib pain. Pt has been drinking liquor tonight.

## 2023-11-19 NOTE — ED Provider Notes (Signed)
[facility] Provider Note    Event Date/Time   First MD Initiated Contact with Patient 11/19/23 2200     (approximate)   History   Fall   HPI  Kathryn [name] is a 67 y.o. female who presents to the emergency department today after a fall.  The patient states she did hit her head and is having a headache.  She was walking when she passed out.  She does admit to drinking but states that she was not drunk when this happened.  She is not sure why she passed out.  She is not having any chest pain.     Physical Exam   Triage Vital Signs: ED Triage Vitals  Encounter Vitals Group     BP 11/19/23 1927 (!) 174/88     Systolic BP Percentile --      Diastolic BP Percentile --      Pulse Rate 11/19/23 1927 79     Resp 11/19/23 1927 18     Temp 11/19/23 1927 (!) 97.4 F (36.3 C)     Temp Source 11/19/23 1927 Oral     SpO2 11/19/23 1927 97 %     Weight 11/19/23 1925 179 lb (81.2 kg)     Height 11/19/23 1925 5\' 4"  (1.626 m)     Head Circumference --      Peak Flow --      Pain Score 11/19/23 1925 8     Pain Loc --      Pain Education --      Exclude from Growth Chart --     Most recent vital signs: Vitals:   11/19/23 1927  BP: (!) 174/88  Pulse: 79  Resp: 18  Temp: (!) 97.4 F (36.3 C)  SpO2: 97%   General: Awake, alert. Oriented. CV:  Good peripheral perfusion. Regular rate and rhythm. Resp:  Normal effort. Lungs clear. Abd:  No distention.  Skin:  Small contusion to left forehead.  ED Results / Procedures / Treatments   Labs (all labs ordered are listed, but only abnormal results are displayed) Labs Reviewed - No data to display   EKG  None   RADIOLOGY I independently interpreted and visualized the CT head. My interpretation: No bleed Radiology interpretation:  IMPRESSION:  No acute intracranial abnormality.    I independently interpreted and visualized the CXR. My interpretation: No pneumonia, no pneumothorax. Radiology  interpretation:  IMPRESSION:  No active cardiopulmonary disease.     PROCEDURES:  Critical Care performed: No  MEDICATIONS ORDERED IN ED: Medications - No data to display   IMPRESSION / MDM / ASSESSMENT AND PLAN / ED COURSE  I reviewed the triage vital signs and the nursing notes.                              Differential diagnosis includes, but is not limited to, alcohol intoxication, syncope, anemia, electrolyte abnormality, ICH  Patient's presentation is most consistent with acute presentation with potential threat to life or bodily function.  Patient presents to the emergency department today after a fall.  CT head and chest x-ray which were ordered from triage without concerning abnormalities.  However given the patient is unclear why she fell will check blood work and EKG.  If blood work without any concerning findings I do think it would be reasonable for patient to be discharged.     FINAL CLINICAL IMPRESSION(S) / ED DIAGNOSES  Final diagnoses:  Fall, initial encounter  Hematoma of scalp, initial encounter    Note:  This document was prepared using Dragon voice recognition software and may include unintentional dictation errors.    Phineas Semen, MD 11/19/23 2322

## 2023-11-20 NOTE — Discharge Instructions (Signed)
Your lab work shows that your white blood cells, hemoglobin, and platelets are all low.  The platelet count is lower than it has been recently.  You should follow-up with a hematologist (blood specialist).  We have given you referral information for our hematology office.  In the meantime, return to the ER for new, worsening, or persistent severe weakness, dizziness, recurrent falls, abnormal bleeding or bruising, or any other new or worsening symptoms that concern you.

## 2023-11-20 NOTE — ED Provider Notes (Signed)
-----------------------------------------   12:52 AM on 11/20/2023 -----------------------------------------  I took over care of this patient from Dr. Derrill Kay.  The patient presented with a fall.  Lab workup and EKG were pending, but plan was discharged home unless there were concerning acute lab findings.  CBC is significant for anemia, low WBC count, and thrombocytopenia.  Compared to recent labs, the platelets and WBC count are slightly lower and the hemoglobin is stable.  Troponin is negative.  CMP shows no acute findings.  EKG is nonischemic.  ED ECG REPORT I, Dionne Bucy, the attending physician, personally viewed and interpreted this ECG.  Date: 11/20/2023 EKG Time: 2339 Rate: 71 Rhythm: normal sinus rhythm QRS Axis: normal Intervals: normal ST/T Wave abnormalities: normal Narrative Interpretation: no evidence of acute ischemia   These abnormal findings on the CBC are chronic and noncontributory to the patient's fall.  The patient remains asymptomatic, states she feels well and would like to go home.  She is stable for discharge at this time.  I counseled her and her family members on the results of the labs.  I will give her a referral to hematology.  I gave strict return precautions and the patient and family expressed understanding.   Dionne Bucy, MD 11/20/23 [phone]

## 2023-12-02 ENCOUNTER — Inpatient Hospital Stay: Payer: 59 | Admitting: Oncology

## 2023-12-02 ENCOUNTER — Other Ambulatory Visit: Payer: 59

## 2023-12-02 ENCOUNTER — Inpatient Hospital Stay: Payer: 59

## 2023-12-08 ENCOUNTER — Inpatient Hospital Stay: Payer: 59 | Attending: Oncology | Admitting: Oncology

## 2023-12-08 ENCOUNTER — Inpatient Hospital Stay: Payer: 59

## 2023-12-08 ENCOUNTER — Encounter: Payer: Self-pay | Admitting: Oncology

## 2023-12-08 VITALS — BP 171/94 | HR 67 | Temp 96.5°F | Resp 16 | Ht 64.0 in | Wt 177.0 lb

## 2023-12-08 DIAGNOSIS — D649 Anemia, unspecified: Secondary | ICD-10-CM | POA: Diagnosis not present

## 2023-12-08 DIAGNOSIS — D61818 Other pancytopenia: Secondary | ICD-10-CM | POA: Diagnosis present

## 2023-12-08 DIAGNOSIS — D696 Thrombocytopenia, unspecified: Secondary | ICD-10-CM

## 2023-12-08 LAB — CBC (CANCER CENTER ONLY)
HCT: 33.2 % — ABNORMAL LOW (ref 36.0–46.0)
Hemoglobin: 10.6 g/dL — ABNORMAL LOW (ref 12.0–15.0)
MCH: 30.5 pg (ref 26.0–34.0)
MCHC: 31.9 g/dL (ref 30.0–36.0)
MCV: 95.4 fL (ref 80.0–100.0)
Platelet Count: 108 10*3/uL — ABNORMAL LOW (ref 150–400)
RBC: 3.48 MIL/uL — ABNORMAL LOW (ref 3.87–5.11)
RDW: 22.6 % — ABNORMAL HIGH (ref 11.5–15.5)
WBC Count: 3 10*3/uL — ABNORMAL LOW (ref 4.0–10.5)
nRBC: 0 % (ref 0.0–0.2)

## 2023-12-08 LAB — LACTATE DEHYDROGENASE: LDH: 170 U/L (ref 98–192)

## 2023-12-08 LAB — FOLATE: Folate: 13.7 ng/mL (ref >5.9)

## 2023-12-08 LAB — FERRITIN: Ferritin: 47 ng/mL (ref 11–307)

## 2023-12-08 LAB — RETICULOCYTES
Immature Retic Fract: 28.9 % — ABNORMAL HIGH (ref 2.3–15.9)
RBC.: 3.5 MIL/uL — ABNORMAL LOW (ref 3.87–5.11)
Retic Count, Absolute: 93.1 10*3/uL (ref 19.0–186.0)
Retic Ct Pct: 2.7 % (ref 0.4–3.1)

## 2023-12-08 LAB — IRON AND TIBC
Iron: 45 ug/dL (ref 28–170)
Saturation Ratios: 12 % (ref 10.4–31.8)
TIBC: 364 ug/dL (ref 250–450)
UIBC: 319 ug/dL

## 2023-12-08 LAB — VITAMIN B12: Vitamin B-12: 440 pg/mL (ref 180–914)

## 2023-12-08 NOTE — Progress Notes (Signed)
 [facility]  [phone] [phone]  ID: Kathryn [name] OB: 02/02/1956  MR#: [id number]  UXL#:[id number]  Patient Care Team: Tawnya Fava, Waddell Guess, MD as PCP - General (Family Medicine)  CHIEF COMPLAINT: Pancytopenia.  INTERVAL HISTORY: Patient is a 68 year old female who presented to the emergency room with a syncopal episode and found to have pancytopenia.  She did not require admission to the hospital and no etiology of her episode was determined.  She currently feels well and is back to her baseline.  She has no neurologic complaints.  She denies any recent fevers or illnesses.  She has a good appetite and denies weight loss.  She has no chest pain, shortness of breath, cough, or hemoptysis.  She denies any nausea, vomiting, constipation, or diarrhea.  She has no urinary complaints.  Patient offers no specific complaints today.  REVIEW OF SYSTEMS:   Review of Systems  Constitutional: Negative.  Negative for fever, malaise/fatigue and weight loss.  Respiratory: Negative.  Negative for cough, hemoptysis and shortness of breath.   Cardiovascular: Negative.  Negative for chest pain and leg swelling.  Gastrointestinal: Negative.  Negative for abdominal pain.  Genitourinary: Negative.  Negative for dysuria.  Musculoskeletal: Negative.  Negative for back pain.  Skin: Negative.  Negative for rash.  Neurological: Negative.  Negative for dizziness, focal weakness, seizures, weakness and headaches.  Psychiatric/Behavioral: Negative.  The patient is not nervous/anxious.     As per HPI. Otherwise, a complete review of systems is negative.  PAST MEDICAL HISTORY: Past Medical History:  Diagnosis Date   Alcoholic cirrhosis of liver (HCC)    Asthma    CHF (congestive heart failure) (HCC)    Chronic disease anemia    Esophageal varices (HCC)    GR I on EGD by Dr Leatrice Provost 09/2014   ETOH abuse    Hypertension    Hypothyroidism    Murmur    Portal  hypertension (HCC)    Sarcoidosis     PAST SURGICAL HISTORY: Past Surgical History:  Procedure Laterality Date   ESOPHAGOGASTRODUODENOSCOPY N/A 06/26/2021   Procedure: ESOPHAGOGASTRODUODENOSCOPY (EGD);  Surgeon: Toledo, Alphonsus Jeans, MD;  Location: [facility] ENDOSCOPY;  Service: Gastroenterology;  Laterality: N/A;   ESOPHAGOGASTRODUODENOSCOPY (EGD) WITH PROPOFOL  N/A 04/04/2015   Procedure: ESOPHAGOGASTRODUODENOSCOPY (EGD) WITH PROPOFOL ;  Surgeon: Marnee Sink, MD;  Location: [facility] ENDOSCOPY;  Service: Endoscopy;  Laterality: N/A;  Dr Ole Berkeley prefers 1445   ESOPHAGOGASTRODUODENOSCOPY (EGD) WITH PROPOFOL  N/A 12/04/2022   Procedure: ESOPHAGOGASTRODUODENOSCOPY (EGD) WITH PROPOFOL ;  Surgeon: Luke Salaam, MD;  Location: [facility] ENDOSCOPY;  Service: Gastroenterology;  Laterality: N/A;   ESOPHAGOGASTRODUODENOSCOPY W/ BANDING  09/2014   [facility]   TUBAL LIGATION      FAMILY HISTORY: Family History  Problem Relation Age of Onset   Aneurysm Father    COPD Father    Heart attack Mother     ADVANCED DIRECTIVES (Y/N):  N  HEALTH MAINTENANCE: Social History   Tobacco Use   Smoking status: Never   Smokeless tobacco: Never  Vaping Use   Vaping status: Never Used  Substance Use Topics   Alcohol use: Yes    Alcohol/week: 15.0 standard drinks of alcohol    Types: 15 Shots of liquor per week   Drug use: No     Colonoscopy:  PAP:  Bone density:  Lipid panel:  Allergies  Allergen Reactions   Hydrocodone-Acetaminophen  Other (See Comments)    Reaction: pt can't take anything with tylenol  due to her liver disease.  Ibuprofen Other (See Comments)    Esophageal varices   Naproxen Other (See Comments)    Esophageal varices   Propoxyphene Other (See Comments)    Reaction: pt isn't sure, but knows she can't take it.    Shellfish Allergy Hives   Tamiflu  [Oseltamivir Phosphate] Other (See Comments)    Esophageal varices    Current Outpatient Medications  Medication Sig Dispense Refill   albuterol   (VENTOLIN  HFA) 108 (90 Base) MCG/ACT inhaler Inhale 1-2 puffs into the lungs every 4 (four) hours as needed for wheezing or shortness of breath.     B Complex-C-Folic Acid  (B COMPLEX-VITAMIN C-FOLIC ACID ) 1 MG tablet Take 1 tablet by mouth daily with breakfast. 90 tablet 3   ferrous sulfate  325 (65 FE) MG tablet Take 1 tablet (325 mg total) by mouth daily with breakfast. 30 tablet 3   folic acid  (FOLVITE ) 1 MG tablet Take 1 tablet (1 mg total) by mouth daily. 30 tablet 2   hydrALAZINE  (APRESOLINE ) 25 MG tablet Take 1 tablet (25 mg total) by mouth every 8 (eight) hours. 90 tablet 2   hydrochlorothiazide (HYDRODIURIL) 25 MG tablet Take 25 mg by mouth daily.     levothyroxine  (SYNTHROID ) 125 MCG tablet Take 125 mcg by mouth daily.     Multiple Vitamin (MULTIVITAMIN) tablet Take 1 tablet by mouth every other day.     nadolol  (CORGARD ) 40 MG tablet Take 1 tablet (40 mg total) by mouth daily. 30 tablet 2   omeprazole (PRILOSEC) 40 MG capsule Take 40 mg by mouth 2 (two) times daily.     spironolactone  (ALDACTONE ) 50 MG tablet Take 50 mg by mouth daily.     thiamine  100 MG tablet Take 1 tablet (100 mg total) by mouth daily. 90 tablet 3   pantoprazole  (PROTONIX ) 40 MG tablet Take 1 tablet (40 mg total) by mouth 2 (two) times daily. (Patient not taking: Reported on 07/27/2023) 60 tablet 2   No current facility-administered medications for this visit.    OBJECTIVE: Vitals:   12/08/23 1118  BP: (!) 171/94  Pulse: 67  Resp: 16  Temp: (!) 96.5 F (35.8 C)  SpO2: 100%     Body mass index is 30.38 kg/m.    ECOG FS:0 - Asymptomatic  General: Well-developed, well-nourished, no acute distress. Eyes: Pink conjunctiva, anicteric sclera. HEENT: Normocephalic, moist mucous membranes. Lungs: No audible wheezing or coughing. Heart: Regular rate and rhythm. Abdomen: Soft, nontender, no obvious distention. Musculoskeletal: No edema, cyanosis, or clubbing. Neuro: Alert, answering all questions appropriately.  Cranial nerves grossly intact. Skin: No rashes or petechiae noted. Psych: Normal affect. Lymphatics: No cervical, calvicular, axillary or inguinal LAD.   LAB RESULTS:  Lab Results  Component Value Date   NA 142 11/19/2023   K 3.6 11/19/2023   CL 104 11/19/2023   CO2 23 11/19/2023   GLUCOSE 80 11/19/2023   BUN 14 11/19/2023   CREATININE 0.81 11/19/2023   CALCIUM  8.3 (L) 11/19/2023   PROT 8.0 11/19/2023   ALBUMIN 3.7 11/19/2023   AST 97 (H) 11/19/2023   ALT 29 11/19/2023   ALKPHOS 112 11/19/2023   BILITOT 1.0 11/19/2023   GFRNONAA >60 11/19/2023   GFRAA 48 (L) 04/14/2020    Lab Results  Component Value Date   WBC 3.0 (L) 12/08/2023   NEUTROABS 1.0 (L) 11/19/2023   HGB 10.6 (L) 12/08/2023   HCT 33.2 (L) 12/08/2023   MCV 95.4 12/08/2023   PLT 108 (L) 12/08/2023     STUDIES: CT  HEAD WO CONTRAST ( ) Result Date: 11/19/2023 CLINICAL DATA:  Head trauma, minor (Age >= 65y) Pt reported drinking liquor at her nieces house, went home and fell in the living EXAM: CT HEAD WITHOUT CONTRAST TECHNIQUE: Contiguous axial images were obtained from the base of the skull through the vertex without intravenous contrast. RADIATION DOSE REDUCTION: This exam was performed according to the departmental dose-optimization program which includes automated exposure control, adjustment of the mA and/or kV according to patient size and/or use of iterative reconstruction technique. COMPARISON:  CT head 04/07/2022 FINDINGS: Brain: No evidence of large-territorial acute infarction. No parenchymal hemorrhage. No mass lesion. No extra-axial collection. No mass effect or midline shift. No hydrocephalus. Basilar cisterns are patent. Vascular: No hyperdense vessel. Skull: No acute fracture or focal lesion. Sinuses/Orbits: Left mastoid air cell partial effusion. Otherwise paranasal sinuses and right mastoid air cells are clear. The orbits are unremarkable. Other: None. IMPRESSION: No acute intracranial  abnormality. Electronically Signed   By: Morgane  Naveau M.D.   On: 11/19/2023 20:02   DG Chest 2 View Result Date: 11/19/2023 CLINICAL DATA:  Fall EXAM: CHEST - 2 VIEW COMPARISON:  07/27/2023 FINDINGS: The heart size and mediastinal contours are within normal limits. Both lungs are clear. No pneumothorax. No displaced fracture identified. IMPRESSION: No active cardiopulmonary disease. Electronically Signed   By: Leverne Reading D.O.   On: 11/19/2023 19:54    ASSESSMENT: Pancytopenia.  PLAN:    Leukopenia: Patient's total white blood cell count is 3.0 today which is improved from 1.9 in the emergency room approximately 2 weeks ago.  All of her other laboratory work from today is pending at time of dictation including iron  stores, B12, folate, SPEP, flow cytometry, and IntelliGEN myeloid panel.  No intervention is needed at this time.  Patient does not require bone marrow biopsy.  Return to clinic in 1 month with repeat laboratory work and further evaluation. Anemia: Patient's hemoglobin has trended up and is now 10.6.  Patient is immature reticulate fraction is appropriately elevated.  Pending laboratory work as above. Thrombocytopenia: Patient's platelet count has also trended up and is now 108.  CT scan of the abdomen and pelvis on January 24, 2023 did not reveal any splenomegaly.  I spent a total of 45 minutes reviewing chart data, face-to-face evaluation with the patient, counseling and coordination of care as detailed above.   Patient expressed understanding and was in agreement with this plan. She also understands that She can call clinic at any time with any questions, concerns, or complaints.    Kathryn Dials, MD   12/08/2023 2:11 PM

## 2023-12-09 LAB — PLATELET ANTIBODY PROFILE
Glycoprotein IV Antibody: NEGATIVE
HLA Ab Ser Ql EIA: NEGATIVE
IA/IIA Antibody: NEGATIVE
IB/IX Antibody: NEGATIVE
IIB/IIIA Antibody: NEGATIVE

## 2023-12-09 LAB — HAPTOGLOBIN: Haptoglobin: <10 mg/dL — ABNORMAL LOW (ref 37–355)

## 2023-12-13 LAB — COMP PANEL: LEUKEMIA/LYMPHOMA

## 2023-12-14 LAB — NEUTROPHIL AB TEST LEVEL 1

## 2023-12-15 LAB — PROTEIN ELECTROPHORESIS, SERUM
A/G Ratio: 0.9 (ref 0.7–1.7)
Albumin ELP: 3.9 g/dL (ref 2.9–4.4)
Alpha-1-Globulin: 0.3 g/dL (ref 0.0–0.4)
Alpha-2-Globulin: 0.5 g/dL (ref 0.4–1.0)
Beta Globulin: 1.6 g/dL — ABNORMAL HIGH (ref 0.7–1.3)
Gamma Globulin: 2.1 g/dL — ABNORMAL HIGH (ref 0.4–1.8)
Globulin, Total: 4.5 g/dL — ABNORMAL HIGH (ref 2.2–3.9)
Total Protein ELP: 8.4 g/dL (ref 6.0–8.5)

## 2023-12-22 IMAGING — US US ABDOMEN LIMITED
1 series · 2 of 2 positions shown · non-contrast
Comparison: CT scan 01/03/2022

CLINICAL DATA: Ascites.  Evaluate for paracentesis.

EXAM:
LIMITED ABDOMEN ULTRASOUND FOR ASCITES
TECHNIQUE: Limited ultrasound survey for ascites was performed in all four
abdominal quadrants.

[Series 1: us abdomen limited · 0.26mm/px · 2 of 2 slices shown]
[im 1/2]
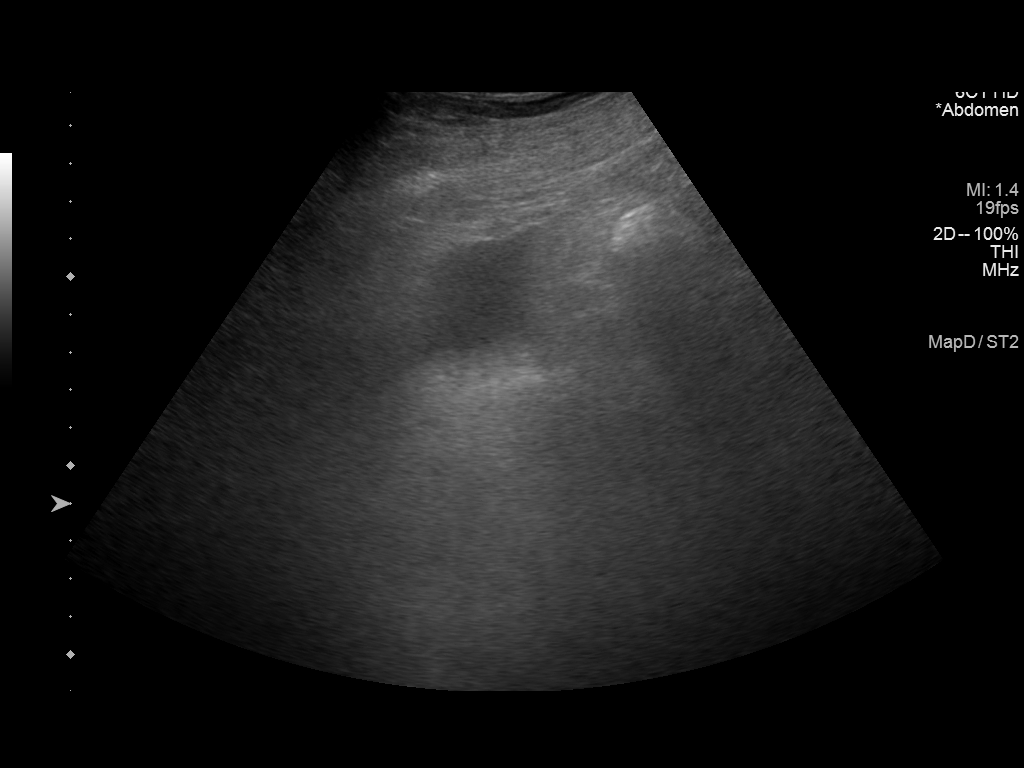
[im 2/2]
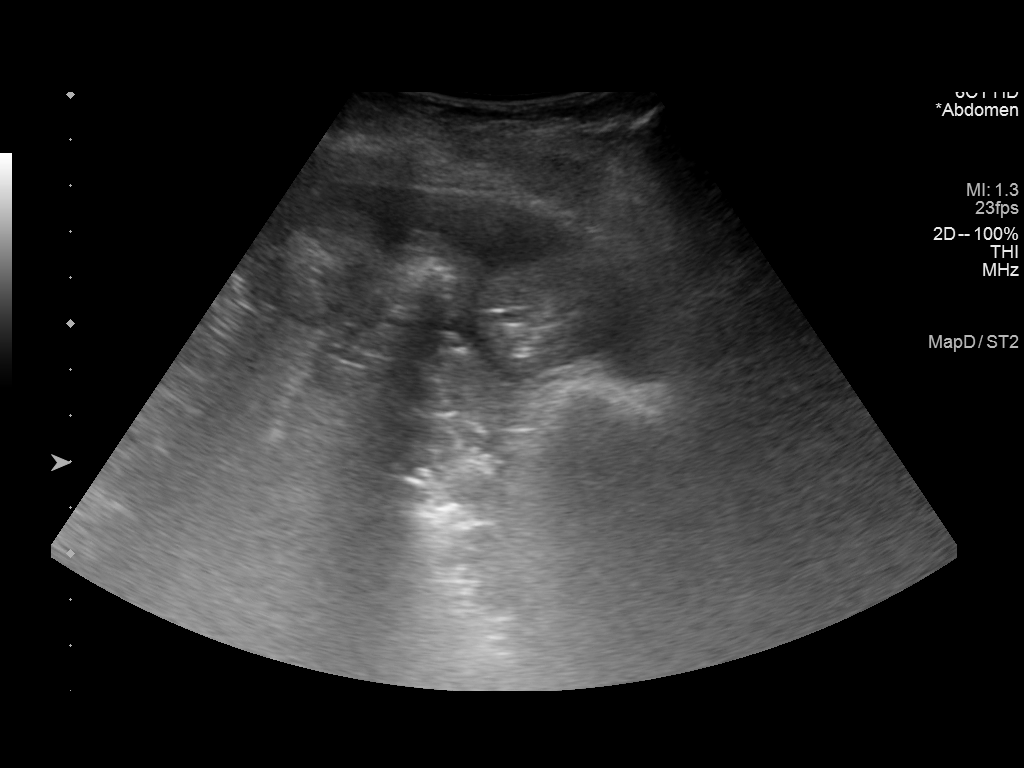

[2 of 2 positions shown; findings below may reference images not displayed]

FINDINGS: Small volume ascites evident.
IMPRESSION: Insufficient ascites volume for safe paracentesis.

## 2023-12-28 LAB — INTELLIGEN MYELOID

## 2024-01-06 ENCOUNTER — Inpatient Hospital Stay: Payer: 59 | Admitting: Oncology

## 2024-01-06 ENCOUNTER — Inpatient Hospital Stay: Payer: 59

## 2024-01-20 ENCOUNTER — Inpatient Hospital Stay: Payer: 59 | Attending: Oncology

## 2024-01-20 ENCOUNTER — Inpatient Hospital Stay (HOSPITAL_BASED_OUTPATIENT_CLINIC_OR_DEPARTMENT_OTHER): Payer: 59 | Admitting: Oncology

## 2024-01-20 ENCOUNTER — Encounter: Payer: Self-pay | Admitting: Oncology

## 2024-01-20 VITALS — BP 170/74 | HR 60 | Temp 97.6°F | Resp 20 | Ht 64.0 in | Wt 187.8 lb

## 2024-01-20 DIAGNOSIS — D61818 Other pancytopenia: Secondary | ICD-10-CM | POA: Diagnosis present

## 2024-01-20 DIAGNOSIS — D696 Thrombocytopenia, unspecified: Secondary | ICD-10-CM | POA: Insufficient documentation

## 2024-01-20 DIAGNOSIS — D649 Anemia, unspecified: Secondary | ICD-10-CM | POA: Insufficient documentation

## 2024-01-20 LAB — CBC WITH DIFFERENTIAL/PLATELET
Abs Immature Granulocytes: 0.01 10*3/uL (ref 0.00–0.07)
Basophils Absolute: 0 10*3/uL (ref 0.0–0.1)
Basophils Relative: 1 %
Eosinophils Absolute: 0.1 10*3/uL (ref 0.0–0.5)
Eosinophils Relative: 3 %
HCT: 32.5 % — ABNORMAL LOW (ref 36.0–46.0)
Hemoglobin: 10.1 g/dL — ABNORMAL LOW (ref 12.0–15.0)
Immature Granulocytes: 0 %
Lymphocytes Relative: 16 %
Lymphs Abs: 0.6 10*3/uL — ABNORMAL LOW (ref 0.7–4.0)
MCH: 28.5 pg (ref 26.0–34.0)
MCHC: 31.1 g/dL (ref 30.0–36.0)
MCV: 91.5 fL (ref 80.0–100.0)
Monocytes Absolute: 0.5 10*3/uL (ref 0.1–1.0)
Monocytes Relative: 12 %
Neutro Abs: 2.6 10*3/uL (ref 1.7–7.7)
Neutrophils Relative %: 68 %
Platelets: 142 10*3/uL — ABNORMAL LOW (ref 150–400)
RBC: 3.55 MIL/uL — ABNORMAL LOW (ref 3.87–5.11)
RDW: 16.6 % — ABNORMAL HIGH (ref 11.5–15.5)
WBC: 3.9 10*3/uL — ABNORMAL LOW (ref 4.0–10.5)
nRBC: 0 % (ref 0.0–0.2)

## 2024-01-20 NOTE — Progress Notes (Signed)
 Patient has no concerns

## 2024-01-20 NOTE — Progress Notes (Signed)
 [facility]  Telephone:(336) [phone] Fax:(336) [phone]  ID: Kathryn [name] OB: June 19, 1956  MR#: [id number]  AOZ#:[id number]  Patient Care Team: Leanord Asal, Nelva Bush, MD as PCP - General (Family Medicine)  CHIEF COMPLAINT: Pancytopenia.  INTERVAL HISTORY: Patient returns to clinic today for repeat laboratory work and further evaluation.  She currently feels well and is asymptomatic. She has had no further syncopal episodes.  She denies any weakness or fatigue.  She has no neurologic complaints.  She denies any recent fevers or illnesses.  She has a good appetite and denies weight loss.  She has no chest pain, shortness of breath, cough, or hemoptysis.  She denies any nausea, vomiting, constipation, or diarrhea.  She has no urinary complaints.  Patient offers no specific complaints today.  REVIEW OF SYSTEMS:   Review of Systems  Constitutional: Negative.  Negative for fever, malaise/fatigue and weight loss.  Respiratory: Negative.  Negative for cough, hemoptysis and shortness of breath.   Cardiovascular: Negative.  Negative for chest pain and leg swelling.  Gastrointestinal: Negative.  Negative for abdominal pain.  Genitourinary: Negative.  Negative for dysuria.  Musculoskeletal: Negative.  Negative for back pain.  Skin: Negative.  Negative for rash.  Neurological: Negative.  Negative for dizziness, focal weakness, seizures, weakness and headaches.  Psychiatric/Behavioral: Negative.  The patient is not nervous/anxious.     As per HPI. Otherwise, a complete review of systems is negative.  PAST MEDICAL HISTORY: Past Medical History:  Diagnosis Date   Alcoholic cirrhosis of liver (HCC)    Asthma    CHF (congestive heart failure) (HCC)    Chronic disease anemia    Esophageal varices (HCC)    GR I on EGD by Dr Shelle Iron 09/2014   ETOH abuse    Hypertension    Hypothyroidism    Murmur    Portal hypertension (HCC)    Sarcoidosis     PAST SURGICAL HISTORY: Past  Surgical History:  Procedure Laterality Date   ESOPHAGOGASTRODUODENOSCOPY N/A 06/26/2021   Procedure: ESOPHAGOGASTRODUODENOSCOPY (EGD);  Surgeon: Toledo, Boykin Nearing, MD;  Location: [facility] ENDOSCOPY;  Service: Gastroenterology;  Laterality: N/A;   ESOPHAGOGASTRODUODENOSCOPY (EGD) WITH PROPOFOL N/A 04/04/2015   Procedure: ESOPHAGOGASTRODUODENOSCOPY (EGD) WITH PROPOFOL;  Surgeon: Midge Minium, MD;  Location: [facility] ENDOSCOPY;  Service: Endoscopy;  Laterality: N/A;  Dr Servando Snare prefers 1445   ESOPHAGOGASTRODUODENOSCOPY (EGD) WITH PROPOFOL N/A 12/04/2022   Procedure: ESOPHAGOGASTRODUODENOSCOPY (EGD) WITH PROPOFOL;  Surgeon: [name], MD;  Location: [facility] ENDOSCOPY;  Service: Gastroenterology;  Laterality: N/A;   ESOPHAGOGASTRODUODENOSCOPY W/ BANDING  09/2014   Rein   TUBAL LIGATION      FAMILY HISTORY: Family History  Problem Relation Age of Onset   Aneurysm Father    COPD Father    Heart attack Mother     ADVANCED DIRECTIVES (Y/N):  N  HEALTH MAINTENANCE: Social History   Tobacco Use   Smoking status: Never   Smokeless tobacco: Never  Vaping Use   Vaping status: Never Used  Substance Use Topics   Alcohol use: Yes    Alcohol/week: 15.0 standard drinks of alcohol    Types: 15 Shots of liquor per week   Drug use: No     Colonoscopy:  PAP:  Bone density:  Lipid panel:  Allergies  Allergen Reactions   Hydrocodone-Acetaminophen Other (See Comments)    Reaction: pt can't take anything with tylenol due to her liver disease.    Ibuprofen Other (See Comments)    Esophageal varices   Naproxen Other (See  Comments)    Esophageal varices   Propoxyphene Other (See Comments)    Reaction: pt isn't sure, but knows she can't take it.    Shellfish Allergy Hives   Tamiflu  [Oseltamivir Phosphate] Other (See Comments)    Esophageal varices    Current Outpatient Medications  Medication Sig Dispense Refill   albuterol (VENTOLIN HFA) 108 (90 Base) MCG/ACT inhaler Inhale 1-2 puffs into the lungs  every 4 (four) hours as needed for wheezing or shortness of breath.     B Complex-C-Folic Acid (B COMPLEX-VITAMIN C-FOLIC ACID) 1 MG tablet Take 1 tablet by mouth daily with breakfast. 90 tablet 3   ferrous sulfate 325 (65 FE) MG tablet Take 1 tablet (325 mg total) by mouth daily with breakfast. 30 tablet 3   folic acid (FOLVITE) 1 MG tablet Take 1 tablet (1 mg total) by mouth daily. 30 tablet 2   hydrALAZINE (APRESOLINE) 25 MG tablet Take 1 tablet (25 mg total) by mouth every 8 (eight) hours. 90 tablet 2   hydrochlorothiazide (HYDRODIURIL) 25 MG tablet Take 25 mg by mouth daily.     levothyroxine (SYNTHROID) 125 MCG tablet Take 125 mcg by mouth daily.     Multiple Vitamin (MULTIVITAMIN) tablet Take 1 tablet by mouth every other day.     nadolol (CORGARD) 40 MG tablet Take 1 tablet (40 mg total) by mouth daily. 30 tablet 2   omeprazole (PRILOSEC) 40 MG capsule Take 40 mg by mouth 2 (two) times daily.     spironolactone (ALDACTONE) 50 MG tablet Take 50 mg by mouth daily.     thiamine 100 MG tablet Take 1 tablet (100 mg total) by mouth daily. 90 tablet 3   pantoprazole (PROTONIX) 40 MG tablet Take 1 tablet (40 mg total) by mouth 2 (two) times daily. (Patient not taking: Reported on 07/27/2023) 60 tablet 2   No current facility-administered medications for this visit.    OBJECTIVE: Vitals:   01/20/24 1023  BP: (!) 170/74  Pulse: 60  Resp: 20  Temp: 97.6 F (36.4 C)  SpO2: 100%     Body mass index is 32.24 kg/m.    ECOG FS:0 - Asymptomatic  General: Well-developed, well-nourished, no acute distress. Eyes: Pink conjunctiva, anicteric sclera. HEENT: Normocephalic, moist mucous membranes. Lungs: No audible wheezing or coughing. Heart: Regular rate and rhythm. Abdomen: Soft, nontender, no obvious distention. Musculoskeletal: No edema, cyanosis, or clubbing. Neuro: Alert, answering all questions appropriately. Cranial nerves grossly intact. Skin: No rashes or petechiae noted. Psych: Normal  affect.  LAB RESULTS:  Lab Results  Component Value Date   NA 142 11/19/2023   K 3.6 11/19/2023   CL 104 11/19/2023   CO2 23 11/19/2023   GLUCOSE 80 11/19/2023   BUN 14 11/19/2023   CREATININE 0.81 11/19/2023   CALCIUM 8.3 (L) 11/19/2023   PROT 8.0 11/19/2023   ALBUMIN 3.7 11/19/2023   AST 97 (H) 11/19/2023   ALT 29 11/19/2023   ALKPHOS 112 11/19/2023   BILITOT 1.0 11/19/2023   GFRNONAA >60 11/19/2023   GFRAA 48 (L) 04/14/2020    Lab Results  Component Value Date   WBC 3.9 (L) 01/20/2024   NEUTROABS 2.6 01/20/2024   HGB 10.1 (L) 01/20/2024   HCT 32.5 (L) 01/20/2024   MCV 91.5 01/20/2024   PLT 142 (L) 01/20/2024     STUDIES: No results found.   ASSESSMENT: Pancytopenia.  PLAN:    Leukopenia: Patient's total white blood cell count continues to trend up and is nearly  within normal limits at 3.9 today.  Previously, all of her other laboratory work  including iron stores, B12, folate, SPEP, and flow cytometry were either negative or within normal limits. 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  No intervention is needed at this time.  Patient does not require bone marrow biopsy.  Return to clinic in 3 months for further evaluation.  If patient's blood counts continue to improve, she likely can be discharged from clinic.   Anemia: Chronic and unchanged.  Patient's hemoglobin is 10.1 today.  Laboratory work as above.  No intervention is needed at this time.   Thrombocytopenia: Nearly resolved.  Patient's platelet count is 142.  CT scan of the abdomen and pelvis on January 24, 2023 did not reveal any splenomegaly.   Patient expressed understanding and was in agreement with this plan. She also understands that She can call clinic at any time with any questions, concerns, or complaints.    Jeralyn Ruths, MD   01/20/2024 11:17 AM

## 2024-02-17 ENCOUNTER — Other Ambulatory Visit: Payer: Self-pay

## 2024-02-17 ENCOUNTER — Inpatient Hospital Stay
Admission: EM | Admit: 2024-02-17 | Discharge: 2024-02-22 | DRG: 280 | Disposition: A | Discharge status: Home / Self Care | Attending: Student in an Organized Health Care Education/Training Program | Admitting: Student in an Organized Health Care Education/Training Program

## 2024-02-17 ENCOUNTER — Emergency Department

## 2024-02-17 DIAGNOSIS — I700 Atherosclerosis of aorta: Secondary | ICD-10-CM | POA: Diagnosis present

## 2024-02-17 DIAGNOSIS — Z9851 Tubal ligation status: Secondary | ICD-10-CM

## 2024-02-17 DIAGNOSIS — E876 Hypokalemia: Secondary | ICD-10-CM | POA: Diagnosis present

## 2024-02-17 DIAGNOSIS — Z9889 Other specified postprocedural states: Secondary | ICD-10-CM

## 2024-02-17 DIAGNOSIS — F1090 Alcohol use, unspecified, uncomplicated: Secondary | ICD-10-CM | POA: Diagnosis present

## 2024-02-17 DIAGNOSIS — Z886 Allergy status to analgesic agent status: Secondary | ICD-10-CM

## 2024-02-17 DIAGNOSIS — K7030 Alcoholic cirrhosis of liver without ascites: Secondary | ICD-10-CM

## 2024-02-17 DIAGNOSIS — F10229 Alcohol dependence with intoxication, unspecified: Secondary | ICD-10-CM | POA: Diagnosis present

## 2024-02-17 DIAGNOSIS — E872 Acidosis, unspecified: Secondary | ICD-10-CM | POA: Diagnosis present

## 2024-02-17 DIAGNOSIS — D61818 Other pancytopenia: Secondary | ICD-10-CM | POA: Diagnosis present

## 2024-02-17 DIAGNOSIS — E860 Dehydration: Secondary | ICD-10-CM | POA: Diagnosis present

## 2024-02-17 DIAGNOSIS — Z1152 Encounter for screening for COVID-19: Secondary | ICD-10-CM

## 2024-02-17 DIAGNOSIS — I959 Hypotension, unspecified: Secondary | ICD-10-CM | POA: Diagnosis present

## 2024-02-17 DIAGNOSIS — N183 Chronic kidney disease, stage 3 unspecified: Secondary | ICD-10-CM | POA: Diagnosis present

## 2024-02-17 DIAGNOSIS — D696 Thrombocytopenia, unspecified: Secondary | ICD-10-CM | POA: Diagnosis not present

## 2024-02-17 DIAGNOSIS — I214 Non-ST elevation (NSTEMI) myocardial infarction: Secondary | ICD-10-CM

## 2024-02-17 DIAGNOSIS — E162 Hypoglycemia, unspecified: Secondary | ICD-10-CM | POA: Diagnosis present

## 2024-02-17 DIAGNOSIS — I8510 Secondary esophageal varices without bleeding: Secondary | ICD-10-CM | POA: Diagnosis present

## 2024-02-17 DIAGNOSIS — Z8249 Family history of ischemic heart disease and other diseases of the circulatory system: Secondary | ICD-10-CM

## 2024-02-17 DIAGNOSIS — Z8616 Personal history of COVID-19: Secondary | ICD-10-CM

## 2024-02-17 DIAGNOSIS — D869 Sarcoidosis, unspecified: Secondary | ICD-10-CM | POA: Diagnosis present

## 2024-02-17 DIAGNOSIS — I2510 Atherosclerotic heart disease of native coronary artery without angina pectoris: Secondary | ICD-10-CM | POA: Diagnosis present

## 2024-02-17 DIAGNOSIS — Z5948 Other specified lack of adequate food: Secondary | ICD-10-CM

## 2024-02-17 DIAGNOSIS — Z79899 Other long term (current) drug therapy: Secondary | ICD-10-CM

## 2024-02-17 DIAGNOSIS — I8500 Esophageal varices without bleeding: Secondary | ICD-10-CM | POA: Diagnosis present

## 2024-02-17 DIAGNOSIS — Z7141 Alcohol abuse counseling and surveillance of alcoholic: Secondary | ICD-10-CM

## 2024-02-17 DIAGNOSIS — I81 Portal vein thrombosis: Secondary | ICD-10-CM | POA: Diagnosis present

## 2024-02-17 DIAGNOSIS — I129 Hypertensive chronic kidney disease with stage 1 through stage 4 chronic kidney disease, or unspecified chronic kidney disease: Secondary | ICD-10-CM | POA: Diagnosis present

## 2024-02-17 DIAGNOSIS — M549 Dorsalgia, unspecified: Secondary | ICD-10-CM | POA: Diagnosis not present

## 2024-02-17 DIAGNOSIS — R197 Diarrhea, unspecified: Secondary | ICD-10-CM | POA: Diagnosis present

## 2024-02-17 DIAGNOSIS — K449 Diaphragmatic hernia without obstruction or gangrene: Secondary | ICD-10-CM | POA: Diagnosis present

## 2024-02-17 DIAGNOSIS — K55059 Acute (reversible) ischemia of intestine, part and extent unspecified: Secondary | ICD-10-CM | POA: Diagnosis present

## 2024-02-17 DIAGNOSIS — Z86718 Personal history of other venous thrombosis and embolism: Secondary | ICD-10-CM

## 2024-02-17 DIAGNOSIS — I4891 Unspecified atrial fibrillation: Secondary | ICD-10-CM | POA: Diagnosis not present

## 2024-02-17 DIAGNOSIS — Z7982 Long term (current) use of aspirin: Secondary | ICD-10-CM

## 2024-02-17 DIAGNOSIS — Z888 Allergy status to other drugs, medicaments and biological substances status: Secondary | ICD-10-CM

## 2024-02-17 DIAGNOSIS — Z5941 Food insecurity: Secondary | ICD-10-CM

## 2024-02-17 DIAGNOSIS — I340 Nonrheumatic mitral (valve) insufficiency: Secondary | ICD-10-CM | POA: Diagnosis present

## 2024-02-17 DIAGNOSIS — K766 Portal hypertension: Secondary | ICD-10-CM | POA: Diagnosis present

## 2024-02-17 DIAGNOSIS — R079 Chest pain, unspecified: Secondary | ICD-10-CM

## 2024-02-17 DIAGNOSIS — E039 Hypothyroidism, unspecified: Secondary | ICD-10-CM | POA: Diagnosis present

## 2024-02-17 DIAGNOSIS — I10 Essential (primary) hypertension: Secondary | ICD-10-CM | POA: Diagnosis present

## 2024-02-17 DIAGNOSIS — Y907 Blood alcohol level of 200-239 mg/100 ml: Secondary | ICD-10-CM | POA: Diagnosis present

## 2024-02-17 DIAGNOSIS — E8729 Other acidosis: Secondary | ICD-10-CM | POA: Diagnosis present

## 2024-02-17 DIAGNOSIS — Z7901 Long term (current) use of anticoagulants: Secondary | ICD-10-CM

## 2024-02-17 DIAGNOSIS — K3189 Other diseases of stomach and duodenum: Secondary | ICD-10-CM | POA: Diagnosis present

## 2024-02-17 DIAGNOSIS — D638 Anemia in other chronic diseases classified elsewhere: Secondary | ICD-10-CM | POA: Diagnosis present

## 2024-02-17 DIAGNOSIS — Z91013 Allergy to seafood: Secondary | ICD-10-CM

## 2024-02-17 DIAGNOSIS — Z8701 Personal history of pneumonia (recurrent): Secondary | ICD-10-CM

## 2024-02-17 DIAGNOSIS — N1831 Chronic kidney disease, stage 3a: Secondary | ICD-10-CM | POA: Diagnosis present

## 2024-02-17 DIAGNOSIS — Z825 Family history of asthma and other chronic lower respiratory diseases: Secondary | ICD-10-CM

## 2024-02-17 DIAGNOSIS — Z789 Other specified health status: Secondary | ICD-10-CM

## 2024-02-17 DIAGNOSIS — N179 Acute kidney failure, unspecified: Secondary | ICD-10-CM

## 2024-02-17 DIAGNOSIS — I21A1 Myocardial infarction type 2: Secondary | ICD-10-CM | POA: Diagnosis present

## 2024-02-17 DIAGNOSIS — Z885 Allergy status to narcotic agent status: Secondary | ICD-10-CM

## 2024-02-17 DIAGNOSIS — I2489 Other forms of acute ischemic heart disease: Secondary | ICD-10-CM

## 2024-02-17 DIAGNOSIS — J45909 Unspecified asthma, uncomplicated: Secondary | ICD-10-CM | POA: Diagnosis present

## 2024-02-17 DIAGNOSIS — Z79890 Hormone replacement therapy (postmenopausal): Secondary | ICD-10-CM

## 2024-02-17 LAB — CBG MONITORING, ED
Glucose-Capillary: 182 mg/dL — ABNORMAL HIGH (ref 70–99)
Glucose-Capillary: 36 mg/dL — CL (ref 70–99)
Glucose-Capillary: 42 mg/dL — CL (ref 70–99)

## 2024-02-17 MED ORDER — FOLIC ACID 1 MG PO TABS
1.0000 mg | ORAL_TABLET | Freq: Every day | ORAL | Status: DC
  Administered 2024-02-18 – 2024-02-22 (×5): 1 mg via ORAL
  Filled 2024-02-17 (×5): qty 1

## 2024-02-17 MED ORDER — LORAZEPAM 2 MG/ML IJ SOLN: 1.0000 mg | INTRAMUSCULAR | Status: DC | PRN

## 2024-02-17 MED ORDER — DEXTROSE 50 % IV SOLN: 50.0000 mL | Freq: Once | INTRAVENOUS | Status: AC

## 2024-02-17 MED ORDER — THIAMINE HCL 100 MG/ML IJ SOLN
100.0000 mg | Freq: Every day | INTRAMUSCULAR | Status: DC
  Filled 2024-02-17: qty 2

## 2024-02-17 MED ORDER — DEXTROSE 50 % IV SOLN
INTRAVENOUS | Status: AC
  Administered 2024-02-17: 50 mL via INTRAVENOUS
  Filled 2024-02-17: qty 50

## 2024-02-17 MED ORDER — SODIUM CHLORIDE 0.9 % IV BOLUS
1000.0000 mL | Freq: Once | INTRAVENOUS | Status: AC
  Administered 2024-02-17: 1000 mL via INTRAVENOUS

## 2024-02-17 MED ORDER — THIAMINE MONONITRATE 100 MG PO TABS
100.0000 mg | ORAL_TABLET | Freq: Every day | ORAL | Status: DC
  Administered 2024-02-18 – 2024-02-22 (×5): 100 mg via ORAL
  Filled 2024-02-17 (×5): qty 1

## 2024-02-17 MED ORDER — LORAZEPAM 1 MG PO TABS: 1.0000 mg | ORAL_TABLET | ORAL | Status: DC | PRN

## 2024-02-17 MED ORDER — ADULT MULTIVITAMIN W/MINERALS CH
1.0000 | ORAL_TABLET | Freq: Every day | ORAL | Status: DC
Start: 2024-02-18 — End: 2024-02-18

## 2024-02-17 NOTE — ED Provider Notes (Incomplete)
 [facility] Provider Note    Event Date/Time   First MD Initiated Contact with Patient 02/17/24 2316     (approximate)   History   Irregular Heart Beat   HPI  Kathryn [name] is a 68 y.o. female   Past medical history of alcohol use, cirrhotic liver, esophageal varices, hypothyroid, portal vein thrombosis, pancytopenia who is here with "feeling lousy".  She describes chest tightness sensation mild shortness of breath all day today.  No cough congestion or respiratory infectious symptoms.  No nausea vomiting but does have some diarrhea which is brown she denies GI bleeding like blood or melena in the stools.  She denies abdominal pain.  EMS reports that upon getting to the home she was hypotensive in the 70s systolic and got 409 cc of fluid, was found to be atrial fibrillation with rates anywhere from 100-150.  She got aspirin as well for her chest pain.   Here she is awake alert oriented stated that she drank some alcohol earlier today and has been drinking daily.  She did not have much in the way of food or drink otherwise.  She has some mild chest tightness that wraps around to her back.  Mild shortness of breath.  She has no other acute medical complaints.  She says she used to be on blood thinners but no longer.  She has a history of portal vein thrombosis, and on discharge summary from September 2024 a notes that she has a history of portal vein thrombosis with extension into the [facility] on Eliquis but on her discharge medication list Eliquis is no longer listed.  Independent Historian contributed to assessment above: EMS gives report as above  External Medical Documents Reviewed: Discharge summary from 07/28/2023      Physical Exam   Triage Vital Signs: ED Triage Vitals  Encounter Vitals Group     BP 02/17/24 2316 (!) 133/109     Systolic BP Percentile --      Diastolic BP Percentile --      Pulse Rate 02/17/24 2316 (!) 133     Resp 02/17/24 2316 16      Temp 02/17/24 2316 (!) 96.7 F (35.9 C)     Temp Source 02/17/24 2316 Oral     SpO2 02/17/24 2316 97 %     Weight 02/17/24 2318 198 lb (89.8 kg)     Height 02/17/24 2318 5\' 4"  (1.626 m)     Head Circumference --      Peak Flow --      Pain Score 02/17/24 2317 8     Pain Loc --      Pain Education --      Exclude from Growth Chart --     Most recent vital signs: Vitals:   02/18/24 0330 02/18/24 0345  BP: 127/65   Pulse: (!) 110 (!) 107  Resp: 18 16  Temp:    SpO2: 100% 100%    General: Awake, no distress.  CV:  Good peripheral perfusion.  Resp:  Normal effort.  Abd:  No distention.  Other:  Awake alert cooperative.  Hypertensive 130s over 100.  Soft benign abdominal exam.  She looks dehydrated with very dry mucous membranes, poor skin turgor.  Her heart rate is irregular in the 130s.  Oxygenation is 97% on room air and her lungs are clear to auscultation bilaterally.  She has no significant peripheral edema.   ED Results / Procedures / Treatments   Labs (all  labs ordered are listed, but only abnormal results are displayed) Labs Reviewed  BASIC METABOLIC PANEL WITH GFR - Abnormal; Notable for the following components:      Result Value   Potassium 3.2 (*)    CO2 13 (*)    Glucose, Bld 198 (*)    Creatinine, Ser 1.27 (*)    Calcium 7.7 (*)    GFR, Estimated 46 (*)    Anion gap 21 (*)    All other components within normal limits  HEPATIC FUNCTION PANEL - Abnormal; Notable for the following components:   Albumin 2.8 (*)    All other components within normal limits  BRAIN NATRIURETIC PEPTIDE - Abnormal; Notable for the following components:   B Natriuretic Peptide 278.3 (*)    All other components within normal limits  CBC WITH DIFFERENTIAL/PLATELET - Abnormal; Notable for the following components:   WBC 3.7 (*)    Hemoglobin 10.7 (*)    MCHC 29.6 (*)    RDW 16.4 (*)    All other components within normal limits  ETHANOL - Abnormal; Notable for the following  components:   Alcohol, Ethyl (B) 219 (*)    All other components within normal limits  TSH - Abnormal; Notable for the following components:   TSH 9.815 (*)    All other components within normal limits  T4, FREE - Abnormal; Notable for the following components:   Free T4 0.51 (*)    All other components within normal limits  CBG MONITORING, ED - Abnormal; Notable for the following components:   Glucose-Capillary 36 (*)    All other components within normal limits  CBG MONITORING, ED - Abnormal; Notable for the following components:   Glucose-Capillary 42 (*)    All other components within normal limits  CBG MONITORING, ED - Abnormal; Notable for the following components:   Glucose-Capillary 182 (*)    All other components within normal limits  CBG MONITORING, ED - Abnormal; Notable for the following components:   Glucose-Capillary 112 (*)    All other components within normal limits  TROPONIN I (HIGH SENSITIVITY) - Abnormal; Notable for the following components:   Troponin I (High Sensitivity) 290 (*)    All other components within normal limits  TROPONIN I (HIGH SENSITIVITY) - Abnormal; Notable for the following components:   Troponin I (High Sensitivity) 804 (*)    All other components within normal limits  RESP PANEL BY RT-PCR (RSV, FLU A&B, COVID)  RVPGX2  LIPASE, BLOOD  MAGNESIUM  PHOSPHORUS  HEPARIN LEVEL (UNFRACTIONATED)     I ordered and reviewed the above labs they are notable for glucose is 36  EKG  ED ECG REPORT I, Pilar Jarvis, the attending physician, personally viewed and interpreted this ECG.   Date: 02/17/2024  EKG Time: 2316  Rate: 132  Rhythm: AF RVR  Axis: nl  Intervals:none  ST&T Change: ST depressions in the inf-lateral leads    RADIOLOGY I independently reviewed and interpreted chest x-ray and see no obvious focality pneumothorax I also reviewed radiologist's formal read.   PROCEDURES:  Critical Care performed: Yes, see critical care  procedure note(s)  .Critical Care  Performed by: Pilar Jarvis, MD Authorized by: Pilar Jarvis, MD   Critical care provider statement:    Critical care time (minutes):  50   Critical care was time spent personally by me on the following activities:  Development of treatment plan with patient or surrogate, discussions with consultants, evaluation of patient's response to treatment, examination of  patient, ordering and review of laboratory studies, ordering and review of radiographic studies, ordering and performing treatments and interventions, pulse oximetry, re-evaluation of patient's condition and review of old charts    MEDICATIONS ORDERED IN ED: Medications  LORazepam (ATIVAN) tablet 1-4 mg (has no administration in time range)    Or  LORazepam (ATIVAN) injection 1-4 mg (has no administration in time range)  thiamine (VITAMIN B1) tablet 100 mg (has no administration in time range)    Or  thiamine (VITAMIN B1) injection 100 mg (has no administration in time range)  folic acid (FOLVITE) tablet 1 mg (has no administration in time range)  multivitamin with minerals tablet 1 tablet (has no administration in time range)  heparin ADULT infusion 100 units/mL (25000 units/256mL) (900 Units/hr Intravenous New Bag/Given 02/18/24 0214)  diltiazem (CARDIZEM) tablet 60 mg (has no administration in time range)  sodium chloride 0.9 % bolus 1,000 mL (0 mLs Intravenous Stopped 02/18/24 0049)  dextrose 50 % solution 50 mL (50 mLs Intravenous Given 02/17/24 2329)  sodium chloride 0.9 % bolus 500 mL (0 mLs Intravenous Stopped 02/18/24 0157)  heparin bolus via infusion 4,000 Units (4,000 Units Intravenous Bolus from Bag 02/18/24 0214)  iohexol (OMNIPAQUE) 350 MG/ML injection 75 mL (75 mLs Intravenous Contrast Given 02/18/24 0135)  sodium chloride 0.9 % bolus 1,000 mL (0 mLs Intravenous Stopped 02/18/24 0324)  diltiazem (CARDIZEM) injection 10 mg (10 mg Intravenous Given 02/18/24 0309)  diltiazem (CARDIZEM)  injection 15 mg (15 mg Intravenous Given 02/18/24 0325)    External physician / consultants:  I spoke with hospital medicine for admission and regarding care plan for this patient.   IMPRESSION / MDM / ASSESSMENT AND PLAN / ED COURSE  I reviewed the triage vital signs and the nursing notes.                                Patient's presentation is most consistent with acute presentation with potential threat to life or bodily function.  Differential diagnosis includes, but is not limited to, atrial fibrillation with RVR, dehydration, electrolyte derangements, ACS, PE, thyroid dysfunction   The patient is on the cardiac monitor to evaluate for evidence of arrhythmia and/or significant heart rate changes.  MDM:    Patient with significant alcohol use here with new onset atrial fibrillation with RVR he looks markedly dehydrated and also some mild chest tightness/shortness of breath.  He had previously been on Eliquis due to portal vein thrombosis but no longer takes.  She looks markedly dehydrated and had initial hypotension but now better.  I will give her more fluids.  She does not look like she is in CHF exacerbation I think she can use a liter.  Her EKG does not show STEMI though does have some ST depressions in the inferolateral leads, will check troponins.  She already got aspirin.  Continue on cardiac monitoring.  Rate control by addressing dehydration first and pharmacologic rate control as needed.  Stable now does not need cardioversion.  I worried that she might have a blood clot.  Got a CT angiogram of the chest.  She does not look markedly intoxicated nor in withdrawal at this time.  Will continue to monitor, check ethanol, put on CIWA.  Check thyroid tsh/t4  -- She was hypoglycemic in the 30s upon initial evaluation I think due to poor nutritional intake and alcohol use, there was alert awake oriented pleasant and conversant throughout despite  this low blood sugar.  Will  give D50 and food continue to monitor.  -- Blood sugar improved.  Patient comfortable with still some mild chest discomfort.     Checked IVC and still flat we will give more fluids and monitor heart rate.    NSTEMI with a troponin leak in the 200s now 800s.  Started on heparin.    CT angiogram shows no blood clot.    Tachycardia improved down to the 100s to 110s.  Occasionally up to the 120s so got 10 mg, 15 mg of IV diltiazem and now sustained in the low 100s and normotensive.  I gave her an oral dose as well as 60 mg.  Repeat EKG still shows atrial fibrillation in the low 100s with no dynamic ischemic changes.        FINAL CLINICAL IMPRESSION(S) / ED DIAGNOSES   Final diagnoses:  Atrial fibrillation with RVR (HCC)  Nonspecific chest pain  Hypoglycemia  Alcohol use  Hiatal hernia  NSTEMI (non-ST elevated myocardial infarction) (HCC)     Rx / DC Orders   ED Discharge Orders     None        Note:  This document was prepared using Dragon voice recognition software and may include unintentional dictation errors.    Pilar Jarvis, MD 02/18/24 Donnal Debar    Pilar Jarvis, MD 02/18/24 [phone]

## 2024-02-17 NOTE — ED Triage Notes (Signed)
 To ER From home via EMS for report of chest pain that began around 10pm, wrapping around to the back. EMS reports new afib noted on monitor. Patient denies history. Reports history of alcohol abuse. Pressure on arrival for EMS 75/44 and received of fluid.

## 2024-02-18 ENCOUNTER — Emergency Department

## 2024-02-18 ENCOUNTER — Encounter: Payer: Self-pay | Admitting: Emergency Medicine

## 2024-02-18 DIAGNOSIS — I2489 Other forms of acute ischemic heart disease: Secondary | ICD-10-CM | POA: Diagnosis not present

## 2024-02-18 DIAGNOSIS — I81 Portal vein thrombosis: Secondary | ICD-10-CM | POA: Diagnosis present

## 2024-02-18 DIAGNOSIS — I214 Non-ST elevation (NSTEMI) myocardial infarction: Secondary | ICD-10-CM | POA: Diagnosis not present

## 2024-02-18 DIAGNOSIS — I8510 Secondary esophageal varices without bleeding: Secondary | ICD-10-CM | POA: Diagnosis present

## 2024-02-18 DIAGNOSIS — E039 Hypothyroidism, unspecified: Secondary | ICD-10-CM | POA: Diagnosis present

## 2024-02-18 DIAGNOSIS — Z1152 Encounter for screening for COVID-19: Secondary | ICD-10-CM | POA: Diagnosis not present

## 2024-02-18 DIAGNOSIS — E876 Hypokalemia: Secondary | ICD-10-CM | POA: Diagnosis present

## 2024-02-18 DIAGNOSIS — K7030 Alcoholic cirrhosis of liver without ascites: Secondary | ICD-10-CM

## 2024-02-18 DIAGNOSIS — I340 Nonrheumatic mitral (valve) insufficiency: Secondary | ICD-10-CM | POA: Diagnosis present

## 2024-02-18 DIAGNOSIS — D638 Anemia in other chronic diseases classified elsewhere: Secondary | ICD-10-CM | POA: Diagnosis present

## 2024-02-18 DIAGNOSIS — I21A1 Myocardial infarction type 2: Secondary | ICD-10-CM | POA: Diagnosis present

## 2024-02-18 DIAGNOSIS — K55059 Acute (reversible) ischemia of intestine, part and extent unspecified: Secondary | ICD-10-CM | POA: Diagnosis present

## 2024-02-18 DIAGNOSIS — E872 Acidosis, unspecified: Secondary | ICD-10-CM | POA: Diagnosis present

## 2024-02-18 DIAGNOSIS — I4891 Unspecified atrial fibrillation: Secondary | ICD-10-CM

## 2024-02-18 DIAGNOSIS — N179 Acute kidney failure, unspecified: Secondary | ICD-10-CM | POA: Diagnosis not present

## 2024-02-18 DIAGNOSIS — J45909 Unspecified asthma, uncomplicated: Secondary | ICD-10-CM | POA: Diagnosis present

## 2024-02-18 DIAGNOSIS — E860 Dehydration: Secondary | ICD-10-CM | POA: Diagnosis present

## 2024-02-18 DIAGNOSIS — I2510 Atherosclerotic heart disease of native coronary artery without angina pectoris: Secondary | ICD-10-CM | POA: Diagnosis not present

## 2024-02-18 DIAGNOSIS — F1090 Alcohol use, unspecified, uncomplicated: Secondary | ICD-10-CM | POA: Diagnosis not present

## 2024-02-18 DIAGNOSIS — D869 Sarcoidosis, unspecified: Secondary | ICD-10-CM | POA: Diagnosis present

## 2024-02-18 DIAGNOSIS — D61818 Other pancytopenia: Secondary | ICD-10-CM | POA: Diagnosis present

## 2024-02-18 DIAGNOSIS — K449 Diaphragmatic hernia without obstruction or gangrene: Secondary | ICD-10-CM | POA: Diagnosis present

## 2024-02-18 DIAGNOSIS — F10229 Alcohol dependence with intoxication, unspecified: Secondary | ICD-10-CM | POA: Diagnosis present

## 2024-02-18 DIAGNOSIS — K766 Portal hypertension: Secondary | ICD-10-CM | POA: Diagnosis present

## 2024-02-18 DIAGNOSIS — I129 Hypertensive chronic kidney disease with stage 1 through stage 4 chronic kidney disease, or unspecified chronic kidney disease: Secondary | ICD-10-CM | POA: Diagnosis present

## 2024-02-18 DIAGNOSIS — Y907 Blood alcohol level of 200-239 mg/100 ml: Secondary | ICD-10-CM | POA: Diagnosis present

## 2024-02-18 DIAGNOSIS — Z8616 Personal history of COVID-19: Secondary | ICD-10-CM | POA: Diagnosis not present

## 2024-02-18 DIAGNOSIS — D696 Thrombocytopenia, unspecified: Secondary | ICD-10-CM | POA: Diagnosis not present

## 2024-02-18 DIAGNOSIS — I700 Atherosclerosis of aorta: Secondary | ICD-10-CM | POA: Diagnosis present

## 2024-02-18 DIAGNOSIS — N1831 Chronic kidney disease, stage 3a: Secondary | ICD-10-CM | POA: Diagnosis present

## 2024-02-18 LAB — HEPATIC FUNCTION PANEL
ALT: 13 U/L (ref 0–44)
AST: 35 U/L (ref 15–41)
Albumin: 2.8 g/dL — ABNORMAL LOW (ref 3.5–5.0)
Alkaline Phosphatase: 58 U/L (ref 38–126)
Bilirubin, Direct: 0.2 mg/dL (ref 0.0–0.2)
Indirect Bilirubin: 0.8 mg/dL (ref 0.3–0.9)
Total Bilirubin: 1 mg/dL (ref 0.0–1.2)
Total Protein: 6.7 g/dL (ref 6.5–8.1)

## 2024-02-18 LAB — CBC WITH DIFFERENTIAL/PLATELET
Abs Immature Granulocytes: 0.02 10*3/uL (ref 0.00–0.07)
Basophils Absolute: 0.1 10*3/uL (ref 0.0–0.1)
Basophils Relative: 2 %
Eosinophils Absolute: 0.2 10*3/uL (ref 0.0–0.5)
Eosinophils Relative: 5 %
HCT: 36.1 % (ref 36.0–46.0)
Hemoglobin: 10.7 g/dL — ABNORMAL LOW (ref 12.0–15.0)
Immature Granulocytes: 1 %
Lymphocytes Relative: 21 %
Lymphs Abs: 0.8 10*3/uL (ref 0.7–4.0)
MCH: 27.6 pg (ref 26.0–34.0)
MCHC: 29.6 g/dL — ABNORMAL LOW (ref 30.0–36.0)
MCV: 93.3 fL (ref 80.0–100.0)
Monocytes Absolute: 0.3 10*3/uL (ref 0.1–1.0)
Monocytes Relative: 9 %
Neutro Abs: 2.4 10*3/uL (ref 1.7–7.7)
Neutrophils Relative %: 62 %
Platelets: 253 10*3/uL (ref 150–400)
RBC: 3.87 MIL/uL (ref 3.87–5.11)
RDW: 16.4 % — ABNORMAL HIGH (ref 11.5–15.5)
WBC: 3.7 10*3/uL — ABNORMAL LOW (ref 4.0–10.5)
nRBC: 0 % (ref 0.0–0.2)

## 2024-02-18 LAB — HIV ANTIBODY (ROUTINE TESTING W REFLEX): HIV Screen 4th Generation wRfx: NONREACTIVE

## 2024-02-18 LAB — RESP PANEL BY RT-PCR (RSV, FLU A&B, COVID)  RVPGX2
Influenza A by PCR: NEGATIVE
Influenza B by PCR: NEGATIVE
Resp Syncytial Virus by PCR: NEGATIVE
SARS Coronavirus 2 by RT PCR: NEGATIVE

## 2024-02-18 LAB — BASIC METABOLIC PANEL WITH GFR
Anion gap: 21 — ABNORMAL HIGH (ref 5–15)
Anion gap: 15 (ref 5–15)
BUN: 19 mg/dL (ref 8–23)
BUN: 16 mg/dL (ref 8–23)
CO2: 13 mmol/L — ABNORMAL LOW (ref 22–32)
CO2: 17 mmol/L — ABNORMAL LOW (ref 22–32)
Calcium: 7.7 mg/dL — ABNORMAL LOW (ref 8.9–10.3)
Calcium: 7.7 mg/dL — ABNORMAL LOW (ref 8.9–10.3)
Chloride: 104 mmol/L (ref 98–111)
Chloride: 102 mmol/L (ref 98–111)
Creatinine, Ser: 1.27 mg/dL — ABNORMAL HIGH (ref 0.44–1.00)
Creatinine, Ser: 0.94 mg/dL (ref 0.44–1.00)
GFR, Estimated: 46 mL/min — ABNORMAL LOW (ref >60)
GFR, Estimated: >60 mL/min (ref >60)
Glucose, Bld: 198 mg/dL — ABNORMAL HIGH (ref 70–99)
Glucose, Bld: 143 mg/dL — ABNORMAL HIGH (ref 70–99)
Potassium: 3.2 mmol/L — ABNORMAL LOW (ref 3.5–5.1)
Potassium: 3.8 mmol/L (ref 3.5–5.1)
Sodium: 138 mmol/L (ref 135–145)
Sodium: 134 mmol/L — ABNORMAL LOW (ref 135–145)

## 2024-02-18 LAB — CBG MONITORING, ED
Glucose-Capillary: 112 mg/dL — ABNORMAL HIGH (ref 70–99)
Glucose-Capillary: 71 mg/dL (ref 70–99)
Glucose-Capillary: 85 mg/dL (ref 70–99)
Glucose-Capillary: 74 mg/dL (ref 70–99)
Glucose-Capillary: 79 mg/dL (ref 70–99)
Glucose-Capillary: 140 mg/dL — ABNORMAL HIGH (ref 70–99)
Glucose-Capillary: 128 mg/dL — ABNORMAL HIGH (ref 70–99)

## 2024-02-18 LAB — PHOSPHORUS: Phosphorus: 4 mg/dL (ref 2.5–4.6)

## 2024-02-18 LAB — TROPONIN I (HIGH SENSITIVITY)
Troponin I (High Sensitivity): 290 ng/L (ref <18)
Troponin I (High Sensitivity): 804 ng/L (ref <18)
Troponin I (High Sensitivity): 3476 ng/L (ref <18)

## 2024-02-18 LAB — HEPARIN LEVEL (UNFRACTIONATED)
Heparin Unfractionated: >1.1 [IU]/mL — ABNORMAL HIGH (ref 0.30–0.70)
Heparin Unfractionated: 0.55 [IU]/mL (ref 0.30–0.70)
Heparin Unfractionated: 0.45 [IU]/mL (ref 0.30–0.70)

## 2024-02-18 LAB — TSH: TSH: 9.815 u[IU]/mL — ABNORMAL HIGH (ref 0.350–4.500)

## 2024-02-18 LAB — T4, FREE: Free T4: 0.51 ng/dL — ABNORMAL LOW (ref 0.61–1.12)

## 2024-02-18 LAB — MAGNESIUM: Magnesium: 1.8 mg/dL (ref 1.7–2.4)

## 2024-02-18 LAB — LIPASE, BLOOD: Lipase: 29 U/L (ref 11–51)

## 2024-02-18 LAB — BRAIN NATRIURETIC PEPTIDE: B Natriuretic Peptide: 278.3 pg/mL — ABNORMAL HIGH (ref 0.0–100.0)

## 2024-02-18 LAB — ETHANOL: Alcohol, Ethyl (B): 219 mg/dL — ABNORMAL HIGH (ref <10)

## 2024-02-18 MED ORDER — DILTIAZEM HCL 30 MG PO TABS
30.0000 mg | ORAL_TABLET | Freq: Four times a day (QID) | ORAL | Status: DC
  Administered 2024-02-18 – 2024-02-22 (×13): 30 mg via ORAL
  Filled 2024-02-18 (×13): qty 1

## 2024-02-18 MED ORDER — THIAMINE HCL 100 MG/ML IJ SOLN: 100.0000 mg | Freq: Every day | INTRAMUSCULAR | Status: DC

## 2024-02-18 MED ORDER — DILTIAZEM HCL 60 MG PO TABS
60.0000 mg | ORAL_TABLET | Freq: Once | ORAL | Status: AC
  Administered 2024-02-18: 60 mg via ORAL
  Filled 2024-02-18: qty 1

## 2024-02-18 MED ORDER — LORAZEPAM 1 MG PO TABS
1.0000 mg | ORAL_TABLET | ORAL | Status: AC | PRN
  Administered 2024-02-19: 1 mg via ORAL
  Filled 2024-02-18: qty 1

## 2024-02-18 MED ORDER — NITROGLYCERIN 0.4 MG SL SUBL
0.4000 mg | SUBLINGUAL_TABLET | SUBLINGUAL | Status: DC | PRN
  Administered 2024-02-19: 0.4 mg via SUBLINGUAL
  Filled 2024-02-18: qty 1

## 2024-02-18 MED ORDER — PANTOPRAZOLE SODIUM 40 MG PO TBEC
40.0000 mg | DELAYED_RELEASE_TABLET | Freq: Two times a day (BID) | ORAL | Status: DC
  Administered 2024-02-18 – 2024-02-22 (×9): 40 mg via ORAL
  Filled 2024-02-18 (×9): qty 1

## 2024-02-18 MED ORDER — ASPIRIN 81 MG PO TBEC
81.0000 mg | DELAYED_RELEASE_TABLET | Freq: Every day | ORAL | Status: DC
  Administered 2024-02-19 – 2024-02-20 (×2): 81 mg via ORAL
  Filled 2024-02-18 (×3): qty 1

## 2024-02-18 MED ORDER — SODIUM CHLORIDE 0.9 % IV BOLUS
500.0000 mL | Freq: Once | INTRAVENOUS | Status: AC
  Administered 2024-02-18: 500 mL via INTRAVENOUS

## 2024-02-18 MED ORDER — ADULT MULTIVITAMIN W/MINERALS CH
1.0000 | ORAL_TABLET | Freq: Every day | ORAL | Status: DC
  Administered 2024-02-18 – 2024-02-22 (×5): 1 via ORAL
  Filled 2024-02-18 (×5): qty 1

## 2024-02-18 MED ORDER — DILTIAZEM HCL 25 MG/5ML IV SOLN
15.0000 mg | Freq: Once | INTRAVENOUS | Status: AC
  Administered 2024-02-18: 15 mg via INTRAVENOUS

## 2024-02-18 MED ORDER — FOLIC ACID 1 MG PO TABS: 1.0000 mg | ORAL_TABLET | Freq: Every day | ORAL | Status: DC

## 2024-02-18 MED ORDER — HEPARIN (PORCINE) 25000 UT/250ML-% IV SOLN
900.0000 [IU]/h | INTRAVENOUS | Status: DC
  Administered 2024-02-18 – 2024-02-21 (×4): 900 [IU]/h via INTRAVENOUS
  Filled 2024-02-18 (×4): qty 250

## 2024-02-18 MED ORDER — ONDANSETRON HCL 4 MG/2ML IJ SOLN
4.0000 mg | Freq: Four times a day (QID) | INTRAMUSCULAR | Status: DC | PRN
  Administered 2024-02-19 (×2): 4 mg via INTRAVENOUS
  Filled 2024-02-18 (×2): qty 2

## 2024-02-18 MED ORDER — DILTIAZEM HCL 25 MG/5ML IV SOLN
10.0000 mg | Freq: Once | INTRAVENOUS | Status: AC
  Administered 2024-02-18: 10 mg via INTRAVENOUS
  Filled 2024-02-18: qty 5

## 2024-02-18 MED ORDER — HEPARIN BOLUS VIA INFUSION
4000.0000 [IU] | Freq: Once | INTRAVENOUS | Status: AC
  Administered 2024-02-18: 4000 [IU] via INTRAVENOUS
  Filled 2024-02-18: qty 4000

## 2024-02-18 MED ORDER — LORAZEPAM 2 MG/ML IJ SOLN: 1.0000 mg | INTRAMUSCULAR | Status: AC | PRN

## 2024-02-18 MED ORDER — POTASSIUM CHLORIDE CRYS ER 20 MEQ PO TBCR
40.0000 meq | EXTENDED_RELEASE_TABLET | Freq: Once | ORAL | Status: AC
Start: 2024-02-18 — End: 2024-02-18
  Administered 2024-02-18: 40 meq via ORAL
  Filled 2024-02-18: qty 2

## 2024-02-18 MED ORDER — SODIUM CHLORIDE 0.9 % IV BOLUS
1000.0000 mL | Freq: Once | INTRAVENOUS | Status: AC
  Administered 2024-02-18: 1000 mL via INTRAVENOUS

## 2024-02-18 MED ORDER — LEVOTHYROXINE SODIUM 25 MCG PO TABS
125.0000 ug | ORAL_TABLET | Freq: Every day | ORAL | Status: DC
  Administered 2024-02-18 – 2024-02-22 (×5): 125 ug via ORAL
  Filled 2024-02-18: qty 3
  Filled 2024-02-18 (×4): qty 1

## 2024-02-18 MED ORDER — ACETAMINOPHEN 325 MG PO TABS
650.0000 mg | ORAL_TABLET | ORAL | Status: DC | PRN
  Administered 2024-02-18 – 2024-02-19 (×2): 650 mg via ORAL
  Filled 2024-02-18 (×2): qty 2

## 2024-02-18 MED ORDER — THIAMINE MONONITRATE 100 MG PO TABS: 100.0000 mg | ORAL_TABLET | Freq: Every day | ORAL | Status: DC

## 2024-02-18 MED ORDER — IOHEXOL 350 MG/ML SOLN
75.0000 mL | Freq: Once | INTRAVENOUS | Status: AC | PRN
  Administered 2024-02-18: 75 mL via INTRAVENOUS

## 2024-02-18 MED ORDER — DEXTROSE-SODIUM CHLORIDE 5-0.9 % IV SOLN: INTRAVENOUS | Status: DC

## 2024-02-18 MED ORDER — NADOLOL 40 MG PO TABS
40.0000 mg | ORAL_TABLET | Freq: Every day | ORAL | Status: DC
  Administered 2024-02-18 – 2024-02-21 (×4): 40 mg via ORAL
  Filled 2024-02-18 (×6): qty 1

## 2024-02-18 NOTE — Assessment & Plan Note (Addendum)
 Portal vein thrombosis Esophageal varices Continue nadolol and spironolactone Patient denies vomiting, blood in stool or melena

## 2024-02-18 NOTE — Assessment & Plan Note (Signed)
 Alcohol intoxication EtOH level over 200 Counseled on cutting back-3 drinks of liquor daily  CIWA withdrawal protocol

## 2024-02-18 NOTE — Consult Note (Addendum)
 Cardiology Consultation   Patient ID: Kathryn [name] MRN: [id number]; DOB: 30-Mar-1956  Admit date: 02/17/2024 Date of Consult: 02/18/2024  PCP:  Norval Morton, MD   [facility] Providers Cardiologist:  None      New consult completed by Dr Kirke Corin  Patient Profile:   Kathryn [name] is a 68 y.o. female with a hx of asthma, hypertension, aortic atherosclerosis, hypothyroidism, alcoholism, cirrhosis, portal hypertension, esophageal varices, sarcoidosis, who is being seen 02/18/2024 for the evaluation of new onset atrial fibrillation and concerns for NSTEMI at the request of Dr. Para March.  History of Present Illness:   Kathryn [name] presented to the [facility] emergency department on 02/17/2024 with chest tightness, shortness of breath and an irregular heart rhythm.  She stated that she had been feeling lousy for several days.  She had no cough congestion or respiratory symptoms.  No nausea or vomiting but did have some diarrhea.  She denies any GI bleeding or blood noted in her urine or stool.  Per EMS reports that upon getting to her home she was hypotensive with a blood pressure 70 systolic and got 409 cc of IV fluid she was found to be in atrial fibrillation with rates ranging from 100 250 bpm.  She complained of chest tightness and discomfort and was given aspirin.  When she arrived into the emergency department she continued to have some mild chest tightness that wraps around to her back with mild shortness of breath.  Initial vital signs: Blood pressure 133/29, pulse 133, respirations 16, temperature 96.7  Pertinent labs: Potassium 3.2, CO2 of 13, blood glucose of 198, serum creatinine 1.27, calcium of 7.7, albumin of 2.8, BNP 278.3, WBCs of 3.7, ethanol alcohol 219, TSH 9.815, free T4 of 0.51, blood glucose 3642, high-sensitivity troponin 290 and 804, flu A/B, COVID, and RSV negative by PCR  Imaging: Chest x-ray reveals no active disease; CTA of the chest revealed no evidence of  significant pulmonary embolus, esophageal hiatal hernia with fluid and infiltration in the periesophageal fat probably representing fat necrosis (no change), nodular liver contour suggesting cirrhosis with mildly enlarged spleen, cholelithiasis  Medications administered in the emergency department: Diltiazem bolus of 10 mg IVP and 15 mg IVP, normal saline 2 L, heparin infusion  Cardiology consulted for concerns of NSTEMI and new onset atrial fibrillation  Past Medical History:  Diagnosis Date   Alcoholic cirrhosis of liver (HCC)    Asthma    CHF (congestive heart failure) (HCC)    Chronic disease anemia    Esophageal varices (HCC)    GR I on EGD by Dr Shelle Iron 09/2014   ETOH abuse    Hypertension    Hypothyroidism    Murmur    Portal hypertension (HCC)    Sarcoidosis     Past Surgical History:  Procedure Laterality Date   ESOPHAGOGASTRODUODENOSCOPY N/A 06/26/2021   Procedure: ESOPHAGOGASTRODUODENOSCOPY (EGD);  Surgeon: Toledo, Boykin Nearing, MD;  Location: [facility] ENDOSCOPY;  Service: Gastroenterology;  Laterality: N/A;   ESOPHAGOGASTRODUODENOSCOPY (EGD) WITH PROPOFOL N/A 04/04/2015   Procedure: ESOPHAGOGASTRODUODENOSCOPY (EGD) WITH PROPOFOL;  Surgeon: Midge Minium, MD;  Location: [facility] ENDOSCOPY;  Service: Endoscopy;  Laterality: N/A;  Dr Servando Snare prefers 1445   ESOPHAGOGASTRODUODENOSCOPY (EGD) WITH PROPOFOL N/A 12/04/2022   Procedure: ESOPHAGOGASTRODUODENOSCOPY (EGD) WITH PROPOFOL;  Surgeon: [name], MD;  Location: [facility] ENDOSCOPY;  Service: Gastroenterology;  Laterality: N/A;   ESOPHAGOGASTRODUODENOSCOPY W/ BANDING  09/2014   Rein   TUBAL LIGATION       Home Medications:  Prior to Admission medications   Medication Sig Start Date End Date Taking? Authorizing Provider  albuterol (VENTOLIN HFA) 108 (90 Base) MCG/ACT inhaler Inhale 1-2 puffs into the lungs every 4 (four) hours as needed for wheezing or shortness of breath. 06/14/23   [provider]  B Complex-C-Folic Acid (B  COMPLEX-VITAMIN C-FOLIC ACID) 1 MG tablet Take 1 tablet by mouth daily with breakfast. 12/05/22   Enedina Finner, MD  ferrous sulfate 325 (65 FE) MG tablet Take 1 tablet (325 mg total) by mouth daily with breakfast. 12/05/22   Enedina Finner, MD  folic acid (FOLVITE) 1 MG tablet Take 1 tablet (1 mg total) by mouth daily. 12/05/22   Enedina Finner, MD  hydrALAZINE (APRESOLINE) 25 MG tablet Take 1 tablet (25 mg total) by mouth every 8 (eight) hours. 07/28/23   Arnetha Courser, MD  hydrochlorothiazide (HYDRODIURIL) 25 MG tablet Take 25 mg by mouth daily. 06/14/23   [provider]  levothyroxine (SYNTHROID) 125 MCG tablet Take 125 mcg by mouth daily. 05/04/23   [provider]  Multiple Vitamin (MULTIVITAMIN) tablet Take 1 tablet by mouth every other day.    [provider]  nadolol (CORGARD) 40 MG tablet Take 1 tablet (40 mg total) by mouth daily. 12/05/22   Enedina Finner, MD  omeprazole (PRILOSEC) 40 MG capsule Take 40 mg by mouth 2 (two) times daily. 04/22/23   [provider]  pantoprazole (PROTONIX) 40 MG tablet Take 1 tablet (40 mg total) by mouth 2 (two) times daily. Patient not taking: Reported on 07/27/2023 12/05/22   Enedina Finner, MD  spironolactone (ALDACTONE) 50 MG tablet Take 50 mg by mouth daily. 03/12/23   [provider]  thiamine 100 MG tablet Take 1 tablet (100 mg total) by mouth daily. 04/07/22   Ward, Layla Maw, DO    Inpatient Medications: Scheduled Meds:  [START ON 02/19/2024] aspirin EC  81 mg Oral Daily   diltiazem  30 mg Oral Q6H   folic acid  1 mg Oral Daily   levothyroxine  125 mcg Oral Q0600   multivitamin with minerals  1 tablet Oral Daily   nadolol  40 mg Oral Daily   pantoprazole  40 mg Oral BID   thiamine  100 mg Oral Daily   Or   thiamine  100 mg Intravenous Daily   Continuous Infusions:  dextrose 5 % and 0.9 % NaCl 75 mL/hr at 02/18/24 0546   heparin 900 Units/hr (02/18/24 0214)   PRN Meds: acetaminophen, LORazepam **OR** LORazepam,  nitroGLYCERIN, ondansetron (ZOFRAN) IV  Allergies:    Allergies  Allergen Reactions   Oxycodone-Acetaminophen    Hydrocodone-Acetaminophen Other (See Comments)    Reaction: pt can't take anything with tylenol due to her liver disease.    Ibuprofen Other (See Comments)    Esophageal varices   Naproxen Other (See Comments)    Esophageal varices   Propoxyphene Other (See Comments)    Reaction: pt isn't sure, but knows she can't take it.    Shellfish Allergy Hives   Tamiflu  [Oseltamivir Phosphate] Other (See Comments)    Esophageal varices    Social History:   Social History   Socioeconomic History   Marital status: Divorced    Spouse name: Not on file   Number of children: Not on file   Years of education: Not on file   Highest education level: Not on file  Occupational History   Not on file  Tobacco Use   Smoking status: Never  Smokeless tobacco: Never  Vaping Use   Vaping status: Never Used  Substance and Sexual Activity   Alcohol use: Yes    Alcohol/week: 15.0 standard drinks of alcohol    Types: 15 Shots of liquor per week   Drug use: No   Sexual activity: Not Currently  Other Topics Concern   Not on file  Social History Narrative   She lives with 2 grandsons   Social Drivers of Health   Financial Resource Strain: Not on file  Food Insecurity: Food Insecurity Present (12/08/2023)   Hunger Vital Sign    Worried About Running Out of Food in the Last Year: Sometimes true    Ran Out of Food in the Last Year: Sometimes true  Transportation Needs: No Transportation Needs (12/08/2023)   PRAPARE - Administrator, Civil Service (Medical): No    Lack of Transportation (Non-Medical): No  Physical Activity: Not on file  Stress: Not on file  Social Connections: Not on file  Intimate Partner Violence: Not At Risk (12/08/2023)   Humiliation, Afraid, Rape, and Kick questionnaire    Fear of Current or Ex-Partner: No    Emotionally Abused: No    Physically  Abused: No    Sexually Abused: No    Family History:    Family History  Problem Relation Age of Onset   Aneurysm Father    COPD Father    Heart attack Mother      ROS:  Please see the history of present illness.  Review of Systems  Constitutional:  Positive for malaise/fatigue.  Respiratory:  Positive for shortness of breath.   Cardiovascular:  Positive for chest pain.  Gastrointestinal:  Positive for diarrhea.  Neurological:  Positive for weakness.    All other ROS reviewed and negative.     Physical Exam/Data:   Vitals:   02/18/24 0500 02/18/24 0600 02/18/24 0700 02/18/24 0800  BP: 137/81 130/75 (!) 141/85 133/77  Pulse: (!) 110 (!) 110 (!) 110 (!) 111  Resp: 16 15 13 16   Temp:  97.7 F (36.5 C)    TempSrc:  Oral    SpO2: 100% 100% 100% 100%  Weight:      Height:        Intake/Output Summary (Last 24 hours) at 02/18/2024 0828 Last data filed at 02/18/2024 0324 Gross per 24 hour  Intake 2000 ml  Output --  Net 2000 ml      02/17/2024   11:18 PM 01/20/2024   10:23 AM 12/08/2023   11:18 AM  Last 3 Weights  Weight (lbs) 198 lb 187 lb 12.8 oz 177 lb  Weight (kg) 89.812 kg 85.186 kg 80.287 kg     Body mass index is 33.99 kg/m.  General:  Well nourished, well developed, in no acute distress HEENT: normal Neck: no JVD Vascular: No carotid bruits; Distal pulses 2+ bilaterally Cardiac:  normal S1, S2; IR IR; I/VI systolic murmur RUSB Lungs:  clear with diminished bases to auscultation bilaterally, no wheezing, rhonchi or rales, respirations are unlabored at rest on room air Abd: mildly distended, nontender, no hepatomegaly  Ext: no edema Musculoskeletal:  No deformities, BUE and BLE strength normal and equal Skin: warm and dry  Neuro:  CNs 2-12 intact, no focal abnormalities noted Psych:  Normal affect   EKG:  The EKG was personally reviewed and demonstrates: Atrial fibrillation with RVR rate of 153, LVH, early repolarization, ST depression in V5 and  V6 Telemetry:  Telemetry was personally reviewed and demonstrates:  Atrial fibrillation that is rate controlled around 110  Relevant CV Studies: 2D echo 01/04/2022 1. Mild intracavitary gradient with no significant change with Valsalva.  Peak velocity 1.2 m/s. Peak gradient 5 mmHg. Left ventricular ejection  fraction, by estimation, is 65 to 70%. The left ventricle has normal  function. There is moderate asymmetric  left ventricular hypertrophy of the basal-septal segment. Left ventricular  diastolic parameters were normal.   2. Trivial mitral valve regurgitation.   3. The aortic valve is tricuspid. Aortic valve regurgitation is mild.   4. There is Moderate (Grade III) atheroma plaque involving the ascending  aorta.   5. There is normal pulmonary artery systolic pressure.   Echo 06/2021:  1. Left ventricular ejection fraction, by estimation, is 60 to 65%. The  left ventricle has normal function. The left ventricle has no regional  wall motion abnormalities. There is moderate left ventricular hypertrophy.  Left ventricular diastolic  parameters are consistent with Grade I diastolic dysfunction (impaired  relaxation).   2. Right ventricular systolic function is normal. The right ventricular  size is normal.   3. Left atrial size was moderately dilated.   4. The mitral valve is normal in structure. Mild to moderate mitral valve  regurgitation.   Laboratory Data:  High Sensitivity Troponin:   Recent Labs  Lab 02/17/24 2331 02/18/24 0158  TROPONINIHS 290* 804*     Chemistry Recent Labs  Lab 02/17/24 2331  NA 138  K 3.2*  CL 104  CO2 13*  GLUCOSE 198*  BUN 19  CREATININE 1.27*  CALCIUM 7.7*  MG 1.8  GFRNONAA 46*  ANIONGAP 21*    Recent Labs  Lab 02/17/24 2331  PROT 6.7  ALBUMIN 2.8*  AST 35  ALT 13  ALKPHOS 58  BILITOT 1.0   Lipids No results for input(s): "CHOL", "TRIG", "HDL", "LABVLDL", "LDLCALC", "CHOLHDL" in the last 168 hours.  Hematology Recent Labs   Lab 02/17/24 2331  WBC 3.7*  RBC 3.87  HGB 10.7*  HCT 36.1  MCV 93.3  MCH 27.6  MCHC 29.6*  RDW 16.4*  PLT 253   Thyroid  Recent Labs  Lab 02/17/24 2331  TSH 9.815*  FREET4 0.51*    BNP Recent Labs  Lab 02/17/24 2331  BNP 278.3*    DDimer No results for input(s): "DDIMER" in the last 168 hours.   Radiology/Studies:  CT Angio Chest PE W/Cm &/Or Wo Cm Result Date: 02/18/2024 CLINICAL DATA:  Pulmonary embolus suspected with high probability. Chest pain beginning around 10 o'clock extending to the back. New atrial fibrillation. EXAM: CT ANGIOGRAPHY CHEST WITH CONTRAST TECHNIQUE: Multidetector CT imaging of the chest was performed using the standard protocol during bolus administration of intravenous contrast. Multiplanar CT image reconstructions and MIPs were obtained to evaluate the vascular anatomy. RADIATION DOSE REDUCTION: This exam was performed according to the departmental dose-optimization program which includes automated exposure control, adjustment of the mA and/or kV according to patient size and/or use of iterative reconstruction technique. CONTRAST:  75mL OMNIPAQUE IOHEXOL 350 MG/ML SOLN COMPARISON:  Chest radiograph 02/17/2024. CTA chest abdomen and pelvis 12/02/2022 FINDINGS: Cardiovascular: Technically adequate study with good opacification of the central and segmental pulmonary arteries. Mild motion artifact. No focal filling defects are demonstrated. No evidence of significant pulmonary embolus. Mild cardiac enlargement. No pericardial effusions. Normal caliber thoracic aorta. Calcification of the aorta and coronary arteries. Mediastinum/Nodes: Esophagus is decompressed. No significant lymphadenopathy in the chest. Small esophageal hiatal hernia containing fat with paraesophageal soft tissue possibly representing  fat necrosis related to the hernia. No change since prior study. Lungs/Pleura: Emphysematous changes in the lungs. Slight fibrosis in the lung bases. No  airspace disease or consolidation. No pleural effusion or pneumothorax. Upper Abdomen: Nodular liver contour suggesting cirrhosis. Spleen is mildly enlarged. Cholelithiasis. No inflammatory changes. Musculoskeletal: No chest wall abnormality. No acute or significant osseous findings. Review of the MIP images confirms the above findings. IMPRESSION: 1. No evidence of significant pulmonary embolus. 2. Esophageal hiatal hernia with fluid and infiltration in the paraesophageal fat probably representing fat necrosis. No change. 3. Emphysematous changes and fibrosis in the lungs. No focal consolidation. 4. Nodular liver contour suggesting cirrhosis with mildly enlarged spleen. 5. Cholelithiasis. Electronically Signed   By: Burman Nieves M.D.   On: 02/18/2024 01:55   DG Chest Port 1 View Result Date: 02/17/2024 CLINICAL DATA:  Chest pain EXAM: PORTABLE CHEST 1 VIEW COMPARISON:  11/19/2023 FINDINGS: The heart size and mediastinal contours are within normal limits. Both lungs are clear. The visualized skeletal structures are unremarkable. IMPRESSION: No active disease. Electronically Signed   By: Alcide Clever M.D.   On: 02/17/2024 23:41     Assessment and Plan:   New onset atrial fibrillation with RVR -Patient presented with atrial fibrillation RVR rates of 150s -Given several boluses of IV diltiazem and transition to oral diltiazem -Remains in atrial fibrillation on cardiac monitoring -Continued on diltiazem 30 mg every 6 hours as she is to receive the first dose at 10 AM today, up titration of dose recommended -Currently on heparin infusion for CHA2DS2-VASc score of at least 3 for stroke prophylaxis, transition to apixaban 5 mg twice daily prior to discharge -TSH 9.815 -Echocardiogram ordered and pending with further recommendations to follow -Patient with several triggers for atrial fibrillation as far as dehydration, alcohol abuse, and hypokalemia as well as hypoglycemia -Continue on telemetry  monitoring  Elevated high-sensitivity troponin concerning for NSTEMI -Patient presented with chest pain that she states radiated into her back and wraps around the right side of her chest to the epigastric area and felt like a stabbing sensation with associated shortness of breath -Currently remains chest pain-free, likely secondary to demand ischemia from dehydration, atrial fibrillation RVR and hypotension -High-sensitivity troponins trended to 290 and 804, continue to trend until decline, additional troponin ordered -Echocardiogram ordered and pending to reassess wall motion abnormalities -Currently remains on heparin infusion -Continued on aspirin 81 mg daily -Lipid panel pending  Hypokalemia -Serum potassium 3.2 -Potassium supplementation ordered -Daily BMP -Monitor trend/replete electrolytes as needed -Recommend keeping potassium closer to 4 less than 5  Hypertension -Blood pressure 133/77 -Continue on diltiazem, PTA hydralazine, nadolol, and spironolactone remain on hold due to prior hypotension with EMS of 75/44 -Vital signs per unit protocol  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, esophageal varices -Patient previously had esophageal varices rebanded -Denies any bleeding or blood noted in the emesis or diarrhea -Ongoing management per IM  Alcohol use disorder/alcohol intoxication -EtOH level over 200 -CIWA protocol per IM -Total cessation is recommended  CKD stage IIIa -Serum creatinine 1.27 -Baseline serum creatinine 0.9-1.14 -monitor urine output -daily bmp -avoid nephrotoxic agents where able  Hypothyroidism -TSH 9.815 Free T40.51 -Continued on levothyroxine   Risk Assessment/Risk Scores:     TIMI Risk Score for Unstable Angina or Non-ST Elevation MI:   The patient's TIMI risk score is 4, which indicates a 20% risk of all cause mortality, new or recurrent myocardial infarction or need for urgent revascularization in the next 14 days.  [place] Heart Association (NYHA) Functional Class NYHA Class II  CHA2DS2-VASc Score = 3   This indicates a 3.2% annual risk of stroke. The patient's score is based upon: CHF History: 0 HTN History: 1 Diabetes History: 0 Stroke History: 0 Vascular Disease History: 0 Age Score: 1 Gender Score: 1         For questions or updates, please contact [facility] Please consult [email or web address] for contact info under    Signed, Kipton Skillen, NP  02/18/2024 8:28 AM

## 2024-02-18 NOTE — ED Notes (Signed)
 ED Provider at bedside updating pt and visitor.

## 2024-02-18 NOTE — Assessment & Plan Note (Signed)
 Converted to sinus tachycardia s/p diltiazem bolus x 2 followed by an oral dose of diltiazem Will continue oral diltiazem pending cardiology recommendations.  On nadolol for varices patient already on Eliquis for portal vein thrombosis Continuous cardiac monitoring Will get echocardiogram Counseled on cutting back on her drinking Cardiology consulted for additional recommendations

## 2024-02-18 NOTE — Assessment & Plan Note (Addendum)
 Patient presents with chest pain, no acute EKG changes but troponin 290---> 804 Could be related to demand from rapid A-fib, but ACS is a possibility Echocardiogram to evaluate for wall motion abnormality Continue heparin infusion Cardiology consult

## 2024-02-18 NOTE — Assessment & Plan Note (Addendum)
 No history of diabetes Possibly related to poor oral intake with ongoing alcohol use Received D50 in the ED Continue to monitor blood glucose Will start D5

## 2024-02-18 NOTE — Assessment & Plan Note (Signed)
 Uncertain etiology, LFTs WNL Given coexisting hypoglycemia question starvation ketosis/alcoholic ketosis Monitor for improvement with IV fluids

## 2024-02-18 NOTE — Assessment & Plan Note (Signed)
 -  Renal function  at baseline

## 2024-02-18 NOTE — Progress Notes (Addendum)
  INTERVAL PROGRESS NOTE    Kathryn [name]- 68 y.o. female  LOS: 0 ____________________________________  SUBJECTIVE: Admitted 02/17/2024 with cc of  Chief Complaint  Patient presents with   Irregular Heart Beat   Since admission, found to be in new Afib RVR with possible NSTEMI  OBJECTIVE: Blood pressure 133/77, pulse (!) 111, temperature 97.7 F (36.5 C), temperature source Oral, resp. rate 16, height 5\' 4"  (1.626 m), weight 89.8 kg, SpO2 100%.  General: NAD, pleasant, able to participate in exam  ASSESSMENT/PLAN:  I have reviewed the full H&P by Dr. Para March, and have reviewed pertinent results.   In addition:   Atrial fibrillation with rapid ventricular response (HCC) Remains afib this morning s/p several dilt doses but rate has improved.  - cardiology managing with diltiazem and titrate up as needed - continue heparin gtt - f/u echo    NSTEMI- peaked troponin at 804 Continue heparin infusion, aspirin Cardiology consult   High anion gap metabolic acidosis Uncertain etiology, LFTs WNL Given coexisting hypoglycemia question starvation ketosis/alcoholic ketosis Monitor for improvement with IV fluids   Hypoglycemia- improving with PO intake - dietician consult   Alcoholic cirrhosis of liver (HCC) Portal vein thrombosis Esophageal varices Continue nadolol and spironolactone Patient denies vomiting, blood in stool or melena   Alcohol use disorder Alcohol intoxication EtOH level over 200 Counseled on cutting back-3 drinks of liquor daily  CIWA withdrawal protocol   Hypertension Hold off on hydralazine, HCTZ, nadolol and spironolactone due to soft blood pressure with EMS of 75/44   CKD (chronic kidney disease), stage IIIa Renal function at baseline   Hypothyroidism Resume home levothyroxine 125 TSH up at 9.81 and T4 down at 0.51   Principal Problem:   Atrial fibrillation with rapid ventricular response (HCC) Active Problems:   Hypoglycemia   High anion  gap metabolic acidosis   NSTEMI (non-ST elevated myocardial infarction) (HCC)   Esophageal varices (HCC)   Portal vein thrombosis   Alcoholic cirrhosis of liver (HCC)   Alcohol use disorder   Hypertension   Hypothyroidism   CKD (chronic kidney disease), stage IIIa   Leeroy Bock, DO [facility] 02/18/2024, 8:35 AM    [email or web address] Available by Epic secure chat 7AM-7PM. If 7PM-7AM, please contact night-coverage   No Charge

## 2024-02-18 NOTE — Progress Notes (Signed)
 PHARMACY - ANTICOAGULATION CONSULT NOTE  Pharmacy Consult for Heparin  Indication: chest pain/ACS  Allergies  Allergen Reactions   Oxycodone-Acetaminophen    Hydrocodone-Acetaminophen Other (See Comments)    Reaction: pt can't take anything with tylenol due to her liver disease.    Ibuprofen Other (See Comments)    Esophageal varices   Naproxen Other (See Comments)    Esophageal varices   Propoxyphene Other (See Comments)    Reaction: pt isn't sure, but knows she can't take it.    Shellfish Allergy Hives   Tamiflu  [Oseltamivir Phosphate] Other (See Comments)    Esophageal varices    Patient Measurements: Height: 5\' 4"  (162.6 cm) Weight: 89.8 kg (198 lb) IBW/kg (Calculated) : 54.7 HEPARIN DW (KG): 74.8  Vital Signs: Temp: 99.2 F (37.3 C) (03/28 1732) Temp Source: Oral (03/28 1410) BP: 145/79 (03/28 1732) Pulse Rate: 74 (03/28 1732)  Labs: Recent Labs    02/17/24 2331 02/18/24 0158 02/18/24 0813 02/18/24 1027 02/18/24 1105 02/18/24 1811  HGB 10.7*  --   --   --   --   --   HCT 36.1  --   --   --   --   --   PLT 253  --   --   --   --   --   HEPARINUNFRC  --   --  >1.10*  --  0.55 0.45  CREATININE 1.27*  --   --  0.94  --   --   TROPONINIHS 290* 804*  --  3,476*  --   --     Estimated Creatinine Clearance: 63 mL/min (by C-G formula based on SCr of 0.94 mg/dL).   Medical History: Past Medical History:  Diagnosis Date   Alcoholic cirrhosis of liver (HCC)    Asthma    CHF (congestive heart failure) (HCC)    Chronic disease anemia    Esophageal varices (HCC)    GR I on EGD by Dr Shelle Iron 09/2014   ETOH abuse    Hypertension    Hypothyroidism    Murmur    Portal hypertension (HCC)    Sarcoidosis     Medications:  Medications Prior to Admission  Medication Sig Dispense Refill Last Dose/Taking   albuterol (VENTOLIN HFA) 108 (90 Base) MCG/ACT inhaler Inhale 1-2 puffs into the lungs every 4 (four) hours as needed for wheezing or shortness of breath.    Taking As Needed   B Complex-C-Folic Acid (B COMPLEX-VITAMIN C-FOLIC ACID) 1 MG tablet Take 1 tablet by mouth daily with breakfast. (Patient not taking: Reported on 02/18/2024) 90 tablet 3 Not Taking   ferrous sulfate 325 (65 FE) MG tablet Take 1 tablet (325 mg total) by mouth daily with breakfast. (Patient not taking: Reported on 02/18/2024) 30 tablet 3 Not Taking   folic acid (FOLVITE) 1 MG tablet Take 1 tablet (1 mg total) by mouth daily. (Patient not taking: Reported on 02/18/2024) 30 tablet 2 Not Taking   hydrALAZINE (APRESOLINE) 25 MG tablet Take 1 tablet (25 mg total) by mouth every 8 (eight) hours. (Patient not taking: Reported on 02/18/2024) 90 tablet 2 Not Taking   levothyroxine (SYNTHROID) 125 MCG tablet Take 125 mcg by mouth daily. (Patient not taking: Reported on 02/18/2024)   Not Taking   Multiple Vitamin (MULTIVITAMIN) tablet Take 1 tablet by mouth every other day. (Patient not taking: Reported on 02/18/2024)   Not Taking   nadolol (CORGARD) 40 MG tablet Take 1 tablet (40 mg total) by mouth daily. (Patient  not taking: Reported on 02/18/2024) 30 tablet 2 Not Taking   naltrexone (DEPADE) 50 MG tablet Take 50 mg by mouth daily. (Patient not taking: Reported on 02/18/2024)   Not Taking   omeprazole (PRILOSEC) 40 MG capsule Take 40 mg by mouth 2 (two) times daily. (Patient not taking: Reported on 02/18/2024)   Not Taking   spironolactone (ALDACTONE) 50 MG tablet Take 50 mg by mouth daily. (Patient not taking: Reported on 02/18/2024)   Not Taking    Assessment: Pharmacy consulted to dose heparin in this 68 year old female admitted with ACS/NSTEMI. PMH includes CHF, chronic anemia, HTN, murmur, portal hypertension, sarcoidosis. cTn trending up. No prior anticoag noted.   Goal of Therapy:  Heparin level 0.3-0.7 units/ml Monitor platelets by anticoagulation protocol: Yes  Date Time aPTT/HL Rate/Comment 0328 1105 HL 0.55 Therapeutic x 1 0328 1811 HL 0.45 Therapeutic x 2   Plan:  Continue heparin  infusion at 900 units/hr Next heparin level check with am labs CBC daily while on heparin infusion  Bari Mantis PharmD Clinical Pharmacist 02/18/2024

## 2024-02-18 NOTE — H&P (Addendum)
 History and Physical    Patient: Kathryn [name] ZOX:[id number] DOB: 1956-01-14 DOA: 02/17/2024 DOS: the patient was seen and examined on 02/18/2024 PCP: Leanord Asal, Nelva Bush, MD  Patient coming from: Home  Chief Complaint:  Chief Complaint  Patient presents with   Irregular Heart Beat    HPI: Kathryn [name] is a 68 y.o. female with medical history significant for Alcohol use disorder, cirrhosis with portal vein thrombosis on Eliquis, esophageal varices, HTN, hypothyroidism who presents to the ED by EMS with a complaint of chest pain.  She denied nausea or vomiting but does endorse loose stool for the past few days.  Denies cough, fever or chills, lower extremity pain or swelling.  EMS recorded a BP of 75/44 for which she received a 500 mL fluid bolus.  They also saw A-fib at a rate of 150 on the monitor. ED course and data review: Afebrile, heart rate in the 130s BP 97/58, O2 sat 100% on room air Labs notable for the following: Troponin 290--> 800, BNP 278 WBC 3700 and hemoglobin 10.7 Potassium 3.2, magnesium 1.8 Creatinine at baseline at 1.27 but with bicarb of 13 and anion gap 21 On arrival blood glucose was 36-> 42-> 182 following D50 TSH up at 9.81 and T4 down at 0.51 Hepatic function panel and lipase WNL Respiratory viral panel negative for COVID flu and RSV EtOH 219 EKG #1: A-fib at 132 EKG #2: A-fib at 153 EKG #3 s/p diltiazem bolus x 2: Sinus rhythm at 104 with no concerning ischemic ST-T wave changes  Treatment administered in the ED: Diltiazem IV bolus x 2 followed by oral Dilt NS bolus D50 x 1 Heparin infusion  Patient converted to sinus by the time of admission Hospitalist consulted for admission.   Review of Systems: As mentioned in the history of present illness. All other systems reviewed and are negative.  Past Medical History:  Diagnosis Date   Alcoholic cirrhosis of liver (HCC)    Asthma    CHF (congestive heart failure) (HCC)    Chronic disease anemia     Esophageal varices (HCC)    GR I on EGD by Dr Shelle Iron 09/2014   ETOH abuse    Hypertension    Hypothyroidism    Murmur    Portal hypertension (HCC)    Sarcoidosis    Past Surgical History:  Procedure Laterality Date   ESOPHAGOGASTRODUODENOSCOPY N/A 06/26/2021   Procedure: ESOPHAGOGASTRODUODENOSCOPY (EGD);  Surgeon: Toledo, Boykin Nearing, MD;  Location: [facility] ENDOSCOPY;  Service: Gastroenterology;  Laterality: N/A;   ESOPHAGOGASTRODUODENOSCOPY (EGD) WITH PROPOFOL N/A 04/04/2015   Procedure: ESOPHAGOGASTRODUODENOSCOPY (EGD) WITH PROPOFOL;  Surgeon: Midge Minium, MD;  Location: [facility] ENDOSCOPY;  Service: Endoscopy;  Laterality: N/A;  Dr Servando Snare prefers 1445   ESOPHAGOGASTRODUODENOSCOPY (EGD) WITH PROPOFOL N/A 12/04/2022   Procedure: ESOPHAGOGASTRODUODENOSCOPY (EGD) WITH PROPOFOL;  Surgeon: [name], MD;  Location: [facility] ENDOSCOPY;  Service: Gastroenterology;  Laterality: N/A;   ESOPHAGOGASTRODUODENOSCOPY W/ BANDING  09/2014   Rein   TUBAL LIGATION     Social History:  reports that she has never smoked. She has never used smokeless tobacco. She reports current alcohol use of about 15.0 standard drinks of alcohol per week. She reports that she does not use drugs.  Allergies  Allergen Reactions   Hydrocodone-Acetaminophen Other (See Comments)    Reaction: pt can't take anything with tylenol due to her liver disease.    Ibuprofen Other (See Comments)    Esophageal varices   Naproxen Other (See Comments)  Esophageal varices   Propoxyphene Other (See Comments)    Reaction: pt isn't sure, but knows she can't take it.    Shellfish Allergy Hives   Tamiflu  [Oseltamivir Phosphate] Other (See Comments)    Esophageal varices    Family History  Problem Relation Age of Onset   Aneurysm Father    COPD Father    Heart attack Mother     Prior to Admission medications   Medication Sig Start Date End Date Taking? Authorizing Provider  albuterol (VENTOLIN HFA) 108 (90 Base) MCG/ACT inhaler Inhale 1-2  puffs into the lungs every 4 (four) hours as needed for wheezing or shortness of breath. 06/14/23   [provider]  B Complex-C-Folic Acid (B COMPLEX-VITAMIN C-FOLIC ACID) 1 MG tablet Take 1 tablet by mouth daily with breakfast. 12/05/22   Enedina Finner, MD  ferrous sulfate 325 (65 FE) MG tablet Take 1 tablet (325 mg total) by mouth daily with breakfast. 12/05/22   Enedina Finner, MD  folic acid (FOLVITE) 1 MG tablet Take 1 tablet (1 mg total) by mouth daily. 12/05/22   Enedina Finner, MD  hydrALAZINE (APRESOLINE) 25 MG tablet Take 1 tablet (25 mg total) by mouth every 8 (eight) hours. 07/28/23   Arnetha Courser, MD  hydrochlorothiazide (HYDRODIURIL) 25 MG tablet Take 25 mg by mouth daily. 06/14/23   [provider]  levothyroxine (SYNTHROID) 125 MCG tablet Take 125 mcg by mouth daily. 05/04/23   [provider]  Multiple Vitamin (MULTIVITAMIN) tablet Take 1 tablet by mouth every other day.    [provider]  nadolol (CORGARD) 40 MG tablet Take 1 tablet (40 mg total) by mouth daily. 12/05/22   Enedina Finner, MD  omeprazole (PRILOSEC) 40 MG capsule Take 40 mg by mouth 2 (two) times daily. 04/22/23   [provider]  pantoprazole (PROTONIX) 40 MG tablet Take 1 tablet (40 mg total) by mouth 2 (two) times daily. Patient not taking: Reported on 07/27/2023 12/05/22   Enedina Finner, MD  spironolactone (ALDACTONE) 50 MG tablet Take 50 mg by mouth daily. 03/12/23   [provider]  thiamine 100 MG tablet Take 1 tablet (100 mg total) by mouth daily. 04/07/22   Ward, Layla Maw, DO    Physical Exam: Vitals:   02/18/24 0314 02/18/24 0330 02/18/24 0345 02/18/24 0400  BP:  127/65  125/74  Pulse:  (!) 110 (!) 107 (!) 108  Resp:  18 16 18   Temp: 97.6 F (36.4 C)     TempSrc: Oral     SpO2:  100% 100% 100%  Weight:      Height:       Physical Exam Vitals and nursing note reviewed.  Constitutional:      General: She is not in acute distress. HENT:     Head: Normocephalic and  atraumatic.  Cardiovascular:     Rate and Rhythm: Regular rhythm. Tachycardia present.     Heart sounds: Normal heart sounds.  Pulmonary:     Effort: Pulmonary effort is normal.     Breath sounds: Normal breath sounds.  Abdominal:     Palpations: Abdomen is soft.     Tenderness: There is no abdominal tenderness.  Neurological:     Mental Status: Mental status is at baseline.     Labs on Admission: I have personally reviewed following labs and imaging studies  CBC: Recent Labs  Lab 02/17/24 2331  WBC 3.7*  NEUTROABS 2.4  HGB 10.7*  HCT 36.1  MCV 93.3  PLT 253   Basic Metabolic Panel: Recent Labs  Lab 02/17/24 2331  NA 138  K 3.2*  CL 104  CO2 13*  GLUCOSE 198*  BUN 19  CREATININE 1.27*  CALCIUM 7.7*  MG 1.8  PHOS 4.0   GFR: Estimated Creatinine Clearance: 46.6 mL/min (A) (by C-G formula based on SCr of 1.27 mg/dL (H)). Liver Function Tests: Recent Labs  Lab 02/17/24 2331  AST 35  ALT 13  ALKPHOS 58  BILITOT 1.0  PROT 6.7  ALBUMIN 2.8*   Recent Labs  Lab 02/17/24 2331  LIPASE 29   No results for input(s): "AMMONIA" in the last 168 hours. Coagulation Profile: No results for input(s): "INR", "PROTIME" in the last 168 hours. Cardiac Enzymes: No results for input(s): "CKTOTAL", "CKMB", "CKMBINDEX", "TROPONINI" in the last 168 hours. BNP (last 3 results) No results for input(s): "PROBNP" in the last 8760 hours. HbA1C: No results for input(s): "HGBA1C" in the last 72 hours. CBG: Recent Labs  Lab 02/17/24 2319 02/17/24 2323 02/17/24 2344 02/18/24 0102 02/18/24 0414  GLUCAP 36* 42* 182* 112* 71   Lipid Profile: No results for input(s): "CHOL", "HDL", "LDLCALC", "TRIG", "CHOLHDL", "LDLDIRECT" in the last 72 hours. Thyroid Function Tests: Recent Labs    02/17/24 2331  TSH 9.815*  FREET4 0.51*   Anemia Panel: No results for input(s): "VITAMINB12", "FOLATE", "FERRITIN", "TIBC", "IRON", "RETICCTPCT" in the last 72 hours. Urine analysis:     Component Value Date/Time   COLORURINE YELLOW (A) 07/27/2023 1631   APPEARANCEUR CLEAR (A) 07/27/2023 1631   APPEARANCEUR Clear 09/25/2014 1610   LABSPEC 1.041 (H) 07/27/2023 1631   LABSPEC 1.023 09/25/2014 1610   PHURINE 5.0 07/27/2023 1631   GLUCOSEU NEGATIVE 07/27/2023 1631   GLUCOSEU Negative 09/25/2014 1610   HGBUR SMALL (A) 07/27/2023 1631   BILIRUBINUR NEGATIVE 07/27/2023 1631   BILIRUBINUR Negative 09/25/2014 1610   KETONESUR 80 (A) 07/27/2023 1631   PROTEINUR 30 (A) 07/27/2023 1631   NITRITE NEGATIVE 07/27/2023 1631   LEUKOCYTESUR NEGATIVE 07/27/2023 1631   LEUKOCYTESUR Negative 09/25/2014 1610    Radiological Exams on Admission: CT Angio Chest PE W/Cm &/Or Wo Cm Result Date: 02/18/2024 CLINICAL DATA:  Pulmonary embolus suspected with high probability. Chest pain beginning around 10 o'clock extending to the back. New atrial fibrillation. EXAM: CT ANGIOGRAPHY CHEST WITH CONTRAST TECHNIQUE: Multidetector CT imaging of the chest was performed using the standard protocol during bolus administration of intravenous contrast. Multiplanar CT image reconstructions and MIPs were obtained to evaluate the vascular anatomy. RADIATION DOSE REDUCTION: This exam was performed according to the departmental dose-optimization program which includes automated exposure control, adjustment of the mA and/or kV according to patient size and/or use of iterative reconstruction technique. CONTRAST:  75mL OMNIPAQUE IOHEXOL 350 MG/ML SOLN COMPARISON:  Chest radiograph 02/17/2024. CTA chest abdomen and pelvis 12/02/2022 FINDINGS: Cardiovascular: Technically adequate study with good opacification of the central and segmental pulmonary arteries. Mild motion artifact. No focal filling defects are demonstrated. No evidence of significant pulmonary embolus. Mild cardiac enlargement. No pericardial effusions. Normal caliber thoracic aorta. Calcification of the aorta and coronary arteries. Mediastinum/Nodes: Esophagus is  decompressed. No significant lymphadenopathy in the chest. Small esophageal hiatal hernia containing fat with paraesophageal soft tissue possibly representing fat necrosis related to the hernia. No change since prior study. Lungs/Pleura: Emphysematous changes in the lungs. Slight fibrosis in the lung bases. No airspace disease or consolidation. No pleural effusion or pneumothorax. Upper Abdomen: Nodular liver contour suggesting cirrhosis. Spleen is mildly enlarged. Cholelithiasis. No inflammatory  changes. Musculoskeletal: No chest wall abnormality. No acute or significant osseous findings. Review of the MIP images confirms the above findings. IMPRESSION: 1. No evidence of significant pulmonary embolus. 2. Esophageal hiatal hernia with fluid and infiltration in the paraesophageal fat probably representing fat necrosis. No change. 3. Emphysematous changes and fibrosis in the lungs. No focal consolidation. 4. Nodular liver contour suggesting cirrhosis with mildly enlarged spleen. 5. Cholelithiasis. Electronically Signed   By: Burman Nieves M.D.   On: 02/18/2024 01:55   DG Chest Port 1 View Result Date: 02/17/2024 CLINICAL DATA:  Chest pain EXAM: PORTABLE CHEST 1 VIEW COMPARISON:  11/19/2023 FINDINGS: The heart size and mediastinal contours are within normal limits. Both lungs are clear. The visualized skeletal structures are unremarkable. IMPRESSION: No active disease. Electronically Signed   By: Alcide Clever M.D.   On: 02/17/2024 23:41     Data Reviewed: Relevant notes from primary care and specialist visits, past discharge summaries as available in EHR, including Care Everywhere. Prior diagnostic testing as pertinent to current admission diagnoses Updated medications and problem lists for reconciliation ED course, including vitals, labs, imaging, treatment and response to treatment Triage notes, nursing and pharmacy notes and ED provider's notes Notable results as noted in HPI   Assessment and  Plan: * Atrial fibrillation with rapid ventricular response (HCC) Converted to sinus tachycardia s/p diltiazem bolus x 2 followed by an oral dose of diltiazem Will continue oral diltiazem pending cardiology recommendations.  On nadolol for varices patient already on Eliquis for portal vein thrombosis Continuous cardiac monitoring Will get echocardiogram Counseled on cutting back on her drinking Cardiology consulted for additional recommendations  NSTEMI (non-ST elevated myocardial infarction) [facility]) Patient presents with chest pain, no acute EKG changes but troponin 290---> 804 Could be related to demand from rapid A-fib, but ACS is a possibility Echocardiogram to evaluate for wall motion abnormality Continue heparin infusion Cardiology consult  High anion gap metabolic acidosis Uncertain etiology, LFTs WNL Given coexisting hypoglycemia question starvation ketosis/alcoholic ketosis Monitor for improvement with IV fluids  Hypoglycemia No history of diabetes Possibly related to poor oral intake with ongoing alcohol use Received D50 in the ED Continue to monitor blood glucose Will start D5  Alcoholic cirrhosis of liver (HCC) Portal vein thrombosis Esophageal varices Continue nadolol and spironolactone Patient denies vomiting, blood in stool or melena  Alcohol use disorder Alcohol intoxication EtOH level over 200 Counseled on cutting back-3 drinks of liquor daily  CIWA withdrawal protocol  Hypertension Hold off on hydralazine, HCTZ, nadolol and spironolactone due to soft blood pressure with EMS of 75/44  CKD (chronic kidney disease), stage IIIa Renal function at baseline  Hypothyroidism Resume home levothyroxine 125 TSH up at 9.81 and T4 down at 0.51        DVT prophylaxis: Heparin infusion  Consults: [facility] cardiology  Advance Care Planning:   Code Status: Prior   Family Communication: none  Disposition Plan: Back to previous home environment  Severity of  Illness: The appropriate patient status for this patient is INPATIENT. Inpatient status is judged to be reasonable and necessary in order to provide the required intensity of service to ensure the patient's safety. The patient's presenting symptoms, physical exam findings, and initial radiographic and laboratory data in the context of their chronic comorbidities is felt to place them at high risk for further clinical deterioration. Furthermore, it is not anticipated that the patient will be medically stable for discharge from the hospital within 2 midnights of admission.   * I  certify that at the point of admission it is my clinical judgment that the patient will require inpatient hospital care spanning beyond 2 midnights from the point of admission due to high intensity of service, high risk for further deterioration and high frequency of surveillance required.*  Author: Andris Baumann, MD 02/18/2024 5:16 AM  For on call review [email or web address].

## 2024-02-18 NOTE — Progress Notes (Addendum)
 PHARMACY - ANTICOAGULATION CONSULT NOTE  Pharmacy Consult for Heparin  Indication: chest pain/ACS  Allergies  Allergen Reactions   Oxycodone-Acetaminophen    Hydrocodone-Acetaminophen Other (See Comments)    Reaction: pt can't take anything with tylenol due to her liver disease.    Ibuprofen Other (See Comments)    Esophageal varices   Naproxen Other (See Comments)    Esophageal varices   Propoxyphene Other (See Comments)    Reaction: pt isn't sure, but knows she can't take it.    Shellfish Allergy Hives   Tamiflu  [Oseltamivir Phosphate] Other (See Comments)    Esophageal varices    Patient Measurements: Height: 5\' 4"  (162.6 cm) Weight: 89.8 kg (198 lb) IBW/kg (Calculated) : 54.7 HEPARIN DW (KG): 74.8  Vital Signs: Temp: 97.7 F (36.5 C) (03/28 0600) Temp Source: Oral (03/28 0600) BP: 141/84 (03/28 1033) Pulse Rate: 112 (03/28 1033)  Labs: Recent Labs    02/17/24 2331 02/18/24 0158 02/18/24 0813 02/18/24 1027 02/18/24 1105  HGB 10.7*  --   --   --   --   HCT 36.1  --   --   --   --   PLT 253  --   --   --   --   HEPARINUNFRC  --   --  >1.10*  --  0.55  CREATININE 1.27*  --   --  0.94  --   TROPONINIHS 290* 804*  --  3,476*  --     Estimated Creatinine Clearance: 63 mL/min (by C-G formula based on SCr of 0.94 mg/dL).   Medical History: Past Medical History:  Diagnosis Date   Alcoholic cirrhosis of liver (HCC)    Asthma    CHF (congestive heart failure) (HCC)    Chronic disease anemia    Esophageal varices (HCC)    GR I on EGD by Dr Shelle Iron 09/2014   ETOH abuse    Hypertension    Hypothyroidism    Murmur    Portal hypertension (HCC)    Sarcoidosis     Medications:  (Not in a hospital admission)   Assessment: Pharmacy consulted to dose heparin in this 68 year old female admitted with ACS/NSTEMI. PMH includes CHF, chronic anemia, HTN, murmur, portal hypertension, sarcoidosis. cTn trending up. No prior anticoag noted.   Goal of Therapy:  Heparin  level 0.3-0.7 units/ml Monitor platelets by anticoagulation protocol: Yes  Date Time aPTT/HL Rate/Comment 0328 1105 HL 0.55 Therapeutic x 1   Plan:  Continue heparin infusion at 900 units/hr Next heparin level check in 6 hours CBC daily while on heparin infusion  Will M. Dareen Piano, PharmD Clinical Pharmacist 02/18/2024 11:50 AM

## 2024-02-18 NOTE — Discharge Instructions (Addendum)
 Please arrange follow up with your PCP within 1-2 weeks to monitor your progress in recovery.  I recommend you gradually increase your physical activity. Walking daily is a great way to keep your heart healthy I have continued your eliquis. Please look for signs of bleeding with this medication.  Follow up with cardiology as their office directs you  Intensive Outpatient Programs   [facility] [address] [place],  [place], [place] [phone]  [facility] (Inpatient and outpatient)[phone] (Suboxone and Methadone) [address] [phone]  [facility] Programs - Intensive Outpatient [address] [place], [place] [address]  [phone]  [facility] (Outpatient, Inpatient, [facility] (Groups and [facility]) (insurance only) [phone] [place], [place] [phone]   [facility] (for caregivers and family) [address] Laurell Josephs [address], [place], [place] [phone]  Residential Treatment Programs  [facility](2 years) Residential: 66 days)[facility] [address] [place], [place], [place] [phone] or [phone]  [facility] [address] [place], [place], [place] [phone]  [facility] ([facility] [address] [place], [place], [place] [phone] Admissions: 8am-3pm M-F  [facility] Program: Residential Program [phone] Days)             [facility]: [facility]  [place], [place], [place]  [phone] or [phone] in Hours over the weekend or by referral)  [facility] [address] [place], [place] [address] [phone] (Do virtual or phone assessment, offer transportation within 25 miles, have in patient and Outpatient options)   [facility] Crisis: [facility]:[phone] (for crisis response 24 hours a day)

## 2024-02-18 NOTE — Assessment & Plan Note (Signed)
 Resume home levothyroxine 125 TSH up at 9.81 and T4 down at 0.51

## 2024-02-18 NOTE — Progress Notes (Signed)
 PHARMACY - ANTICOAGULATION CONSULT NOTE  Pharmacy Consult for Heparin  Indication: chest pain/ACS  Allergies  Allergen Reactions   Hydrocodone-Acetaminophen Other (See Comments)    Reaction: pt can't take anything with tylenol due to her liver disease.    Ibuprofen Other (See Comments)    Esophageal varices   Naproxen Other (See Comments)    Esophageal varices   Propoxyphene Other (See Comments)    Reaction: pt isn't sure, but knows she can't take it.    Shellfish Allergy Hives   Tamiflu  [Oseltamivir Phosphate] Other (See Comments)    Esophageal varices    Patient Measurements: Height: 5\' 4"  (162.6 cm) Weight: 89.8 kg (198 lb) IBW/kg (Calculated) : 54.7 HEPARIN DW (KG): 74.8  Vital Signs: Temp: 96.7 F (35.9 C) (03/27 2316) Temp Source: Oral (03/27 2316) BP: 136/76 (03/28 0030) Pulse Rate: 38 (03/28 0030)  Labs: Recent Labs    02/17/24 2331  HGB 10.7*  HCT 36.1  PLT 253  CREATININE 1.27*  TROPONINIHS 290*    Estimated Creatinine Clearance: 46.6 mL/min (A) (by C-G formula based on SCr of 1.27 mg/dL (H)).   Medical History: Past Medical History:  Diagnosis Date   Alcoholic cirrhosis of liver (HCC)    Asthma    CHF (congestive heart failure) (HCC)    Chronic disease anemia    Esophageal varices (HCC)    GR I on EGD by Dr Shelle Iron 09/2014   ETOH abuse    Hypertension    Hypothyroidism    Murmur    Portal hypertension (HCC)    Sarcoidosis     Medications:  (Not in a hospital admission)   Assessment: Pharmacy consulted to dose heparin in this 68 year old female admitted with ACS/NSTEMI.  No prior anticoag noted. CrCl = ?    Goal of Therapy:  Heparin level 0.3-0.7 units/ml Monitor platelets by anticoagulation protocol: Yes   Plan:  Give 4000 units bolus x 1 Start heparin infusion at 900 units/hr Check anti-Xa level in 6 hours and daily while on heparin Continue to monitor H&H and platelets  Bralyn Espino D 02/18/2024,1:08 AM

## 2024-02-18 NOTE — Assessment & Plan Note (Signed)
 Hold off on hydralazine, HCTZ, nadolol and spironolactone due to soft blood pressure with EMS of 75/44

## 2024-02-18 NOTE — TOC Initial Note (Signed)
 Transition of Care [facility]) - Initial/Assessment Note    Patient Details  Name: Kathryn [name] MRN: [id number] Date of Birth: [birth year]  Transition of Care [facility]) CM/SW Contact:    Elberta Fortis, RN Phone Number: 02/18/2024, 10:52 AM  Clinical Narrative: Met with pt briefly in the ED and she's waiting further cardiac workup for a new DX of a-fib. Pt is in agreement with SA resources for ETOH being added to her AVS. TOC will continue to follow.                         Patient Goals and CMS Choice            Expected Discharge Plan and Services                                              Prior Living Arrangements/Services                       Activities of Daily Living      Permission Sought/Granted                  Emotional Assessment              Admission diagnosis:  Atrial fibrillation with rapid ventricular response (HCC) [I48.91] Patient Active Problem List   Diagnosis Date Noted   Atrial fibrillation with rapid ventricular response (HCC) 02/18/2024   NSTEMI (non-ST elevated myocardial infarction) (HCC) 02/18/2024   Alcoholic cirrhosis of liver (HCC)    Nausea vomiting and diarrhea 07/28/2023   Nausea and vomiting 07/28/2023   Colitis 07/27/2023   High anion gap metabolic acidosis 07/27/2023   Alcohol use disorder 12/02/2022   Sarcoid 12/02/2022   Hypokalemia 01/05/2022   Hypomagnesemia 01/05/2022   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   Physical deconditioning 01/04/2022   Cholelithiasis without cholecystitis 01/03/2022   Thrombocytopenia (HCC) 01/03/2022   Aortic atherosclerosis (HCC)    Acute pancreatitis 01/02/2022   Symptomatic anemia 06/24/2021   Hypertensive urgency 06/24/2021   Asthma 06/24/2021   CKD (chronic kidney disease), stage IIIa 06/24/2021   Chronic CHF (HCC) 06/24/2021   Pancytopenia (HCC) 06/24/2021   Community acquired pneumonia 11/20/2019   Pneumonia due to COVID-19 virus 11/20/2019   Anemia  06/12/2016   Hypoglycemia 02/11/2016   Hematemesis 04/04/2015   Cirrhosis (HCC) 04/04/2015   Esophageal varices (HCC) 04/04/2015   Hypothyroidism 04/04/2015   Hypertension 04/04/2015   Chest pain 04/04/2015   PCP:  Norval Morton, MD Pharmacy:   [facility] [address] [place] - [address] [place]) [place] [address] Phone: [phone] Fax: [phone]     Social Drivers of Health (SDOH) Social History: SDOH Screenings   Food Insecurity: Food Insecurity Present (12/08/2023)  Housing: Unknown (12/08/2023)  Transportation Needs: No Transportation Needs (12/08/2023)  Utilities: Not At Risk (12/08/2023)  Depression (PHQ2-9): Low Risk  (12/08/2023)  Tobacco Use: Low Risk  (02/18/2024)   SDOH Interventions:     Readmission Risk Interventions    12/03/2022   12:32 PM  Readmission Risk Prevention Plan  Transportation Screening Complete  PCP or Specialist Appt within 3-5 Days Complete  Social Work Consult for Recovery Care Planning/Counseling Complete  Palliative Care Screening Not Applicable  Medication Review [employer]) Complete

## 2024-02-19 DIAGNOSIS — I4891 Unspecified atrial fibrillation: Secondary | ICD-10-CM | POA: Diagnosis not present

## 2024-02-19 DIAGNOSIS — I214 Non-ST elevation (NSTEMI) myocardial infarction: Secondary | ICD-10-CM | POA: Diagnosis not present

## 2024-02-19 DIAGNOSIS — K7030 Alcoholic cirrhosis of liver without ascites: Secondary | ICD-10-CM | POA: Diagnosis not present

## 2024-02-19 LAB — COMPREHENSIVE METABOLIC PANEL WITH GFR
ALT: 15 U/L (ref 0–44)
AST: 55 U/L — ABNORMAL HIGH (ref 15–41)
Albumin: 2.8 g/dL — ABNORMAL LOW (ref 3.5–5.0)
Alkaline Phosphatase: 55 U/L (ref 38–126)
Anion gap: 9 (ref 5–15)
BUN: 17 mg/dL (ref 8–23)
CO2: 23 mmol/L (ref 22–32)
Calcium: 8.7 mg/dL — ABNORMAL LOW (ref 8.9–10.3)
Chloride: 105 mmol/L (ref 98–111)
Creatinine, Ser: 0.88 mg/dL (ref 0.44–1.00)
GFR, Estimated: >60 mL/min (ref >60)
Glucose, Bld: 116 mg/dL — ABNORMAL HIGH (ref 70–99)
Potassium: 3.4 mmol/L — ABNORMAL LOW (ref 3.5–5.1)
Sodium: 137 mmol/L (ref 135–145)
Total Bilirubin: 0.9 mg/dL (ref 0.0–1.2)
Total Protein: 6.9 g/dL (ref 6.5–8.1)

## 2024-02-19 LAB — CBC
HCT: 29.8 % — ABNORMAL LOW (ref 36.0–46.0)
Hemoglobin: 9.5 g/dL — ABNORMAL LOW (ref 12.0–15.0)
MCH: 27.4 pg (ref 26.0–34.0)
MCHC: 31.9 g/dL (ref 30.0–36.0)
MCV: 85.9 fL (ref 80.0–100.0)
Platelets: 144 10*3/uL — ABNORMAL LOW (ref 150–400)
RBC: 3.47 MIL/uL — ABNORMAL LOW (ref 3.87–5.11)
RDW: 16 % — ABNORMAL HIGH (ref 11.5–15.5)
WBC: 2.5 10*3/uL — ABNORMAL LOW (ref 4.0–10.5)
nRBC: 2.4 % — ABNORMAL HIGH (ref 0.0–0.2)

## 2024-02-19 LAB — HEPARIN LEVEL (UNFRACTIONATED): Heparin Unfractionated: 0.51 [IU]/mL (ref 0.30–0.70)

## 2024-02-19 LAB — MAGNESIUM: Magnesium: 1.7 mg/dL (ref 1.7–2.4)

## 2024-02-19 LAB — GLUCOSE, CAPILLARY
Glucose-Capillary: 121 mg/dL — ABNORMAL HIGH (ref 70–99)
Glucose-Capillary: 168 mg/dL — ABNORMAL HIGH (ref 70–99)

## 2024-02-19 MED ORDER — POTASSIUM CHLORIDE CRYS ER 20 MEQ PO TBCR
40.0000 meq | EXTENDED_RELEASE_TABLET | Freq: Two times a day (BID) | ORAL | Status: AC
  Administered 2024-02-19 (×2): 40 meq via ORAL
  Filled 2024-02-19 (×2): qty 2

## 2024-02-19 MED ORDER — OXYCODONE-ACETAMINOPHEN 5-325 MG PO TABS
1.0000 | ORAL_TABLET | Freq: Three times a day (TID) | ORAL | Status: DC | PRN
  Administered 2024-02-19: 1 via ORAL
  Filled 2024-02-19: qty 1

## 2024-02-19 NOTE — Plan of Care (Signed)
  Problem: Activity: Goal: Ability to tolerate increased activity will improve Outcome: Progressing   Problem: Cardiac: Goal: Ability to achieve and maintain adequate cardiovascular perfusion will improve Outcome: Progressing   Problem: Education: Goal: Knowledge of General Education information will improve Description: Including pain rating scale, medication(s)/side effects and non-pharmacologic comfort measures Outcome: Progressing   Problem: Health Behavior/Discharge Planning: Goal: Ability to manage health-related needs will improve Outcome: Progressing   Problem: Clinical Measurements: Goal: Will remain free from infection Outcome: Progressing Goal: Diagnostic test results will improve Outcome: Progressing Goal: Respiratory complications will improve Outcome: Progressing Goal: Cardiovascular complication will be avoided Outcome: Progressing   Problem: Nutrition: Goal: Adequate nutrition will be maintained Outcome: Progressing   Problem: Coping: Goal: Level of anxiety will decrease Outcome: Progressing   Problem: Elimination: Goal: Will not experience complications related to bowel motility Outcome: Progressing   Problem: Pain Managment: Goal: General experience of comfort will improve and/or be controlled Outcome: Progressing   Problem: Safety: Goal: Ability to remain free from injury will improve Outcome: Progressing

## 2024-02-19 NOTE — Progress Notes (Signed)
 PHARMACY - ANTICOAGULATION CONSULT NOTE  Pharmacy Consult for Heparin  Indication: chest pain/ACS  Allergies  Allergen Reactions   Oxycodone-Acetaminophen    Hydrocodone-Acetaminophen Other (See Comments)    Reaction: pt can't take anything with tylenol due to her liver disease.    Ibuprofen Other (See Comments)    Esophageal varices   Naproxen Other (See Comments)    Esophageal varices   Propoxyphene Other (See Comments)    Reaction: pt isn't sure, but knows she can't take it.    Shellfish Allergy Hives   Tamiflu  [Oseltamivir Phosphate] Other (See Comments)    Esophageal varices    Patient Measurements: Height: 5\' 4"  (162.6 cm) Weight: 89.8 kg (198 lb) IBW/kg (Calculated) : 54.7 HEPARIN DW (KG): 74.8  Vital Signs: Temp: 98.2 F (36.8 C) (03/29 0427) Temp Source: Oral (03/29 0005) BP: 148/80 (03/29 0619) Pulse Rate: 67 (03/29 0619)  Labs: Recent Labs    02/17/24 2331 02/18/24 0158 02/18/24 0813 02/18/24 1027 02/18/24 1105 02/18/24 1811 02/19/24 0604  HGB 10.7*  --   --   --   --   --   --   HCT 36.1  --   --   --   --   --   --   PLT 253  --   --   --   --   --   --   HEPARINUNFRC  --   --    < >  --  0.55 0.45 0.51  CREATININE 1.27*  --   --  0.94  --   --   --   TROPONINIHS 290* 804*  --  3,476*  --   --   --    < > = values in this interval not displayed.    Estimated Creatinine Clearance: 63 mL/min (by C-G formula based on SCr of 0.94 mg/dL).   Medical History: Past Medical History:  Diagnosis Date   Alcoholic cirrhosis of liver (HCC)    Asthma    CHF (congestive heart failure) (HCC)    Chronic disease anemia    Esophageal varices (HCC)    GR I on EGD by Dr Shelle Iron 09/2014   ETOH abuse    Hypertension    Hypothyroidism    Murmur    Portal hypertension (HCC)    Sarcoidosis     Medications:  Medications Prior to Admission  Medication Sig Dispense Refill Last Dose/Taking   albuterol (VENTOLIN HFA) 108 (90 Base) MCG/ACT inhaler Inhale 1-2  puffs into the lungs every 4 (four) hours as needed for wheezing or shortness of breath.   Taking As Needed   B Complex-C-Folic Acid (B COMPLEX-VITAMIN C-FOLIC ACID) 1 MG tablet Take 1 tablet by mouth daily with breakfast. (Patient not taking: Reported on 02/18/2024) 90 tablet 3 Not Taking   ferrous sulfate 325 (65 FE) MG tablet Take 1 tablet (325 mg total) by mouth daily with breakfast. (Patient not taking: Reported on 02/18/2024) 30 tablet 3 Not Taking   folic acid (FOLVITE) 1 MG tablet Take 1 tablet (1 mg total) by mouth daily. (Patient not taking: Reported on 02/18/2024) 30 tablet 2 Not Taking   hydrALAZINE (APRESOLINE) 25 MG tablet Take 1 tablet (25 mg total) by mouth every 8 (eight) hours. (Patient not taking: Reported on 02/18/2024) 90 tablet 2 Not Taking   levothyroxine (SYNTHROID) 125 MCG tablet Take 125 mcg by mouth daily. (Patient not taking: Reported on 02/18/2024)   Not Taking   Multiple Vitamin (MULTIVITAMIN) tablet Take 1 tablet  by mouth every other day. (Patient not taking: Reported on 02/18/2024)   Not Taking   nadolol (CORGARD) 40 MG tablet Take 1 tablet (40 mg total) by mouth daily. (Patient not taking: Reported on 02/18/2024) 30 tablet 2 Not Taking   naltrexone (DEPADE) 50 MG tablet Take 50 mg by mouth daily. (Patient not taking: Reported on 02/18/2024)   Not Taking   omeprazole (PRILOSEC) 40 MG capsule Take 40 mg by mouth 2 (two) times daily. (Patient not taking: Reported on 02/18/2024)   Not Taking   spironolactone (ALDACTONE) 50 MG tablet Take 50 mg by mouth daily. (Patient not taking: Reported on 02/18/2024)   Not Taking    Assessment: Pharmacy consulted to dose heparin in this 68 year old female admitted with ACS/NSTEMI. PMH includes CHF, chronic anemia, HTN, murmur, portal hypertension, sarcoidosis. cTn trending up. No prior anticoag noted.   Goal of Therapy:  Heparin level 0.3-0.7 units/ml Monitor platelets by anticoagulation protocol:  Yes  Date Time aPTT/HL Rate/Comment 0328 1105 HL 0.55 Therapeutic x 1 0328 1811 HL 0.45 Therapeutic x 2 0329    0604   HL  0.51           Therapeutic X 3    Plan:  Continue heparin infusion at 900 units/hr Next heparin level check with am labs CBC daily while on heparin infusion  Chyler Creely D Clinical Pharmacist 02/19/2024

## 2024-02-19 NOTE — Progress Notes (Signed)
 PROGRESS NOTE  Kathryn [name]    DOB: 26-Oct-1956, 68 y.o.  ZOX:[id number]    Code Status: Full Code   DOA: 02/17/2024   LOS: 1   Brief hospital course  Kathryn [name] is a 68yo female with PMH of Alcohol use disorder, cirrhosis with portal vein thrombosis on Eliquis, esophageal varices, HTN, hypothyroidism who presents to the ED by EMS with a complaint of chest pain.   EMS recorded a BP of 75/44 for which she received a 500 mL fluid bolus.  They also saw A-fib at a rate of 150 on the monitor. ED course and data review: Afebrile, heart rate in the 130s BP 97/58, O2 sat 100% on room air Labs notable for the following: Troponin 290--> 800, BNP 278 WBC 3700 and hemoglobin 10.7 Potassium 3.2, magnesium 1.8 Creatinine at baseline at 1.27 but with bicarb of 13 and anion gap 21 On arrival blood glucose was 36-> 42-> 182 following D50 TSH up at 9.81 and T4 down at 0.51 Hepatic function panel and lipase WNL Respiratory viral panel negative for COVID flu and RSV EtOH 219 EKG #1: A-fib at 132 EKG #2: A-fib at 153 EKG #3 s/p diltiazem bolus x 2: Sinus rhythm at 104 with no concerning ischemic ST-T wave changes   Treatment administered in the ED: Diltiazem IV bolus x 2 followed by oral Dilt NS bolus D50 x 1 Heparin infusion  02/19/24 -cardiology consulted for Afib, NSTEMI. Remains stable on heparin gtt over the weekend and planning cath Monday  Assessment & Plan  Principal Problem:   Atrial fibrillation with rapid ventricular response (HCC) Active Problems:   Hypoglycemia   High anion gap metabolic acidosis   NSTEMI (non-ST elevated myocardial infarction) (HCC)   Esophageal varices (HCC)   Portal vein thrombosis   Alcoholic cirrhosis of liver (HCC)   Alcohol use disorder   Hypertension   Hypothyroidism   CKD (chronic kidney disease), stage IIIa  Atrial fibrillation with RVR- HR in 60s - cardiology following -On nadolol for varices - continue diltizem pending echo results - continue  heparin gtt   NSTEMI - Patient presents with chest pain, no acute EKG changes but troponin 290---> 3,476 - cath on monday   Hypokalemia- replace K+ and check Mg++  High anion gap metabolic acidosis- resolved with IV fluids   Hypoglycemia- asymptomatic and resolved with IVF with dextrose   Alcoholic cirrhosis of liver (HCC) Portal vein thrombosis Esophageal varices Continue nadolol and spironolactone Patient denies vomiting, blood in stool or melena   Alcohol use disorder Alcohol intoxication EtOH level over 200 on presentation CIWA withdrawal protocol   Hypertension- titrate back on as tolerated given initial hypotension   CKD (chronic kidney disease), stage IIIa Renal function at baseline   Hypothyroidism Resume home levothyroxine 125 TSH up at 9.81 and T4 down at 0.51  Body mass index is 33.99 kg/m.  VTE ppx: heparin gtt  Diet:     Diet   Diet Heart Room service appropriate? Yes; Fluid consistency: Thin   Consultants: Cardiology   Subjective 02/19/24    Pt reports some back pain this am. Treated with oxycodone. Denies chest pain or SOB currently   Objective   Vitals:   02/19/24 0112 02/19/24 0200 02/19/24 0427 02/19/24 0619  BP: 139/79  131/87 (!) 148/80  Pulse: 63 62 63 67  Resp:   18   Temp:   98.2 F (36.8 C)   TempSrc:      SpO2: 95%  95% 98%  Weight:      Height:        Intake/Output Summary (Last 24 hours) at 02/19/2024 0745 Last data filed at 02/19/2024 0656 Gross per 24 hour  Intake 773.67 ml  Output --  Net 773.67 ml   Filed Weights   02/17/24 2318  Weight: 89.8 kg     Physical Exam:  General: awake, alert, NAD HEENT: atraumatic, clear conjunctiva, anicteric sclera, MMM, hearing grossly normal Respiratory: normal respiratory effort. CTAB Cardiovascular: quick capillary refill, normal S1/S2, RRR, no JVD, murmurs Gastrointestinal: soft, NT, ND Nervous: A&O x3. no gross focal neurologic deficits, normal speech Extremities: moves  all equally, no edema, normal tone Skin: dry, intact, normal temperature, normal color. No rashes, lesions or ulcers on exposed skin Psychiatry: normal mood, congruent affect  Labs   I have personally reviewed the following labs and imaging studies CBC    Component Value Date/Time   WBC 3.7 (L) 02/17/2024 2331   RBC 3.87 02/17/2024 2331   HGB 10.7 (L) 02/17/2024 2331   HGB 10.6 (L) 12/08/2023 1213   HGB 9.9 (L) 09/26/2014 1058   HCT 36.1 02/17/2024 2331   HCT 26.9 (L) 09/26/2014 0203   PLT 253 02/17/2024 2331   PLT 108 (L) 12/08/2023 1213   PLT 117 (L) 09/26/2014 0203   MCV 93.3 02/17/2024 2331   MCV 96 09/26/2014 0203   MCH 27.6 02/17/2024 2331   MCHC 29.6 (L) 02/17/2024 2331   RDW 16.4 (H) 02/17/2024 2331   RDW 24.4 (H) 09/26/2014 0203   LYMPHSABS 0.8 02/17/2024 2331   LYMPHSABS 0.5 (L) 12/18/2013 0445   MONOABS 0.3 02/17/2024 2331   MONOABS 0.4 12/18/2013 0445   EOSABS 0.2 02/17/2024 2331   EOSABS 0.1 12/18/2013 0445   BASOSABS 0.1 02/17/2024 2331   BASOSABS 1 09/26/2014 0203   BASOSABS 0.0 12/18/2013 0445      Latest Ref Rng & Units 02/19/2024    6:04 AM 02/18/2024   10:27 AM 02/17/2024   11:31 PM  BMP  Glucose 70 - 99 mg/dL 409  811  914   BUN 8 - 23 mg/dL 17  16  19    Creatinine 0.44 - 1.00 mg/dL 7.82  9.56  2.13   Sodium 135 - 145 mmol/L 137  134  138   Potassium 3.5 - 5.1 mmol/L 3.4  3.8  3.2   Chloride 98 - 111 mmol/L 105  102  104   CO2 22 - 32 mmol/L 23  17  13    Calcium 8.9 - 10.3 mg/dL 8.7  7.7  7.7     CT Angio Chest PE W/Cm &/Or Wo Cm Result Date: 02/18/2024 CLINICAL DATA:  Pulmonary embolus suspected with high probability. Chest pain beginning around 10 o'clock extending to the back. New atrial fibrillation. EXAM: CT ANGIOGRAPHY CHEST WITH CONTRAST TECHNIQUE: Multidetector CT imaging of the chest was performed using the standard protocol during bolus administration of intravenous contrast. Multiplanar CT image reconstructions and MIPs were obtained to  evaluate the vascular anatomy. RADIATION DOSE REDUCTION: This exam was performed according to the departmental dose-optimization program which includes automated exposure control, adjustment of the mA and/or kV according to patient size and/or use of iterative reconstruction technique. CONTRAST:  75mL OMNIPAQUE IOHEXOL 350 MG/ML SOLN COMPARISON:  Chest radiograph 02/17/2024. CTA chest abdomen and pelvis 12/02/2022 FINDINGS: Cardiovascular: Technically adequate study with good opacification of the central and segmental pulmonary arteries. Mild motion artifact. No focal filling defects are demonstrated. No evidence of significant pulmonary embolus. Mild cardiac  enlargement. No pericardial effusions. Normal caliber thoracic aorta. Calcification of the aorta and coronary arteries. Mediastinum/Nodes: Esophagus is decompressed. No significant lymphadenopathy in the chest. Small esophageal hiatal hernia containing fat with paraesophageal soft tissue possibly representing fat necrosis related to the hernia. No change since prior study. Lungs/Pleura: Emphysematous changes in the lungs. Slight fibrosis in the lung bases. No airspace disease or consolidation. No pleural effusion or pneumothorax. Upper Abdomen: Nodular liver contour suggesting cirrhosis. Spleen is mildly enlarged. Cholelithiasis. No inflammatory changes. Musculoskeletal: No chest wall abnormality. No acute or significant osseous findings. Review of the MIP images confirms the above findings. IMPRESSION: 1. No evidence of significant pulmonary embolus. 2. Esophageal hiatal hernia with fluid and infiltration in the paraesophageal fat probably representing fat necrosis. No change. 3. Emphysematous changes and fibrosis in the lungs. No focal consolidation. 4. Nodular liver contour suggesting cirrhosis with mildly enlarged spleen. 5. Cholelithiasis. Electronically Signed   By: Burman Nieves M.D.   On: 02/18/2024 01:55   DG Chest Port 1 View Result Date:  02/17/2024 CLINICAL DATA:  Chest pain EXAM: PORTABLE CHEST 1 VIEW COMPARISON:  11/19/2023 FINDINGS: The heart size and mediastinal contours are within normal limits. Both lungs are clear. The visualized skeletal structures are unremarkable. IMPRESSION: No active disease. Electronically Signed   By: Alcide Clever M.D.   On: 02/17/2024 23:41    Disposition Plan & Communication  Patient status: Inpatient  Admitted From: Home Planned disposition location: Home Anticipated discharge date: 4/1 pending cardiac evaluation   Family Communication: none at bedside    Author: Leeroy Bock, DO [facility] 02/19/2024, 7:45 AM   Available by Epic secure chat 7AM-7PM. If 7PM-7AM, please contact night-coverage.  [facility] contact information found on [email or web address].

## 2024-02-19 NOTE — Progress Notes (Signed)
 Patient Name: Kathryn [name] Date of Encounter: 02/19/2024 [facility] Cardiologist: None  New consult completed by Dr Kirke Corin  Interval Summary  .    Patient seen on a.m. rounds.  Denies any current chest pain but states that she has had chest pressure overnight and denies shortness of breath.  Has been maintained on heparin infusion.  No overnight events that have been recorded.  Vital Signs .    Vitals:   02/19/24 0200 02/19/24 0427 02/19/24 0619 02/19/24 0755  BP:  131/87 (!) 148/80 (!) 141/110  Pulse: 62 63 67 (!) 59  Resp:  18  18  Temp:  98.2 F (36.8 C)  98 F (36.7 C)  TempSrc:      SpO2:  95% 98% 97%  Weight:      Height:        Intake/Output Summary (Last 24 hours) at 02/19/2024 0912 Last data filed at 02/19/2024 0656 Gross per 24 hour  Intake 773.67 ml  Output --  Net 773.67 ml      02/17/2024   11:18 PM 01/20/2024   10:23 AM 12/08/2023   11:18 AM  Last 3 Weights  Weight (lbs) 198 lb 187 lb 12.8 oz 177 lb  Weight (kg) 89.812 kg 85.186 kg 80.287 kg      Telemetry/ECG    Sinus rhythm rates in the 60s, converted from atrial fibrillation the afternoon of 02/18/2024 while in the emergency department- Personally Reviewed  Physical Exam .   GEN: No acute distress.   Neck: No JVD Cardiac: RRR, I/VI systolic murmur RUSB without rubs or gallops.  Respiratory: Clear to auscultation bilaterally.  Respirations are unlabored at rest on room air GI: Soft, nontender, non-distended  MS: No edema  Assessment & Plan .     New onset atrial fibrillation with RVR -Presented in atrial fibrillation RVR with rates of 115 bpm -Received several boluses of IV diltiazem and transition to oral diltiazem in the emergency department -Converted to sinus rhythm last evening on 02/18/2024 -Continued on diltiazem 30 mg every 6 hours can consolidate to daily dosing prior to discharge -Currently on heparin infusion for CHA2DS2-VASc of at least 3 for stroke  prophylaxis -Echocardiogram ordered and pending with further recommendations to follow -TSH 9.815 -Continue on telemetry monitoring  NSTEMI -Patient presented with chest pain that she states radiated to her back and wraps around the right side of her chest and epigastric area feeling a stabbing sensation with associated shortness of breath -Currently remains chest pain-free -High-sensitivity troponin trended 290, 804, 3476 -Continued on heparin infusion -Continued on aspirin 81 mg daily -Echocardiogram ordered and pending with further recommendations to follow -Tentatively scheduled for left heart catheterization on Monday with Dr. Okey Dupre -N.p.o. 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

## 2024-02-20 ENCOUNTER — Inpatient Hospital Stay (HOSPITAL_COMMUNITY)
Admit: 2024-02-20 | Discharge: 2024-02-20 | Disposition: A | Discharge status: Home / Self Care | Attending: Internal Medicine | Admitting: Internal Medicine

## 2024-02-20 DIAGNOSIS — K7030 Alcoholic cirrhosis of liver without ascites: Secondary | ICD-10-CM | POA: Diagnosis not present

## 2024-02-20 DIAGNOSIS — I4891 Unspecified atrial fibrillation: Secondary | ICD-10-CM | POA: Diagnosis not present

## 2024-02-20 DIAGNOSIS — I214 Non-ST elevation (NSTEMI) myocardial infarction: Secondary | ICD-10-CM

## 2024-02-20 LAB — CBC
HCT: 33 % — ABNORMAL LOW (ref 36.0–46.0)
Hemoglobin: 10.7 g/dL — ABNORMAL LOW (ref 12.0–15.0)
MCH: 28.4 pg (ref 26.0–34.0)
MCHC: 32.4 g/dL (ref 30.0–36.0)
MCV: 87.5 fL (ref 80.0–100.0)
Platelets: 131 10*3/uL — ABNORMAL LOW (ref 150–400)
RBC: 3.77 MIL/uL — ABNORMAL LOW (ref 3.87–5.11)
RDW: 16.3 % — ABNORMAL HIGH (ref 11.5–15.5)
WBC: 3.7 10*3/uL — ABNORMAL LOW (ref 4.0–10.5)
nRBC: 1.1 % — ABNORMAL HIGH (ref 0.0–0.2)

## 2024-02-20 LAB — ECHOCARDIOGRAM COMPLETE
AR max vel: 2.19 cm2
AV Peak grad: 6.8 mmHg
Ao pk vel: 1.3 m/s
Area-P 1/2: 3.06 cm2
Height: 64 in
S' Lateral: 2.8 cm
Weight: 3168 [oz_av]

## 2024-02-20 LAB — HEPARIN LEVEL (UNFRACTIONATED): Heparin Unfractionated: 0.47 [IU]/mL (ref 0.30–0.70)

## 2024-02-20 MED ORDER — SODIUM CHLORIDE 0.9 % IV SOLN: INTRAVENOUS | Status: DC

## 2024-02-20 MED ORDER — ASPIRIN 81 MG PO CHEW
81.0000 mg | CHEWABLE_TABLET | ORAL | Status: AC
  Administered 2024-02-21: 81 mg via ORAL
  Filled 2024-02-20: qty 1

## 2024-02-20 MED ORDER — ENSURE ENLIVE PO LIQD
237.0000 mL | Freq: Two times a day (BID) | ORAL | Status: DC
  Administered 2024-02-20 – 2024-02-22 (×2): 237 mL via ORAL

## 2024-02-20 NOTE — H&P (View-Only) (Signed)
   Rounding Note    Patient Name: Kathryn [name] Date of Encounter: 02/20/2024  [facility] Cardiologist: New to [facility]  Subjective   No acute events overnight. Had some nausea/emesis with eating yesterday but no problems today. Reviewed plans for heart cath tomorrow, she is amenable.  Inpatient Medications    Scheduled Meds:  aspirin EC  81 mg Oral Daily   diltiazem  30 mg Oral Q6H   feeding supplement  237 mL Oral BID BM   folic acid  1 mg Oral Daily   levothyroxine  125 mcg Oral Q0600   multivitamin with minerals  1 tablet Oral Daily   nadolol  40 mg Oral Daily   pantoprazole  40 mg Oral BID   thiamine  100 mg Oral Daily   Or   thiamine  100 mg Intravenous Daily   Continuous Infusions:  heparin 900 Units/hr (02/20/24 0053)   PRN Meds: acetaminophen, LORazepam **OR** LORazepam, nitroGLYCERIN, ondansetron (ZOFRAN) IV, oxyCODONE-acetaminophen   Vital Signs    Vitals:   02/19/24 2023 02/20/24 0322 02/20/24 0850 02/20/24 1229  BP: (!) 134/99 (!) 147/87 (!) 153/89 (!) 163/99  Pulse: 65 60 62 62  Resp:      Temp: 98 F (36.7 C) 98.3 F (36.8 C) 98.3 F (36.8 C) 98.3 F (36.8 C)  TempSrc: Oral Oral Oral Oral  SpO2: 98% 96% 98% 100%  Weight:      Height:        Intake/Output Summary (Last 24 hours) at 02/20/2024 1501 Last data filed at 02/20/2024 0900 Gross per 24 hour  Intake 161.54 ml  Output --  Net 161.54 ml      02/17/2024   11:18 PM 01/20/2024   10:23 AM 12/08/2023   11:18 AM  Last 3 Weights  Weight (lbs) 198 lb 187 lb 12.8 oz 177 lb  Weight (kg) 89.812 kg 85.186 kg 80.287 kg      Telemetry    SR - Personally Reviewed  Physical Exam   GEN: No acute distress.   Neck: No JVD Cardiac: RRR, no  rubs, or gallops. 1-2/6 systolic murmur Respiratory: Clear to auscultation bilaterally. GI: Soft, nontender, non-distended  MS: No edema; No deformity. Neuro:  Nonfocal  Psych: Normal affect   New pertinent results (labs, ECG, imaging,  cardiac studies)    Echo personally read, severe basal septal hypertrophy without significant gradient, normal LVEF without wall motion abnormalities, moderate MR, mild-moderate TR, elevated RVSP  Assessment & Plan    NSTEMI -no further chest pain -hsTn trended up significantly -on heparin -continue aspirin 81 mg daily -echo as above -planned for cath 3/31, she is amenable.NPO at midnight   New atrial fibrillation, presenting with RVR, spontaneously converted -CHA2DS2/VAS Stroke Risk Points=4, but elevated bleeding risk with history of esophageal varices -may be a candidate for Watchman as an outpatient  -currently on heparin without bleeding thus far -echo as above, preserved EF -on nadolol given varices    Alcoholic cirrhosis, portal vein/SMV thrombosis, esophageal varices with remote prior banding, severe portal hypertensive gastropathy with APC 11/2022 -on nadolol -denies current bleeding, but likely not a good candidate for long term anticoagulation. On chart review, was previously on apixaban for thrombosis--notes mention both nonadherence and GI bleeding as reason for discontinuation, unclear  -EtOH level >200 on presentation    Signed, Jodelle Red, MD  02/20/2024, 3:01 PM

## 2024-02-20 NOTE — Progress Notes (Signed)
 PHARMACY - ANTICOAGULATION CONSULT NOTE  Pharmacy Consult for Heparin  Indication: chest pain/ACS  Allergies  Allergen Reactions   Oxycodone-Acetaminophen    Hydrocodone-Acetaminophen Other (See Comments)    Reaction: pt can't take anything with tylenol due to her liver disease.    Ibuprofen Other (See Comments)    Esophageal varices   Naproxen Other (See Comments)    Esophageal varices   Propoxyphene Other (See Comments)    Reaction: pt isn't sure, but knows she can't take it.    Shellfish Allergy Hives   Tamiflu  [Oseltamivir Phosphate] Other (See Comments)    Esophageal varices    Patient Measurements: Height: 5\' 4"  (162.6 cm) Weight: 89.8 kg (198 lb) IBW/kg (Calculated) : 54.7 HEPARIN DW (KG): 74.8  Vital Signs: Temp: 98.3 F (36.8 C) (03/30 0322) Temp Source: Oral (03/30 0322) BP: 147/87 (03/30 0322) Pulse Rate: 60 (03/30 0322)  Labs: Recent Labs    02/17/24 2331 02/18/24 0158 02/18/24 0813 02/18/24 1027 02/18/24 1105 02/18/24 1811 02/19/24 0604 02/20/24 0520  HGB 10.7*  --   --   --   --   --  9.5* 10.7*  HCT 36.1  --   --   --   --   --  29.8* 33.0*  PLT 253  --   --   --   --   --  144* 131*  HEPARINUNFRC  --   --    < >  --    < > 0.45 0.51 0.47  CREATININE 1.27*  --   --  0.94  --   --  0.88  --   TROPONINIHS 290* 804*  --  3,476*  --   --   --   --    < > = values in this interval not displayed.    Estimated Creatinine Clearance: 67.3 mL/min (by C-G formula based on SCr of 0.88 mg/dL).   Medical History: Past Medical History:  Diagnosis Date   Alcoholic cirrhosis of liver (HCC)    Asthma    CHF (congestive heart failure) (HCC)    Chronic disease anemia    Esophageal varices (HCC)    GR I on EGD by Dr Shelle Iron 09/2014   ETOH abuse    Hypertension    Hypothyroidism    Murmur    Portal hypertension (HCC)    Sarcoidosis     Medications:  Medications Prior to Admission  Medication Sig Dispense Refill Last Dose/Taking   albuterol (VENTOLIN  HFA) 108 (90 Base) MCG/ACT inhaler Inhale 1-2 puffs into the lungs every 4 (four) hours as needed for wheezing or shortness of breath.   Taking As Needed   B Complex-C-Folic Acid (B COMPLEX-VITAMIN C-FOLIC ACID) 1 MG tablet Take 1 tablet by mouth daily with breakfast. (Patient not taking: Reported on 02/18/2024) 90 tablet 3 Not Taking   ferrous sulfate 325 (65 FE) MG tablet Take 1 tablet (325 mg total) by mouth daily with breakfast. (Patient not taking: Reported on 02/18/2024) 30 tablet 3 Not Taking   folic acid (FOLVITE) 1 MG tablet Take 1 tablet (1 mg total) by mouth daily. (Patient not taking: Reported on 02/18/2024) 30 tablet 2 Not Taking   hydrALAZINE (APRESOLINE) 25 MG tablet Take 1 tablet (25 mg total) by mouth every 8 (eight) hours. (Patient not taking: Reported on 02/18/2024) 90 tablet 2 Not Taking   levothyroxine (SYNTHROID) 125 MCG tablet Take 125 mcg by mouth daily. (Patient not taking: Reported on 02/18/2024)   Not Taking  Multiple Vitamin (MULTIVITAMIN) tablet Take 1 tablet by mouth every other day. (Patient not taking: Reported on 02/18/2024)   Not Taking   nadolol (CORGARD) 40 MG tablet Take 1 tablet (40 mg total) by mouth daily. (Patient not taking: Reported on 02/18/2024) 30 tablet 2 Not Taking   naltrexone (DEPADE) 50 MG tablet Take 50 mg by mouth daily. (Patient not taking: Reported on 02/18/2024)   Not Taking   omeprazole (PRILOSEC) 40 MG capsule Take 40 mg by mouth 2 (two) times daily. (Patient not taking: Reported on 02/18/2024)   Not Taking   spironolactone (ALDACTONE) 50 MG tablet Take 50 mg by mouth daily. (Patient not taking: Reported on 02/18/2024)   Not Taking    Assessment: Pharmacy consulted to dose heparin in this 68 year old female admitted with ACS/NSTEMI. PMH includes CHF, chronic anemia, HTN, murmur, portal hypertension, sarcoidosis. cTn trending up. No prior anticoag noted.   Goal of Therapy:  Heparin level 0.3-0.7 units/ml Monitor platelets by anticoagulation protocol:  Yes  Date Time aPTT/HL Rate/Comment 0328 1105 HL 0.55 Therapeutic x 1 0328 1811 HL 0.45 Therapeutic x 2 0329    0604   HL  0.51           Therapeutic X 3  0330    0520   HL  0.47           Therapeutic X 4    Plan:  Continue heparin infusion at 900 units/hr Next heparin level check with am labs CBC daily while on heparin infusion  Kathryn [name] D Clinical Pharmacist 02/20/2024

## 2024-02-20 NOTE — Progress Notes (Signed)
 [name]    DOB: [birth year], 68 y.o.  VZD:[id number]    Code Status: Full Code   DOA: 02/17/2024   LOS: 2   Brief hospital course  Kathryn [name] is a 68yo female with PMH of Alcohol use disorder, cirrhosis with portal vein thrombosis on Eliquis, esophageal varices, HTN, hypothyroidism who presents to the ED by EMS with a complaint of chest pain.   ED course: Afebrile, heart rate in the 130s BP 97/58, O2 sat 100% on room air. Troponin 290> 800>>3,476, BNP 278 WBC 3700 and hemoglobin 10.7, Potassium 3.2, magnesium 1.8 Creatinine at baseline at 1.27 but with bicarb of 13 and anion gap 21 On arrival blood glucose was 36-> 42-> 182 following D50 TSH up at 9.81 and T4 down at 0.51 Hepatic function panel and lipase WNL EtOH 219 EKG: Afib RVR  Treated with Heparin infusion, Cardizem boluses. Cardiology consulted.   presently- chest pain free. cardiology consulted for Afib, NSTEMI. Remains stable on heparin gtt over the weekend and planning cath Monday  Assessment & Plan  Principal Problem:   Atrial fibrillation with rapid ventricular response (HCC) Active Problems:   Hypoglycemia   High anion gap metabolic acidosis   NSTEMI (non-ST elevated myocardial infarction) (HCC)   Esophageal varices (HCC)   Portal vein thrombosis   Alcoholic cirrhosis of liver (HCC)   Alcohol use disorder   Hypertension   Hypothyroidism   CKD (chronic kidney disease), stage IIIa  Atrial fibrillation with RVR- HR in 60s. Echo shows EF 65-70%. severe asymmetric left ventricular hypertrophy of the basal-septal segment.  moderately elevated pulmonary artery systolic  pressure. The estimated right ventricular systolic pressure is 49.8 mmHg  Moderate mitral valve regurgitation.  - cardiology following -On nadolol for varices - continue diltizem - continue heparin gtt, will need to discuss long-term anticoagulation options given h/o esophageal varices.    NSTEMI - Patient presents with chest  pain, no acute EKG changes but troponin 290---> 3,476 - continue heparin - cath on Monday - management per cards   Hypokalemia- replace K+ and check Mg++  High anion gap metabolic acidosis- resolved with IV fluids   Hypoglycemia- asymptomatic and resolved with IVF with dextrose   Alcoholic cirrhosis of liver (HCC) Portal vein thrombosis Esophageal varices Continue nadolol and spironolactone Patient denies vomiting, blood in stool or melena  Thrombocytopenia- mild. No acute bleeding. In relation to liver disease.  - CBC am   Alcohol use disorder Alcohol intoxication EtOH level over 200 on presentation CIWA withdrawal protocol   Hypertension- titrate back on as tolerated given initial hypotension   CKD (chronic kidney disease), stage IIIa Renal function at baseline   Hypothyroidism Resume home levothyroxine 125 TSH up at 9.81 and T4 down at 0.51  Body mass index is 33.99 kg/m.  VTE ppx: heparin gtt  Diet:     Diet   Diet Heart Room service appropriate? Yes; Fluid consistency: Thin   Consultants: Cardiology   Subjective 02/20/24    Pt reports no concerns today. We discussed what a heart cath is.    Objective   Vitals:   02/19/24 1345 02/19/24 1754 02/19/24 2023 02/20/24 0322  BP: (!) 146/88 (!) 170/85 (!) 134/99 (!) 147/87  Pulse: 62 61 65 60  Resp:      Temp:  97.6 F (36.4 C) 98 F (36.7 C) 98.3 F (36.8 C)  TempSrc:  Axillary Oral Oral  SpO2:  100% 98% 96%  Weight:  Height:        Intake/Output Summary (Last 24 hours) at 02/20/2024 0742 Last data filed at 02/20/2024 0053 Gross per 24 hour  Intake 161.54 ml  Output --  Net 161.54 ml   Filed Weights   02/17/24 2318  Weight: 89.8 kg    Physical Exam:  General: awake, alert, NAD HEENT: atraumatic, clear conjunctiva, anicteric sclera, MMM, hearing grossly normal Respiratory: normal respiratory effort. CTAB Cardiovascular: quick capillary refill, normal S1/S2, RRR, no JVD,  murmurs Nervous: A&O x3. no gross focal neurologic deficits, normal speech Extremities: moves all equally, no edema, normal tone Skin: dry, intact, normal temperature, normal color. No rashes, lesions or ulcers on exposed skin Psychiatry: normal mood, congruent affect  Labs   I have personally reviewed the following labs and imaging studies CBC    Component Value Date/Time   WBC 3.7 (L) 02/20/2024 0520   RBC 3.77 (L) 02/20/2024 0520   HGB 10.7 (L) 02/20/2024 0520   HGB 10.6 (L) 12/08/2023 1213   HGB 9.9 (L) 09/26/2014 1058   HCT 33.0 (L) 02/20/2024 0520   HCT 26.9 (L) 09/26/2014 0203   PLT 131 (L) 02/20/2024 0520   PLT 108 (L) 12/08/2023 1213   PLT 117 (L) 09/26/2014 0203   MCV 87.5 02/20/2024 0520   MCV 96 09/26/2014 0203   MCH 28.4 02/20/2024 0520   MCHC 32.4 02/20/2024 0520   RDW 16.3 (H) 02/20/2024 0520   RDW 24.4 (H) 09/26/2014 0203   LYMPHSABS 0.8 02/17/2024 2331   LYMPHSABS 0.5 (L) 12/18/2013 0445   MONOABS 0.3 02/17/2024 2331   MONOABS 0.4 12/18/2013 0445   EOSABS 0.2 02/17/2024 2331   EOSABS 0.1 12/18/2013 0445   BASOSABS 0.1 02/17/2024 2331   BASOSABS 1 09/26/2014 0203   BASOSABS 0.0 12/18/2013 0445      Latest Ref Rng & Units 02/19/2024    6:04 AM 02/18/2024   10:27 AM 02/17/2024   11:31 PM  BMP  Glucose 70 - 99 mg/dL 409  811  914   BUN 8 - 23 mg/dL 17  16  19    Creatinine 0.44 - 1.00 mg/dL 7.82  9.56  2.13   Sodium 135 - 145 mmol/L 137  134  138   Potassium 3.5 - 5.1 mmol/L 3.4  3.8  3.2   Chloride 98 - 111 mmol/L 105  102  104   CO2 22 - 32 mmol/L 23  17  13    Calcium 8.9 - 10.3 mg/dL 8.7  7.7  7.7     No results found.   Disposition Plan & Communication  Patient status: Inpatient  Admitted From: Home Planned disposition location: Home Anticipated discharge date: 4/1 pending cardiac evaluation   Family Communication: none at bedside    Author: Leeroy Bock, DO [facility] 02/20/2024, 7:42 AM   Available by Epic secure chat  7AM-7PM. If 7PM-7AM, please contact night-coverage.  [facility] contact information found on [email or web address].

## 2024-02-20 NOTE — Progress Notes (Signed)
 Initial Nutrition Assessment  DOCUMENTATION CODES:   Not applicable  INTERVENTION:  Continue folic acid, thiamine, and MVI daily Encourage good PO intake Ensure Enlive po BID, each supplement provides 350 kcal and 20 grams of protein.   NUTRITION DIAGNOSIS:   Increased nutrient needs related to chronic illness as evidenced by estimated needs.  GOAL:   Patient will meet greater than or equal to 90% of their needs  MONITOR:   PO intake, Labs, I & O's, Weight trends, Supplement acceptance  REASON FOR ASSESSMENT:   Consult Assessment of nutrition requirement/status, Diet education  ASSESSMENT:   68 y.o. female presented to the ED with chest pain. PMH includes cirrhosis 2/2 EtOH abuse, HTN, Hypothyroidism, CKD IIIa, and CHF. Pt admitted with A. Fib with RVR and NSTEMI.   3/27 - Admitted  RD working remotely at time of assessment. RD unable to reach pt via phone in room.  Plan for cath on Monday. RD to order ONS to ensure good nutrition prior to procedure and chronic illnesses.   Meal Intake 3/30 - 90% x 1 meal   Pt weight continues to trend upwards over the past 6 months, with a total 10.4 kg weight gain. Current weight within chart appears stated versus actual. Pt also noted with mild edema in BLE.  Medications reviewed and include: Folic acid, MVI, Protonix, Thiamine, Heparin drip Labs reviewed.   NUTRITION - FOCUSED PHYSICAL EXAM:  Deferred to follow-up.  Diet Order:   Diet Order             Diet Heart Room service appropriate? Yes; Fluid consistency: Thin  Diet effective now                   EDUCATION NEEDS:   No education needs have been identified at this time  Skin:  Skin Assessment: Reviewed RN Assessment  Last BM:  3/28  Height:  Ht Readings from Last 1 Encounters:  02/17/24 5\' 4"  (1.626 m)   Weight:  Wt Readings from Last 1 Encounters:  02/17/24 89.8 kg   Ideal Body Weight:  54.6 kg  BMI:  Body mass index is 33.99  kg/m.  Estimated Nutritional Needs:  Kcal:  1800-2000 Protein:  90-110 grams Fluid:  >/= 1.8 L   Kirby Crigler RD, LDN Clinical Dietitian

## 2024-02-20 NOTE — Progress Notes (Signed)
   Rounding Note    Patient Name: Kathryn [name] Date of Encounter: 02/20/2024  [facility] Cardiologist: New to [facility]  Subjective   No acute events overnight. Had some nausea/emesis with eating yesterday but no problems today. Reviewed plans for heart cath tomorrow, she is amenable.  Inpatient Medications    Scheduled Meds:  aspirin EC  81 mg Oral Daily   diltiazem  30 mg Oral Q6H   feeding supplement  237 mL Oral BID BM   folic acid  1 mg Oral Daily   levothyroxine  125 mcg Oral Q0600   multivitamin with minerals  1 tablet Oral Daily   nadolol  40 mg Oral Daily   pantoprazole  40 mg Oral BID   thiamine  100 mg Oral Daily   Or   thiamine  100 mg Intravenous Daily   Continuous Infusions:  heparin 900 Units/hr (02/20/24 0053)   PRN Meds: acetaminophen, LORazepam **OR** LORazepam, nitroGLYCERIN, ondansetron (ZOFRAN) IV, oxyCODONE-acetaminophen   Vital Signs    Vitals:   02/19/24 2023 02/20/24 0322 02/20/24 0850 02/20/24 1229  BP: (!) 134/99 (!) 147/87 (!) 153/89 (!) 163/99  Pulse: 65 60 62 62  Resp:      Temp: 98 F (36.7 C) 98.3 F (36.8 C) 98.3 F (36.8 C) 98.3 F (36.8 C)  TempSrc: Oral Oral Oral Oral  SpO2: 98% 96% 98% 100%  Weight:      Height:        Intake/Output Summary (Last 24 hours) at 02/20/2024 1501 Last data filed at 02/20/2024 0900 Gross per 24 hour  Intake 161.54 ml  Output --  Net 161.54 ml      02/17/2024   11:18 PM 01/20/2024   10:23 AM 12/08/2023   11:18 AM  Last 3 Weights  Weight (lbs) 198 lb 187 lb 12.8 oz 177 lb  Weight (kg) 89.812 kg 85.186 kg 80.287 kg      Telemetry    SR - Personally Reviewed  Physical Exam   GEN: No acute distress.   Neck: No JVD Cardiac: RRR, no  rubs, or gallops. 1-2/6 systolic murmur Respiratory: Clear to auscultation bilaterally. GI: Soft, nontender, non-distended  MS: No edema; No deformity. Neuro:  Nonfocal  Psych: Normal affect   New pertinent results (labs, ECG, imaging,  cardiac studies)    Echo personally read, severe basal septal hypertrophy without significant gradient, normal LVEF without wall motion abnormalities, moderate MR, mild-moderate TR, elevated RVSP  Assessment & Plan    NSTEMI -no further chest pain -hsTn trended up significantly -on heparin -continue aspirin 81 mg daily -echo as above -planned for cath 3/31, she is amenable.NPO at midnight   New atrial fibrillation, presenting with RVR, spontaneously converted -CHA2DS2/VAS Stroke Risk Points=4, but elevated bleeding risk with history of esophageal varices -may be a candidate for Watchman as an outpatient  -currently on heparin without bleeding thus far -echo as above, preserved EF -on nadolol given varices    Alcoholic cirrhosis, portal vein/SMV thrombosis, esophageal varices with remote prior banding, severe portal hypertensive gastropathy with APC 11/2022 -on nadolol -denies current bleeding, but likely not a good candidate for long term anticoagulation. On chart review, was previously on apixaban for thrombosis--notes mention both nonadherence and GI bleeding as reason for discontinuation, unclear  -EtOH level >200 on presentation    Signed, Jodelle Red, MD  02/20/2024, 3:01 PM

## 2024-02-20 NOTE — Progress Notes (Signed)
  2D Echocardiogram has been performed.  Kathryn [name] 02/20/2024, 11:21 AM

## 2024-02-21 ENCOUNTER — Encounter
Admission: EM | Disposition: A | Discharge status: Home / Self Care | Payer: Self-pay | Source: Home / Self Care | Attending: Student in an Organized Health Care Education/Training Program

## 2024-02-21 DIAGNOSIS — I2510 Atherosclerotic heart disease of native coronary artery without angina pectoris: Secondary | ICD-10-CM

## 2024-02-21 DIAGNOSIS — I4891 Unspecified atrial fibrillation: Secondary | ICD-10-CM | POA: Diagnosis not present

## 2024-02-21 DIAGNOSIS — I214 Non-ST elevation (NSTEMI) myocardial infarction: Secondary | ICD-10-CM | POA: Diagnosis not present

## 2024-02-21 DIAGNOSIS — K7030 Alcoholic cirrhosis of liver without ascites: Secondary | ICD-10-CM | POA: Diagnosis not present

## 2024-02-21 DIAGNOSIS — I2489 Other forms of acute ischemic heart disease: Secondary | ICD-10-CM

## 2024-02-21 HISTORY — PX: LEFT HEART CATH AND CORONARY ANGIOGRAPHY: CATH118249

## 2024-02-21 LAB — CBC
HCT: 29.5 % — ABNORMAL LOW (ref 36.0–46.0)
Hemoglobin: 9.5 g/dL — ABNORMAL LOW (ref 12.0–15.0)
MCH: 27.5 pg (ref 26.0–34.0)
MCHC: 32.2 g/dL (ref 30.0–36.0)
MCV: 85.5 fL (ref 80.0–100.0)
Platelets: 109 10*3/uL — ABNORMAL LOW (ref 150–400)
RBC: 3.45 MIL/uL — ABNORMAL LOW (ref 3.87–5.11)
RDW: 16.4 % — ABNORMAL HIGH (ref 11.5–15.5)
WBC: 3 10*3/uL — ABNORMAL LOW (ref 4.0–10.5)
nRBC: 1.3 % — ABNORMAL HIGH (ref 0.0–0.2)

## 2024-02-21 LAB — LIPOPROTEIN A (LPA): Lipoprotein (a): 43.4 nmol/L — ABNORMAL HIGH (ref <75.0)

## 2024-02-21 LAB — HEPARIN LEVEL (UNFRACTIONATED): Heparin Unfractionated: 0.44 [IU]/mL (ref 0.30–0.70)

## 2024-02-21 LAB — GLUCOSE, CAPILLARY
Glucose-Capillary: 87 mg/dL (ref 70–99)
Glucose-Capillary: 91 mg/dL (ref 70–99)

## 2024-02-21 SURGERY — LEFT HEART CATH AND CORONARY ANGIOGRAPHY
Anesthesia: Moderate Sedation

## 2024-02-21 MED ORDER — VERAPAMIL HCL 2.5 MG/ML IV SOLN
INTRAVENOUS | Status: DC | PRN
  Administered 2024-02-21 (×2): 2.5 mg via INTRAVENOUS

## 2024-02-21 MED ORDER — ACETAMINOPHEN 325 MG PO TABS: 650.0000 mg | ORAL_TABLET | ORAL | Status: DC | PRN

## 2024-02-21 MED ORDER — MIDAZOLAM HCL 2 MG/2ML IJ SOLN
INTRAMUSCULAR | Status: DC | PRN
  Administered 2024-02-21: .5 mg via INTRAVENOUS

## 2024-02-21 MED ORDER — SODIUM CHLORIDE 0.9% FLUSH
3.0000 mL | Freq: Two times a day (BID) | INTRAVENOUS | Status: DC
  Administered 2024-02-21 – 2024-02-22 (×2): 3 mL via INTRAVENOUS

## 2024-02-21 MED ORDER — HEPARIN SODIUM (PORCINE) 1000 UNIT/ML IJ SOLN
INTRAMUSCULAR | Status: AC
  Filled 2024-02-21: qty 10

## 2024-02-21 MED ORDER — SODIUM CHLORIDE 0.9 % IV SOLN: 250.0000 mL | INTRAVENOUS | Status: DC | PRN

## 2024-02-21 MED ORDER — HYDRALAZINE HCL 20 MG/ML IJ SOLN: 10.0000 mg | INTRAMUSCULAR | Status: AC | PRN

## 2024-02-21 MED ORDER — FENTANYL CITRATE (PF) 100 MCG/2ML IJ SOLN
INTRAMUSCULAR | Status: DC | PRN
  Administered 2024-02-21: 12.5 ug via INTRAVENOUS

## 2024-02-21 MED ORDER — MIDAZOLAM HCL 2 MG/2ML IJ SOLN
INTRAMUSCULAR | Status: AC
Start: 2024-02-21 — End: ?
  Filled 2024-02-21: qty 2

## 2024-02-21 MED ORDER — HEPARIN SODIUM (PORCINE) 1000 UNIT/ML IJ SOLN
INTRAMUSCULAR | Status: DC | PRN
  Administered 2024-02-21: 4000 [IU] via INTRAVENOUS

## 2024-02-21 MED ORDER — HEPARIN (PORCINE) 25000 UT/250ML-% IV SOLN
1050.0000 [IU]/h | INTRAVENOUS | Status: DC
  Administered 2024-02-21: 900 [IU]/h via INTRAVENOUS
  Filled 2024-02-21: qty 250

## 2024-02-21 MED ORDER — LIDOCAINE HCL 1 % IJ SOLN
INTRAMUSCULAR | Status: AC
Start: 2024-02-21 — End: ?
  Filled 2024-02-21: qty 20

## 2024-02-21 MED ORDER — FENTANYL CITRATE (PF) 100 MCG/2ML IJ SOLN
INTRAMUSCULAR | Status: AC
  Filled 2024-02-21: qty 2

## 2024-02-21 MED ORDER — IOHEXOL 300 MG/ML  SOLN
INTRAMUSCULAR | Status: DC | PRN
  Administered 2024-02-21: 52 mL

## 2024-02-21 MED ORDER — ATORVASTATIN CALCIUM 10 MG PO TABS
10.0000 mg | ORAL_TABLET | Freq: Every day | ORAL | Status: DC
  Administered 2024-02-21 – 2024-02-22 (×2): 10 mg via ORAL
  Filled 2024-02-21 (×2): qty 1

## 2024-02-21 MED ORDER — LIDOCAINE HCL (PF) 1 % IJ SOLN
INTRAMUSCULAR | Status: DC | PRN
  Administered 2024-02-21: 5 mL

## 2024-02-21 MED ORDER — LABETALOL HCL 5 MG/ML IV SOLN: 10.0000 mg | INTRAVENOUS | Status: AC | PRN

## 2024-02-21 MED ORDER — HEPARIN (PORCINE) IN NACL 2000-0.9 UNIT/L-% IV SOLN
INTRAVENOUS | Status: DC | PRN
  Administered 2024-02-21: 1000 mL

## 2024-02-21 MED ORDER — SODIUM CHLORIDE 0.9 % IV SOLN: INTRAVENOUS | Status: DC

## 2024-02-21 MED ORDER — VERAPAMIL HCL 2.5 MG/ML IV SOLN
INTRAVENOUS | Status: AC
Start: 2024-02-21 — End: ?
  Filled 2024-02-21: qty 2

## 2024-02-21 MED ORDER — SODIUM CHLORIDE 0.9% FLUSH: 3.0000 mL | INTRAVENOUS | Status: DC | PRN

## 2024-02-21 MED ORDER — HEPARIN (PORCINE) IN NACL 1000-0.9 UT/500ML-% IV SOLN
INTRAVENOUS | Status: AC
  Filled 2024-02-21: qty 1000

## 2024-02-21 SURGICAL SUPPLY — 11 items
CATH INFINITI AMBI 5FR TG (CATHETERS) IMPLANT
CATH INFINITI JR4 5F (CATHETERS) IMPLANT
DEVICE RAD TR BAND REGULAR (VASCULAR PRODUCTS) IMPLANT
DRAPE BRACHIAL (DRAPES) IMPLANT
GLIDESHEATH SLEND SS 6F .021 (SHEATH) IMPLANT
GUIDEWIRE INQWIRE 1.5J.035X260 (WIRE) IMPLANT
INQWIRE 1.5J .035X260CM (WIRE) ×1 IMPLANT
PACK CARDIAC CATH (CUSTOM PROCEDURE TRAY) ×1 IMPLANT
PROTECTION STATION PRESSURIZED (MISCELLANEOUS) ×1 IMPLANT
SET ATX-X65L (MISCELLANEOUS) IMPLANT
STATION PROTECTION PRESSURIZED (MISCELLANEOUS) IMPLANT

## 2024-02-21 NOTE — Progress Notes (Signed)
   Patient Name: Kathryn [name] Date of Encounter: 02/21/2024 [facility] Cardiologist: New - Kirke Corin  Interval Summary  .    Feeling well without chest pain or shortness of breath.  No palpitations.  Vital Signs .    Vitals:   02/21/24 0757 02/21/24 1246 02/21/24 1443 02/21/24 1617  BP: (!) 154/80 (!) 182/84 (!) 157/81   Pulse: (!) 57 (!) 58 68   Resp: 18 18 15    Temp: 98.1 F (36.7 C) 98.3 F (36.8 C) 98.5 F (36.9 C)   TempSrc: Oral Oral Oral   SpO2: 99% 100% 97% 100%  Weight:      Height:       No intake or output data in the 24 hours ending 02/21/24 1644    02/20/2024    8:12 PM 02/17/2024   11:18 PM 01/20/2024   10:23 AM  Last 3 Weights  Weight (lbs) 186 lb 8.2 oz 198 lb 187 lb 12.8 oz  Weight (kg) 84.6 kg 89.812 kg 85.186 kg      Telemetry/ECG    NSR and sinus bradycardia - Personally Reviewed  Physical Exam .   GEN: No acute distress.   Neck: No JVD Cardiac: RRR, no murmurs, rubs, or gallops.  Respiratory: Clear to auscultation bilaterally. GI: Soft, nontender, non-distended  MS: No edema  Assessment & Plan .     Demand ischemia: Patient presented with chest pain and was found to be in atrial fibrillation with rapid ventricular response.  She also had elevated troponin, still rising on last check 3 days ago at 3476.  Catheterization today shows mild to moderate, nonobstructive CAD consistent with supply-demand mismatch. -Resume heparin infusion 2 hours after TR band removal given atrial fibrillation on admission.  Will need to readdress long-term anticoagulation/antiplatelet strategy tomorrow in the setting of the patient's liver disease and esophageal varices. -Start low-dose atorvastatin for target LDL less than 70 with close monitoring of liver function.  Atrial fibrillation with rapid ventricular response: Patient has been maintaining sinus rhythm over the last 24.  The only, she would be on anticoagulation with a CHA2DS2-VASc score of at least  4.  However, this is complicated by her history of cirrhosis with ongoing alcohol abuse, portal hypertension, and esophageal varices status post banding with report of prior GI bleed in the setting of anticoagulation. -Resume heparin infusion 2 hours after TR band removal following today's catheterization. -Readdress risks and benefits of long-term anticoagulation with the patient tomorrow.  Based on the prognosis of her liver disease and ability to quit drinking, she may be a left atrial appendage occlusion.  Diastolic dysfunction: LVEF normal by echo with mildly elevated LVEDP on catheterization today.  Patient appears euvolemic on examination. -Maintain net even to slightly negative balance.  Alcoholic cirrhosis with esophageal varices: Patient has continued to drink but wishes to quit.  Long-term anticoagulation strategy complicated by portal hypertension and history of GI bleeding. -Encourage alcohol cessation. -Continue CIWA protocol. -Readdress long-term coagulation strategy tomorrow.  For questions or updates, please contact [facility] Please consult [email or web address] for contact info under [facility].    Signed, Yvonne Kendall, MD

## 2024-02-21 NOTE — Plan of Care (Signed)
  Problem: Education: Goal: Understanding of cardiac disease, CV risk reduction, and recovery process will improve Outcome: Progressing   Problem: Activity: Goal: Ability to tolerate increased activity will improve Outcome: Progressing   Problem: Cardiac: Goal: Ability to achieve and maintain adequate cardiovascular perfusion will improve Outcome: Progressing   Problem: Health Behavior/Discharge Planning: Goal: Ability to safely manage health-related needs after discharge will improve Outcome: Progressing   Problem: Education: Goal: Knowledge of General Education information will improve Description: Including pain rating scale, medication(s)/side effects and non-pharmacologic comfort measures Outcome: Progressing

## 2024-02-21 NOTE — Care Management Important Message (Signed)
 Important Message  Patient Details  Name: Kathryn [name] MRN: [id number] Date of Birth: [birth year]   Important Message Given:  Yes - Medicare IM     Cristela Blue, CMA 02/21/2024, 11:34 AM

## 2024-02-21 NOTE — Progress Notes (Signed)
 PHARMACY - ANTICOAGULATION CONSULT NOTE  Pharmacy Consult for Heparin  Indication: chest pain/ACS  Allergies  Allergen Reactions   Oxycodone-Acetaminophen    Hydrocodone-Acetaminophen Other (See Comments)    Reaction: pt can't take anything with tylenol due to her liver disease.    Ibuprofen Other (See Comments)    Esophageal varices   Naproxen Other (See Comments)    Esophageal varices   Propoxyphene Other (See Comments)    Reaction: pt isn't sure, but knows she can't take it.    Shellfish Allergy Hives   Tamiflu  [Oseltamivir Phosphate] Other (See Comments)    Esophageal varices    Patient Measurements: Height: 5\' 4"  (162.6 cm) Weight: 84.6 kg (186 lb 8.2 oz) IBW/kg (Calculated) : 54.7 HEPARIN DW (KG): 74.8  Vital Signs: Temp: 98.4 F (36.9 C) (03/31 0338) Temp Source: Oral (03/30 1942) BP: 137/80 (03/31 0338) Pulse Rate: 57 (03/31 0338)  Labs: Recent Labs    02/18/24 1027 02/18/24 1105 02/19/24 0604 02/20/24 0520 02/21/24 0402  HGB  --    < > 9.5* 10.7* 9.5*  HCT  --   --  29.8* 33.0* 29.5*  PLT  --   --  144* 131* 109*  HEPARINUNFRC  --    < > 0.51 0.47 0.44  CREATININE 0.94  --  0.88  --   --   TROPONINIHS 3,476*  --   --   --   --    < > = values in this interval not displayed.    Estimated Creatinine Clearance: 65.3 mL/min (by C-G formula based on SCr of 0.88 mg/dL).   Medical History: Past Medical History:  Diagnosis Date   Alcoholic cirrhosis of liver (HCC)    Asthma    CHF (congestive heart failure) (HCC)    Chronic disease anemia    Esophageal varices (HCC)    GR I on EGD by Dr Shelle Iron 09/2014   ETOH abuse    Hypertension    Hypothyroidism    Murmur    Portal hypertension (HCC)    Sarcoidosis     Medications:  Medications Prior to Admission  Medication Sig Dispense Refill Last Dose/Taking   albuterol (VENTOLIN HFA) 108 (90 Base) MCG/ACT inhaler Inhale 1-2 puffs into the lungs every 4 (four) hours as needed for wheezing or shortness of  breath.   Taking As Needed   B Complex-C-Folic Acid (B COMPLEX-VITAMIN C-FOLIC ACID) 1 MG tablet Take 1 tablet by mouth daily with breakfast. (Patient not taking: Reported on 02/18/2024) 90 tablet 3 Not Taking   ferrous sulfate 325 (65 FE) MG tablet Take 1 tablet (325 mg total) by mouth daily with breakfast. (Patient not taking: Reported on 02/18/2024) 30 tablet 3 Not Taking   folic acid (FOLVITE) 1 MG tablet Take 1 tablet (1 mg total) by mouth daily. (Patient not taking: Reported on 02/18/2024) 30 tablet 2 Not Taking   hydrALAZINE (APRESOLINE) 25 MG tablet Take 1 tablet (25 mg total) by mouth every 8 (eight) hours. (Patient not taking: Reported on 02/18/2024) 90 tablet 2 Not Taking   levothyroxine (SYNTHROID) 125 MCG tablet Take 125 mcg by mouth daily. (Patient not taking: Reported on 02/18/2024)   Not Taking   Multiple Vitamin (MULTIVITAMIN) tablet Take 1 tablet by mouth every other day. (Patient not taking: Reported on 02/18/2024)   Not Taking   nadolol (CORGARD) 40 MG tablet Take 1 tablet (40 mg total) by mouth daily. (Patient not taking: Reported on 02/18/2024) 30 tablet 2 Not Taking   naltrexone (  DEPADE) 50 MG tablet Take 50 mg by mouth daily. (Patient not taking: Reported on 02/18/2024)   Not Taking   omeprazole (PRILOSEC) 40 MG capsule Take 40 mg by mouth 2 (two) times daily. (Patient not taking: Reported on 02/18/2024)   Not Taking   spironolactone (ALDACTONE) 50 MG tablet Take 50 mg by mouth daily. (Patient not taking: Reported on 02/18/2024)   Not Taking    Assessment: Pharmacy consulted to dose heparin in this 68 year old female admitted with ACS/NSTEMI. PMH includes CHF, chronic anemia, HTN, murmur, portal hypertension, sarcoidosis. cTn trending up. No prior anticoag noted.   Goal of Therapy:  Heparin level 0.3-0.7 units/ml Monitor platelets by anticoagulation protocol: Yes  Date Time aPTT/HL Rate/Comment 0328 1105 HL 0.55 Therapeutic x 1 0328 1811 HL 0.45 Therapeutic x 2 0329    0604   HL   0.51           Therapeutic X 3  0330    0520   HL  0.47           Therapeutic X 4  0331    0401   HL  0.44           Therapeutic X 5   Plan:  Continue heparin infusion at 900 units/hr Next heparin level check with am labs CBC daily while on heparin infusion  Leslyn Monda D Clinical Pharmacist 02/21/2024

## 2024-02-21 NOTE — Progress Notes (Signed)
 PROGRESS NOTE  Kathryn [name]    DOB: Jun 26, 1956, 68 y.o.  ZOX:[id number]    Code Status: Full Code   DOA: 02/17/2024   LOS: 3   Brief hospital course  Kathryn [name] is a 68yo female with PMH of Alcohol use disorder, cirrhosis with portal vein thrombosis on Eliquis, esophageal varices, HTN, hypothyroidism who presents to the ED by EMS with a complaint of chest pain.   ED course: Afebrile, heart rate in the 130s BP 97/58, O2 sat 100% on room air. Troponin 290> 800>>3,476, BNP 278 WBC 3700 and hemoglobin 10.7, Potassium 3.2, magnesium 1.8 Creatinine at baseline at 1.27 but with bicarb of 13 and anion gap 21 On arrival blood glucose was 36-> 42-> 182 following D50 TSH up at 9.81 and T4 down at 0.51 Hepatic function panel and lipase WNL EtOH 219 EKG: Afib RVR  Treated with Heparin infusion, Cardizem boluses. Cardiology consulted.   presently- chest pain free. cardiology consulted for Afib, NSTEMI. Remains stable on heparin gtt over the weekend and undergoing cath today.   Assessment & Plan  Principal Problem:   Atrial fibrillation with rapid ventricular response (HCC) Active Problems:   Hypoglycemia   High anion gap metabolic acidosis   NSTEMI (non-ST elevated myocardial infarction) (HCC)   Esophageal varices (HCC)   Portal vein thrombosis   Alcoholic cirrhosis of liver (HCC)   Alcohol use disorder   Hypertension   Hypothyroidism   CKD (chronic kidney disease), stage IIIa  Atrial fibrillation with RVR- HR in 60s. Echo shows EF 65-70%. severe asymmetric left ventricular hypertrophy of the basal-septal segment.  moderately elevated pulmonary artery systolic  pressure. The estimated right ventricular systolic pressure is 49.8 mmHg  Moderate mitral valve regurgitation.  - cardiology following -On nadolol for varices - continue diltizem - continue heparin gtt, will need to discuss long-term anticoagulation options given h/o esophageal varices. She has been on eliquis long term  previously without causing bleeding to her varices so can likely restart  post-procedure.    NSTEMI - Patient presents with chest pain, no acute EKG changes but troponin 290---> 3,476 - continue heparin - cath on Monday - management per cards   Hypokalemia- replace K+ and check Mg++  High anion gap metabolic acidosis- resolved with IV fluids   Hypoglycemia- asymptomatic and resolved with IVF with dextrose   Alcoholic cirrhosis of liver (HCC) Portal vein thrombosis Esophageal varices Continue nadolol and spironolactone Patient denies vomiting, blood in stool or melena  Thrombocytopenia- mild. No acute bleeding. In relation to liver disease.  - CBC am   Alcohol use disorder Alcohol intoxication EtOH level over 200 on presentation CIWA withdrawal protocol   Hypertension- titrate back on as tolerated given initial hypotension   CKD (chronic kidney disease), stage IIIa Renal function at baseline   Hypothyroidism Resume home levothyroxine 125 TSH up at 9.81 and T4 down at 0.51  Body mass index is 32.01 kg/m.  VTE ppx: heparin gtt  Diet:     Diet   Diet NPO time specified Except for: Sips with Meds   Consultants: Cardiology   Subjective 02/21/24    Pt reports no concerns today. Currently awaiting cath.    Objective   Vitals:   02/20/24 2012 02/20/24 2330 02/21/24 0338 02/21/24 0635  BP:  (!) 147/90 137/80 (!) 148/81  Pulse:  (!) 59 (!) 57 61  Resp:  20 20   Temp:  98.3 F (36.8 C) 98.4 F (36.9 C)   TempSrc:  SpO2:  100% 100% 97%  Weight: 84.6 kg     Height:        Intake/Output Summary (Last 24 hours) at 02/21/2024 0745 Last data filed at 02/20/2024 0900 Gross per 24 hour  Intake 0 ml  Output --  Net 0 ml   Filed Weights   02/17/24 2318 02/20/24 2012  Weight: 89.8 kg 84.6 kg    Physical Exam:  General: awake, alert, NAD HEENT: atraumatic, clear conjunctiva, anicteric sclera, MMM, hearing grossly normal Respiratory: normal respiratory  effort. CTAB Cardiovascular: quick capillary refill, normal S1/S2, RRR, no JVD, murmurs Nervous: A&O x3. no gross focal neurologic deficits, normal speech Extremities: moves all equally, no edema, normal tone Skin: dry, intact, normal temperature, normal color. No rashes, lesions or ulcers on exposed skin Psychiatry: normal mood, congruent affect  Labs   I have personally reviewed the following labs and imaging studies CBC    Component Value Date/Time   WBC 3.0 (L) 02/21/2024 0402   RBC 3.45 (L) 02/21/2024 0402   HGB 9.5 (L) 02/21/2024 0402   HGB 10.6 (L) 12/08/2023 1213   HGB 9.9 (L) 09/26/2014 1058   HCT 29.5 (L) 02/21/2024 0402   HCT 26.9 (L) 09/26/2014 0203   PLT 109 (L) 02/21/2024 0402   PLT 108 (L) 12/08/2023 1213   PLT 117 (L) 09/26/2014 0203   MCV 85.5 02/21/2024 0402   MCV 96 09/26/2014 0203   MCH 27.5 02/21/2024 0402   MCHC 32.2 02/21/2024 0402   RDW 16.4 (H) 02/21/2024 0402   RDW 24.4 (H) 09/26/2014 0203   LYMPHSABS 0.8 02/17/2024 2331   LYMPHSABS 0.5 (L) 12/18/2013 0445   MONOABS 0.3 02/17/2024 2331   MONOABS 0.4 12/18/2013 0445   EOSABS 0.2 02/17/2024 2331   EOSABS 0.1 12/18/2013 0445   BASOSABS 0.1 02/17/2024 2331   BASOSABS 1 09/26/2014 0203   BASOSABS 0.0 12/18/2013 0445      Latest Ref Rng & Units 02/19/2024    6:04 AM 02/18/2024   10:27 AM 02/17/2024   11:31 PM  BMP  Glucose 70 - 99 mg/dL 161  096  045   BUN 8 - 23 mg/dL 17  16  19    Creatinine 0.44 - 1.00 mg/dL 4.09  8.11  9.14   Sodium 135 - 145 mmol/L 137  134  138   Potassium 3.5 - 5.1 mmol/L 3.4  3.8  3.2   Chloride 98 - 111 mmol/L 105  102  104   CO2 22 - 32 mmol/L 23  17  13    Calcium 8.9 - 10.3 mg/dL 8.7  7.7  7.7     ECHOCARDIOGRAM COMPLETE Result Date: 02/20/2024    ECHOCARDIOGRAM REPORT   Patient Name:   Kathryn [name] Date of Exam: 02/20/2024 Medical Rec #:  [id number]    Height:       64.0 in Accession #:    [id number]   Weight:       198.0 lb Date of Birth:  [birth year]    BSA:           1.948 m Patient Age:    67 years     BP:           147/87 mmHg Patient Gender: F            HR:           63 bpm. Exam Location:  [facility] Procedure: 2D Echo, Strain Analysis, Color Doppler and Limited Color Doppler            (  Both Spectral and Color Flow Doppler were utilized during            procedure). Indications:     NSTEMI I21.4  History:         Patient has prior history of Echocardiogram examinations, most                  recent 01/04/2022.  Sonographer:     Overton Mam RDCS, FASE Referring Phys:  [id number] Andris Baumann Diagnosing Phys: Jodelle Red MD  Sonographer Comments: Global longitudinal strain was attempted. IMPRESSIONS  1. Left ventricular ejection fraction, by estimation, is 65 to 70%. The left ventricle has normal function. The left ventricle has no regional wall motion abnormalities. There is severe asymmetric left ventricular hypertrophy of the basal-septal segment. Left ventricular diastolic parameters are indeterminate.  2. Right ventricular systolic function is normal. The right ventricular size is normal. There is moderately elevated pulmonary artery systolic pressure. The estimated right ventricular systolic pressure is 49.8 mmHg.  3. Left atrial size was moderately dilated.  4. Right atrial size was mildly dilated.  5. The mitral valve is abnormal. Moderate mitral valve regurgitation. No evidence of mitral stenosis.  6. Tricuspid valve regurgitation is mild to moderate.  7. The aortic valve is tricuspid. There is mild calcification of the aortic valve. There is mild thickening of the aortic valve. Aortic valve regurgitation is not visualized. Aortic valve sclerosis is present, with no evidence of aortic valve stenosis.  8. The inferior vena cava is normal in size with greater than 50% respiratory variability, suggesting right atrial pressure of 3 mmHg. Comparison(s): Prior images reviewed side by side. Changes from prior study are noted. MR and TR more significant compared to  prior, elevated RVSP. FINDINGS  Left Ventricle: No significant LVOT gradient noted on current study. Left ventricular ejection fraction, by estimation, is 65 to 70%. The left ventricle has normal function. The left ventricle has no regional wall motion abnormalities. Global longitudinal strain performed but not reported based on interpreter judgement due to suboptimal tracking. The left ventricular internal cavity size was normal in size. There is severe asymmetric left ventricular hypertrophy of the basal-septal segment. Left ventricular diastolic parameters are indeterminate. Right Ventricle: The right ventricular size is normal. No increase in right ventricular wall thickness. Right ventricular systolic function is normal. There is moderately elevated pulmonary artery systolic pressure. The tricuspid regurgitant velocity is 3.42 m/s, and with an assumed right atrial pressure of 3 mmHg, the estimated right ventricular systolic pressure is 49.8 mmHg. Left Atrium: Left atrial size was moderately dilated. Right Atrium: Right atrial size was mildly dilated. Pericardium: There is no evidence of pericardial effusion. Mitral Valve: The mitral valve is abnormal. There is moderate thickening of the mitral valve leaflet(s). There is moderate calcification of the mitral valve leaflet(s). Moderate mitral valve regurgitation. No evidence of mitral valve stenosis. Tricuspid Valve: The tricuspid valve is normal in structure. Tricuspid valve regurgitation is mild to moderate. No evidence of tricuspid stenosis. Aortic Valve: The aortic valve is tricuspid. There is mild calcification of the aortic valve. There is mild thickening of the aortic valve. Aortic valve regurgitation is not visualized. Aortic valve sclerosis is present, with no evidence of aortic valve stenosis. Aortic valve peak gradient measures 6.8 mmHg. Pulmonic Valve: The pulmonic valve was not well visualized. Pulmonic valve regurgitation is trivial. No evidence of  pulmonic stenosis. Aorta: The aortic root, ascending aorta and aortic arch are all structurally normal, with no evidence of dilitation  or obstruction. Venous: The inferior vena cava is normal in size with greater than 50% respiratory variability, suggesting right atrial pressure of 3 mmHg. IAS/Shunts: The atrial septum is grossly normal.  LEFT VENTRICLE PLAX 2D LVIDd:         4.20 cm   Diastology LVIDs:         2.80 cm   LV e' medial:    9.79 cm/s LV PW:         1.40 cm   LV E/e' medial:  13.5 LV IVS:        1.30 cm   LV e' lateral:   13.10 cm/s LVOT diam:     2.00 cm   LV E/e' lateral: 10.1 LV SV:         60 LV SV Index:   31 LVOT Area:     3.14 cm  RIGHT VENTRICLE RV Basal diam:  2.50 cm RV S prime:     10.60 cm/s TAPSE (M-mode): 1.7 cm LEFT ATRIUM             Index        RIGHT ATRIUM           Index LA diam:        4.50 cm 2.31 cm/m   RA Area:     16.40 cm LA Vol (A2C):   83.8 ml 43.02 ml/m  RA Volume:   42.70 ml  21.92 ml/m LA Vol (A4C):   85.2 ml 43.74 ml/m LA Biplane Vol: 85.4 ml 43.84 ml/m  AORTIC VALVE                 PULMONIC VALVE AV Area (Vmax): 2.19 cm     PV Vmax:        0.77 m/s AV Vmax:        130.00 cm/s  PV Peak grad:   2.4 mmHg AV Peak Grad:   6.8 mmHg     RVOT Peak grad: 1 mmHg LVOT Vmax:      90.80 cm/s LVOT Vmean:     66.300 cm/s LVOT VTI:       0.190 m  AORTA Ao Root diam: 2.60 cm Ao Asc diam:  2.50 cm MITRAL VALVE                TRICUSPID VALVE MV Area (PHT): 3.06 cm     TR Peak grad:   46.8 mmHg MV Decel Time: 248 msec     TR Vmax:        342.00 cm/s MV E velocity: 132.00 cm/s MV A velocity: 49.30 cm/s   SHUNTS MV E/A ratio:  2.68         Systemic VTI:  0.19 m                             Systemic Diam: 2.00 cm Jodelle Red MD Electronically signed by Jodelle Red MD Signature Date/Time: 02/20/2024/2:58:44 PM    Final      Disposition Plan & Communication  Patient status: Inpatient  Admitted From: Home Planned disposition location: Home Anticipated discharge  date: 4/1 pending cardiac evaluation   Family Communication: none at bedside    Author: Leeroy Bock, DO [facility] 02/21/2024, 7:45 AM   Available by Epic secure chat 7AM-7PM. If 7PM-7AM, please contact night-coverage.  [facility] contact information found on [email or web address].

## 2024-02-21 NOTE — Interval H&P Note (Signed)
 History and Physical Interval Note:  02/21/2024 3:08 PM  Kathryn [name]  has presented today for surgery, with the diagnosis of Non-ST elevated myocardial infarction.  The various methods of treatment have been discussed with the patient and family. After consideration of risks, benefits and other options for treatment, the patient has consented to  Procedure(s): LEFT HEART CATH AND CORONARY ANGIOGRAPHY (N/A) as a surgical intervention.  The patient's history has been reviewed, patient examined, no change in status, stable for surgery.  I have reviewed the patient's chart and labs.  Questions were answered to the patient's satisfaction.    Cath Lab Visit (complete for each Cath Lab visit)  Clinical Evaluation Leading to the Procedure:   ACS: Yes.    Non-ACS:  N/A  Senita Corredor

## 2024-02-22 ENCOUNTER — Other Ambulatory Visit: Payer: Self-pay

## 2024-02-22 ENCOUNTER — Encounter: Payer: Self-pay | Admitting: Internal Medicine

## 2024-02-22 ENCOUNTER — Other Ambulatory Visit (HOSPITAL_COMMUNITY): Payer: Self-pay

## 2024-02-22 DIAGNOSIS — N179 Acute kidney failure, unspecified: Secondary | ICD-10-CM

## 2024-02-22 DIAGNOSIS — I214 Non-ST elevation (NSTEMI) myocardial infarction: Secondary | ICD-10-CM | POA: Diagnosis not present

## 2024-02-22 DIAGNOSIS — I2489 Other forms of acute ischemic heart disease: Secondary | ICD-10-CM | POA: Diagnosis not present

## 2024-02-22 DIAGNOSIS — F1090 Alcohol use, unspecified, uncomplicated: Secondary | ICD-10-CM | POA: Diagnosis not present

## 2024-02-22 DIAGNOSIS — I4891 Unspecified atrial fibrillation: Secondary | ICD-10-CM | POA: Diagnosis not present

## 2024-02-22 DIAGNOSIS — E162 Hypoglycemia, unspecified: Secondary | ICD-10-CM

## 2024-02-22 LAB — BASIC METABOLIC PANEL WITH GFR
Anion gap: 6 (ref 5–15)
BUN: 13 mg/dL (ref 8–23)
CO2: 24 mmol/L (ref 22–32)
Calcium: 8.6 mg/dL — ABNORMAL LOW (ref 8.9–10.3)
Chloride: 104 mmol/L (ref 98–111)
Creatinine, Ser: 1.04 mg/dL — ABNORMAL HIGH (ref 0.44–1.00)
GFR, Estimated: 59 mL/min — ABNORMAL LOW (ref >60)
Glucose, Bld: 89 mg/dL (ref 70–99)
Potassium: 3.7 mmol/L (ref 3.5–5.1)
Sodium: 134 mmol/L — ABNORMAL LOW (ref 135–145)

## 2024-02-22 LAB — CBC
HCT: 30 % — ABNORMAL LOW (ref 36.0–46.0)
Hemoglobin: 9.5 g/dL — ABNORMAL LOW (ref 12.0–15.0)
MCH: 27.9 pg (ref 26.0–34.0)
MCHC: 31.7 g/dL (ref 30.0–36.0)
MCV: 88.2 fL (ref 80.0–100.0)
Platelets: 103 10*3/uL — ABNORMAL LOW (ref 150–400)
RBC: 3.4 MIL/uL — ABNORMAL LOW (ref 3.87–5.11)
RDW: 17 % — ABNORMAL HIGH (ref 11.5–15.5)
WBC: 3.3 10*3/uL — ABNORMAL LOW (ref 4.0–10.5)
nRBC: 0.6 % — ABNORMAL HIGH (ref 0.0–0.2)

## 2024-02-22 LAB — HEPARIN LEVEL (UNFRACTIONATED): Heparin Unfractionated: 0.29 [IU]/mL — ABNORMAL LOW (ref 0.30–0.70)

## 2024-02-22 MED ORDER — LEVOTHYROXINE SODIUM 125 MCG PO TABS
125.0000 ug | ORAL_TABLET | Freq: Every day | ORAL | 0 refills | Status: DC
  Filled 2024-02-22 (×3): qty 30, 30d supply, fill #0

## 2024-02-22 MED ORDER — ATORVASTATIN CALCIUM 10 MG PO TABS: 10.0000 mg | ORAL_TABLET | Freq: Every day | ORAL | 0 refills | Status: DC

## 2024-02-22 MED ORDER — NADOLOL 80 MG PO TABS: 80.0000 mg | ORAL_TABLET | Freq: Every day | ORAL | 0 refills | Status: DC

## 2024-02-22 MED ORDER — LEVOTHYROXINE SODIUM 125 MCG PO TABS: 125.0000 ug | ORAL_TABLET | Freq: Every day | ORAL | 0 refills | Status: DC

## 2024-02-22 MED ORDER — NADOLOL 40 MG PO TABS
80.0000 mg | ORAL_TABLET | Freq: Every day | ORAL | Status: DC
  Administered 2024-02-22: 80 mg via ORAL
  Filled 2024-02-22: qty 2

## 2024-02-22 MED ORDER — APIXABAN 5 MG PO TABS
5.0000 mg | ORAL_TABLET | Freq: Two times a day (BID) | ORAL | 0 refills | Status: DC
Start: 2024-02-22 — End: 2024-02-22

## 2024-02-22 MED ORDER — ATORVASTATIN CALCIUM 10 MG PO TABS
10.0000 mg | ORAL_TABLET | Freq: Every day | ORAL | 0 refills | Status: DC
  Filled 2024-02-22: qty 30, 30d supply, fill #0

## 2024-02-22 MED ORDER — NADOLOL 40 MG PO TABS
80.0000 mg | ORAL_TABLET | Freq: Every day | ORAL | 0 refills | Status: DC
Start: 2024-02-23 — End: 2024-07-27
  Filled 2024-02-22: qty 51, 26d supply, fill #0
  Filled 2024-02-22 (×2): qty 30, 30d supply, fill #0
  Filled 2024-02-22: qty 9, 4d supply, fill #0
  Filled 2024-02-22: qty 60, 30d supply, fill #0

## 2024-02-22 MED ORDER — APIXABAN 5 MG PO TABS
5.0000 mg | ORAL_TABLET | Freq: Two times a day (BID) | ORAL | 0 refills | Status: DC
  Filled 2024-02-22: qty 60, 30d supply, fill #0

## 2024-02-22 MED ORDER — APIXABAN 5 MG PO TABS
5.0000 mg | ORAL_TABLET | Freq: Two times a day (BID) | ORAL | Status: DC
  Administered 2024-02-22: 5 mg via ORAL
  Filled 2024-02-22: qty 1

## 2024-02-22 MED ORDER — VITAMIN B-1 100 MG PO TABS
100.0000 mg | ORAL_TABLET | Freq: Every day | ORAL | 0 refills | Status: DC
  Filled 2024-02-22: qty 30, 30d supply, fill #0

## 2024-02-22 MED ORDER — VITAMIN B-1 100 MG PO TABS: 100.0000 mg | ORAL_TABLET | Freq: Every day | ORAL | 0 refills | Status: DC

## 2024-02-22 NOTE — Plan of Care (Signed)
  Problem: Education: Goal: Understanding of cardiac disease, CV risk reduction, and recovery process will improve Outcome: Completed/Met Goal: Individualized Educational Video(s) Outcome: Completed/Met   Problem: Activity: Goal: Ability to tolerate increased activity will improve Outcome: Completed/Met   Problem: Cardiac: Goal: Ability to achieve and maintain adequate cardiovascular perfusion will improve Outcome: Completed/Met   Problem: Health Behavior/Discharge Planning: Goal: Ability to safely manage health-related needs after discharge will improve Outcome: Completed/Met   Problem: Education: Goal: Knowledge of General Education information will improve Description: Including pain rating scale, medication(s)/side effects and non-pharmacologic comfort measures Outcome: Completed/Met   Problem: Health Behavior/Discharge Planning: Goal: Ability to manage health-related needs will improve Outcome: Completed/Met   Problem: Clinical Measurements: Goal: Ability to maintain clinical measurements within normal limits will improve Outcome: Completed/Met Goal: Will remain free from infection Outcome: Completed/Met Goal: Diagnostic test results will improve Outcome: Completed/Met Goal: Respiratory complications will improve Outcome: Completed/Met Goal: Cardiovascular complication will be avoided Outcome: Completed/Met   Problem: Activity: Goal: Risk for activity intolerance will decrease Outcome: Completed/Met   Problem: Nutrition: Goal: Adequate nutrition will be maintained Outcome: Completed/Met   Problem: Coping: Goal: Level of anxiety will decrease Outcome: Completed/Met   Problem: Elimination: Goal: Will not experience complications related to bowel motility Outcome: Completed/Met Goal: Will not experience complications related to urinary retention Outcome: Completed/Met   Problem: Pain Managment: Goal: General experience of comfort will improve and/or be  controlled Outcome: Completed/Met   Problem: Safety: Goal: Ability to remain free from injury will improve Outcome: Completed/Met   Problem: Skin Integrity: Goal: Risk for impaired skin integrity will decrease Outcome: Completed/Met   Problem: Education: Goal: Knowledge of disease or condition will improve Outcome: Completed/Met Goal: Understanding of medication regimen will improve Outcome: Completed/Met Goal: Individualized Educational Video(s) Outcome: Completed/Met   Problem: Activity: Goal: Ability to tolerate increased activity will improve Outcome: Completed/Met   Problem: Cardiac: Goal: Ability to achieve and maintain adequate cardiopulmonary perfusion will improve Outcome: Completed/Met   Problem: Health Behavior/Discharge Planning: Goal: Ability to safely manage health-related needs after discharge will improve Outcome: Completed/Met   Problem: Education: Goal: Understanding of CV disease, CV risk reduction, and recovery process will improve Outcome: Completed/Met Goal: Individualized Educational Video(s) Outcome: Completed/Met   Problem: Activity: Goal: Ability to return to baseline activity level will improve Outcome: Completed/Met   Problem: Cardiovascular: Goal: Ability to achieve and maintain adequate cardiovascular perfusion will improve Outcome: Completed/Met Goal: Vascular access site(s) Level 0-1 will be maintained Outcome: Completed/Met   Problem: Health Behavior/Discharge Planning: Goal: Ability to safely manage health-related needs after discharge will improve Outcome: Completed/Met

## 2024-02-22 NOTE — Progress Notes (Signed)
 Patient Name: Kathryn [name] Date of Encounter: 02/22/2024 [facility] Cardiologist: New - Kirke Corin  Interval Summary  .    LHC on 3/31 showed mild to moderate, nonobstructive CAD as outlined below.  No chest pain, dyspnea, palpitations, dizziness, presyncope, or syncope.  No symptoms concerning for bleeding.  Has been tolerating heparin drip while admitted.  Hemoglobin low though stable at 9.5 over the past 24 hours PLT mildly low though stable at 103.  Vital Signs .    Vitals:   02/21/24 2355 02/22/24 0413 02/22/24 0540 02/22/24 0849  BP: 131/83 139/87 (!) 156/90 131/73  Pulse: (!) 58 (!) 57 62 61  Resp: 18 18  16   Temp: 98.1 F (36.7 C) 97.6 F (36.4 C)  98.2 F (36.8 C)  TempSrc: Oral Oral    SpO2: 99% 100%  98%  Weight:      Height:        Intake/Output Summary (Last 24 hours) at 02/22/2024 1006 Last data filed at 02/21/2024 1730 Gross per 24 hour  Intake 120 ml  Output --  Net 120 ml      02/20/2024    8:12 PM 02/17/2024   11:18 PM 01/20/2024   10:23 AM  Last 3 Weights  Weight (lbs) 186 lb 8.2 oz 198 lb 187 lb 12.8 oz  Weight (kg) 84.6 kg 89.812 kg 85.186 kg      Telemetry/ECG    NSR and sinus bradycardia - Personally Reviewed  Physical Exam .   GEN: No acute distress.   Neck: No JVD Cardiac: RRR, no murmurs, rubs, or gallops.  Respiratory: Clear to auscultation bilaterally. GI: Soft, nontender, non-distended  MS: No edema  Assessment & Plan .     Demand ischemia: Patient presented with chest pain and was found to be in atrial fibrillation with rapid ventricular response.  She also had elevated troponin, still rising on last check 4 days ago at 3476.  Catheterization on 3/31 showed mild to moderate, nonobstructive CAD consistent with supply-demand mismatch. -Stop aspirin with initiation of apixaban 5 mg twice daily given A-fib. -Start low-dose atorvastatin for target LDL less than 70 with close monitoring of liver function in the office.  -Post cath  instructions.   Atrial fibrillation with rapid ventricular response: Patient has been maintaining sinus rhythm.   -HAS-BLED score of 4 with an estimated rate of significant bleeding of 8.9%.  She reports her bleeding is well-controlled when she is taking Protonix.   -She would like to move forward with a trial of apixaban 5 mg twice daily with close monitoring for bleeding and labs.   -CHA2DS2-VASc score of at least 4.  However, this is complicated by her history of cirrhosis with ongoing alcohol abuse, portal hypertension, and esophageal varices status post banding with report of prior GI bleed in the setting of anticoagulation. -Based on the prognosis of her liver disease and ability to quit drinking, she may be a left atrial appendage occlusion.  Diastolic dysfunction: LVEF normal by echo with mildly elevated LVEDP on catheterization today.  -Patient appears euvolemic on examination. -Maintain net even to slightly negative balance. -Consider resumption of spironolactone prior to discharge.  Alcoholic cirrhosis with esophageal varices: -Patient has continued to drink but wishes to quit.   -Long-term anticoagulation strategy complicated by portal hypertension and history of GI bleeding. -Encourage alcohol cessation. -Continue CIWA protocol. -Trial of anticoagulation as outlined above. -Recommend she follow-up with GI in the outpatient setting.  For questions or updates, please contact  [facility] Please consult [email or web address] for contact info under [facility].    Signed, Eula Listen, PA-C

## 2024-02-22 NOTE — TOC Benefit Eligibility Note (Signed)
 Pharmacy Patient Advocate Encounter  Insurance verification completed.    The patient is insured through  [employer] Part D . Patient has Medicare and is not eligible for a copay card, but may be able to apply for patient assistance or Medicare RX Payment Plan (Patient Must reach out to their plan, if eligible for payment plan), if available.    Ran test claim for Eliquis and the current 30 day co-pay is $0.   This test claim was processed through [facility]- copay amounts may vary at other pharmacies due to [employer], or as the patient moves through the different stages of their insurance plan.

## 2024-02-22 NOTE — Progress Notes (Incomplete)
 PROGRESS NOTE  Kathryn [name]    DOB: 05-13-1956, 68 y.o.  ZOX:[id number]    Code Status: Full Code   DOA: 02/17/2024   LOS: 4   Brief hospital course  Kathryn [name] is a 68yo female with PMH of Alcohol use disorder, cirrhosis with portal vein thrombosis on Eliquis, esophageal varices, HTN, hypothyroidism who presents to the ED by EMS with a complaint of chest pain.   ED course: Afebrile, heart rate in the 130s BP 97/58, O2 sat 100% on room air. Troponin 290> 800>>3,476, BNP 278 WBC 3700 and hemoglobin 10.7, Potassium 3.2, magnesium 1.8 Creatinine at baseline at 1.27 but with bicarb of 13 and anion gap 21 On arrival blood glucose was 36-> 42-> 182 following D50 TSH up at 9.81 and T4 down at 0.51 Hepatic function panel and lipase WNL EtOH 219 EKG: Afib RVR  Treated with Heparin infusion, Cardizem boluses. Cardiology consulted.   presently- chest pain free. cardiology consulted for Afib, NSTEMI. Remains stable on heparin gtt over the weekend and undergoing cath today.   Assessment & Plan  Principal Problem:   Atrial fibrillation with rapid ventricular response (HCC) Active Problems:   Hypoglycemia   High anion gap metabolic acidosis   NSTEMI (non-ST elevated myocardial infarction) (HCC)   Esophageal varices (HCC)   Portal vein thrombosis   Alcoholic cirrhosis of liver (HCC)   Alcohol use disorder   Hypertension   Hypothyroidism   CKD (chronic kidney disease), stage IIIa   Demand ischemia (HCC)  Atrial fibrillation with RVR- HR in 60s. Echo shows EF 65-70%. severe asymmetric left ventricular hypertrophy of the basal-septal segment.  moderately elevated pulmonary artery systolic  pressure. The estimated right ventricular systolic pressure is 49.8 mmHg  Moderate mitral valve regurgitation.  - cardiology following -On nadolol for varices - continue diltizem - continue heparin gtt, will need to discuss long-term anticoagulation options given h/o esophageal varices. She has been on  eliquis long term previously without causing bleeding to her varices so can likely restart  post-procedure.    NSTEMI - Patient presents with chest pain, no acute EKG changes but troponin 290---> 3,476 - continue heparin - cath on Monday - management per cards   Hypokalemia- replace K+ and check Mg++  High anion gap metabolic acidosis- resolved with IV fluids   Hypoglycemia- asymptomatic and resolved with IVF with dextrose   Alcoholic cirrhosis of liver (HCC) Portal vein thrombosis Esophageal varices Continue nadolol and spironolactone Patient denies vomiting, blood in stool or melena  Thrombocytopenia- mild. No acute bleeding. In relation to liver disease.  - CBC am   Alcohol use disorder Alcohol intoxication EtOH level over 200 on presentation CIWA withdrawal protocol   Hypertension- titrate back on as tolerated given initial hypotension   CKD (chronic kidney disease), stage IIIa Renal function at baseline   Hypothyroidism Resume home levothyroxine 125 TSH up at 9.81 and T4 down at 0.51  Body mass index is 32.01 kg/m.  VTE ppx: heparin gtt  Diet:     Diet   Diet Carb Modified Fluid consistency: Thin; Room service appropriate? Yes   Consultants: Cardiology   Subjective 02/22/24    Pt reports no concerns today. Currently awaiting cath.    Objective   Vitals:   02/21/24 2044 02/21/24 2355 02/22/24 0413 02/22/24 0540  BP: 138/84 131/83 139/87 (!) 156/90  Pulse: (!) 59 (!) 58 (!) 57 62  Resp: 20 18 18    Temp: 98.4 F (36.9 C) 98.1 F (36.7 C) 97.6  F (36.4 C)   TempSrc: Oral Oral Oral   SpO2: 99% 99% 100%   Weight:      Height:        Intake/Output Summary (Last 24 hours) at 02/22/2024 0750 Last data filed at 02/21/2024 1730 Gross per 24 hour  Intake 120 ml  Output --  Net 120 ml   Filed Weights   02/17/24 2318 02/20/24 2012  Weight: 89.8 kg 84.6 kg    Physical Exam:  General: awake, alert, NAD HEENT: atraumatic, clear conjunctiva,  anicteric sclera, MMM, hearing grossly normal Respiratory: normal respiratory effort. CTAB Cardiovascular: quick capillary refill, normal S1/S2, RRR, no JVD, murmurs Nervous: A&O x3. no gross focal neurologic deficits, normal speech Extremities: moves all equally, no edema, normal tone Skin: dry, intact, normal temperature, normal color. No rashes, lesions or ulcers on exposed skin Psychiatry: normal mood, congruent affect  Labs   I have personally reviewed the following labs and imaging studies CBC    Component Value Date/Time   WBC 3.3 (L) 02/22/2024 0301   RBC 3.40 (L) 02/22/2024 0301   HGB 9.5 (L) 02/22/2024 0301   HGB 10.6 (L) 12/08/2023 1213   HGB 9.9 (L) 09/26/2014 1058   HCT 30.0 (L) 02/22/2024 0301   HCT 26.9 (L) 09/26/2014 0203   PLT 103 (L) 02/22/2024 0301   PLT 108 (L) 12/08/2023 1213   PLT 117 (L) 09/26/2014 0203   MCV 88.2 02/22/2024 0301   MCV 96 09/26/2014 0203   MCH 27.9 02/22/2024 0301   MCHC 31.7 02/22/2024 0301   RDW 17.0 (H) 02/22/2024 0301   RDW 24.4 (H) 09/26/2014 0203   LYMPHSABS 0.8 02/17/2024 2331   LYMPHSABS 0.5 (L) 12/18/2013 0445   MONOABS 0.3 02/17/2024 2331   MONOABS 0.4 12/18/2013 0445   EOSABS 0.2 02/17/2024 2331   EOSABS 0.1 12/18/2013 0445   BASOSABS 0.1 02/17/2024 2331   BASOSABS 1 09/26/2014 0203   BASOSABS 0.0 12/18/2013 0445      Latest Ref Rng & Units 02/22/2024    3:01 AM 02/19/2024    6:04 AM 02/18/2024   10:27 AM  BMP  Glucose 70 - 99 mg/dL 89  409  811   BUN 8 - 23 mg/dL 13  17  16    Creatinine 0.44 - 1.00 mg/dL 9.14  7.82  9.56   Sodium 135 - 145 mmol/L 134  137  134   Potassium 3.5 - 5.1 mmol/L 3.7  3.4  3.8   Chloride 98 - 111 mmol/L 104  105  102   CO2 22 - 32 mmol/L 24  23  17    Calcium 8.9 - 10.3 mg/dL 8.6  8.7  7.7     CARDIAC CATHETERIZATION Result Date: 02/21/2024 Conclusions: Mild to moderate, nonobstructive coronary artery disease including 20-30% mid LAD and ostial LCx stenoses as well as 60-70% apical LAD  lesion.  Findings suggestive of supply-demand mismatch. Mildly elevated left ventricular filling pressure (LVEDP 21 mmHg). Recommendations: Escalate medical therapy and risk factor modification to prevent progression of CAD.  Will add low-dose statin therapy in the setting of chronic liver disease to target LDL less than 70. Maintain net even to slightly negative fluid balance in the setting of mildly elevated LVEDP suggestive of diastolic dysfunction. Resume heparin infusion 2 hours after TR band removal.  Consider transitioning to DOAC as soon as tomorrow if no evidence of bleeding or vascular injury. Yvonne Kendall, MD [facility]  ECHOCARDIOGRAM COMPLETE Result Date: 02/20/2024    ECHOCARDIOGRAM REPORT  Patient Name:   Kathryn [name] Date of Exam: 02/20/2024 Medical Rec #:  [id number]    Height:       64.0 in Accession #:    [id number]   Weight:       198.0 lb Date of Birth:  10-24-1956    BSA:          1.948 m Patient Age:    62 years     BP:           147/87 mmHg Patient Gender: F            HR:           63 bpm. Exam Location:  [facility] Procedure: 2D Echo, Strain Analysis, Color Doppler and Limited Color Doppler            (Both Spectral and Color Flow Doppler were utilized during            procedure). Indications:     NSTEMI I21.4  History:         Patient has prior history of Echocardiogram examinations, most                  recent 01/04/2022.  Sonographer:     Overton Mam RDCS, FASE Referring Phys:  [id number] Andris Baumann Diagnosing Phys: Jodelle Red MD  Sonographer Comments: Global longitudinal strain was attempted. IMPRESSIONS  1. Left ventricular ejection fraction, by estimation, is 65 to 70%. The left ventricle has normal function. The left ventricle has no regional wall motion abnormalities. There is severe asymmetric left ventricular hypertrophy of the basal-septal segment. Left ventricular diastolic parameters are indeterminate.  2. Right ventricular systolic function is normal. The  right ventricular size is normal. There is moderately elevated pulmonary artery systolic pressure. The estimated right ventricular systolic pressure is 49.8 mmHg.  3. Left atrial size was moderately dilated.  4. Right atrial size was mildly dilated.  5. The mitral valve is abnormal. Moderate mitral valve regurgitation. No evidence of mitral stenosis.  6. Tricuspid valve regurgitation is mild to moderate.  7. The aortic valve is tricuspid. There is mild calcification of the aortic valve. There is mild thickening of the aortic valve. Aortic valve regurgitation is not visualized. Aortic valve sclerosis is present, with no evidence of aortic valve stenosis.  8. The inferior vena cava is normal in size with greater than 50% respiratory variability, suggesting right atrial pressure of 3 mmHg. Comparison(s): Prior images reviewed side by side. Changes from prior study are noted. MR and TR more significant compared to prior, elevated RVSP. FINDINGS  Left Ventricle: No significant LVOT gradient noted on current study. Left ventricular ejection fraction, by estimation, is 65 to 70%. The left ventricle has normal function. The left ventricle has no regional wall motion abnormalities. Global longitudinal strain performed but not reported based on interpreter judgement due to suboptimal tracking. The left ventricular internal cavity size was normal in size. There is severe asymmetric left ventricular hypertrophy of the basal-septal segment. Left ventricular diastolic parameters are indeterminate. Right Ventricle: The right ventricular size is normal. No increase in right ventricular wall thickness. Right ventricular systolic function is normal. There is moderately elevated pulmonary artery systolic pressure. The tricuspid regurgitant velocity is 3.42 m/s, and with an assumed right atrial pressure of 3 mmHg, the estimated right ventricular systolic pressure is 49.8 mmHg. Left Atrium: Left atrial size was moderately dilated. Right  Atrium: Right atrial size was mildly dilated. Pericardium: There is no evidence of pericardial  effusion. Mitral Valve: The mitral valve is abnormal. There is moderate thickening of the mitral valve leaflet(s). There is moderate calcification of the mitral valve leaflet(s). Moderate mitral valve regurgitation. No evidence of mitral valve stenosis. Tricuspid Valve: The tricuspid valve is normal in structure. Tricuspid valve regurgitation is mild to moderate. No evidence of tricuspid stenosis. Aortic Valve: The aortic valve is tricuspid. There is mild calcification of the aortic valve. There is mild thickening of the aortic valve. Aortic valve regurgitation is not visualized. Aortic valve sclerosis is present, with no evidence of aortic valve stenosis. Aortic valve peak gradient measures 6.8 mmHg. Pulmonic Valve: The pulmonic valve was not well visualized. Pulmonic valve regurgitation is trivial. No evidence of pulmonic stenosis. Aorta: The aortic root, ascending aorta and aortic arch are all structurally normal, with no evidence of dilitation or obstruction. Venous: The inferior vena cava is normal in size with greater than 50% respiratory variability, suggesting right atrial pressure of 3 mmHg. IAS/Shunts: The atrial septum is grossly normal.  LEFT VENTRICLE PLAX 2D LVIDd:         4.20 cm   Diastology LVIDs:         2.80 cm   LV e' medial:    9.79 cm/s LV PW:         1.40 cm   LV E/e' medial:  13.5 LV IVS:        1.30 cm   LV e' lateral:   13.10 cm/s LVOT diam:     2.00 cm   LV E/e' lateral: 10.1 LV SV:         60 LV SV Index:   31 LVOT Area:     3.14 cm  RIGHT VENTRICLE RV Basal diam:  2.50 cm RV S prime:     10.60 cm/s TAPSE (M-mode): 1.7 cm LEFT ATRIUM             Index        RIGHT ATRIUM           Index LA diam:        4.50 cm 2.31 cm/m   RA Area:     16.40 cm LA Vol (A2C):   83.8 ml 43.02 ml/m  RA Volume:   42.70 ml  21.92 ml/m LA Vol (A4C):   85.2 ml 43.74 ml/m LA Biplane Vol: 85.4 ml 43.84 ml/m  AORTIC  VALVE                 PULMONIC VALVE AV Area (Vmax): 2.19 cm     PV Vmax:        0.77 m/s AV Vmax:        130.00 cm/s  PV Peak grad:   2.4 mmHg AV Peak Grad:   6.8 mmHg     RVOT Peak grad: 1 mmHg LVOT Vmax:      90.80 cm/s LVOT Vmean:     66.300 cm/s LVOT VTI:       0.190 m  AORTA Ao Root diam: 2.60 cm Ao Asc diam:  2.50 cm MITRAL VALVE                TRICUSPID VALVE MV Area (PHT): 3.06 cm     TR Peak grad:   46.8 mmHg MV Decel Time: 248 msec     TR Vmax:        342.00 cm/s MV E velocity: 132.00 cm/s MV A velocity: 49.30 cm/s   SHUNTS MV E/A ratio:  2.68  Systemic VTI:  0.19 m                             Systemic Diam: 2.00 cm Jodelle Red MD Electronically signed by Jodelle Red MD Signature Date/Time: 02/20/2024/2:58:44 PM    Final      Disposition Plan & Communication  Patient status: Inpatient  Admitted From: Home Planned disposition location: Home Anticipated discharge date: 4/1 pending cardiac evaluation   Family Communication: none at bedside    Author: Leeroy Bock, DO [facility] 02/22/2024, 7:50 AM   Available by Epic secure chat 7AM-7PM. If 7PM-7AM, please contact night-coverage.  [facility] contact information found on [email or web address].

## 2024-02-22 NOTE — Plan of Care (Signed)
  Problem: Education: Goal: Understanding of cardiac disease, CV risk reduction, and recovery process will improve Outcome: Progressing   Problem: Activity: Goal: Ability to tolerate increased activity will improve Outcome: Progressing   Problem: Cardiac: Goal: Ability to achieve and maintain adequate cardiovascular perfusion will improve Outcome: Progressing   Problem: Health Behavior/Discharge Planning: Goal: Ability to safely manage health-related needs after discharge will improve Outcome: Progressing   Problem: Education: Goal: Knowledge of General Education information will improve Description: Including pain rating scale, medication(s)/side effects and non-pharmacologic comfort measures Outcome: Progressing

## 2024-02-22 NOTE — Progress Notes (Signed)
 PHARMACY - ANTICOAGULATION CONSULT NOTE  Pharmacy Consult for Heparin  Indication: chest pain/ACS  Allergies  Allergen Reactions   Oxycodone-Acetaminophen    Hydrocodone-Acetaminophen Other (See Comments)    Reaction: pt can't take anything with tylenol due to her liver disease.    Ibuprofen Other (See Comments)    Esophageal varices   Naproxen Other (See Comments)    Esophageal varices   Propoxyphene Other (See Comments)    Reaction: pt isn't sure, but knows she can't take it.    Shellfish Allergy Hives   Tamiflu  [Oseltamivir Phosphate] Other (See Comments)    Esophageal varices    Patient Measurements: Height: 5\' 4"  (162.6 cm) Weight: 84.6 kg (186 lb 8.2 oz) IBW/kg (Calculated) : 54.7 HEPARIN DW (KG): 74.8  Vital Signs: Temp: 98.1 F (36.7 C) (03/31 2355) Temp Source: Oral (03/31 2355) BP: 131/83 (03/31 2355) Pulse Rate: 58 (03/31 2355)  Labs: Recent Labs    02/19/24 0604 02/20/24 0520 02/21/24 0402 02/22/24 0301  HGB 9.5* 10.7* 9.5* 9.5*  HCT 29.8* 33.0* 29.5* 30.0*  PLT 144* 131* 109* 103*  HEPARINUNFRC 0.51 0.47 0.44 0.29*  CREATININE 0.88  --   --  1.04*    Estimated Creatinine Clearance: 55.3 mL/min (A) (by C-G formula based on SCr of 1.04 mg/dL (H)).   Medical History: Past Medical History:  Diagnosis Date   Alcoholic cirrhosis of liver (HCC)    Asthma    CHF (congestive heart failure) (HCC)    Chronic disease anemia    Esophageal varices (HCC)    GR I on EGD by Dr Shelle Iron 09/2014   ETOH abuse    Hypertension    Hypothyroidism    Murmur    Portal hypertension (HCC)    Sarcoidosis     Medications:  Medications Prior to Admission  Medication Sig Dispense Refill Last Dose/Taking   albuterol (VENTOLIN HFA) 108 (90 Base) MCG/ACT inhaler Inhale 1-2 puffs into the lungs every 4 (four) hours as needed for wheezing or shortness of breath.   Taking As Needed   B Complex-C-Folic Acid (B COMPLEX-VITAMIN C-FOLIC ACID) 1 MG tablet Take 1 tablet by mouth  daily with breakfast. (Patient not taking: Reported on 02/18/2024) 90 tablet 3 Not Taking   ferrous sulfate 325 (65 FE) MG tablet Take 1 tablet (325 mg total) by mouth daily with breakfast. (Patient not taking: Reported on 02/18/2024) 30 tablet 3 Not Taking   folic acid (FOLVITE) 1 MG tablet Take 1 tablet (1 mg total) by mouth daily. (Patient not taking: Reported on 02/18/2024) 30 tablet 2 Not Taking   hydrALAZINE (APRESOLINE) 25 MG tablet Take 1 tablet (25 mg total) by mouth every 8 (eight) hours. (Patient not taking: Reported on 02/18/2024) 90 tablet 2 Not Taking   levothyroxine (SYNTHROID) 125 MCG tablet Take 125 mcg by mouth daily. (Patient not taking: Reported on 02/18/2024)   Not Taking   Multiple Vitamin (MULTIVITAMIN) tablet Take 1 tablet by mouth every other day. (Patient not taking: Reported on 02/18/2024)   Not Taking   nadolol (CORGARD) 40 MG tablet Take 1 tablet (40 mg total) by mouth daily. (Patient not taking: Reported on 02/18/2024) 30 tablet 2 Not Taking   naltrexone (DEPADE) 50 MG tablet Take 50 mg by mouth daily. (Patient not taking: Reported on 02/18/2024)   Not Taking   omeprazole (PRILOSEC) 40 MG capsule Take 40 mg by mouth 2 (two) times daily. (Patient not taking: Reported on 02/18/2024)   Not Taking   spironolactone (ALDACTONE) 50  MG tablet Take 50 mg by mouth daily. (Patient not taking: Reported on 02/18/2024)   Not Taking    Assessment: Pharmacy consulted to dose heparin in this 68 year old female admitted with ACS/NSTEMI. PMH includes CHF, chronic anemia, HTN, murmur, portal hypertension, sarcoidosis. cTn trending up. No prior anticoag noted.   Goal of Therapy:  Heparin level 0.3-0.7 units/ml Monitor platelets by anticoagulation protocol: Yes  Date Time aPTT/HL Rate/Comment 0328 1105 HL 0.55 Therapeutic x 1 0328 1811 HL 0.45 Therapeutic x 2 0329    0604   HL  0.51           Therapeutic X 3  0330    0520   HL  0.47           Therapeutic X 4  0331    0401   HL  0.44            Therapeutic X 5 0401 0301 HL 0.29 Subtherapeutic    Plan:  Increase heparin infusion to 1050 units/hr Recheck HL in 6 hrs after rate change CBC daily while on heparin infusion, Watching Plt: 103, trending down slowly  Kathryn [name], PharmD, [facility] 02/22/2024 4:14 AM

## 2024-02-22 NOTE — Discharge Summary (Signed)
 Physician Discharge Summary  Patient: Kathryn [name] ZOX:[id number] DOB: 1956/09/08   Code Status: Full Code Admit date: 02/17/2024 Discharge date: 02/22/2024 Disposition: Home, No home health services recommended PCP: Leanord Asal, Nelva Bush, MD  Recommendations for Outpatient Follow-up:  Follow up with PCP within 1-2 weeks Regarding general hospital follow up and preventative care Recommend BMP, CBC Follow-up with cardiology  Discharge Diagnoses:  Principal Problem:   Atrial fibrillation with rapid ventricular response (HCC) Active Problems:   Hypoglycemia   High anion gap metabolic acidosis   NSTEMI (non-ST elevated myocardial infarction) (HCC)   Esophageal varices (HCC)   Portal vein thrombosis   Alcoholic cirrhosis of liver (HCC)   Alcohol use disorder   Hypertension   Hypothyroidism   CKD (chronic kidney disease), stage IIIa   Demand ischemia (HCC)   AKI (acute kidney injury) (HCC)   Alcohol use  Brief Hospital Course Summary: Kathryn [name] is a 68yo female with PMH of Alcohol use disorder, cirrhosis with portal vein thrombosis on Eliquis, esophageal varices, HTN, hypothyroidism who presents to the ED by EMS with a complaint of chest pain.   ED course: Afebrile, heart rate in the 130s BP 97/58, O2 sat 100% on room air. Troponin 290> 800>>3,476, BNP 278 WBC 3700 and hemoglobin 10.7, Potassium 3.2, magnesium 1.8 Creatinine at baseline at 1.27 but with bicarb of 13 and anion gap 21 On arrival blood glucose was 36-> 42-> 182 following D50 TSH up at 9.81 and T4 down at 0.51 Hepatic function panel and lipase WNL EtOH 219 EKG: Afib RVR   Initially treated with Heparin infusion, Cardizem boluses. Cardiology consulted.    presently- chest pain free. cardiology consulted for Afib, NSTEMI. Remains stable on heparin gtt over the weekend and undergoing cath Monday.  A-fib-heart rate is well-controlled on current therapy.  Patient endorses that she has longstanding tolerance of  Eliquis without any disturbance of variceal bleeding.  She was transitioned from heparin drip back to Eliquis prior to discharge without any concerns.  Heart catheter revealed nonobstructive CAD.  Troponin elevation was thought to be related to supply demand mismatch in the setting of A-fib RVR.  Her medications were modified as listed below by cardiology.  She will follow-up with them outpatient.  Of note, although patient's original EtOH level was 219, she did not apparently experience any withdrawal symptoms while inpatient.  She was counseled on alcohol cessation and provided with outpatient resources for sobriety.  All other chronic conditions were treated with home medications.    Discharge Condition: Good, improved Recommended discharge diet: Cardiac diet  Consultations: Cardiology  Procedures/Studies: Left heart cath  Discharge Instructions     AMB referral to Phase II Cardiac Rehabilitation   Complete by: As directed    Diagnosis: Type II MI   After initial evaluation and assessments completed: Virtual Based Care may be provided alone or in conjunction with Phase 2 Cardiac Rehab based on patient barriers.: Yes   Intensive Cardiac Rehabilitation (ICR) [facility] location only OR Traditional Cardiac Rehabilitation (TCR) *If criteria for ICR are not met will enroll in TCR [facility] only): Yes   Amb referral to AFIB Clinic   Complete by: As directed       Allergies as of 02/22/2024       Reactions   Oxycodone-acetaminophen    Hydrocodone-acetaminophen Other (See Comments)   Reaction: pt can't take anything with tylenol due to her liver disease.    Ibuprofen Other (See Comments)   Esophageal varices  Naproxen Other (See Comments)   Esophageal varices   Propoxyphene Other (See Comments)   Reaction: pt isn't sure, but knows she can't take it.    Shellfish Allergy Hives   Tamiflu  [oseltamivir Phosphate] Other (See Comments)   Esophageal varices        Medication List     STOP  taking these medications    B complex-vitamin C-folic acid 1 MG tablet   ferrous sulfate 325 (65 FE) MG tablet   hydrALAZINE 25 MG tablet Commonly known as: APRESOLINE   naltrexone 50 MG tablet Commonly known as: DEPADE   omeprazole 40 MG capsule Commonly known as: PRILOSEC   spironolactone 50 MG tablet Commonly known as: ALDACTONE       TAKE these medications    albuterol 108 (90 Base) MCG/ACT inhaler Commonly known as: VENTOLIN HFA Inhale 1-2 puffs into the lungs every 4 (four) hours as needed for wheezing or shortness of breath.   apixaban 5 MG Tabs tablet Commonly known as: ELIQUIS Take 1 tablet (5 mg total) by mouth 2 (two) times daily.   atorvastatin 10 MG tablet Commonly known as: LIPITOR Take 1 tablet (10 mg total) by mouth daily. Start taking on: February 23, 2024   folic acid 1 MG tablet Commonly known as: FOLVITE Take 1 tablet (1 mg total) by mouth daily.   levothyroxine 125 MCG tablet Commonly known as: SYNTHROID Take 1 tablet (125 mcg total) by mouth daily at 6 (six) AM. Start taking on: February 23, 2024 What changed: when to take this   multivitamin tablet Take 1 tablet by mouth every other day.   nadolol 80 MG tablet Commonly known as: CORGARD Take 1 tablet (80 mg total) by mouth daily. Start taking on: February 23, 2024 What changed:  medication strength how much to take   thiamine 100 MG tablet Commonly known as: Vitamin B-1 Take 1 tablet (100 mg total) by mouth daily. Start taking on: February 23, 2024        Follow-up Information     Leanord Asal, Nelva Bush, MD Follow up in 1 week(s).   Specialty: Family Medicine Why: Hospital Follow-Up appointment: Have patient call and book appointment. Contact information: [address] [place] [address] [phone]                Subjective   Pt reports feeling well today.  She states that she tolerated her lunch very well.  Denies chest pain or shortness of breath.  Has no bleeding or  pain at the arterial site of the cath from yesterday.  All questions and concerns were addressed at time of discharge.  Objective  Blood pressure 131/73, pulse 61, temperature 98.2 F (36.8 C), resp. rate 16, height 5\' 4"  (1.626 m), weight 84.6 kg, SpO2 98%.   General: Pt is alert, awake, not in acute distress Cardiovascular: RRR, S1/S2 +, no rubs, no gallops Respiratory: CTA bilaterally, no wheezing, no rhonchi Abdominal: Soft, NT, ND, bowel sounds + Extremities: no edema, no cyanosis  The results of significant diagnostics from this hospitalization (including imaging, microbiology, ancillary and laboratory) are listed below for reference.   Imaging studies: CARDIAC CATHETERIZATION Result Date: 02/21/2024 Conclusions: Mild to moderate, nonobstructive coronary artery disease including 20-30% mid LAD and ostial LCx stenoses as well as 60-70% apical LAD lesion.  Findings suggestive of supply-demand mismatch. Mildly elevated left ventricular filling pressure (LVEDP 21 mmHg). Recommendations: Escalate medical therapy and risk factor modification to prevent progression of CAD.  Will add  low-dose statin therapy in the setting of chronic liver disease to target LDL less than 70. Maintain net even to slightly negative fluid balance in the setting of mildly elevated LVEDP suggestive of diastolic dysfunction. Resume heparin infusion 2 hours after TR band removal.  Consider transitioning to DOAC as soon as tomorrow if no evidence of bleeding or vascular injury. Yvonne Kendall, MD [facility]  ECHOCARDIOGRAM COMPLETE Result Date: 02/20/2024    ECHOCARDIOGRAM REPORT   Patient Name:   ALONIE GAZZOLA Friedmann Date of Exam: 02/20/2024 Medical Rec #:  [id number]    Height:       64.0 in Accession #:    [id number]   Weight:       198.0 lb Date of Birth:  12/03/1955    BSA:          1.948 m Patient Age:    37 years     BP:           147/87 mmHg Patient Gender: F            HR:           63 bpm. Exam Location:  [facility]  Procedure: 2D Echo, Strain Analysis, Color Doppler and Limited Color Doppler            (Both Spectral and Color Flow Doppler were utilized during            procedure). Indications:     NSTEMI I21.4  History:         Patient has prior history of Echocardiogram examinations, most                  recent 01/04/2022.  Sonographer:     Overton Mam RDCS, FASE Referring Phys:  [id number] Andris Baumann Diagnosing Phys: Jodelle Red MD  Sonographer Comments: Global longitudinal strain was attempted. IMPRESSIONS  1. Left ventricular ejection fraction, by estimation, is 65 to 70%. The left ventricle has normal function. The left ventricle has no regional wall motion abnormalities. There is severe asymmetric left ventricular hypertrophy of the basal-septal segment. Left ventricular diastolic parameters are indeterminate.  2. Right ventricular systolic function is normal. The right ventricular size is normal. There is moderately elevated pulmonary artery systolic pressure. The estimated right ventricular systolic pressure is 49.8 mmHg.  3. Left atrial size was moderately dilated.  4. Right atrial size was mildly dilated.  5. The mitral valve is abnormal. Moderate mitral valve regurgitation. No evidence of mitral stenosis.  6. Tricuspid valve regurgitation is mild to moderate.  7. The aortic valve is tricuspid. There is mild calcification of the aortic valve. There is mild thickening of the aortic valve. Aortic valve regurgitation is not visualized. Aortic valve sclerosis is present, with no evidence of aortic valve stenosis.  8. The inferior vena cava is normal in size with greater than 50% respiratory variability, suggesting right atrial pressure of 3 mmHg. Comparison(s): Prior images reviewed side by side. Changes from prior study are noted. MR and TR more significant compared to prior, elevated RVSP. FINDINGS  Left Ventricle: No significant LVOT gradient noted on current study. Left ventricular ejection fraction,  by estimation, is 65 to 70%. The left ventricle has normal function. The left ventricle has no regional wall motion abnormalities. Global longitudinal strain performed but not reported based on interpreter judgement due to suboptimal tracking. The left ventricular internal cavity size was normal in size. There is severe asymmetric left ventricular hypertrophy of the basal-septal segment. Left ventricular diastolic parameters are  indeterminate. Right Ventricle: The right ventricular size is normal. No increase in right ventricular wall thickness. Right ventricular systolic function is normal. There is moderately elevated pulmonary artery systolic pressure. The tricuspid regurgitant velocity is 3.42 m/s, and with an assumed right atrial pressure of 3 mmHg, the estimated right ventricular systolic pressure is 49.8 mmHg. Left Atrium: Left atrial size was moderately dilated. Right Atrium: Right atrial size was mildly dilated. Pericardium: There is no evidence of pericardial effusion. Mitral Valve: The mitral valve is abnormal. There is moderate thickening of the mitral valve leaflet(s). There is moderate calcification of the mitral valve leaflet(s). Moderate mitral valve regurgitation. No evidence of mitral valve stenosis. Tricuspid Valve: The tricuspid valve is normal in structure. Tricuspid valve regurgitation is mild to moderate. No evidence of tricuspid stenosis. Aortic Valve: The aortic valve is tricuspid. There is mild calcification of the aortic valve. There is mild thickening of the aortic valve. Aortic valve regurgitation is not visualized. Aortic valve sclerosis is present, with no evidence of aortic valve stenosis. Aortic valve peak gradient measures 6.8 mmHg. Pulmonic Valve: The pulmonic valve was not well visualized. Pulmonic valve regurgitation is trivial. No evidence of pulmonic stenosis. Aorta: The aortic root, ascending aorta and aortic arch are all structurally normal, with no evidence of dilitation or  obstruction. Venous: The inferior vena cava is normal in size with greater than 50% respiratory variability, suggesting right atrial pressure of 3 mmHg. IAS/Shunts: The atrial septum is grossly normal.  LEFT VENTRICLE PLAX 2D LVIDd:         4.20 cm   Diastology LVIDs:         2.80 cm   LV e' medial:    9.79 cm/s LV PW:         1.40 cm   LV E/e' medial:  13.5 LV IVS:        1.30 cm   LV e' lateral:   13.10 cm/s LVOT diam:     2.00 cm   LV E/e' lateral: 10.1 LV SV:         60 LV SV Index:   31 LVOT Area:     3.14 cm  RIGHT VENTRICLE RV Basal diam:  2.50 cm RV S prime:     10.60 cm/s TAPSE (M-mode): 1.7 cm LEFT ATRIUM             Index        RIGHT ATRIUM           Index LA diam:        4.50 cm 2.31 cm/m   RA Area:     16.40 cm LA Vol (A2C):   83.8 ml 43.02 ml/m  RA Volume:   42.70 ml  21.92 ml/m LA Vol (A4C):   85.2 ml 43.74 ml/m LA Biplane Vol: 85.4 ml 43.84 ml/m  AORTIC VALVE                 PULMONIC VALVE AV Area (Vmax): 2.19 cm     PV Vmax:        0.77 m/s AV Vmax:        130.00 cm/s  PV Peak grad:   2.4 mmHg AV Peak Grad:   6.8 mmHg     RVOT Peak grad: 1 mmHg LVOT Vmax:      90.80 cm/s LVOT Vmean:     66.300 cm/s LVOT VTI:       0.190 m  AORTA Ao Root diam: 2.60 cm Ao Asc diam:  2.50 cm MITRAL VALVE                TRICUSPID VALVE MV Area (PHT): 3.06 cm     TR Peak grad:   46.8 mmHg MV Decel Time: 248 msec     TR Vmax:        342.00 cm/s MV E velocity: 132.00 cm/s MV A velocity: 49.30 cm/s   SHUNTS MV E/A ratio:  2.68         Systemic VTI:  0.19 m                             Systemic Diam: 2.00 cm Jodelle Red MD Electronically signed by Jodelle Red MD Signature Date/Time: 02/20/2024/2:58:44 PM    Final    CT Angio Chest PE W/Cm &/Or Wo Cm Result Date: 02/18/2024 CLINICAL DATA:  Pulmonary embolus suspected with high probability. Chest pain beginning around 10 o'clock extending to the back. New atrial fibrillation. EXAM: CT ANGIOGRAPHY CHEST WITH CONTRAST TECHNIQUE: Multidetector CT  imaging of the chest was performed using the standard protocol during bolus administration of intravenous contrast. Multiplanar CT image reconstructions and MIPs were obtained to evaluate the vascular anatomy. RADIATION DOSE REDUCTION: This exam was performed according to the departmental dose-optimization program which includes automated exposure control, adjustment of the mA and/or kV according to patient size and/or use of iterative reconstruction technique. CONTRAST:  75mL OMNIPAQUE IOHEXOL 350 MG/ML SOLN COMPARISON:  Chest radiograph 02/17/2024. CTA chest abdomen and pelvis 12/02/2022 FINDINGS: Cardiovascular: Technically adequate study with good opacification of the central and segmental pulmonary arteries. Mild motion artifact. No focal filling defects are demonstrated. No evidence of significant pulmonary embolus. Mild cardiac enlargement. No pericardial effusions. Normal caliber thoracic aorta. Calcification of the aorta and coronary arteries. Mediastinum/Nodes: Esophagus is decompressed. No significant lymphadenopathy in the chest. Small esophageal hiatal hernia containing fat with paraesophageal soft tissue possibly representing fat necrosis related to the hernia. No change since prior study. Lungs/Pleura: Emphysematous changes in the lungs. Slight fibrosis in the lung bases. No airspace disease or consolidation. No pleural effusion or pneumothorax. Upper Abdomen: Nodular liver contour suggesting cirrhosis. Spleen is mildly enlarged. Cholelithiasis. No inflammatory changes. Musculoskeletal: No chest wall abnormality. No acute or significant osseous findings. Review of the MIP images confirms the above findings. IMPRESSION: 1. No evidence of significant pulmonary embolus. 2. Esophageal hiatal hernia with fluid and infiltration in the paraesophageal fat probably representing fat necrosis. No change. 3. Emphysematous changes and fibrosis in the lungs. No focal consolidation. 4. Nodular liver contour  suggesting cirrhosis with mildly enlarged spleen. 5. Cholelithiasis. Electronically Signed   By: Burman Nieves M.D.   On: 02/18/2024 01:55   DG Chest Port 1 View Result Date: 02/17/2024 CLINICAL DATA:  Chest pain EXAM: PORTABLE CHEST 1 VIEW COMPARISON:  11/19/2023 FINDINGS: The heart size and mediastinal contours are within normal limits. Both lungs are clear. The visualized skeletal structures are unremarkable. IMPRESSION: No active disease. Electronically Signed   By: Alcide Clever M.D.   On: 02/17/2024 23:41    Labs: Basic Metabolic Panel: Recent Labs  Lab 02/17/24 2331 02/18/24 1027 02/19/24 0604 02/22/24 0301  NA 138 134* 137 134*  K 3.2* 3.8 3.4* 3.7  CL 104 102 105 104  CO2 13* 17* 23 24  GLUCOSE 198* 143* 116* 89  BUN 19 16 17 13   CREATININE 1.27* 0.94 0.88 1.04*  CALCIUM 7.7* 7.7* 8.7* 8.6*  MG 1.8  --  1.7  --   PHOS 4.0  --   --   --    CBC: Recent Labs  Lab 02/17/24 2331 02/19/24 0604 02/20/24 0520 02/21/24 0402 02/22/24 0301  WBC 3.7* 2.5* 3.7* 3.0* 3.3*  NEUTROABS 2.4  --   --   --   --   HGB 10.7* 9.5* 10.7* 9.5* 9.5*  HCT 36.1 29.8* 33.0* 29.5* 30.0*  MCV 93.3 85.9 87.5 85.5 88.2  PLT 253 144* 131* 109* 103*   Microbiology: Results for orders placed or performed during the hospital encounter of 02/17/24  Resp panel by RT-PCR (RSV, Flu A&B, Covid) Anterior Nasal Swab     Status: None   Collection Time: 02/17/24 11:31 PM   Specimen: Anterior Nasal Swab  Result Value Ref Range Status   SARS Coronavirus 2 by RT PCR NEGATIVE NEGATIVE Final    Comment: (NOTE) SARS-CoV-2 target nucleic acids are NOT DETECTED.  The SARS-CoV-2 RNA is generally detectable in upper respiratory specimens during the acute phase of infection. The lowest concentration of SARS-CoV-2 viral copies this assay can detect is 138 copies/mL. A negative result does not preclude SARS-Cov-2 infection and should not be used as the sole basis for treatment or other patient management  decisions. A negative result may occur with  improper specimen collection/handling, submission of specimen other than nasopharyngeal swab, presence of viral mutation(s) within the areas targeted by this assay, and inadequate number of viral copies(<138 copies/mL). A negative result must be combined with clinical observations, patient history, and epidemiological information. The expected result is Negative.  Fact Sheet for Patients:  BloggerCourse.com  Fact Sheet for Healthcare Providers:  SeriousBroker.it  This test is no t yet approved or cleared by the [place] FDA and  has been authorized for detection and/or diagnosis of SARS-CoV-2 by FDA under an Emergency Use Authorization (EUA). This EUA will remain  in effect (meaning this test can be used) for the duration of the COVID-19 declaration under Section 564(b)(1) of the Act, 21 U.S.C.section 360bbb-3(b)(1), unless the authorization is terminated  or revoked sooner.       Influenza A by PCR NEGATIVE NEGATIVE Final   Influenza B by PCR NEGATIVE NEGATIVE Final    Comment: (NOTE) The Xpert Xpress SARS-CoV-2/FLU/RSV plus assay is intended as an aid in the diagnosis of influenza from Nasopharyngeal swab specimens and should not be used as a sole basis for treatment. Nasal washings and aspirates are unacceptable for Xpert Xpress SARS-CoV-2/FLU/RSV testing.  Fact Sheet for Patients: BloggerCourse.com  Fact Sheet for Healthcare Providers: SeriousBroker.it  This test is not yet approved or cleared by the [place] FDA and has been authorized for detection and/or diagnosis of SARS-CoV-2 by FDA under an Emergency Use Authorization (EUA). This EUA will remain in effect (meaning this test can be used) for the duration of the COVID-19 declaration under Section 564(b)(1) of the Act, 21 U.S.C. section 360bbb-3(b)(1), unless the  authorization is terminated or revoked.     Resp Syncytial Virus by PCR NEGATIVE NEGATIVE Final    Comment: (NOTE) Fact Sheet for Patients: BloggerCourse.com  Fact Sheet for Healthcare Providers: SeriousBroker.it  This test is not yet approved or cleared by the [place] FDA and has been authorized for detection and/or diagnosis of SARS-CoV-2 by FDA under an Emergency Use Authorization (EUA). This EUA will remain in effect (meaning this test can be used) for the duration of the COVID-19 declaration under Section 564(b)(1) of the Act, 21 U.S.C. section 360bbb-3(b)(1), unless the authorization is  terminated or revoked.  Performed at [facility], [address] [place], [place] [address]     Time coordinating discharge: Over 30 minutes  Leeroy Bock, MD  [facility] 02/22/2024, 11:30 AM

## 2024-03-13 ENCOUNTER — Ambulatory Visit: Admitting: Physician Assistant

## 2024-03-23 IMAGING — CT CT ANGIO CHEST
2 of 7 series · 17 of 46 positions shown · IV contrast (APPLIED)
Comparison: 01/03/2022 CT abdomen pelvis.

CLINICAL DATA: Flank pain and abdominal pain



[Series 6: thins · axial · 0.69mm/px · z∈[-429,-191]mm · 14 of 384 slices shown]
[im 22/384  lung]
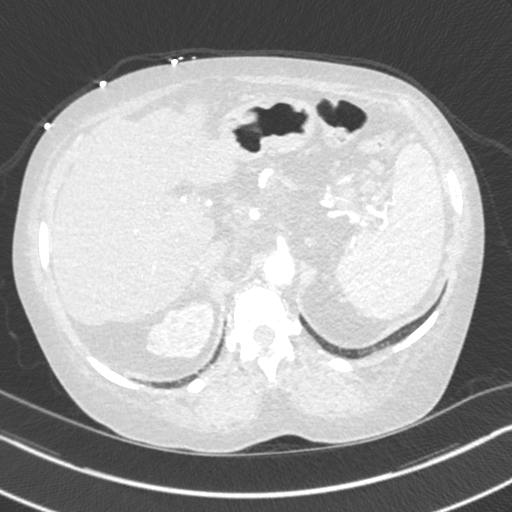
[im 43/384  soft-tissue]
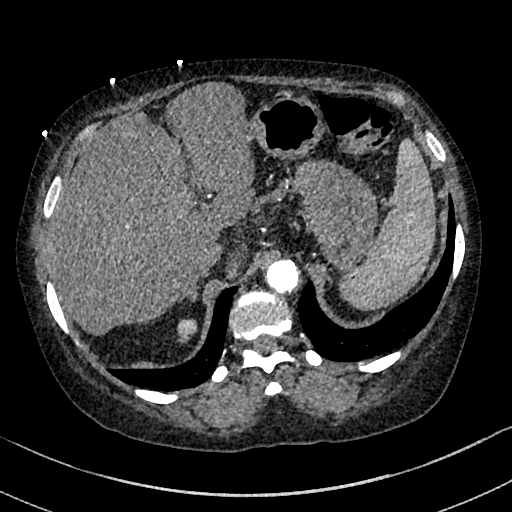
[im 86/384  lung]
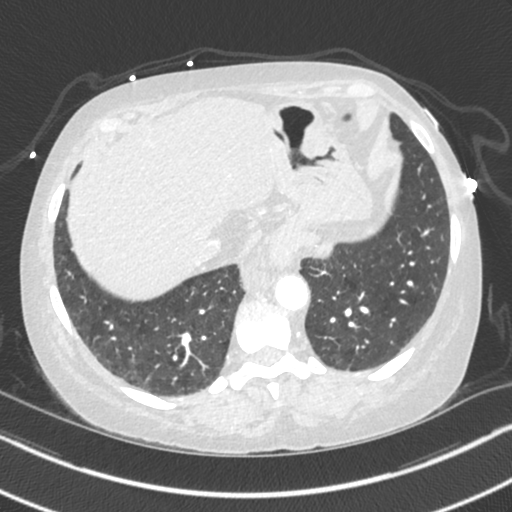
[im 107/384  soft-tissue]
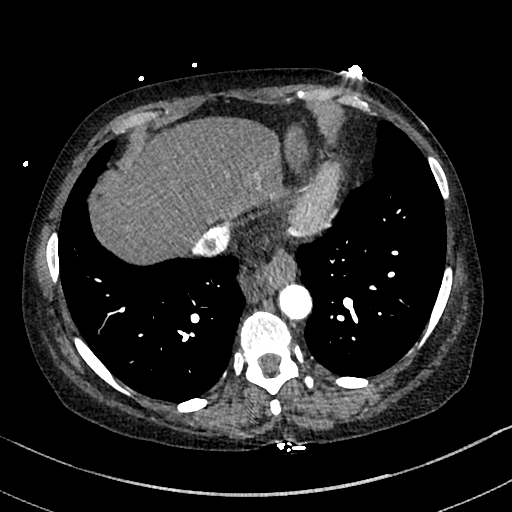
[im 128/384  lung]
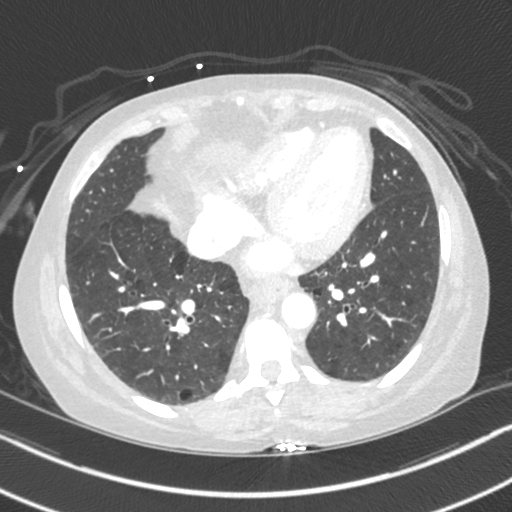
[im 149/384  soft-tissue]
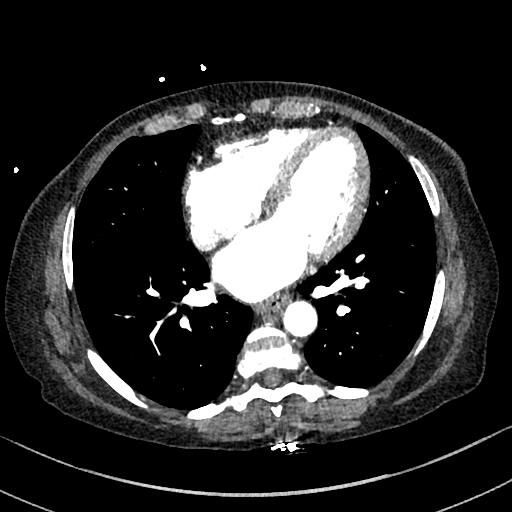
[im 171/384  lung]
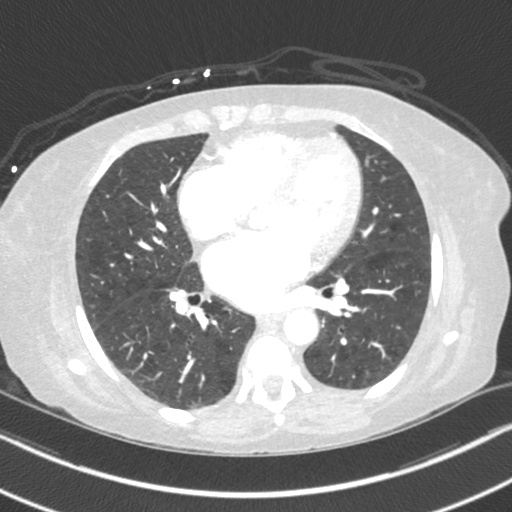
[im 213/384  soft-tissue]
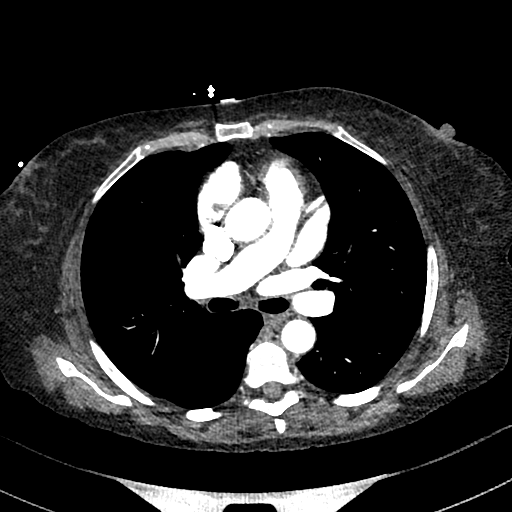
[im 235/384  lung]
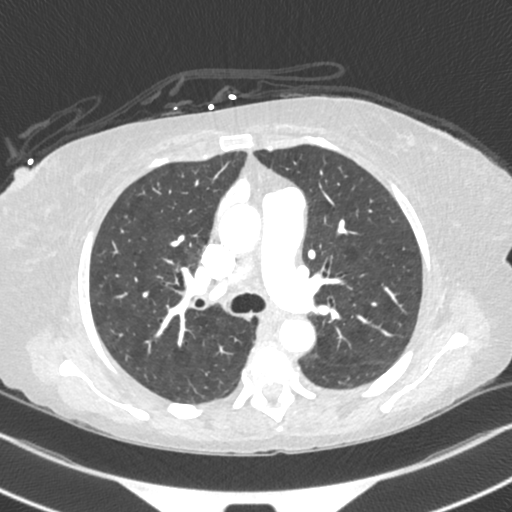
[im 256/384  soft-tissue]
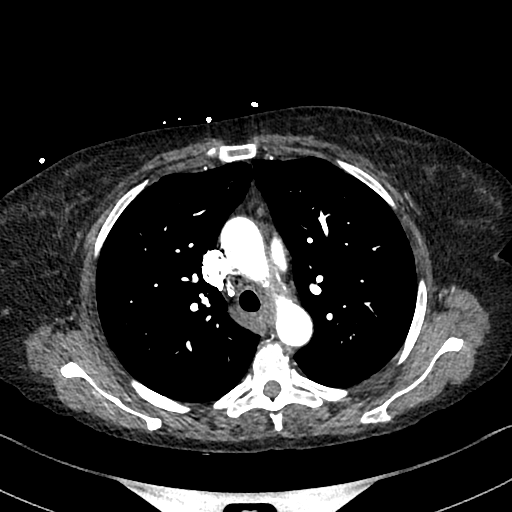
[im 277/384  lung]
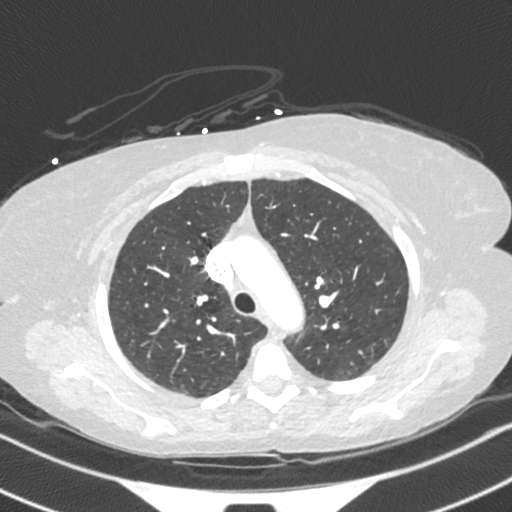
[im 298/384  soft-tissue]
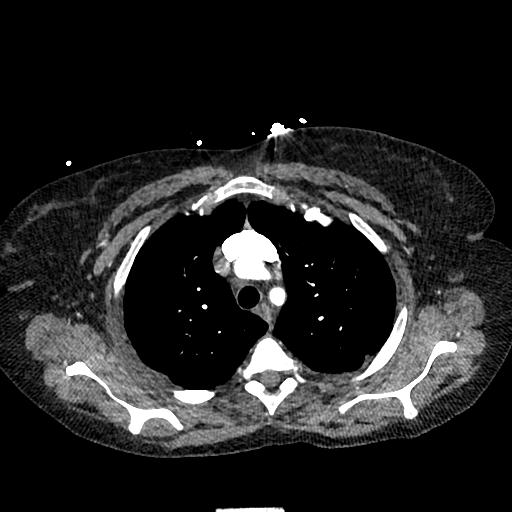
[im 341/384  lung]
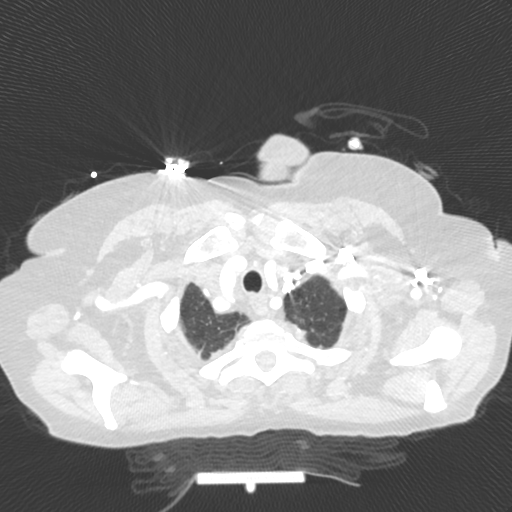
[im 362/384  soft-tissue]
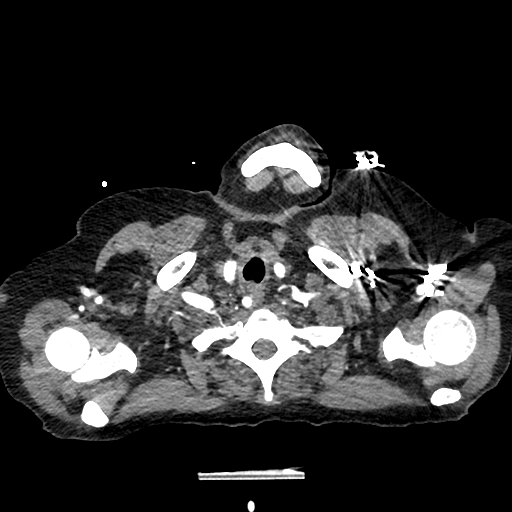

[Series 7: cor · coronal · 0.53mm/px · 3 of 140 slices shown]
[im 35/140  soft-tissue]
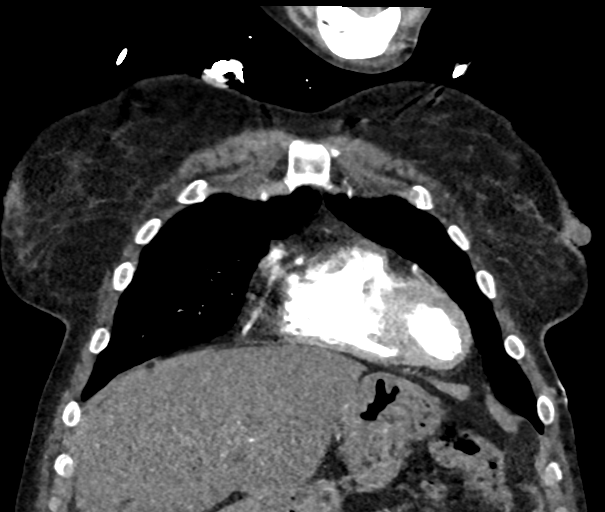
[im 70/140  soft-tissue]
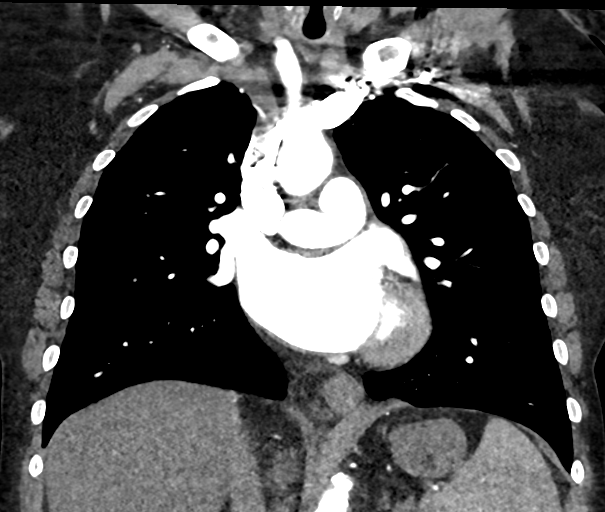
[im 105/140  soft-tissue]
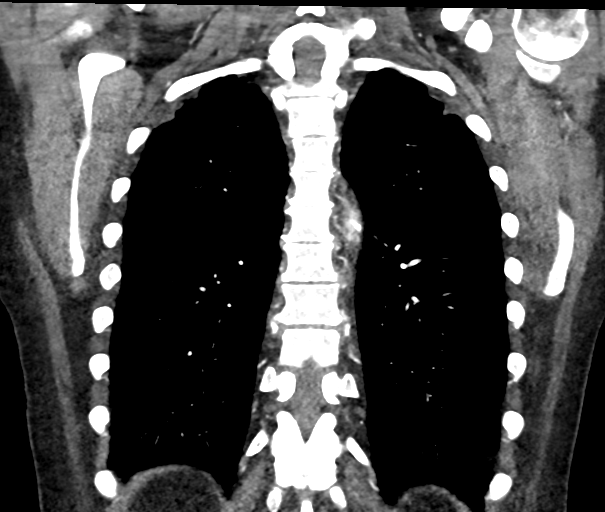

[17 of 46 positions shown; findings below may reference images not displayed]

RADIATION DOSE REDUCTION: This exam was performed according to the
departmental dose-optimization program which includes automated
exposure control, adjustment of the mA and/or kV according to
patient size and/or use of iterative reconstruction technique.

CONTRAST:  100mL OMNIPAQUE IOHEXOL 350 MG/ML SOLN
FINDINGS: CTA CHEST FINDINGS

Cardiovascular: Contrast injection is sufficient to demonstrate
satisfactory opacification of the pulmonary arteries to the
segmental level. There is no pulmonary embolus. The main pulmonary
artery is within normal limits for size. There is no CT evidence of
acute right heart strain. Calcific aortic atherosclerosis. No acute
aortic syndrome. There is a normal 3-vessel arch branching pattern.
Heart size is normal, without pericardial effusion.

Mediastinum/Nodes: No mediastinal, hilar or axillary
lymphadenopathy. The visualized thyroid and thoracic esophageal
course are unremarkable.

Lungs/Pleura: No pulmonary nodules or masses. No pleural effusion or
pneumothorax. No focal airspace consolidation. No focal pleural
abnormality.

Musculoskeletal: No chest wall abnormality. No acute or significant
osseous findings.

Review of the MIP images confirms the above findings.

CT ABDOMEN and PELVIS FINDINGS

Hepatobiliary: Normal hepatic contours and density. No visible
biliary dilatation. Cholelithiasis without acute inflammation.

Pancreas: Normal contours without ductal dilatation. No
peripancreatic fluid collection.

Spleen: Mild stranding adjacent to the spleen is decreased since
01/03/2022.

Adrenals/Urinary Tract:

--Adrenal glands: Normal.

--Right kidney/ureter: Mild atrophy with lower pole simple renal
cyst measuring 3.1 cm (Bosniak 1, no follow-up required). No
nephrolithiasis or hydronephrosis.

--Left kidney/ureter: Mild atrophy. No nephroureterolithiasis or
hydronephrosis.

--Urinary bladder: Unremarkable.

Stomach/Bowel:

--Stomach/Duodenum: No hiatal hernia or other gastric abnormality.
Normal duodenal course and caliber.

--Small bowel: No dilatation or inflammation.

--Colon: No focal abnormality.

--Appendix: Normal.

Vascular/Lymphatic: Normal course and caliber of the major abdominal
vessels. Portal vein is patent. No residual thrombus is identified.
There is calcific aortic atherosclerosis. Small caliber gastric and
esophageal varices. No abdominal or pelvic lymphadenopathy.

Reproductive: Uterine calcifications, likely small fibroids. No
adnexal mass.

Musculoskeletal. No bony spinal canal stenosis or focal osseous
abnormality.

Other: Mild mesenteric stranding may be due to portal hypertension
or residual a from a prior Evaldukas of pancreatitis.
IMPRESSION: 1. No pulmonary embolus or acute aortic syndrome.
2. Findings of portal hypertension without acute abnormality of the
chest, abdomen or pelvis.
3. Patent portal vein without residual thrombus.
4. Cholelithiasis without acute inflammation.

Aortic Atherosclerosis ([id number]).

## 2024-03-23 IMAGING — CT CT ABD-PELV W/ CM
2 of 5 series · 14 of 46 positions shown, 16 images · IV contrast (APPLIED)
Comparison: 01/03/2022 CT abdomen pelvis.

CLINICAL DATA: Flank pain and abdominal pain



[Series 2: abdomen 5.0 · axial · 0.72mm/px · z∈[-751,-351]mm · 11 of 94 slices shown, 13 images]
[im 7/94  soft-tissue]
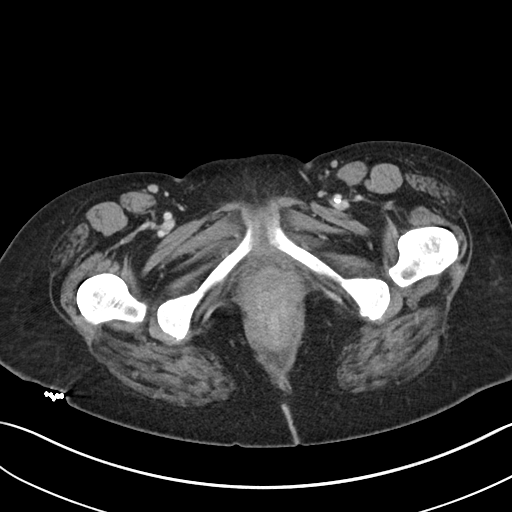
[im 7/94  bone]
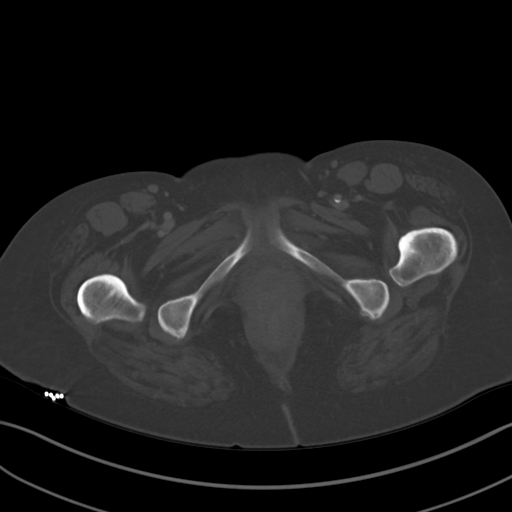
[im 14/94  soft-tissue]
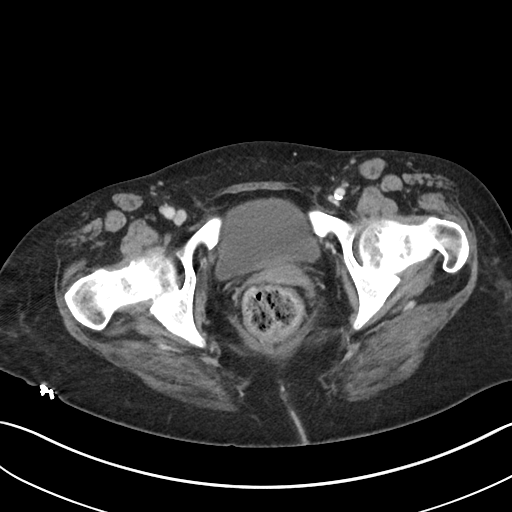
[im 20/94  soft-tissue]
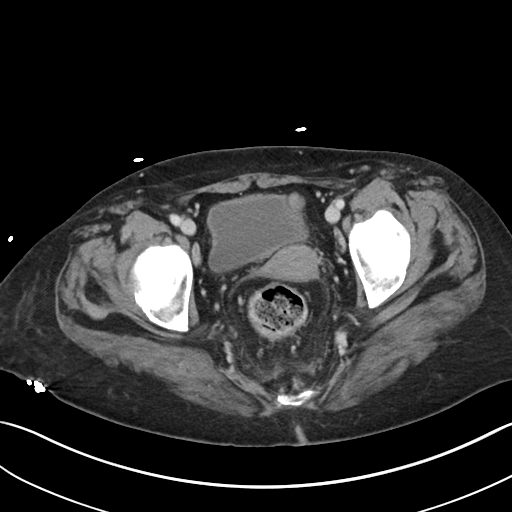
[im 34/94  soft-tissue]
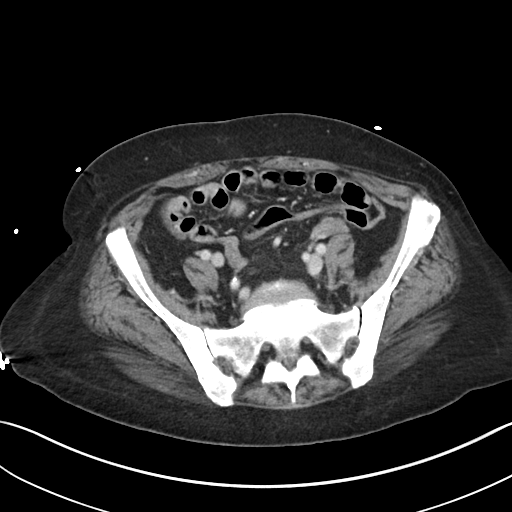
[im 40/94  soft-tissue]
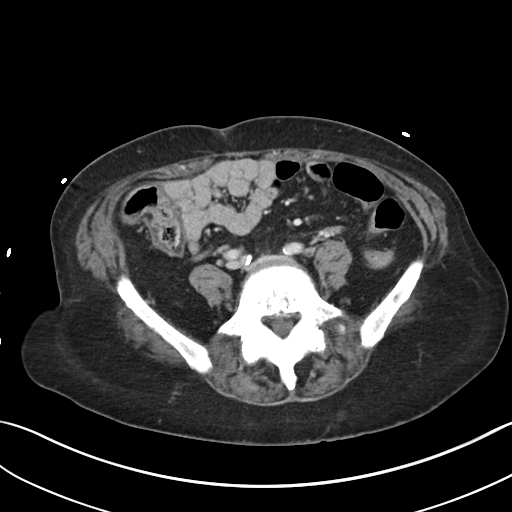
[im 47/94  soft-tissue]
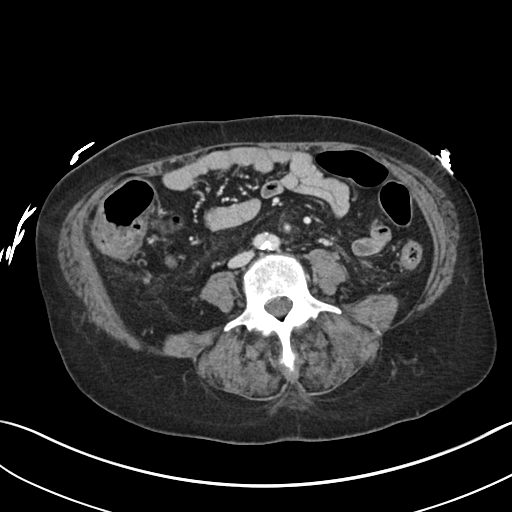
[im 54/94  soft-tissue]
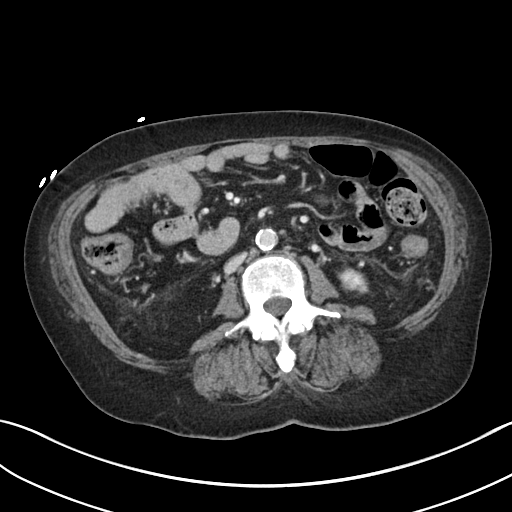
[im 60/94  soft-tissue]
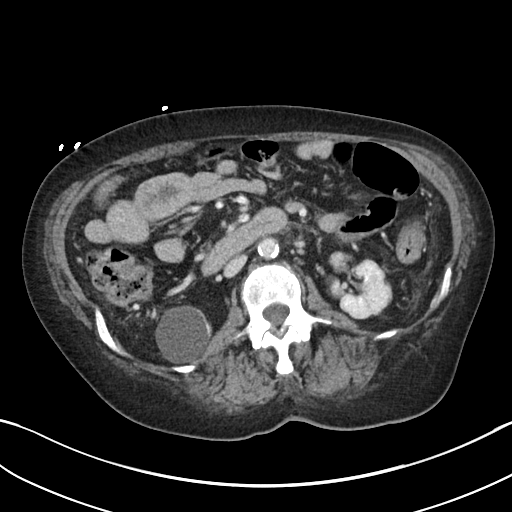
[im 74/94  soft-tissue]
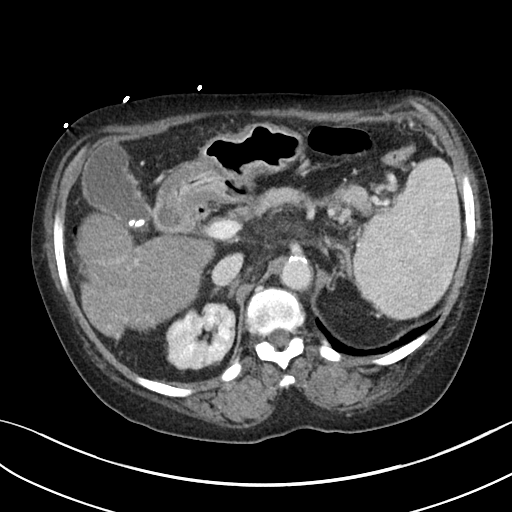
[im 74/94  bone]
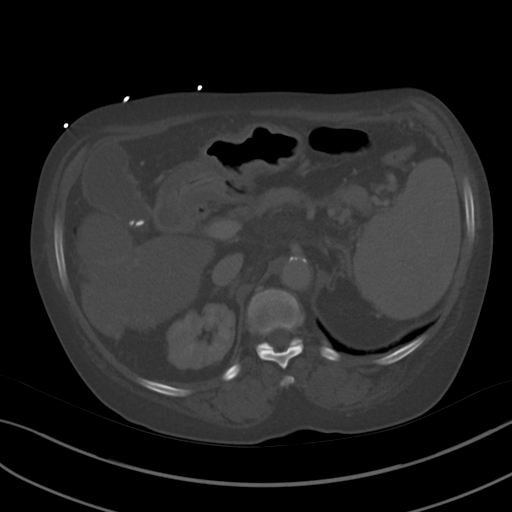
[im 80/94  soft-tissue]
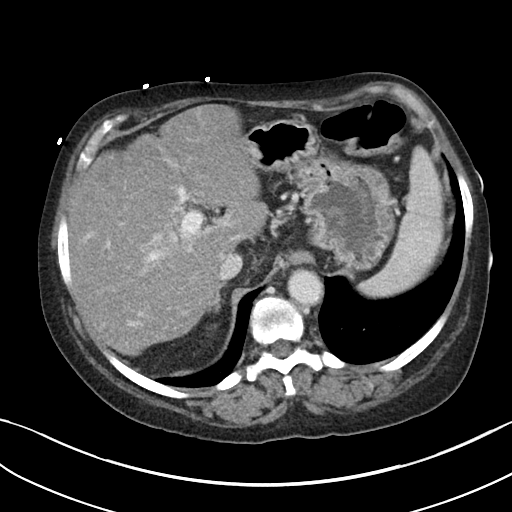
[im 87/94  soft-tissue]
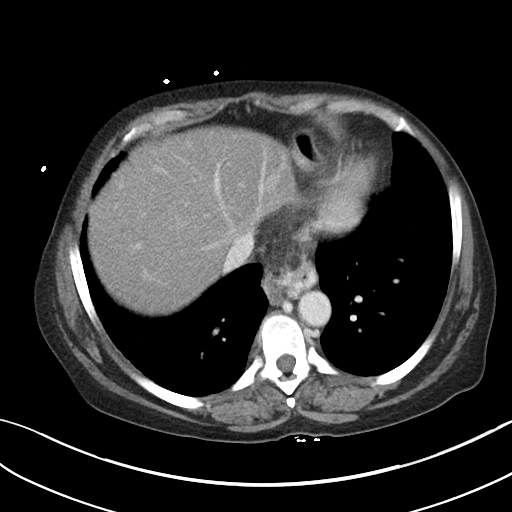

[Series 5: abdomen 3.0 mpr cor · coronal · 0.68mm/px · 3 of 93 slices shown]
[im 31/93  soft-tissue]
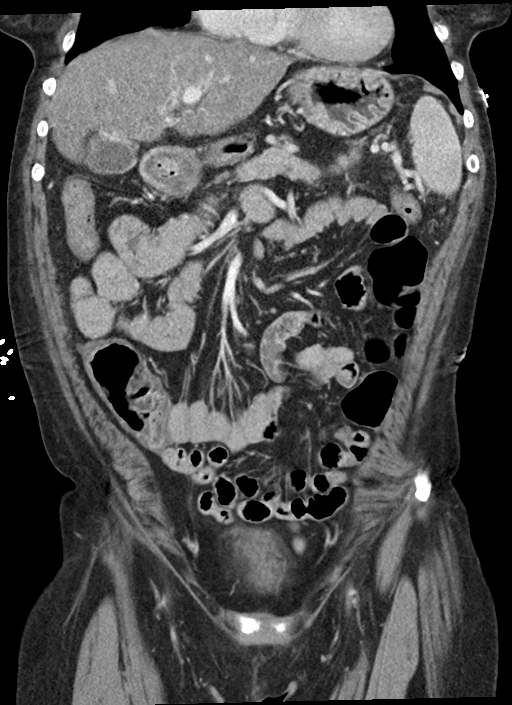
[im 41/93  soft-tissue]
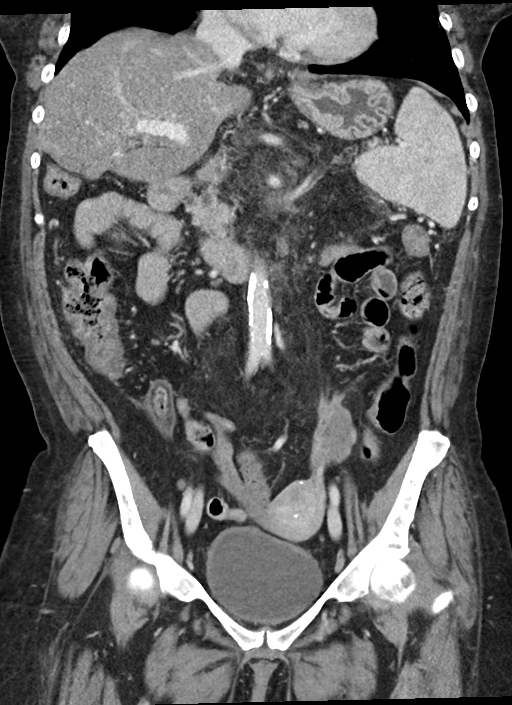
[im 52/93  soft-tissue]
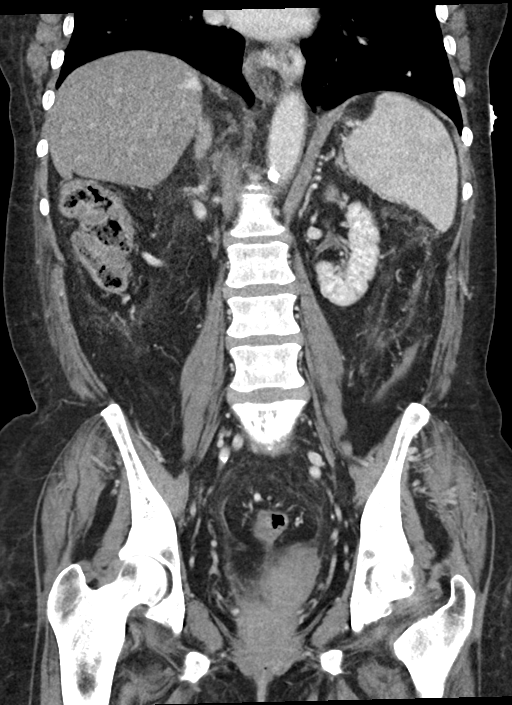

[14 of 46 positions shown; findings below may reference images not displayed]

RADIATION DOSE REDUCTION: This exam was performed according to the
departmental dose-optimization program which includes automated
exposure control, adjustment of the mA and/or kV according to
patient size and/or use of iterative reconstruction technique.

CONTRAST:  100mL OMNIPAQUE IOHEXOL 350 MG/ML SOLN
FINDINGS: CTA CHEST FINDINGS

Cardiovascular: Contrast injection is sufficient to demonstrate
satisfactory opacification of the pulmonary arteries to the
segmental level. There is no pulmonary embolus. The main pulmonary
artery is within normal limits for size. There is no CT evidence of
acute right heart strain. Calcific aortic atherosclerosis. No acute
aortic syndrome. There is a normal 3-vessel arch branching pattern.
Heart size is normal, without pericardial effusion.

Mediastinum/Nodes: No mediastinal, hilar or axillary
lymphadenopathy. The visualized thyroid and thoracic esophageal
course are unremarkable.

Lungs/Pleura: No pulmonary nodules or masses. No pleural effusion or
pneumothorax. No focal airspace consolidation. No focal pleural
abnormality.

Musculoskeletal: No chest wall abnormality. No acute or significant
osseous findings.

Review of the MIP images confirms the above findings.

CT ABDOMEN and PELVIS FINDINGS

Hepatobiliary: Normal hepatic contours and density. No visible
biliary dilatation. Cholelithiasis without acute inflammation.

Pancreas: Normal contours without ductal dilatation. No
peripancreatic fluid collection.

Spleen: Mild stranding adjacent to the spleen is decreased since
01/03/2022.

Adrenals/Urinary Tract:

--Adrenal glands: Normal.

--Right kidney/ureter: Mild atrophy with lower pole simple renal
cyst measuring 3.1 cm (Bosniak 1, no follow-up required). No
nephrolithiasis or hydronephrosis.

--Left kidney/ureter: Mild atrophy. No nephroureterolithiasis or
hydronephrosis.

--Urinary bladder: Unremarkable.

Stomach/Bowel:

--Stomach/Duodenum: No hiatal hernia or other gastric abnormality.
Normal duodenal course and caliber.

--Small bowel: No dilatation or inflammation.

--Colon: No focal abnormality.

--Appendix: Normal.

Vascular/Lymphatic: Normal course and caliber of the major abdominal
vessels. Portal vein is patent. No residual thrombus is identified.
There is calcific aortic atherosclerosis. Small caliber gastric and
esophageal varices. No abdominal or pelvic lymphadenopathy.

Reproductive: Uterine calcifications, likely small fibroids. No
adnexal mass.

Musculoskeletal. No bony spinal canal stenosis or focal osseous
abnormality.

Other: Mild mesenteric stranding may be due to portal hypertension
or residual a from a prior Evaldukas of pancreatitis.
IMPRESSION: 1. No pulmonary embolus or acute aortic syndrome.
2. Findings of portal hypertension without acute abnormality of the
chest, abdomen or pelvis.
3. Patent portal vein without residual thrombus.
4. Cholelithiasis without acute inflammation.

Aortic Atherosclerosis ([id number]).

## 2024-03-30 ENCOUNTER — Ambulatory Visit: Admitting: Physician Assistant

## 2024-04-10 ENCOUNTER — Encounter: Payer: Self-pay | Admitting: Physician Assistant

## 2024-04-10 ENCOUNTER — Ambulatory Visit: Attending: Physician Assistant | Admitting: Physician Assistant

## 2024-04-10 VITALS — BP 144/66 | HR 66 | Ht 64.0 in | Wt 188.2 lb

## 2024-04-10 DIAGNOSIS — I10 Essential (primary) hypertension: Secondary | ICD-10-CM

## 2024-04-10 DIAGNOSIS — Z79899 Other long term (current) drug therapy: Secondary | ICD-10-CM

## 2024-04-10 DIAGNOSIS — I2510 Atherosclerotic heart disease of native coronary artery without angina pectoris: Secondary | ICD-10-CM

## 2024-04-10 DIAGNOSIS — I480 Paroxysmal atrial fibrillation: Secondary | ICD-10-CM | POA: Diagnosis not present

## 2024-04-10 DIAGNOSIS — K7030 Alcoholic cirrhosis of liver without ascites: Secondary | ICD-10-CM

## 2024-04-10 DIAGNOSIS — I8500 Esophageal varices without bleeding: Secondary | ICD-10-CM

## 2024-04-10 DIAGNOSIS — I5189 Other ill-defined heart diseases: Secondary | ICD-10-CM

## 2024-04-10 DIAGNOSIS — D869 Sarcoidosis, unspecified: Secondary | ICD-10-CM

## 2024-04-10 DIAGNOSIS — D61818 Other pancytopenia: Secondary | ICD-10-CM

## 2024-04-10 NOTE — Progress Notes (Signed)
 Cardiology Office Note    Date:  04/10/2024   ID:  Kathryn [name], DOB 01/19/1956, MRN [id number]  PCP:  Tawnya Fava, Waddell Guess, MD  Cardiologist:  Antionette Kirks, MD  Electrophysiologist:  None   Chief Complaint: Hospital follow-up  History of Present Illness:   Kathryn [name] is a 68 y.o. 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  Echo in 06/2021 showed an EF of 60 to 65%, no regional wall motion abnormalities, moderate LVH, grade 1 diastolic dysfunction, normal RV systolic function and ventricular cavity size, moderately dilated left atrium, and mild to moderate mitral regurgitation.  Echo in 12/2021 showed an EF of 65 to 70%, mild intracavitary gradient with no significant change with Valsalva, moderate asymmetric LVH of the basal septal segment, trivial mitral regurgitation, mild aortic insufficiency with tricuspid aortic valve, normal RVSP, and aortic atherosclerosis.  She was admitted to [facility] in 01/2024 with chest tightness, shortness of breath, and palpitations.  She was found to be in A-fib with RVR.  She spontaneously converted to sinus rhythm.  Ethanol level elevated at 219.  High-sensitivity troponin trended to 3476.  BNP 278.  Echo showed an EF of 65 to 70%, no regional wall motion abnormalities, severe asymmetric LVH of the basal-septal segment, normal RV systolic function and ventricular cavity size, moderately elevated RVSP estimated at 49.8 mmHg, moderately dilated left atrium, mildly dilated right atrium, moderate mitral regurgitation, mild to moderate tricuspid regurgitation, aortic valve sclerosis without evidence of stenosis, and an estimated  right atrial pressure of 3 mmHg.  LHC on 02/21/2024 showed mild to moderate nonobstructive CAD including 20 to 30% mid LAD and ostial LCx stenoses as well as 60 to 70% apical LAD stenosis and a mildly elevated LVEDP estimated at 21 mmHg.  Prior to discharge she reported her bleeding symptoms were well-controlled on Protonix .  HAS-BLED score of 4.  She was interested in moving forward with anticoagulation and was started on apixaban  with recommendation to follow-up with EP as an outpatient to determine if she would be a candidate for left atrial appendage occlusion.  She comes in doing well from a cardiac perspective and is without symptoms of angina or cardiac decompensation.  No palpitations, dizziness, presyncope, or syncope.  No significant lower extremity swelling or progressive orthopnea.  Reports adherence to apixaban  twice daily.  She reports 1 episode of mild BRBPR in the setting of constipated bowel movement approximately 1 week ago with no further BRBPR with bowel movements since.  No melena, hematochezia, hemoptysis, or hematuria.  Has abstained from alcohol use.  Reports that she was diagnosed with pulmonary sarcoidosis and underwent therapy at [facility] in 1997 (further details are unclear).   Labs independently reviewed: 02/2024 - potassium 3.7, BUN 13, serum creatinine 1.04, Hgb 9.5, PLT 103 01/2024 - magnesium  1.7, albumin 2.8, AST 55, ALT normal, LP(a) 43.4, TSH 9.815, free T4 low at 0.51, ethanol 219 07/2023 - A1c 5.6 06/2021 - TC 232, TG 67, HDL 108, LDL 111  Past Medical History:  Diagnosis Date   Alcoholic cirrhosis of liver (HCC)    Asthma    CHF (congestive heart failure) (HCC)    Chronic disease anemia    Esophageal varices (HCC)    GR I  on EGD by Dr Leatrice Provost 09/2014   ETOH abuse    Hypertension    Hypothyroidism    Murmur    Portal hypertension (HCC)    Sarcoidosis     Past Surgical History:  Procedure Laterality Date   ESOPHAGOGASTRODUODENOSCOPY N/A 06/26/2021   Procedure:  ESOPHAGOGASTRODUODENOSCOPY (EGD);  Surgeon: Toledo, Alphonsus Jeans, MD;  Location: [facility] ENDOSCOPY;  Service: Gastroenterology;  Laterality: N/A;   ESOPHAGOGASTRODUODENOSCOPY (EGD) WITH PROPOFOL  N/A 04/04/2015   Procedure: ESOPHAGOGASTRODUODENOSCOPY (EGD) WITH PROPOFOL ;  Surgeon: Marnee Sink, MD;  Location: [facility] ENDOSCOPY;  Service: Endoscopy;  Laterality: N/A;  Dr Ole Berkeley prefers 1445   ESOPHAGOGASTRODUODENOSCOPY (EGD) WITH PROPOFOL  N/A 12/04/2022   Procedure: ESOPHAGOGASTRODUODENOSCOPY (EGD) WITH PROPOFOL ;  Surgeon: Luke Salaam, MD;  Location: [facility] ENDOSCOPY;  Service: Gastroenterology;  Laterality: N/A;   ESOPHAGOGASTRODUODENOSCOPY W/ BANDING  09/2014   Rein   LEFT HEART CATH AND CORONARY ANGIOGRAPHY N/A 02/21/2024   Procedure: LEFT HEART CATH AND CORONARY ANGIOGRAPHY;  Surgeon: Sammy Crisp, MD;  Location: [facility] INVASIVE CV LAB;  Service: Cardiovascular;  Laterality: N/A;   TUBAL LIGATION      Current Medications: Current Meds  Medication Sig   albuterol  (VENTOLIN  HFA) 108 (90 Base) MCG/ACT inhaler Inhale 1-2 puffs into the lungs every 4 (four) hours as needed for wheezing or shortness of breath.   apixaban  (ELIQUIS ) 5 MG TABS tablet Take 1 tablet (5 mg total) by mouth 2 (two) times daily.   atorvastatin  (LIPITOR) 10 MG tablet Take 1 tablet (10 mg total) by mouth daily.   folic acid  (FOLVITE ) 1 MG tablet Take 1 tablet (1 mg total) by mouth daily.   levothyroxine  (SYNTHROID ) 125 MCG tablet Take 1 tablet (125 mcg total) by mouth daily at 6 (six) AM.   Multiple Vitamin (MULTIVITAMIN) tablet Take 1 tablet by mouth every other day.   nadolol  (CORGARD ) 40 MG tablet Take 2 tablets (80 mg total) by mouth daily.   thiamine  (VITAMIN B-1) 100 MG tablet Take 1 tablet (100 mg total) by mouth daily.    Allergies:   Oxycodone -acetaminophen , Hydrocodone-acetaminophen , Ibuprofen, Naproxen, Propoxyphene, Shellfish allergy, and Tamiflu  [oseltamivir phosphate]   Social History   Socioeconomic History   Marital  status: Divorced    Spouse name: Not on file   Number of children: Not on file   Years of education: Not on file   Highest education level: Not on file  Occupational History   Not on file  Tobacco Use   Smoking status: Never   Smokeless tobacco: Never  Vaping Use   Vaping status: Never Used  Substance and Sexual Activity   Alcohol use: Yes    Alcohol/week: 15.0 standard drinks of alcohol    Types: 15 Shots of liquor per week   Drug use: No   Sexual activity: Not Currently  Other Topics Concern   Not on file  Social History Narrative   She lives with 2 grandsons   Social Drivers of Corporate investment banker Strain: Not on file  Food Insecurity: No Food Insecurity (02/18/2024)   Hunger Vital Sign    Worried About Running Out of Food in the Last Year: Never true    Ran Out of Food in the Last Year: Never true  Recent Concern: Food Insecurity - Food Insecurity Present (12/08/2023)   Hunger Vital Sign    Worried About Running Out of Food in the Last Year: Sometimes true    Ran Out of Food in the Last Year: Sometimes true  Transportation Needs: No  Transportation Needs (02/18/2024)   PRAPARE - Administrator, Civil Service (Medical): No    Lack of Transportation (Non-Medical): No  Physical Activity: Not on file  Stress: Not on file  Social Connections: Unknown (02/18/2024)   Social Connection and Isolation Panel [NHANES]    Frequency of Communication with Friends and Family: Twice a week    Frequency of Social Gatherings with Friends and Family: Twice a week    Attends Religious Services: Patient declined    Database administrator or Organizations: Patient declined    Attends Banker Meetings: Never    Marital Status: Divorced     Family History:  The patient's family history includes Aneurysm in her father; COPD in her father; Heart attack in her mother.  ROS:   12-point review of systems is negative unless otherwise noted in the  HPI.   EKGs/Labs/Other Studies Reviewed:    Studies reviewed were summarized above. The additional studies were reviewed today:  LHC 02/21/2024: Conclusions: Mild to moderate, nonobstructive coronary artery disease including 20-30% mid LAD and ostial LCx stenoses as well as 60-70% apical LAD lesion.  Findings suggestive of supply-demand mismatch. Mildly elevated left ventricular filling pressure (LVEDP 21 mmHg).   Recommendations: Escalate medical therapy and risk factor modification to prevent progression of CAD.  Will add low-dose statin therapy in the setting of chronic liver disease to target LDL less than 70. Maintain net even to slightly negative fluid balance in the setting of mildly elevated LVEDP suggestive of diastolic dysfunction. Resume heparin  infusion 2 hours after TR band removal.  Consider transitioning to DOAC as soon as tomorrow if no evidence of bleeding or vascular injury. __________  2D echo 02/20/2024: 1. Left ventricular ejection fraction, by estimation, is 65 to 70%. The  left ventricle has normal function. The left ventricle has no regional  wall motion abnormalities. There is severe asymmetric left ventricular  hypertrophy of the basal-septal  segment. Left ventricular diastolic parameters are indeterminate.   2. Right ventricular systolic function is normal. The right ventricular  size is normal. There is moderately elevated pulmonary artery systolic  pressure. The estimated right ventricular systolic pressure is 49.8 mmHg.   3. Left atrial size was moderately dilated.   4. Right atrial size was mildly dilated.   5. The mitral valve is abnormal. Moderate mitral valve regurgitation. No  evidence of mitral stenosis.   6. Tricuspid valve regurgitation is mild to moderate.   7. The aortic valve is tricuspid. There is mild calcification of the  aortic valve. There is mild thickening of the aortic valve. Aortic valve  regurgitation is not visualized. Aortic valve  sclerosis is present, with  no evidence of aortic valve stenosis.   8. The inferior vena cava is normal in size with greater than 50%  respiratory variability, suggesting right atrial pressure of 3 mmHg.   Comparison(s): Prior images reviewed side by side. Changes from prior  study are noted. MR and TR more significant compared to prior, elevated  RVSP.  __________  Limited echo 01/04/2022: 1. Mild intracavitary gradient with no significant change with Valsalva.  Peak velocity 1.2 m/s. Peak gradient 5 mmHg. Left ventricular ejection  fraction, by estimation, is 65 to 70%. The left ventricle has normal  function. There is moderate asymmetric  left ventricular hypertrophy of the basal-septal segment. Left ventricular  diastolic parameters were normal.   2. Trivial mitral valve regurgitation.   3. The aortic valve is tricuspid. Aortic valve regurgitation  is mild.   4. There is Moderate (Grade III) atheroma plaque involving the ascending  aorta.   5. There is normal pulmonary artery systolic pressure.  __________  2D echo 06/25/2021: 1. Left ventricular ejection fraction, by estimation, is 60 to 65%. The  left ventricle has normal function. The left ventricle has no regional  wall motion abnormalities. There is moderate left ventricular hypertrophy.  Left ventricular diastolic  parameters are consistent with Grade I diastolic dysfunction (impaired  relaxation).   2. Right ventricular systolic function is normal. The right ventricular  size is normal.   3. Left atrial size was moderately dilated.   4. The mitral valve is normal in structure. Mild to moderate mitral valve  regurgitation.     EKG:  EKG is ordered today.  The EKG ordered today demonstrates NSR, 66 bpm, no acute ST-T changes  Recent Labs: 02/17/2024: B Natriuretic Peptide 278.3; TSH 9.815 02/19/2024: ALT 15; Magnesium  1.7 02/22/2024: BUN 13; Creatinine, Ser 1.04; Hemoglobin 9.5; Platelets 103; Potassium 3.7; Sodium 134   Recent Lipid Panel    Component Value Date/Time   CHOL 232 (H) 06/25/2021 0339   TRIG 73 12/02/2022 1115   HDL 108 06/25/2021 0339   CHOLHDL 2.1 06/25/2021 0339   VLDL 13 06/25/2021 0339   LDLCALC 111 (H) 06/25/2021 0339    PHYSICAL EXAM:    VS:  BP (!) 144/66   Pulse 66   Ht 5\' 4"  (1.626 m)   Wt 188 lb 3.2 oz (85.4 kg)   SpO2 100%   BMI 32.30 kg/m   BMI: Body mass index is 32.3 kg/m.  Physical Exam Vitals reviewed.  Constitutional:      Appearance: She is well-developed.  HENT:     Head: Normocephalic and atraumatic.  Eyes:     General:        Right eye: No discharge.        Left eye: No discharge.  Cardiovascular:     Rate and Rhythm: Normal rate and regular rhythm.     Pulses:          Posterior tibial pulses are 2+ on the right side and 2+ on the left side.     Heart sounds: Normal heart sounds, S1 normal and S2 normal. Heart sounds not distant. No midsystolic click and no opening snap. No murmur heard.    No friction rub.  Pulmonary:     Effort: Pulmonary effort is normal. No respiratory distress.     Breath sounds: Normal breath sounds. No decreased breath sounds, wheezing, rhonchi or rales.  Chest:     Chest wall: No tenderness.  Musculoskeletal:     Cervical back: Normal range of motion.     Right lower leg: No edema.     Left lower leg: No edema.  Skin:    General: Skin is warm and dry.     Nails: There is no clubbing.  Neurological:     Mental Status: She is alert and oriented to person, place, and time.  Psychiatric:        Speech: Speech normal.        Behavior: Behavior normal.        Thought Content: Thought content normal.        Judgment: Judgment normal.     Wt Readings from Last 3 Encounters:  04/10/24 188 lb 3.2 oz (85.4 kg)  02/20/24 186 lb 8.2 oz (84.6 kg)  01/20/24 187 lb 12.8 oz (85.2 kg)  ASSESSMENT & PLAN:   Nonobstructive CAD/HLD: No symptoms suggestive of angina.  LHC in 01/2024 showed mild to moderate nonobstructive  CAD as outlined above.  Currently on apixaban  in place of aspirin  given underlying A-fib in an effort to minimize bleeding risk.  However, if patient does undergo watchman implantation down the road leading to discontinuation of OAC, would resume aspirin  81 mg daily thereafter.  LDL 111 in 06/2021 with target LDL less than 70.  Currently on atorvastatin  10 mg.  Obtain LFT, lipid panel, and direct LDL.  No indication for further ischemic testing at this time.  PAF: Maintaining sinus rhythm on nadolol  as outlined below.  CHA2DS2-VASc at least 4.  HAS-BLED 4 with an estimated rate of significant bleeding of 8.9%.  Seems to be tolerating apixaban  5 mg twice daily.  However, she is at increased bleeding risk.  Referred to EP for consideration of Watchman.  Obtain BMP and CBC.  Sarcoidosis: Reports undergoing therapy at [facility] in 1997 for pulmonary sarcoidosis.  Obtain cardiac MRI to evaluate for cardiac involvement.  May need to pursue myocardial PET scan.  Diastolic dysfunction: Euvolemic.  Optimal blood pressure control recommended.  HTN: Blood pressure is mildly elevated in the office today though she has not yet taken her nadolol .  Remains on nadolol  80 mg daily.  Defer resumption of prior spironolactone  to PCP.  Alcoholic cirrhosis with esophageal varices and severe portal hypertensive gastropathy status post APC complicated by pancytopenia: Ongoing management per PCP, GI, and hematology.  Obtain CBC as above.    Disposition: F/u with Dr. Alvenia Aus or an APP in 3 months.   Medication Adjustments/Labs and Tests Ordered: Current medicines are reviewed at length with the patient today.  Concerns regarding medicines are outlined above. Medication changes, Labs and Tests ordered today are summarized above and listed in the Patient Instructions accessible in Encounters.   SignedVarney Gentleman, PA-C 04/10/2024 12:30 PM     [facility] -  [address] [place], [place]  [address] [phone]

## 2024-04-10 NOTE — Patient Instructions (Signed)
 Medication Instructions:  Your physician recommends that you continue on your current medications as directed. Please refer to the Current Medication list given to you today.   *If you need a refill on your cardiac medications before your next appointment, please call your pharmacy*  Lab Work: Your provider would like for you to have following labs drawn today (CBC, CMP, Lipid, Direct LDL).    Testing/Procedures:   You are scheduled for Cardiac MRI at the location below.  Please arrive for your appointment at ______________ . ?   [facility] [address] [place], [place] [address] Please go to the [facility] and check-in with the desk attendant.   Magnetic resonance imaging (MRI) is a painless test that produces images of the inside of the body without using Xrays.  During an MRI, strong magnets and radio waves work together in a Data processing manager to form detailed images.   MRI images may provide more details about a medical condition than X-rays, CT scans, and ultrasounds can provide.  You may be given earphones to listen for instructions.  You may eat a light breakfast and take medications as ordered with the exception of furosemide , hydrochlorothiazide, chlorthalidone or spironolactone  (or any other fluid pill). If you are undergoing a stress MRI, please avoid stimulants for 12 hr prior to test. (I.e. Caffeine, nicotine , chocolate, or antihistamine medications)  If your provider has ordered anti-anxiety medications for this test, then you will need a driver.  An IV will be inserted into one of your veins. Contrast material will be injected into your IV. It will leave your body through your urine within a day. You may be told to drink plenty of fluids to help flush the contrast material out of your system.  You will be asked to remove all metal, including: Watch, jewelry, and other metal objects including hearing aids, hair pieces and dentures. Also  wearable glucose monitoring systems (ie. Freestyle Libre and Omnipods) (Braces and fillings normally are not a problem.)   TEST WILL TAKE APPROXIMATELY 1 HOUR  PLEASE NOTIFY SCHEDULING AT LEAST 24 HOURS IN ADVANCE IF YOU ARE UNABLE TO KEEP YOUR APPOINTMENT. [phone]  For more information and frequently asked questions, please visit our website : [email or web address]  Please call the Cardiac Imaging Nurse Navigators with any questions/concerns. [phone] Office    Follow-Up: At [facility], you and your health needs are our priority.  As part of our continuing mission to provide you with exceptional heart care, our providers are all part of one team.  This team includes your primary Cardiologist (physician) and Advanced Practice Providers or APPs (Physician Assistants and Nurse Practitioners) who all work together to provide you with the care you need, when you need it.  Your next appointment:   3 month(s)  Provider:   Varney Gentleman, PA-C

## 2024-04-11 ENCOUNTER — Ambulatory Visit: Payer: Self-pay | Admitting: Physician Assistant

## 2024-04-11 LAB — COMPREHENSIVE METABOLIC PANEL WITH GFR
ALT: 14 IU/L (ref 0–32)
AST: 27 IU/L (ref 0–40)
Albumin: 3.6 g/dL — ABNORMAL LOW (ref 3.9–4.9)
Alkaline Phosphatase: 95 IU/L (ref 44–121)
BUN/Creatinine Ratio: 12 (ref 12–28)
BUN: 12 mg/dL (ref 8–27)
Bilirubin Total: 0.4 mg/dL (ref 0.0–1.2)
CO2: 20 mmol/L (ref 20–29)
Calcium: 8.8 mg/dL (ref 8.7–10.3)
Chloride: 110 mmol/L — ABNORMAL HIGH (ref 96–106)
Creatinine, Ser: 1.01 mg/dL — ABNORMAL HIGH (ref 0.57–1.00)
Globulin, Total: 4 g/dL (ref 1.5–4.5)
Glucose: 79 mg/dL (ref 70–99)
Potassium: 4.2 mmol/L (ref 3.5–5.2)
Sodium: 143 mmol/L (ref 134–144)
Total Protein: 7.6 g/dL (ref 6.0–8.5)
eGFR: 61 mL/min/{1.73_m2} (ref >59)

## 2024-04-11 LAB — CBC
Hematocrit: 30.3 % — ABNORMAL LOW (ref 34.0–46.6)
Hemoglobin: 9 g/dL — ABNORMAL LOW (ref 11.1–15.9)
MCH: 25.2 pg — ABNORMAL LOW (ref 26.6–33.0)
MCHC: 29.7 g/dL — ABNORMAL LOW (ref 31.5–35.7)
MCV: 85 fL (ref 79–97)
Platelets: 165 10*3/uL (ref 150–450)
RBC: 3.57 x10E6/uL — ABNORMAL LOW (ref 3.77–5.28)
RDW: 15.5 % — ABNORMAL HIGH (ref 11.7–15.4)
WBC: 4.2 10*3/uL (ref 3.4–10.8)

## 2024-04-11 LAB — LIPID PANEL
Chol/HDL Ratio: 2.2 ratio (ref 0.0–4.4)
Cholesterol, Total: 130 mg/dL (ref 100–199)
HDL: 60 mg/dL (ref >39)
LDL Chol Calc (NIH): 53 mg/dL (ref 0–99)
Triglycerides: 86 mg/dL (ref 0–149)
VLDL Cholesterol Cal: 17 mg/dL (ref 5–40)

## 2024-04-11 LAB — LDL CHOLESTEROL, DIRECT: LDL Direct: 55 mg/dL (ref 0–99)

## 2024-04-19 ENCOUNTER — Inpatient Hospital Stay: Payer: 59 | Admitting: Oncology

## 2024-04-19 ENCOUNTER — Inpatient Hospital Stay: Payer: 59

## 2024-04-26 ENCOUNTER — Encounter: Payer: Self-pay | Admitting: Cardiology

## 2024-04-26 ENCOUNTER — Ambulatory Visit: Attending: Cardiology | Admitting: Cardiology

## 2024-04-26 VITALS — BP 154/72 | HR 76 | Ht 64.0 in | Wt 182.0 lb

## 2024-04-26 DIAGNOSIS — K7030 Alcoholic cirrhosis of liver without ascites: Secondary | ICD-10-CM

## 2024-04-26 DIAGNOSIS — I8500 Esophageal varices without bleeding: Secondary | ICD-10-CM

## 2024-04-26 DIAGNOSIS — D61818 Other pancytopenia: Secondary | ICD-10-CM | POA: Diagnosis not present

## 2024-04-26 DIAGNOSIS — I480 Paroxysmal atrial fibrillation: Secondary | ICD-10-CM | POA: Diagnosis not present

## 2024-04-26 NOTE — Progress Notes (Signed)
 Electrophysiology Office Note:    Date:  04/26/2024   ID:  Kathryn [name], DOB [birth year], MRN [id number]  [facility] Cardiologist:  Antionette Kirks, MD  [facility] Electrophysiologist:  Boyce Byes, MD   Referring MD: Roark Chick, PA-C   Chief Complaint: Atrial fibrillation  History of Present Illness:    Kathryn [name] is a 68 year old woman who I am seeing today for an evaluation of atrial fibrillation at the request of Varney Gentleman, PA-C.  The patient has a history of atrial fibrillation, alcoholism complicated by cirrhosis, portal hypertension with varices, hypertensive gastropathy post APC, hypertension, hyperlipidemia, aortic atherosclerosis, hypothyroidism and asthma.  The patient was last seen by [facility] Apr 10, 2024.  She was admitted to [facility] in March of this year with chest tightness and palpitations.  She was found to be in atrial fibrillation.  Her ethanol level was significantly elevated at that time.  At the appointment with Kathryn [name] she reported no further alcohol use.  She was tolerating Eliquis .  She is doing well today.  She is cut her drinking back significantly but is still drinking wine daily.  She knows she can stop drinking as she did previously for 2 years after her thyroid problem.  She has not had an episode of A-fib that she is aware of.  She was not completely aware of her first episode of atrial fibrillation when she was at the hospital with significantly elevated alcohol levels.  She has a prescription for naltrexone from her primary care physician to assist with her stopping drinking.  She is planning to restart that.  She is hesitant to consider invasive procedures.    Their past medical, social and family history was reviewed.   ROS:   Please see the history of present illness.    All other systems reviewed and are negative.  EKGs/Labs/Other Studies Reviewed:    The following studies were reviewed today:  February 20, 2024 echo EF 65-70 RV  normal Dilated left and right atrium Mild to moderate TR Moderate MR  Apr 10, 2024 EKG shows sinus rhythm.  No preexcitation.  Normal intervals.  QTc 450 ms.  February 18, 2024 EKG shows atrial fibrillation with a rapid ventricular rate.  The rate 153 bpm.       Physical Exam:    VS:  BP (!) 154/72   Pulse 76   Ht 5\' 4"  (1.626 m)   Wt 182 lb (82.6 kg)   SpO2 97%   BMI 31.24 kg/m     Wt Readings from Last 3 Encounters:  04/26/24 182 lb (82.6 kg)  04/10/24 188 lb 3.2 oz (85.4 kg)  02/20/24 186 lb 8.2 oz (84.6 kg)     GEN: no distress CARD: RRR, No MRG RESP: No IWOB. CTAB.        ASSESSMENT AND PLAN:    1. PAF (paroxysmal atrial fibrillation) (HCC)   2. Esophageal varices without bleeding, unspecified esophageal varices type (HCC)   3. Pancytopenia (HCC)   4. Alcoholic cirrhosis, unspecified whether ascites present [facility]     #Atrial fibrillation First diagnosed in March of this year when she presented to the hospital intoxicated.  She was started on medical therapy including Eliquis .  She is referred to discuss treatment options for the atrial fibrillation.    To my knowledge she is only had 1 episode of atrial fibrillation in the setting of alcohol intoxication.  I discussed her atrial fibrillation in detail today.  We discussed treatment strategies  including conservative therapy and ablation.  For now, I would like to make sure she is still having A-fib in the absence of alcohol.  We discussed monitoring for atrial fibrillation given her first episode was relatively asymptomatic.  I discussed loop recorder monitoring in detail the patient and she is interested in proceeding.  She understands the risks, the procedural details and the monthly monitoring cost.  If she has no atrial fibrillation for 6 months after the loop recorder is implanted, favor stopping anticoagulation given the risk/benefit balance.  If she were to have atrial fibrillation on loop recorder monitoring,  favor ablation/watchman.  HAS-BLED score 4 Hypertension Yes  Abnormal renal and liver function (Dialysis, transplant, Cr >2.26 mg/dL /Cirrhosis or Bilirubin >2x Normal or AST/ALT/AP >3x Normal) Yes  Stroke No  Bleeding Yes  Labile INR (Unstable/high INR) No  Elderly (>65) Yes  Drugs or alcohol (>= 8 drinks/week, anti-plt or NSAID) No   CHA2DS2-VASc Score = 4  The patient's score is based upon: CHF History: 0 HTN History: 1 Diabetes History: 0 Stroke History: 0 Vascular Disease History: 1 Age Score: 1 Gender Score: 1  Follow-up in the next 1 to 3 weeks for loop recorder implant.     Signed, Leanora Prophet. Marven Slimmer, MD, [facility], [facility] 04/26/2024 10:32 AM    Electrophysiology [facility]

## 2024-04-26 NOTE — Patient Instructions (Signed)
 Medication Instructions:  Your physician recommends that you continue on your current medications as directed. Please refer to the Current Medication list given to you today.  *If you need a refill on your cardiac medications before your next appointment, please call your pharmacy*  Testing/Procedures: Loop Recorder Implant - this will be done at your next office visit. There are no restrictions or special instructions prior to this visit. Please note that you will not be able to shower for 72 hours after the procedure.   Follow-Up: At [facility], you and your health needs are our priority.  As part of our continuing mission to provide you with exceptional heart care, our providers are all part of one team.  This team includes your primary Cardiologist (physician) and Advanced Practice Providers or APPs (Physician Assistants and Nurse Practitioners) who all work together to provide you with the care you need, when you need it.  Your next appointment:   Loop Implant  Provider:   Harvie Liner, MD

## 2024-05-01 ENCOUNTER — Encounter: Payer: Self-pay | Admitting: Family Medicine

## 2024-05-01 DIAGNOSIS — Z0000 Encounter for general adult medical examination without abnormal findings: Secondary | ICD-10-CM

## 2024-05-03 ENCOUNTER — Inpatient Hospital Stay

## 2024-05-03 ENCOUNTER — Inpatient Hospital Stay: Admitting: Oncology

## 2024-05-09 ENCOUNTER — Inpatient Hospital Stay: Admitting: Oncology

## 2024-05-09 ENCOUNTER — Inpatient Hospital Stay

## 2024-05-16 NOTE — Progress Notes (Unsigned)
 Electrophysiology Office Follow up Visit Note:    Date:  05/16/2024   ID:  Kathryn [name], DOB 1956/05/05, MRN [id number]  PCP:  Odell Chard, Edra GRADE, MD  [facility] Cardiologist:  Deatrice Cage, MD  [facility] Electrophysiologist:  OLE ONEIDA HOLTS, MD    Interval History:     Kathryn [name] is a 68 y.o. female who presents for a follow up visit.   I last saw her April 26, 2024.  Her A-fib was diagnosed in March when she presented to the hospital intoxicated.  She was started on Eliquis .  At the time of our last appointment it was felt that her A-fib was an isolated episode in the setting of alcohol intoxication.  We plan for loop recorder implant to monitor the burden of atrial fibrillation.  If she had no evidence of atrial fibrillation for 6 months on loop recorder monitoring, favor discontinuing Eliquis  to avoid bleeding risks associated with prolonged use.        Past medical, surgical, social and family history were reviewed.  ROS:   Please see the history of present illness.    All other systems reviewed and are negative.  EKGs/Labs/Other Studies Reviewed:    The following studies were reviewed today:          Physical Exam:    VS:  There were no vitals taken for this visit.    Wt Readings from Last 3 Encounters:  04/26/24 182 lb (82.6 kg)  04/10/24 188 lb 3.2 oz (85.4 kg)  02/20/24 186 lb 8.2 oz (84.6 kg)     GEN: no distress CARD: RRR, No MRG RESP: No IWOB. CTAB.      ASSESSMENT:    No diagnosis found. PLAN:    In order of problems listed above:  #  Atrial fibrillation Isolated episode in the setting of alcohol intoxication.  Plan for loop recorder implant today to monitor the burden of atrial fibrillation.  If no evidence of atrial fibrillation during 6 months of monitoring, she will stop anticoagulation.  She understands that there is some risk of stroke with this strategy but the risks of indefinite anticoagulation in the setting of  her alcohol use outweigh the potential benefits.  I discussed the loop recorder implant procedure in detail including the risks and monthly monitoring costs and she wishes to proceed.  Follow up 6 months.   Signed, OLE HOLTS, MD, [facility], [facility] 05/16/2024 9:11 PM    Electrophysiology  ---------------------  SURGEON:  OLE ONEIDA HOLTS, MD     PREPROCEDURE DIAGNOSIS:  Atrial fibrillation    POSTPROCEDURE DIAGNOSIS: Atrial fibrillation     PROCEDURES:   1. Implantable loop recorder implantation    INTRODUCTION:  Kathryn [name] presents with a history of atrial fibrillation The costs of loop recorder monitoring have been discussed with the patient.    DESCRIPTION OF PROCEDURE:  Informed written consent was obtained.  A preprocedural timeout was performed with the RN [place]). The patient required no sedation for the procedure today.  Mapping over the patient's chest was performed to identify the area where electrograms were most prominent for ILR recording.  This area was found to be the left parasternal region over the 4th intercostal space. The patients left chest was therefore prepped and draped in the usual sterile fashion. The skin overlying the left parasternal region was infiltrated with lidocaine  for local analgesia.  A 0.5-cm incision was made over the left parasternal region over the 3rd intercostal space.  A subcutaneous ILR pocket was fashioned using a combination of sharp and blunt dissection.  A Medtronic Reveal LINQ [id number]) implantable loop recorder was then placed into the pocket  R waves were very prominent and measured >0.14mV.  Steri- Strips and a sterile dressing were then applied.  There were no early apparent complications.     CONCLUSIONS:   1. Successful implantation of a implantable loop recorder for Atrial fibrillation  2. No early apparent complications.   Ole T. Cindie, MD, [facility], [place] [facility] Electrophys[facility]

## 2024-05-17 ENCOUNTER — Ambulatory Visit: Attending: Cardiology | Admitting: Cardiology

## 2024-05-17 VITALS — BP 154/82 | HR 57 | Ht 64.0 in | Wt 185.0 lb

## 2024-05-17 DIAGNOSIS — I480 Paroxysmal atrial fibrillation: Secondary | ICD-10-CM

## 2024-05-17 DIAGNOSIS — I4891 Unspecified atrial fibrillation: Secondary | ICD-10-CM | POA: Diagnosis not present

## 2024-05-17 NOTE — Patient Instructions (Signed)
Medication Instructions:  Your physician recommends that you continue on your current medications as directed. Please refer to the Current Medication list given to you today.  Labwork: None ordered.  Testing/Procedures: None ordered.  Follow-Up:  Your physician wants you to follow-up in: 6 months with EP APP.  You will receive a reminder letter in the mail two months in advance. If you don't receive a letter, please call our office to schedule the follow-up appointment.    Implantable Loop Recorder Placement, Care After This sheet gives you information about how to care for yourself after your procedure. Your health care provider may also give you more specific instructions. If you have problems or questions, contact your health care provider. What can I expect after the procedure? After the procedure, it is common to have: Soreness or discomfort near the incision. Some swelling or bruising near the incision.  Follow these instructions at home: Incision care  Monitor your cardiac device site for redness, swelling, and drainage. Call the device clinic at [phone] if you experience these symptoms or fever/chills.  Keep the large square bandage on your site for 24 hours and then you may remove it yourself. Keep the steri-strips underneath in place.   You may shower after 72 hours / 3 days from your procedure with the steri-strips in place. They will usually fall off on their own, or may be removed after 10 days. Pat dry.   Avoid lotions, ointments, or perfumes over your incision until it is well-healed.  Please do not submerge in water until your site is completely healed.   Your device is MRI compatible.   Remote monitoring is used to monitor your cardiac device from home. This monitoring is scheduled every month by our office. It allows [place] to keep an eye on the function of your device to ensure it is working properly.  If your wound site starts to bleed apply pressure.     For help with the monitor please call Medtronic Monitor Support Specialist directly at [phone].    If you have any questions/concerns please call the device clinic at [phone].  Activity  Return to your normal activities.  General instructions Follow instructions from your health care provider about how to manage your implantable loop recorder and transmit the information. Learn how to activate a recording if this is necessary for your type of device. You may go through a metal detection gate, and you may let someone hold a metal detector over your chest. Show your ID card if needed. Do not have an MRI unless you check with your health care provider first. Take over-the-counter and prescription medicines only as told by your health care provider. Keep all follow-up visits as told by your health care provider. This is important. Contact a health care provider if: You have redness, swelling, or pain around your incision. You have a fever. You have pain that is not relieved by your pain medicine. You have triggered your device because of fainting (syncope) or because of a heartbeat that feels like it is racing, slow, fluttering, or skipping (palpitations). Get help right away if you have: Chest pain. Difficulty breathing. Summary After the procedure, it is common to have soreness or discomfort near the incision. Change your dressing as told by your health care provider. Follow instructions from your health care provider about how to manage your implantable loop recorder and transmit the information. Keep all follow-up visits as told by your health care provider. This is important. This information  is not intended to replace advice given to you by your health care provider. Make sure you discuss any questions you have with your health care provider. Document Released: 10/21/2015 Document Revised: 12/25/2017 Document Reviewed: 12/25/2017 Elsevier Patient Education  2020 [employer].

## 2024-05-23 ENCOUNTER — Inpatient Hospital Stay

## 2024-05-23 ENCOUNTER — Inpatient Hospital Stay: Admitting: Oncology

## 2024-06-05 ENCOUNTER — Ambulatory Visit: Admission: RE | Admit: 2024-06-05 | Source: Ambulatory Visit

## 2024-06-07 ENCOUNTER — Emergency Department
Admission: EM | Admit: 2024-06-07 | Discharge: 2024-06-07 | Disposition: A | Discharge status: Home / Self Care | Attending: Emergency Medicine | Admitting: Emergency Medicine

## 2024-06-07 ENCOUNTER — Emergency Department

## 2024-06-07 ENCOUNTER — Other Ambulatory Visit: Payer: Self-pay

## 2024-06-07 DIAGNOSIS — E162 Hypoglycemia, unspecified: Secondary | ICD-10-CM | POA: Insufficient documentation

## 2024-06-07 DIAGNOSIS — R0789 Other chest pain: Secondary | ICD-10-CM | POA: Diagnosis present

## 2024-06-07 DIAGNOSIS — R079 Chest pain, unspecified: Secondary | ICD-10-CM

## 2024-06-07 DIAGNOSIS — Z7901 Long term (current) use of anticoagulants: Secondary | ICD-10-CM | POA: Diagnosis not present

## 2024-06-07 LAB — BASIC METABOLIC PANEL WITH GFR
Anion gap: 12 (ref 5–15)
BUN: 13 mg/dL (ref 8–23)
CO2: 23 mmol/L (ref 22–32)
Calcium: 9.3 mg/dL (ref 8.9–10.3)
Chloride: 104 mmol/L (ref 98–111)
Creatinine, Ser: 0.79 mg/dL (ref 0.44–1.00)
GFR, Estimated: >60 mL/min (ref >60)
Glucose, Bld: 88 mg/dL (ref 70–99)
Potassium: 4 mmol/L (ref 3.5–5.1)
Sodium: 139 mmol/L (ref 135–145)

## 2024-06-07 LAB — COMPREHENSIVE METABOLIC PANEL WITH GFR
ALT: 16 U/L (ref 0–44)
AST: 28 U/L (ref 15–41)
Albumin: 3.5 g/dL (ref 3.5–5.0)
Alkaline Phosphatase: 86 U/L (ref 38–126)
Anion gap: 17 — ABNORMAL HIGH (ref 5–15)
BUN: 24 mg/dL — ABNORMAL HIGH (ref 8–23)
CO2: 16 mmol/L — ABNORMAL LOW (ref 22–32)
Calcium: 8.4 mg/dL — ABNORMAL LOW (ref 8.9–10.3)
Chloride: 105 mmol/L (ref 98–111)
Creatinine, Ser: 1.25 mg/dL — ABNORMAL HIGH (ref 0.44–1.00)
GFR, Estimated: 47 mL/min — ABNORMAL LOW (ref >60)
Glucose, Bld: 55 mg/dL — ABNORMAL LOW (ref 70–99)
Potassium: 4.1 mmol/L (ref 3.5–5.1)
Sodium: 138 mmol/L (ref 135–145)
Total Bilirubin: 1.2 mg/dL (ref 0.0–1.2)
Total Protein: 8.1 g/dL (ref 6.5–8.1)

## 2024-06-07 LAB — URINALYSIS, W/ REFLEX TO CULTURE (INFECTION SUSPECTED)
Bacteria, UA: NONE SEEN
Bilirubin Urine: NEGATIVE
Glucose, UA: NEGATIVE mg/dL
Hgb urine dipstick: NEGATIVE
Ketones, ur: 20 mg/dL — AB
Leukocytes,Ua: NEGATIVE
Nitrite: NEGATIVE
Protein, ur: NEGATIVE mg/dL
Specific Gravity, Urine: 1.038 — ABNORMAL HIGH (ref 1.005–1.030)
pH: 5 (ref 5.0–8.0)

## 2024-06-07 LAB — CBC WITH DIFFERENTIAL/PLATELET
Abs Immature Granulocytes: 0.04 K/uL (ref 0.00–0.07)
Basophils Absolute: 0.1 K/uL (ref 0.0–0.1)
Basophils Relative: 1 %
Eosinophils Absolute: 0.1 K/uL (ref 0.0–0.5)
Eosinophils Relative: 1 %
HCT: 32.2 % — ABNORMAL LOW (ref 36.0–46.0)
Hemoglobin: 9.4 g/dL — ABNORMAL LOW (ref 12.0–15.0)
Immature Granulocytes: 1 %
Lymphocytes Relative: 23 %
Lymphs Abs: 1 K/uL (ref 0.7–4.0)
MCH: 23 pg — ABNORMAL LOW (ref 26.0–34.0)
MCHC: 29.2 g/dL — ABNORMAL LOW (ref 30.0–36.0)
MCV: 78.7 fL — ABNORMAL LOW (ref 80.0–100.0)
Monocytes Absolute: 0.4 K/uL (ref 0.1–1.0)
Monocytes Relative: 10 %
Neutro Abs: 2.7 K/uL (ref 1.7–7.7)
Neutrophils Relative %: 64 %
Platelets: 200 K/uL (ref 150–400)
RBC: 4.09 MIL/uL (ref 3.87–5.11)
RDW: 18.2 % — ABNORMAL HIGH (ref 11.5–15.5)
WBC: 4.3 K/uL (ref 4.0–10.5)
nRBC: 0.5 % — ABNORMAL HIGH (ref 0.0–0.2)

## 2024-06-07 LAB — PROTIME-INR
INR: 1.1 (ref 0.8–1.2)
Prothrombin Time: 15 s (ref 11.4–15.2)

## 2024-06-07 LAB — CBG MONITORING, ED
Glucose-Capillary: 47 mg/dL — ABNORMAL LOW (ref 70–99)
Glucose-Capillary: 215 mg/dL — ABNORMAL HIGH (ref 70–99)

## 2024-06-07 LAB — TROPONIN I (HIGH SENSITIVITY)
Troponin I (High Sensitivity): 20 ng/L — ABNORMAL HIGH (ref <18)
Troponin I (High Sensitivity): 21 ng/L — ABNORMAL HIGH (ref <18)

## 2024-06-07 MED ORDER — DEXTROSE 10 % IV BOLUS
250.0000 mL | Freq: Once | INTRAVENOUS | Status: AC
  Administered 2024-06-07: 250 mL via INTRAVENOUS

## 2024-06-07 MED ORDER — DEXTROSE 5 % IN LACTATED RINGERS IV BOLUS
1000.0000 mL | Freq: Once | INTRAVENOUS | Status: AC
  Administered 2024-06-07: 1000 mL via INTRAVENOUS

## 2024-06-07 MED ORDER — NITROGLYCERIN 0.4 MG SL SUBL
0.4000 mg | SUBLINGUAL_TABLET | Freq: Once | SUBLINGUAL | Status: AC
  Administered 2024-06-07: 0.4 mg via SUBLINGUAL
  Filled 2024-06-07: qty 1

## 2024-06-07 MED ORDER — IOHEXOL 350 MG/ML SOLN
75.0000 mL | Freq: Once | INTRAVENOUS | Status: AC | PRN
  Administered 2024-06-07: 75 mL via INTRAVENOUS

## 2024-06-07 NOTE — Discharge Instructions (Addendum)
 Please make sure to keep yourself hydrated and make sure that you are not missing any meals.  Please make sure to take your medications as prescribed.  Please make sure to follow-up with your primary care doctor early next week to get reassessed.

## 2024-06-07 NOTE — ED Notes (Signed)
 Called [facility] to add pt to their monitoring.

## 2024-06-07 NOTE — ED Triage Notes (Signed)
 Pt arrives via [facility] from home with c/o CP that started last night on the left side and was 10/10 and felt like heaviness. Today pain is 8/10. Pt states that pain starts in their back and radiates to their chest and is currently on the left side. Pt is A&Ox4 and ambulatory during triage. Pt received one nitroglycerin  spray and 4 baby aspirin  via EMS.

## 2024-06-07 NOTE — ED Provider Notes (Signed)
 [facility] Provider Note    Event Date/Time   First MD Initiated Contact with Patient 06/07/24 [phone]     (approximate)   History   Chest Pain   HPI  Kathryn [name] is a 68 y.o. female with history of paroxysmal A-fib on Eliquis , alcohol use disorder, cirrhosis, presenting with left-sided chest pain.  States that started last night.  Initially stated that it felt like pressure on the left side of her chest but when asked again, states that it radiates to her back and it feels like it is tearing.  Not associate any shortness of breath.  No palpitations, cough, urinary symptoms, nausea, vomiting, diarrhea, abdominal pain, fever.  States she has been compliant with medications but had not taken her Eliquis  this morning.  Has a loop recorder that was placed by cardiology.  Per independent history from EMS, she got a full dose aspirin , was given a spray of nitro with some improvement to the chest pain.  Independent history obtained from EMS as above.   On independent chart review, she was admitted for an NSTEMI in April of this year.  Had a left heart cath that showed mild to moderate nonobstructive coronary disease in the mid LAD and ostial left circumflex as well as 60 to 70% apical LAD.  Echo done at the time was 65 to 70%.  They suspected her chest pain at that time was to supply demand mismatch in setting of A-fib with RVR since left heart cath showed nonobstructive CAD.  Physical Exam   Triage Vital Signs: ED Triage Vitals  Encounter Vitals Group     BP      Girls Systolic BP Percentile      Girls Diastolic BP Percentile      Boys Systolic BP Percentile      Boys Diastolic BP Percentile      Pulse      Resp      Temp      Temp src      SpO2      Weight      Height      Head Circumference      Peak Flow      Pain Score      Pain Loc      Pain Education      Exclude from Growth Chart     Most recent vital signs: Vitals:   06/07/24 1130 06/07/24  1200  BP: (!) 155/79 (!) 165/75  Pulse: 77 73  Resp: 11 17  Temp:    SpO2: 100% 100%     General: Awake, no distress.  CV:  Good peripheral perfusion.  Resp:  Normal effort.  Clear Abd:  No distention.  Soft nontender Other:  Trace lower extremity edema, no unilateral calf swelling or tenderness, equal DP pulses bilaterally   ED Results / Procedures / Treatments   Labs (all labs ordered are listed, but only abnormal results are displayed) Labs Reviewed  COMPREHENSIVE METABOLIC PANEL WITH GFR - Abnormal; Notable for the following components:      Result Value   CO2 16 (*)    Glucose, Bld 55 (*)    BUN 24 (*)    Creatinine, Ser 1.25 (*)    Calcium  8.4 (*)    GFR, Estimated 47 (*)    Anion gap 17 (*)    All other components within normal limits  CBC WITH DIFFERENTIAL/PLATELET - Abnormal; Notable for the following components:   Hemoglobin  9.4 (*)    HCT 32.2 (*)    MCV 78.7 (*)    MCH 23.0 (*)    MCHC 29.2 (*)    RDW 18.2 (*)    nRBC 0.5 (*)    All other components within normal limits  URINALYSIS, W/ REFLEX TO CULTURE (INFECTION SUSPECTED) - Abnormal; Notable for the following components:   Color, Urine YELLOW (*)    APPearance CLEAR (*)    Specific Gravity, Urine 1.038 (*)    Ketones, ur 20 (*)    All other components within normal limits  CBG MONITORING, ED - Abnormal; Notable for the following components:   Glucose-Capillary 47 (*)    All other components within normal limits  CBG MONITORING, ED - Abnormal; Notable for the following components:   Glucose-Capillary 215 (*)    All other components within normal limits  TROPONIN I (HIGH SENSITIVITY) - Abnormal; Notable for the following components:   Troponin I (High Sensitivity) 20 (*)    All other components within normal limits  TROPONIN I (HIGH SENSITIVITY) - Abnormal; Notable for the following components:   Troponin I (High Sensitivity) 21 (*)    All other components within normal limits  PROTIME-INR   BASIC METABOLIC PANEL WITH GFR     EKG  EKG shows, sinus rhythm, rate 77, normal QRS, normal QTc, no obvious ischemic ST elevation, not significantly change compared to prior   RADIOLOGY On my independent interpretation, CT without obvious dissection   PROCEDURES:  Critical Care performed: No  Procedures   MEDICATIONS ORDERED IN ED: Medications  nitroGLYCERIN  (NITROSTAT ) SL tablet 0.4 mg (0.4 mg Sublingual Given 06/07/24 0943)  iohexol  (OMNIPAQUE ) 350 MG/ML injection 75 mL (75 mLs Intravenous Contrast Given 06/07/24 0957)  dextrose  (D10W) 10% bolus 250 mL (0 mLs Intravenous Stopped 06/07/24 1110)  dextrose  5% lactated ringers  bolus 1,000 mL (0 mLs Intravenous Stopped 06/07/24 1217)     IMPRESSION / MDM / ASSESSMENT AND PLAN / ED COURSE  I reviewed the triage vital signs and the nursing notes.                              Differential diagnosis includes, but is not limited to, angina, ACS, arrhythmia, electrolyte derangements, no infectious symptoms suggest viral illness or pneumonia, she denies any shortness of breath, no unilateral calf swelling or tenderness, No hemoptysis, no history of cancer or hormone use, considered but doubt PE, considered dissection given that she states the chest pain radiates to her back, will get a CT dissection protocol, labs, EKG, troponin, chest x-ray.  Patient's presentation is most consistent with acute presentation with potential threat to life or bodily function.  Independent interpretation of labs and imaging below.  EKG without inferior ischemia, will give her sublingual nitro here.  Will waive labs for CT dissection protocol, on review of prior labs, last creatinine is 1, EGFR 61.  On reassessment patient is feeling significantly better, discussed with patient and family about imaging and lab results, discussed with her about following up with her primary care doctor next week to get reassessed.  Emphasized hydration as well as not missing  any meals, and to take her medications as prescribed.  Shared decision making done with patient and daughter and they are agreeable to plan for discharge.  Considered but no indication for inpatient admission at this time, she is here for outpatient management.  Discharged with strict return precautions.  The patient is  on the cardiac monitor to evaluate for evidence of arrhythmia and/or significant heart rate changes.   Clinical Course as of 06/07/24 1321  Wed Jun 07, 2024  1003 DG Chest 2 View No active cardiopulmonary disease.  [TT]  1016 Independent review of labs, PT/INR normal, troponins mildly elevated, no leukocytosis, H&H stable, mild AKI, she is acidotic with anion gap, glucose is also low, suspect this could be starvation ketoacidosis, will give her a D10 bolus here as well as give her a bolus of D5 LR.  Will plan to repeat BMP. [TT]  1044 CT Angio Chest/Abd/Pel for Dissection W and/or W/WO IMPRESSION: 1. No acute aortic syndrome. No thoracic or abdominal aortic aneurysm, dissection, vasculitis or penetrating atherosclerotic ulcer. 2. No acute inflammatory process identified within the abdomen or pelvis. 3. There is mild-to-moderate circumferential thickening of the lower rectum, which may be accentuated by underdistention. Findings somewhat appear unchanged since the prior study. Correlate clinically to determine the need for further imaging with colonoscopy. 4. There is also mild to moderate irregular circumferential thickening of the descending colon, transverse colon and ascending colon. However, no significant pericolonic fat stranding. Findings may be accentuated by underdistention. Correlate clinically for subacute colitis. 5. Multiple other nonacute observations, as described above.  Aortic Atherosclerosis (ICD10-I70.0).   [TT]  1045 CT showed mild to moderate irregular circumferential thickening of the colon.  Patient has no abdominal pain, no GI symptoms at this  time.  Doubt colitis. [TT]  1315 Urinalysis, w/ Reflex to Culture (Infection Suspected) -Urine, Clean Catch(!) Not consistent with UTI [TT]  1315 Basic metabolic panel Creatinine is improving, no longer acidotic or with anion gap. [TT]  1315 Troponin x 2 stable. [TT]    Clinical Course User Index [TT] Waymond Lorelle Cummins, MD     FINAL CLINICAL IMPRESSION(S) / ED DIAGNOSES   Final diagnoses:  Chest pain, unspecified type  Hypoglycemia     Rx / DC Orders   ED Discharge Orders     None        Note:  This document was prepared using Dragon voice recognition software and may include unintentional dictation errors.Waymond Lorelle Cummins, MD 06/07/24 [phone]

## 2024-06-09 ENCOUNTER — Inpatient Hospital Stay: Admitting: Oncology

## 2024-06-09 ENCOUNTER — Encounter: Payer: Self-pay | Admitting: Oncology

## 2024-06-09 ENCOUNTER — Inpatient Hospital Stay: Attending: Family Medicine

## 2024-06-17 ENCOUNTER — Ambulatory Visit (INDEPENDENT_AMBULATORY_CARE_PROVIDER_SITE_OTHER)

## 2024-06-17 DIAGNOSIS — I480 Paroxysmal atrial fibrillation: Secondary | ICD-10-CM

## 2024-06-19 LAB — CUP PACEART REMOTE DEVICE CHECK
Date Time Interrogation Session: 20250726145712
Implantable Pulse Generator Implant Date: 20250624

## 2024-06-22 ENCOUNTER — Ambulatory Visit: Payer: Self-pay | Admitting: Cardiology

## 2024-07-10 NOTE — Progress Notes (Deleted)
 Cardiology Office Note    Date:  07/10/2024   ID:  Kathryn [name], DOB 10-28-1956, MRN [id number]  PCP:  Odell Chard, Edra GRADE, MD  Cardiologist:  Deatrice Cage, MD  Electrophysiologist:  OLE ONEIDA HOLTS, MD   Chief Complaint: Follow-up  History of Present Illness:   Kathryn [name] is a 68 y.o. 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  Echo in 06/2021 showed an EF of 60 to 65%, no regional wall motion abnormalities, moderate LVH, grade 1 diastolic dysfunction, normal RV systolic function and ventricular cavity size, moderately dilated left atrium, and mild to moderate mitral regurgitation.  Echo in 12/2021 showed an EF of 65 to 70%, mild intracavitary gradient with no significant change with Valsalva, moderate asymmetric LVH of the basal septal segment, trivial mitral regurgitation, mild aortic insufficiency with tricuspid aortic valve, normal RVSP, and aortic atherosclerosis.   She was admitted to [facility] in 01/2024 with chest tightness, shortness of breath, and palpitations.  She was found to be in A-fib with RVR.  She spontaneously converted to sinus rhythm.  Ethanol level elevated at 219.  High-sensitivity troponin trended to 3476.  BNP 278.  Echo showed an EF of 65 to 70%, no regional wall motion abnormalities, severe asymmetric LVH of the basal-septal segment, normal RV systolic function and ventricular cavity size, moderately elevated RVSP estimated at 49.8 mmHg, moderately dilated left atrium, mildly dilated right atrium, moderate mitral regurgitation, mild to moderate tricuspid regurgitation, aortic valve sclerosis without evidence of stenosis, and an estimated right atrial  pressure of 3 mmHg.  LHC on 02/21/2024 showed mild to moderate nonobstructive CAD including 20 to 30% mid LAD and ostial LCx stenoses as well as 60 to 70% apical LAD stenosis and a mildly elevated LVEDP estimated at 21 mmHg.  Prior to discharge she reported her bleeding symptoms were well-controlled on Protonix .  HAS-BLED score of 4.  She was interested in moving forward with anticoagulation and was started on apixaban  with recommendation to follow-up with EP as an outpatient to determine if she would be a candidate for left atrial appendage occlusion.  She was last seen in the office on 04/10/2024 and was without symptoms of angina or cardiac decompensation.  She was tolerating anticoagulation.  Given her history of pulmonary sarcoidosis it was recommended she undergo cardiac MRI, which remains pending at this time.  She followed up with EP on 05/17/2024 and underwent loop recorder implantation for evaluation of A-fib burden given significant comorbidities.  EP is recommended if no evidence of A-fib during 29-month monitoring, discontinue anticoagulation.  She was seen in the ED on 06/07/2024 with left-sided chest discomfort radiating to her back.  CTA of the chest, abdomen, and pelvis was without evidence of acute aortic syndrome,, without evidence of acute inflammatory process in the abdomen or pelvis, and multiple incidental findings as noted in the CT report.  Initial high-sensitivity troponin 20 with a delta troponin of 21.  EKG showed sinus rhythm without acute ST-T changes.  ***   Labs independently reviewed: 05/2024 - potassium 4.0, BUN 13, serum creatinine 0.79, Hgb 9.4, PLT 200, albumin 3.5, AST/ALT normal 03/2024 - direct LDL 55, TC 130, TG 86, HDL 60 01/2024 - LP(a) 43.4, TSH 9.815, free T4 low at  0.51 07/2023 - A1c 5.6   Past Medical History:  Diagnosis Date   Alcoholic cirrhosis of liver (HCC)    Asthma    CHF (congestive heart failure) (HCC)    Chronic disease anemia    Esophageal  varices (HCC)    GR I on EGD by Dr Jeri 09/2014   ETOH abuse    Hypertension    Hypothyroidism    Murmur    Portal hypertension (HCC)    Sarcoidosis     Past Surgical History:  Procedure Laterality Date   ESOPHAGOGASTRODUODENOSCOPY N/A 06/26/2021   Procedure: ESOPHAGOGASTRODUODENOSCOPY (EGD);  Surgeon: Toledo, Ladell POUR, MD;  Location: [facility] ENDOSCOPY;  Service: Gastroenterology;  Laterality: N/A;   ESOPHAGOGASTRODUODENOSCOPY (EGD) WITH PROPOFOL  N/A 04/04/2015   Procedure: ESOPHAGOGASTRODUODENOSCOPY (EGD) WITH PROPOFOL ;  Surgeon: Rogelia Copping, MD;  Location: [facility] ENDOSCOPY;  Service: Endoscopy;  Laterality: N/A;  Dr COPPING prefers 1445   ESOPHAGOGASTRODUODENOSCOPY (EGD) WITH PROPOFOL  N/A 12/04/2022   Procedure: ESOPHAGOGASTRODUODENOSCOPY (EGD) WITH PROPOFOL ;  Surgeon: Therisa Bi, MD;  Location: [facility] ENDOSCOPY;  Service: Gastroenterology;  Laterality: N/A;   ESOPHAGOGASTRODUODENOSCOPY W/ BANDING  09/2014   [place]in   LEFT HEART CATH AND CORONARY ANGIOGRAPHY N/A 02/21/2024   Procedure: LEFT HEART CATH AND CORONARY ANGIOGRAPHY;  Surgeon: Mady Bruckner, MD;  Location: [facility] INVASIVE CV LAB;  Service: Cardiovascular;  Laterality: N/A;   TUBAL LIGATION      Current Medications: No outpatient medications have been marked as taking for the 07/13/24 encounter (Appointment) with Abigail Bernardino HERO, PA-C.    Allergies:   Oxycodone -acetaminophen , Hydrocodone-acetaminophen , Ibuprofen, Naproxen, Propoxyphene, Shellfish allergy, and Tamiflu  [oseltamivir phosphate]   Social History   Socioeconomic History   Marital status: Divorced    Spouse name: Not on file   Number of children: Not on file   Years of education: Not on file   Highest education level: Not on file  Occupational History   Not on file  Tobacco Use   Smoking status: Never   Smokeless tobacco: Never  Vaping Use   Vaping status: Never Used  Substance and Sexual Activity   Alcohol use: Yes    Alcohol/week: 15.0 standard drinks of alcohol     Types: 15 Shots of liquor per week   Drug use: No   Sexual activity: Not Currently  Other Topics Concern   Not on file  Social History Narrative   She lives with 2 grandsons   Social Drivers of Corporate investment banker Strain: Not on file  Food Insecurity: No Food Insecurity (02/18/2024)   Hunger Vital Sign    Worried About Running Out of Food in the Last Year: Never true    Ran Out of Food in the Last Year: Never true  Recent Concern: Food Insecurity - Food Insecurity Present (12/08/2023)   Hunger Vital Sign    Worried About Running Out of Food in the Last Year: Sometimes true    Ran Out of Food in the Last Year: Sometimes true  Transportation Needs: No Transportation Needs (02/18/2024)   PRAPARE - Administrator, Civil Service (Medical): No    Lack of Transportation (Non-Medical): No  Physical Activity: Not on file  Stress: Not on file  Social Connections: Unknown (02/18/2024)   Social Connection and Isolation Panel    Frequency of Communication with Friends and Family: Twice a week    Frequency of Social Gatherings with Friends and Family: Twice a week    Attends Religious Services: Patient declined    Active  Member of Clubs or Organizations: Patient declined    Attends Banker Meetings: Never    Marital Status: Divorced     Family History:  The patient's family history includes Aneurysm in her father; COPD in her father; Heart attack in her mother.  ROS:   12-point review of systems is negative unless otherwise noted in the HPI.   EKGs/Labs/Other Studies Reviewed:    Studies reviewed were summarized above. The additional studies were reviewed today:  LHC 02/21/2024: Conclusions: Mild to moderate, nonobstructive coronary artery disease including 20-30% mid LAD and ostial LCx stenoses as well as 60-70% apical LAD lesion.  Findings suggestive of supply-demand mismatch. Mildly elevated left ventricular filling pressure (LVEDP 21 mmHg).    Recommendations: Escalate medical therapy and risk factor modification to prevent progression of CAD.  Will add low-dose statin therapy in the setting of chronic liver disease to target LDL less than 70. Maintain net even to slightly negative fluid balance in the setting of mildly elevated LVEDP suggestive of diastolic dysfunction. Resume heparin  infusion 2 hours after TR band removal.  Consider transitioning to DOAC as soon as tomorrow if no evidence of bleeding or vascular injury. __________   2D echo 02/20/2024: 1. Left ventricular ejection fraction, by estimation, is 65 to 70%. The  left ventricle has normal function. The left ventricle has no regional  wall motion abnormalities. There is severe asymmetric left ventricular  hypertrophy of the basal-septal  segment. Left ventricular diastolic parameters are indeterminate.   2. Right ventricular systolic function is normal. The right ventricular  size is normal. There is moderately elevated pulmonary artery systolic  pressure. The estimated right ventricular systolic pressure is 49.8 mmHg.   3. Left atrial size was moderately dilated.   4. Right atrial size was mildly dilated.   5. The mitral valve is abnormal. Moderate mitral valve regurgitation. No  evidence of mitral stenosis.   6. Tricuspid valve regurgitation is mild to moderate.   7. The aortic valve is tricuspid. There is mild calcification of the  aortic valve. There is mild thickening of the aortic valve. Aortic valve  regurgitation is not visualized. Aortic valve sclerosis is present, with  no evidence of aortic valve stenosis.   8. The inferior vena cava is normal in size with greater than 50%  respiratory variability, suggesting right atrial pressure of 3 mmHg.   Comparison(s): Prior images reviewed side by side. Changes from prior  study are noted. MR and TR more significant compared to prior, elevated  RVSP.  __________   Limited echo 01/04/2022: 1. Mild intracavitary  gradient with no significant change with Valsalva.  Peak velocity 1.2 m/s. Peak gradient 5 mmHg. Left ventricular ejection  fraction, by estimation, is 65 to 70%. The left ventricle has normal  function. There is moderate asymmetric  left ventricular hypertrophy of the basal-septal segment. Left ventricular  diastolic parameters were normal.   2. Trivial mitral valve regurgitation.   3. The aortic valve is tricuspid. Aortic valve regurgitation is mild.   4. There is Moderate (Grade III) atheroma plaque involving the ascending  aorta.   5. There is normal pulmonary artery systolic pressure.  __________   2D echo 06/25/2021: 1. Left ventricular ejection fraction, by estimation, is 60 to 65%. The  left ventricle has normal function. The left ventricle has no regional  wall motion abnormalities. There is moderate left ventricular hypertrophy.  Left ventricular diastolic  parameters are consistent with Grade I diastolic dysfunction (impaired  relaxation).  2. Right ventricular systolic function is normal. The right ventricular  size is normal.   3. Left atrial size was moderately dilated.   4. The mitral valve is normal in structure. Mild to moderate mitral valve  regurgitation.   EKG:  EKG is ordered today.  The EKG ordered today demonstrates ***  Recent Labs: 02/17/2024: B Natriuretic Peptide 278.3; TSH 9.815 02/19/2024: Magnesium  1.7 06/07/2024: ALT 16; BUN 13; Creatinine, Ser 0.79; Hemoglobin 9.4; Platelets 200; Potassium 4.0; Sodium 139  Recent Lipid Panel    Component Value Date/Time   CHOL 130 04/10/2024 1157   TRIG 86 04/10/2024 1157   HDL 60 04/10/2024 1157   CHOLHDL 2.2 04/10/2024 1157   CHOLHDL 2.1 06/25/2021 0339   VLDL 13 06/25/2021 0339   LDLCALC 53 04/10/2024 1157   LDLDIRECT 55 04/10/2024 1157    PHYSICAL EXAM:    VS:  There were no vitals taken for this visit.  BMI: There is no height or weight on file to calculate BMI.  Physical Exam  Wt Readings from Last  3 Encounters:  06/07/24 184 lb 1.6 oz (83.5 kg)  05/17/24 185 lb (83.9 kg)  04/26/24 182 lb (82.6 kg)     ASSESSMENT & PLAN:   Nonobstructive CAD/HLD:  PAF: ***.  CHA2DS2-VASc at least 4.  HAS-BLED ***.  Sarcoidosis:  Diastolic dysfunction:  HTN: Blood pressure  Alcoholic cirrhosis with esophageal varices and severe portal hypertensive gastropathy status post APC complicated by pancytopenia:   {Are you ordering a CV Procedure (e.g. stress test, cath, DCCV, TEE, etc)?   Press F2        :[id number]}     Disposition: F/u with Dr. Darron or an APP in ***, and EP as directed.    Medication Adjustments/Labs and Tests Ordered: Current medicines are reviewed at length with the patient today.  Concerns regarding medicines are outlined above. Medication changes, Labs and Tests ordered today are summarized above and listed in the Patient Instructions accessible in Encounters.   Signed, Bernardino Bring, PA-C 07/10/2024 8:58 AM     [facility] [place] [address] [place], [place] [address] [phone]

## 2024-07-13 ENCOUNTER — Ambulatory Visit: Attending: Physician Assistant | Admitting: Physician Assistant

## 2024-07-13 DIAGNOSIS — I2510 Atherosclerotic heart disease of native coronary artery without angina pectoris: Secondary | ICD-10-CM

## 2024-07-13 DIAGNOSIS — D61818 Other pancytopenia: Secondary | ICD-10-CM

## 2024-07-13 DIAGNOSIS — I5189 Other ill-defined heart diseases: Secondary | ICD-10-CM

## 2024-07-13 DIAGNOSIS — I480 Paroxysmal atrial fibrillation: Secondary | ICD-10-CM

## 2024-07-13 DIAGNOSIS — D869 Sarcoidosis, unspecified: Secondary | ICD-10-CM

## 2024-07-13 DIAGNOSIS — I8500 Esophageal varices without bleeding: Secondary | ICD-10-CM

## 2024-07-13 DIAGNOSIS — I10 Essential (primary) hypertension: Secondary | ICD-10-CM

## 2024-07-13 DIAGNOSIS — K7030 Alcoholic cirrhosis of liver without ascites: Secondary | ICD-10-CM

## 2024-07-18 ENCOUNTER — Ambulatory Visit (INDEPENDENT_AMBULATORY_CARE_PROVIDER_SITE_OTHER)

## 2024-07-18 DIAGNOSIS — I480 Paroxysmal atrial fibrillation: Secondary | ICD-10-CM

## 2024-07-21 ENCOUNTER — Telehealth: Payer: Self-pay | Admitting: *Deleted

## 2024-07-21 LAB — CUP PACEART REMOTE DEVICE CHECK
Date Time Interrogation Session: 20250826150740
Implantable Pulse Generator Implant Date: 20250624

## 2024-07-21 NOTE — Telephone Encounter (Signed)
 ILR summary report received. Battery status OK. Normal device function. No new symptom, tachy, brady, or pause episodes. 267 new AF episodes, longest 6 hrs 40 min on 07/04/24 at 16:06, V rates > 100 bpm ~ 40% during AT/AF, Burden 12.4%, presenting EGM c/w an ongoing atrial arrhythmia, Med list includes Eliquis ; routed to clinic for review. Monthly summary reports and ROV/PRN. MC, CVRS ______________________________________________________________________________  AF burden on 7/26 remote was 0%, and it is now 12.4%  Called patient to notify them they went back into AF on 07/04/24  Patient stated they have been more tired and fatigued lately but denies any dizziness or palpitations  Patient confirmed they are taking all of their prescribed medications   Will route to [place] for review and awareness  Patient usually seen in [place] office and says she has no way to get to [facility]

## 2024-07-25 ENCOUNTER — Ambulatory Visit: Payer: Self-pay | Admitting: Cardiology

## 2024-07-25 NOTE — Telephone Encounter (Signed)
 Spoke with the patient and scheduled her for an appointment on 9/4 with EP APP.

## 2024-07-26 NOTE — Progress Notes (Deleted)
 Electrophysiology Clinic Note    Date:  07/26/2024  Patient ID:  Kathryn [name], Ferch 1956-07-26, MRN [id number] PCP:  Odell Chard, Edra GRADE, MD  Cardiologist:  Deatrice Cage, MD   Electrophysiologist:  OLE ONEIDA HOLTS, MD    ***refresh  Discussed the use of AI scribe software for clinical note transcription with the patient, who gave verbal consent to proceed.   Patient Profile    Chief Complaint: ***  History of Present Illness: Kathryn [name] is a 67 y.o. female with PMH notable for parox AFib, ETOH abuse, cirrhosis, portal HTN w varices, HTN, HLD, hypothyroid; seen today for OLE ONEIDA HOLTS, MD for acute visit due to AFib.    Afib was initially diagnosed in 01/2024 after presenting to [facility] with c/o chest tightness and palpitations. Ethanol was significantly elevated, she was started on eliquis . She saw Dr. HOLTS 04/2024 where she did not endorse any further AFib. She thought AFib was only iso ETOH consumption, so discussed ILR implant to further eval.   Device clinic was alerted last week to increased AFib burden.   On follow-up today, *** AF burden, symptoms *** palpitations *** bleeding concerns  - AAD - multaq?, tikosyn   Patient reports ***.  she denies chest pain, palpitations, dyspnea, PND, orthopnea, nausea, vomiting, dizziness, syncope, edema, weight gain, or early satiety.      Arrhythmia/Device History MDT ILR, imp 04/2024; dx AFib    ROS:  Please see the history of present illness. All other systems are reviewed and otherwise negative.    Physical Exam    VS:  There were no vitals taken for this visit. BMI: There is no height or weight on file to calculate BMI.      Wt Readings from Last 3 Encounters:  06/07/24 184 lb 1.6 oz (83.5 kg)  05/17/24 185 lb (83.9 kg)  04/26/24 182 lb (82.6 kg)     GEN- The patient is well appearing, alert and oriented x 3 today.   Lungs- Clear to ausculation bilaterally, normal work of breathing.  Heart-  {Blank single:19197::Regular,Irregularly irregular} rate and rhythm, no murmurs, rubs or gallops Extremities- {EDEMA LEVEL:28147::No} peripheral edema, warm, dry Skin-  *** device pocket well-healed, no tethering   Device interrogation done today and reviewed by myself:  Battery *** Lead thresholds, impedence, sensing stable *** *** episodes *** changes made today   Studies Reviewed   Previous EP, cardiology notes.    EKG is ordered. Personal review of EKG from today shows:  ***        LHC, 02/21/2024 Mild to moderate, nonobstructive coronary artery disease including 20-30% mid LAD and ostial LCx stenoses as well as 60-70% apical LAD lesion.  Findings suggestive of supply-demand mismatch. Mildly elevated left ventricular filling pressure (LVEDP 21 mmHg).  TTE, 02/21/2024 1. Left ventricular ejection fraction, by estimation, is 65 to 70%. The  left ventricle has normal function. The left ventricle has no regional  wall motion abnormalities. There is severe asymmetric left ventricular hypertrophy of the basal-septal segment. Left ventricular diastolic parameters are indeterminate.   2. Right ventricular systolic function is normal. The right ventricular  size is normal. There is moderately elevated pulmonary artery systolic pressure. The estimated right ventricular systolic pressure is 49.8 mmHg.   3. Left atrial size was moderately dilated.   4. Right atrial size was mildly dilated.   5. The mitral valve is abnormal. Moderate mitral valve regurgitation. No evidence of mitral stenosis.   6. Tricuspid valve  regurgitation is mild to moderate.   7. The aortic valve is tricuspid. There is mild calcification of the  aortic valve. There is mild thickening of the aortic valve. Aortic valve  regurgitation is not visualized. Aortic valve sclerosis is present, with  no evidence of aortic valve stenosis.   8. The inferior vena cava is normal in size with greater than 50%  respiratory  variability, suggesting right atrial pressure of 3 mmHg.   Comparison(s): Prior images reviewed side by side. Changes from prior study are noted. MR and TR more significant compared to prior, elevated RVSP.   Limited TTE, 01/04/2022 1. Mild intracavitary gradient with no significant change with Valsalva. Peak velocity 1.2 m/s. Peak gradient 5 mmHg. Left ventricular ejection fraction, by estimation, is 65 to 70%. The left ventricle has normal function. There is moderate asymmetric left ventricular hypertrophy of the basal-septal segment. Left ventricular  diastolic parameters were normal.   2. Trivial mitral valve regurgitation.   3. The aortic valve is tricuspid. Aortic valve regurgitation is mild.   4. There is Moderate (Grade III) atheroma plaque involving the ascending aorta.   5. There is normal pulmonary artery systolic pressure.    Assessment and Plan     #) parox Afib   #) ***   {Are you ordering a CV Procedure (e.g. stress test, cath, DCCV, TEE, etc)?   Press F2        :[id number]}   Current medicines are reviewed at length with the patient today.   The patient {ACTIONS; HAS/DOES NOT HAVE:19233} concerns regarding her medicines.  The following changes were made today:  {NONE DEFAULTED:18576}  Labs/ tests ordered today include: *** No orders of the defined types were placed in this encounter.    Disposition: Follow up with {EPMDS:28135::EP Team} or EP APP {EPFOLLOW UP:28173}   Signed, Chantal Needle, NP  07/26/24  8:19 PM  Electrophysiology [facility]

## 2024-07-27 ENCOUNTER — Emergency Department

## 2024-07-27 ENCOUNTER — Ambulatory Visit: Admitting: Cardiology

## 2024-07-27 ENCOUNTER — Other Ambulatory Visit: Payer: Self-pay

## 2024-07-27 ENCOUNTER — Encounter: Payer: Self-pay | Admitting: Intensive Care

## 2024-07-27 ENCOUNTER — Inpatient Hospital Stay
Admission: EM | Admit: 2024-07-27 | Discharge: 2024-08-02 | DRG: 441 | Disposition: A | Discharge status: Home Health | Attending: Internal Medicine | Admitting: Internal Medicine

## 2024-07-27 DIAGNOSIS — R040 Epistaxis: Secondary | ICD-10-CM | POA: Diagnosis present

## 2024-07-27 DIAGNOSIS — Z8616 Personal history of COVID-19: Secondary | ICD-10-CM | POA: Diagnosis not present

## 2024-07-27 DIAGNOSIS — Z6831 Body mass index (BMI) 31.0-31.9, adult: Secondary | ICD-10-CM

## 2024-07-27 DIAGNOSIS — E872 Acidosis, unspecified: Secondary | ICD-10-CM | POA: Diagnosis present

## 2024-07-27 DIAGNOSIS — R5381 Other malaise: Secondary | ICD-10-CM | POA: Diagnosis present

## 2024-07-27 DIAGNOSIS — Z91013 Allergy to seafood: Secondary | ICD-10-CM

## 2024-07-27 DIAGNOSIS — I81 Portal vein thrombosis: Secondary | ICD-10-CM | POA: Diagnosis present

## 2024-07-27 DIAGNOSIS — I480 Paroxysmal atrial fibrillation: Secondary | ICD-10-CM | POA: Diagnosis present

## 2024-07-27 DIAGNOSIS — R042 Hemoptysis: Secondary | ICD-10-CM | POA: Diagnosis present

## 2024-07-27 DIAGNOSIS — T80818A Extravasation of other vesicant agent, initial encounter: Secondary | ICD-10-CM | POA: Diagnosis present

## 2024-07-27 DIAGNOSIS — F1020 Alcohol dependence, uncomplicated: Secondary | ICD-10-CM | POA: Diagnosis present

## 2024-07-27 DIAGNOSIS — E669 Obesity, unspecified: Secondary | ICD-10-CM | POA: Diagnosis present

## 2024-07-27 DIAGNOSIS — I509 Heart failure, unspecified: Secondary | ICD-10-CM | POA: Diagnosis present

## 2024-07-27 DIAGNOSIS — I8511 Secondary esophageal varices with bleeding: Principal | ICD-10-CM | POA: Diagnosis present

## 2024-07-27 DIAGNOSIS — I8500 Esophageal varices without bleeding: Secondary | ICD-10-CM | POA: Diagnosis present

## 2024-07-27 DIAGNOSIS — R0789 Other chest pain: Secondary | ICD-10-CM | POA: Diagnosis present

## 2024-07-27 DIAGNOSIS — K642 Third degree hemorrhoids: Secondary | ICD-10-CM | POA: Diagnosis present

## 2024-07-27 DIAGNOSIS — Z1152 Encounter for screening for COVID-19: Secondary | ICD-10-CM | POA: Diagnosis not present

## 2024-07-27 DIAGNOSIS — I252 Old myocardial infarction: Secondary | ICD-10-CM

## 2024-07-27 DIAGNOSIS — K5900 Constipation, unspecified: Secondary | ICD-10-CM | POA: Diagnosis present

## 2024-07-27 DIAGNOSIS — K922 Gastrointestinal hemorrhage, unspecified: Secondary | ICD-10-CM | POA: Diagnosis not present

## 2024-07-27 DIAGNOSIS — K766 Portal hypertension: Principal | ICD-10-CM | POA: Diagnosis present

## 2024-07-27 DIAGNOSIS — D869 Sarcoidosis, unspecified: Secondary | ICD-10-CM | POA: Diagnosis present

## 2024-07-27 DIAGNOSIS — Z9889 Other specified postprocedural states: Secondary | ICD-10-CM

## 2024-07-27 DIAGNOSIS — K7030 Alcoholic cirrhosis of liver without ascites: Secondary | ICD-10-CM | POA: Diagnosis present

## 2024-07-27 DIAGNOSIS — Z9851 Tubal ligation status: Secondary | ICD-10-CM

## 2024-07-27 DIAGNOSIS — I2489 Other forms of acute ischemic heart disease: Secondary | ICD-10-CM | POA: Diagnosis present

## 2024-07-27 DIAGNOSIS — E785 Hyperlipidemia, unspecified: Secondary | ICD-10-CM | POA: Diagnosis present

## 2024-07-27 DIAGNOSIS — J4489 Other specified chronic obstructive pulmonary disease: Secondary | ICD-10-CM | POA: Diagnosis present

## 2024-07-27 DIAGNOSIS — Z604 Social exclusion and rejection: Secondary | ICD-10-CM | POA: Diagnosis present

## 2024-07-27 DIAGNOSIS — D509 Iron deficiency anemia, unspecified: Secondary | ICD-10-CM | POA: Diagnosis present

## 2024-07-27 DIAGNOSIS — Y842 Radiological procedure and radiotherapy as the cause of abnormal reaction of the patient, or of later complication, without mention of misadventure at the time of the procedure: Secondary | ICD-10-CM | POA: Diagnosis present

## 2024-07-27 DIAGNOSIS — E039 Hypothyroidism, unspecified: Secondary | ICD-10-CM | POA: Diagnosis present

## 2024-07-27 DIAGNOSIS — Y92238 Other place in hospital as the place of occurrence of the external cause: Secondary | ICD-10-CM | POA: Diagnosis present

## 2024-07-27 DIAGNOSIS — K8020 Calculus of gallbladder without cholecystitis without obstruction: Secondary | ICD-10-CM | POA: Diagnosis present

## 2024-07-27 DIAGNOSIS — I700 Atherosclerosis of aorta: Secondary | ICD-10-CM | POA: Diagnosis present

## 2024-07-27 DIAGNOSIS — K449 Diaphragmatic hernia without obstruction or gangrene: Secondary | ICD-10-CM | POA: Diagnosis present

## 2024-07-27 DIAGNOSIS — E871 Hypo-osmolality and hyponatremia: Secondary | ICD-10-CM | POA: Diagnosis not present

## 2024-07-27 DIAGNOSIS — I10 Essential (primary) hypertension: Secondary | ICD-10-CM | POA: Diagnosis present

## 2024-07-27 DIAGNOSIS — K7460 Unspecified cirrhosis of liver: Secondary | ICD-10-CM | POA: Diagnosis present

## 2024-07-27 DIAGNOSIS — K625 Hemorrhage of anus and rectum: Secondary | ICD-10-CM | POA: Diagnosis present

## 2024-07-27 DIAGNOSIS — D649 Anemia, unspecified: Secondary | ICD-10-CM

## 2024-07-27 DIAGNOSIS — D696 Thrombocytopenia, unspecified: Secondary | ICD-10-CM | POA: Diagnosis present

## 2024-07-27 DIAGNOSIS — F1090 Alcohol use, unspecified, uncomplicated: Secondary | ICD-10-CM | POA: Diagnosis present

## 2024-07-27 DIAGNOSIS — K644 Residual hemorrhoidal skin tags: Secondary | ICD-10-CM | POA: Diagnosis present

## 2024-07-27 DIAGNOSIS — D638 Anemia in other chronic diseases classified elsewhere: Secondary | ICD-10-CM | POA: Diagnosis present

## 2024-07-27 DIAGNOSIS — K760 Fatty (change of) liver, not elsewhere classified: Secondary | ICD-10-CM | POA: Diagnosis present

## 2024-07-27 DIAGNOSIS — Z79899 Other long term (current) drug therapy: Secondary | ICD-10-CM

## 2024-07-27 DIAGNOSIS — K6389 Other specified diseases of intestine: Secondary | ICD-10-CM | POA: Diagnosis present

## 2024-07-27 DIAGNOSIS — Z7141 Alcohol abuse counseling and surveillance of alcoholic: Secondary | ICD-10-CM

## 2024-07-27 DIAGNOSIS — R195 Other fecal abnormalities: Secondary | ICD-10-CM | POA: Diagnosis present

## 2024-07-27 DIAGNOSIS — T454X5A Adverse effect of iron and its compounds, initial encounter: Secondary | ICD-10-CM | POA: Diagnosis not present

## 2024-07-27 DIAGNOSIS — Z8249 Family history of ischemic heart disease and other diseases of the circulatory system: Secondary | ICD-10-CM

## 2024-07-27 DIAGNOSIS — Z885 Allergy status to narcotic agent status: Secondary | ICD-10-CM

## 2024-07-27 DIAGNOSIS — I130 Hypertensive heart and chronic kidney disease with heart failure and stage 1 through stage 4 chronic kidney disease, or unspecified chronic kidney disease: Secondary | ICD-10-CM | POA: Diagnosis present

## 2024-07-27 DIAGNOSIS — I2510 Atherosclerotic heart disease of native coronary artery without angina pectoris: Secondary | ICD-10-CM | POA: Diagnosis present

## 2024-07-27 DIAGNOSIS — Z886 Allergy status to analgesic agent status: Secondary | ICD-10-CM

## 2024-07-27 DIAGNOSIS — Z7901 Long term (current) use of anticoagulants: Secondary | ICD-10-CM

## 2024-07-27 DIAGNOSIS — Z8701 Personal history of pneumonia (recurrent): Secondary | ICD-10-CM

## 2024-07-27 DIAGNOSIS — Z825 Family history of asthma and other chronic lower respiratory diseases: Secondary | ICD-10-CM

## 2024-07-27 DIAGNOSIS — N1831 Chronic kidney disease, stage 3a: Secondary | ICD-10-CM | POA: Diagnosis present

## 2024-07-27 DIAGNOSIS — Z8719 Personal history of other diseases of the digestive system: Secondary | ICD-10-CM

## 2024-07-27 DIAGNOSIS — K3189 Other diseases of stomach and duodenum: Secondary | ICD-10-CM | POA: Diagnosis present

## 2024-07-27 DIAGNOSIS — Z79890 Hormone replacement therapy (postmenopausal): Secondary | ICD-10-CM

## 2024-07-27 DIAGNOSIS — Z5982 Transportation insecurity: Secondary | ICD-10-CM

## 2024-07-27 DIAGNOSIS — Z888 Allergy status to other drugs, medicaments and biological substances status: Secondary | ICD-10-CM

## 2024-07-27 LAB — MAGNESIUM: Magnesium: 1.8 mg/dL (ref 1.7–2.4)

## 2024-07-27 LAB — COMPREHENSIVE METABOLIC PANEL WITH GFR
ALT: 43 U/L (ref 0–44)
AST: 153 U/L — ABNORMAL HIGH (ref 15–41)
Albumin: 3.3 g/dL — ABNORMAL LOW (ref 3.5–5.0)
Alkaline Phosphatase: 122 U/L (ref 38–126)
Anion gap: 11 (ref 5–15)
BUN: 18 mg/dL (ref 8–23)
CO2: 25 mmol/L (ref 22–32)
Calcium: 8.4 mg/dL — ABNORMAL LOW (ref 8.9–10.3)
Chloride: 103 mmol/L (ref 98–111)
Creatinine, Ser: 1.09 mg/dL — ABNORMAL HIGH (ref 0.44–1.00)
GFR, Estimated: 56 mL/min — ABNORMAL LOW (ref >60)
Glucose, Bld: 109 mg/dL — ABNORMAL HIGH (ref 70–99)
Potassium: 4.2 mmol/L (ref 3.5–5.1)
Sodium: 139 mmol/L (ref 135–145)
Total Bilirubin: 1.1 mg/dL (ref 0.0–1.2)
Total Protein: 7.7 g/dL (ref 6.5–8.1)

## 2024-07-27 LAB — BRAIN NATRIURETIC PEPTIDE: B Natriuretic Peptide: 435.4 pg/mL — ABNORMAL HIGH (ref 0.0–100.0)

## 2024-07-27 LAB — PROTIME-INR
INR: 1.4 — ABNORMAL HIGH (ref 0.8–1.2)
Prothrombin Time: 17.8 s — ABNORMAL HIGH (ref 11.4–15.2)

## 2024-07-27 LAB — CBC WITH DIFFERENTIAL/PLATELET
Abs Immature Granulocytes: 0.03 K/uL (ref 0.00–0.07)
Basophils Absolute: 0.1 K/uL (ref 0.0–0.1)
Basophils Relative: 2 %
Eosinophils Absolute: 0 K/uL (ref 0.0–0.5)
Eosinophils Relative: 1 %
HCT: 28.1 % — ABNORMAL LOW (ref 36.0–46.0)
Hemoglobin: 8.4 g/dL — ABNORMAL LOW (ref 12.0–15.0)
Immature Granulocytes: 1 %
Lymphocytes Relative: 13 %
Lymphs Abs: 0.4 K/uL — ABNORMAL LOW (ref 0.7–4.0)
MCH: 23.9 pg — ABNORMAL LOW (ref 26.0–34.0)
MCHC: 29.9 g/dL — ABNORMAL LOW (ref 30.0–36.0)
MCV: 80.1 fL (ref 80.0–100.0)
Monocytes Absolute: 0.3 K/uL (ref 0.1–1.0)
Monocytes Relative: 9 %
Neutro Abs: 2.4 K/uL (ref 1.7–7.7)
Neutrophils Relative %: 74 %
Platelets: 112 K/uL — ABNORMAL LOW (ref 150–400)
RBC: 3.51 MIL/uL — ABNORMAL LOW (ref 3.87–5.11)
RDW: 28.6 % — ABNORMAL HIGH (ref 11.5–15.5)
WBC: 3.3 K/uL — ABNORMAL LOW (ref 4.0–10.5)
nRBC: 1.2 % — ABNORMAL HIGH (ref 0.0–0.2)

## 2024-07-27 LAB — TROPONIN I (HIGH SENSITIVITY)
Troponin I (High Sensitivity): 10 ng/L (ref <18)
Troponin I (High Sensitivity): 9 ng/L (ref <18)

## 2024-07-27 LAB — RESP PANEL BY RT-PCR (RSV, FLU A&B, COVID)  RVPGX2
Influenza A by PCR: NEGATIVE
Influenza B by PCR: NEGATIVE
Resp Syncytial Virus by PCR: NEGATIVE
SARS Coronavirus 2 by RT PCR: NEGATIVE

## 2024-07-27 LAB — TYPE AND SCREEN
ABO/RH(D): A POS
Antibody Screen: NEGATIVE

## 2024-07-27 LAB — APTT: aPTT: 36 s (ref 24–36)

## 2024-07-27 MED ORDER — HYDRALAZINE HCL 20 MG/ML IJ SOLN: 5.0000 mg | Freq: Four times a day (QID) | INTRAMUSCULAR | Status: DC | PRN

## 2024-07-27 MED ORDER — FENTANYL CITRATE PF 50 MCG/ML IJ SOSY
25.0000 ug | PREFILLED_SYRINGE | Freq: Once | INTRAMUSCULAR | Status: AC
  Administered 2024-07-27: 25 ug via INTRAVENOUS
  Filled 2024-07-27: qty 1

## 2024-07-27 MED ORDER — PANTOPRAZOLE SODIUM 40 MG IV SOLR
40.0000 mg | Freq: Two times a day (BID) | INTRAVENOUS | Status: DC
  Administered 2024-07-27 – 2024-07-29 (×4): 40 mg via INTRAVENOUS
  Filled 2024-07-27 (×4): qty 10

## 2024-07-27 MED ORDER — ONDANSETRON HCL 4 MG/2ML IJ SOLN
4.0000 mg | Freq: Four times a day (QID) | INTRAMUSCULAR | Status: DC | PRN
  Administered 2024-07-30: 4 mg via INTRAVENOUS
  Filled 2024-07-27: qty 2

## 2024-07-27 MED ORDER — THIAMINE HCL 100 MG/ML IJ SOLN
100.0000 mg | Freq: Once | INTRAMUSCULAR | Status: AC
  Administered 2024-07-27: 100 mg via INTRAVENOUS
  Filled 2024-07-27: qty 2
  Filled 2024-07-27: qty 1

## 2024-07-27 MED ORDER — ACETAMINOPHEN 650 MG RE SUPP
650.0000 mg | Freq: Four times a day (QID) | RECTAL | Status: DC | PRN
Start: 2024-07-27 — End: 2024-08-01

## 2024-07-27 MED ORDER — IOHEXOL 350 MG/ML SOLN
100.0000 mL | Freq: Once | INTRAVENOUS | Status: AC | PRN
  Administered 2024-07-27: 100 mL via INTRAVENOUS

## 2024-07-27 MED ORDER — THIAMINE MONONITRATE 100 MG PO TABS
100.0000 mg | ORAL_TABLET | Freq: Every day | ORAL | Status: DC
Start: 2024-07-28 — End: 2024-08-02
  Administered 2024-07-28 – 2024-08-02 (×6): 100 mg via ORAL
  Filled 2024-07-27 (×6): qty 1

## 2024-07-27 MED ORDER — ACETAMINOPHEN 325 MG PO TABS
650.0000 mg | ORAL_TABLET | Freq: Four times a day (QID) | ORAL | Status: DC | PRN
Start: 2024-07-27 — End: 2024-08-01
  Administered 2024-07-30 (×2): 650 mg via ORAL
  Filled 2024-07-27 (×2): qty 2

## 2024-07-27 MED ORDER — NADOLOL 40 MG PO TABS
80.0000 mg | ORAL_TABLET | Freq: Every day | ORAL | Status: DC
  Administered 2024-07-27 – 2024-08-02 (×7): 80 mg via ORAL
  Filled 2024-07-27 (×7): qty 2

## 2024-07-27 MED ORDER — LEVOTHYROXINE SODIUM 112 MCG PO TABS
112.0000 ug | ORAL_TABLET | Freq: Every day | ORAL | Status: DC
  Administered 2024-07-28 – 2024-08-02 (×6): 112 ug via ORAL
  Filled 2024-07-27 (×6): qty 1

## 2024-07-27 MED ORDER — SENNOSIDES-DOCUSATE SODIUM 8.6-50 MG PO TABS: 1.0000 | ORAL_TABLET | Freq: Every evening | ORAL | Status: DC | PRN

## 2024-07-27 MED ORDER — ONDANSETRON HCL 4 MG PO TABS: 4.0000 mg | ORAL_TABLET | Freq: Four times a day (QID) | ORAL | Status: DC | PRN

## 2024-07-27 MED ORDER — PANTOPRAZOLE SODIUM 40 MG IV SOLR
40.0000 mg | Freq: Once | INTRAVENOUS | Status: AC
  Administered 2024-07-27: 40 mg via INTRAVENOUS
  Filled 2024-07-27: qty 10

## 2024-07-27 NOTE — Assessment & Plan Note (Signed)
 Home levothyroxine 112 mcg daily resumed

## 2024-07-27 NOTE — Assessment & Plan Note (Addendum)
 Aspiration precaution Likely patient vomited instead of coughing up blood as she endorses feelings of vomiting and nausea prior to vomiting up blood. Protonix  40 mg IV twice daily Gastroenterology has been consulted Clear liquid diet PIV: Ensure to peripheral IV (peripheral large-bore) Admit to PCU, inpatient

## 2024-07-27 NOTE — Consult Note (Signed)
 [facility] GI Inpatient Consult Note   Kathryn [name], M.D.  Reason for Consult: Lower GI bleed, hemoptysis, anemia   Attending Requesting Consult: Amy Cox, D.O.  Outpatient Primary Physician: Not applicable.  History of Present Illness: Kathryn [name] is a 68 y.o. female with a history of alcoholic cirrhosis with varices, ongoing alcohol use, congestive heart failure, portal vein thrombosis, paroxysmal atrial fibrillation on Eliquis  presenting to the hospital today with complaints of SOB and coughing up of blood The patient presents today with complaints of hematochezia and hemoptysis versus hematemesis.  She is a poor historian.   She came in complaining of shortness of breath for the last few days.  She has a history of COPD/asthma and reportedly vomited some blood earlier today with clots.previously banded by me in 2022 with follow-up upper endoscopy performed in 2024 by Dr. Therisa, my colleague which showed moderate portal hypertensive gastropathy that required some APC cautery. She reports no abdominal pain with her rectal bleeding, but also reports symptoms of constipation with each of the two episodes of bleeding she has had two weeks ago and then again yesterday. She still drinks vodka about half a pint a day. She reports that her last colonoscopy was 10+ years ago by DR. Robert Ellliott and was deemed normal though she can't be sure.      Past Medical History:  Past Medical History:  Diagnosis Date   Alcoholic cirrhosis of liver (HCC)    Asthma    CHF (congestive heart failure) (HCC)    Chronic disease anemia    Esophageal varices (HCC)    GR I on EGD by Dr Jeri 09/2014   ETOH abuse    Hypertension    Hypothyroidism    Murmur    Portal hypertension (HCC)    Sarcoidosis     Problem List: Patient Active Problem List   Diagnosis Date Noted   Cough with hemoptysis 07/27/2024   Hyperlipemia 07/27/2024   AKI (acute kidney injury) (HCC) 02/22/2024    Alcohol use 02/22/2024   Demand ischemia (HCC) 02/21/2024   Atrial fibrillation with rapid ventricular response (HCC) 02/18/2024   NSTEMI (non-ST elevated myocardial infarction) (HCC) 02/18/2024   Alcoholic cirrhosis of liver (HCC)    Nausea vomiting and diarrhea 07/28/2023   Nausea and vomiting 07/28/2023   Colitis 07/27/2023   High anion gap metabolic acidosis 07/27/2023   Alcohol use disorder 12/02/2022   Sarcoid 12/02/2022   Hypokalemia 01/05/2022   Hypomagnesemia 01/05/2022   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   Physical deconditioning 01/04/2022   Cholelithiasis without cholecystitis 01/03/2022   Thrombocytopenia (HCC) 01/03/2022   Aortic atherosclerosis (HCC)    Acute pancreatitis 01/02/2022   Symptomatic anemia 06/24/2021   Hypertensive urgency 06/24/2021   Asthma 06/24/2021   CKD (chronic kidney disease), stage IIIa 06/24/2021   Chronic CHF (HCC) 06/24/2021   Pancytopenia (HCC) 06/24/2021   Community acquired pneumonia 11/20/2019   Pneumonia due to COVID-19 virus 11/20/2019   Anemia 06/12/2016   Hypoglycemia 02/11/2016   Hematemesis 04/04/2015   Cirrhosis (HCC) 04/04/2015   Esophageal varices (HCC) 04/04/2015   Hypothyroidism 04/04/2015   Hypertension 04/04/2015   Chest pain 04/04/2015    Past Surgical History: Past Surgical History:  Procedure Laterality Date   ESOPHAGOGASTRODUODENOSCOPY N/A 06/26/2021   Procedure: ESOPHAGOGASTRODUODENOSCOPY (EGD);  Surgeon: Nolia Tschantz, Kathryn POUR, MD;  Location: [facility] ENDOSCOPY;  Service: Gastroenterology;  Laterality: N/A;   ESOPHAGOGASTRODUODENOSCOPY (EGD) WITH PROPOFOL  N/A 04/04/2015   Procedure: ESOPHAGOGASTRODUODENOSCOPY (EGD) WITH PROPOFOL ;  Surgeon:  Rogelia Copping, MD;  Location: [facility] ENDOSCOPY;  Service: Endoscopy;  Laterality: N/A;  Dr COPPING prefers 1445   ESOPHAGOGASTRODUODENOSCOPY (EGD) WITH PROPOFOL  N/A 12/04/2022   Procedure: ESOPHAGOGASTRODUODENOSCOPY (EGD) WITH PROPOFOL ;  Surgeon: Therisa Bi, MD;  Location: [facility] ENDOSCOPY;   Service: Gastroenterology;  Laterality: N/A;   ESOPHAGOGASTRODUODENOSCOPY W/ BANDING  09/2014   Rein   LEFT HEART CATH AND CORONARY ANGIOGRAPHY N/A 02/21/2024   Procedure: LEFT HEART CATH AND CORONARY ANGIOGRAPHY;  Surgeon: Mady Bruckner, MD;  Location: [facility] INVASIVE CV LAB;  Service: Cardiovascular;  Laterality: N/A;   TUBAL LIGATION      Allergies: Allergies  Allergen Reactions   Oxycodone -Acetaminophen     Hydrocodone-Acetaminophen  Other (See Comments)    Reaction: pt can't take anything with tylenol  due to her liver disease.    Ibuprofen Other (See Comments)    Esophageal varices   Naproxen Other (See Comments)    Esophageal varices   Propoxyphene Other (See Comments)    Reaction: pt isn't sure, but knows she can't take it.    Shellfish Allergy Hives   Tamiflu  [Oseltamivir Phosphate] Other (See Comments)    Esophageal varices    Home Medications: (Not in a hospital admission)  Home medication reconciliation was completed with the patient.   Scheduled Inpatient Medications:    [START ON 07/28/2024] levothyroxine   112 mcg Oral Q0600   nadolol   80 mg Oral Daily   pantoprazole  (PROTONIX ) IV  40 mg Intravenous BID   [START ON 07/28/2024] thiamine   100 mg Oral Daily    Continuous Inpatient Infusions:    PRN Inpatient Medications:  acetaminophen  **OR** acetaminophen , hydrALAZINE , ondansetron  **OR** ondansetron  (ZOFRAN ) IV, senna-docusate  Family History: family history includes Aneurysm in her father; COPD in her father; Cerebral aneurysm in her father; Heart attack in her mother; Hypertension in her brother, mother, and sister.   GI Family History: Negative  Social History:   reports that she has never smoked. She has never used smokeless tobacco. She reports current alcohol use of about 15.0 standard drinks of alcohol per week. She reports that she does not use drugs. The patient denies ETOH, tobacco, or drug use.    Review of Systems: Review of Systems - Negative  except HPI  Physical Examination: BP (!) 181/88   Pulse 62   Temp 98.4 F (36.9 C)   Resp 18   Ht 5' 4 (1.626 m)   Wt 83 kg   SpO2 100%   BMI 31.41 kg/m  Physical Exam Constitutional:      General: She is not in acute distress.    Appearance: She is well-developed. She is not ill-appearing, toxic-appearing or diaphoretic.  HENT:     Head: Normocephalic and atraumatic.     Mouth/Throat:     Mouth: Mucous membranes are moist.     Pharynx: Oropharynx is clear. No pharyngeal swelling.  Eyes:     Pupils: Pupils are equal, round, and reactive to light.  Neck:     Trachea: No tracheal deviation.  Cardiovascular:     Rate and Rhythm: Normal rate and regular rhythm. No extrasystoles are present.    Heart sounds: No murmur heard. Pulmonary:     Effort: Pulmonary effort is normal.     Breath sounds: Normal breath sounds. No wheezing or rhonchi.  Chest:     Chest wall: No mass or deformity.  Abdominal:     Palpations: Abdomen is soft. There is no hepatomegaly or mass.     Tenderness: There is no abdominal  tenderness. There is no guarding.  Musculoskeletal:     Cervical back: Neck supple.     Right lower leg: No edema.  Skin:    General: Skin is warm and dry.     Capillary Refill: Capillary refill takes less than 2 seconds.  Neurological:     General: No focal deficit present.     Mental Status: She is alert.  Psychiatric:        Mood and Affect: Mood normal. Mood is not anxious.        Behavior: Behavior normal. Behavior is not agitated.     Data: Lab Results  Component Value Date   WBC 3.3 (L) 07/27/2024   HGB 8.4 (L) 07/27/2024   HCT 28.1 (L) 07/27/2024   MCV 80.1 07/27/2024   PLT 112 (L) 07/27/2024   Recent Labs  Lab 07/27/24 0732  HGB 8.4*   Lab Results  Component Value Date   NA 139 07/27/2024   K 4.2 07/27/2024   CL 103 07/27/2024   CO2 25 07/27/2024   BUN 18 07/27/2024   CREATININE 1.09 (H) 07/27/2024   Lab Results  Component Value Date   ALT  43 07/27/2024   AST 153 (H) 07/27/2024   ALKPHOS 122 07/27/2024   BILITOT 1.1 07/27/2024   Recent Labs  Lab 07/27/24 0732  APTT 36  INR 1.4*      Latest Ref Rng & Units 07/27/2024    7:32 AM 06/07/2024    8:58 AM 04/10/2024   11:57 AM  CBC  WBC 4.0 - 10.5 K/uL 3.3  4.3  4.2   Hemoglobin 12.0 - 15.0 g/dL 8.4  9.4  9.0   Hematocrit 36.0 - 46.0 % 28.1  32.2  30.3   Platelets 150 - 400 K/uL 112  200  165     STUDIES: CT Angio Chest/Abd/Pel for Dissection W and/or Wo Contrast Result Date: 07/27/2024 CLINICAL DATA:  Acute aortic syndrome (AAS) suspected. Shortness of breath, vomiting EXAM: CT ANGIOGRAPHY CHEST, ABDOMEN AND PELVIS TECHNIQUE: Non-contrast CT of the chest was initially obtained. Multidetector CT imaging through the chest, abdomen and pelvis was performed using the standard protocol during bolus administration of intravenous contrast. Multiplanar reconstructed images and MIPs were obtained and reviewed to evaluate the vascular anatomy. RADIATION DOSE REDUCTION: This exam was performed according to the departmental dose-optimization program which includes automated exposure control, adjustment of the mA and/or kV according to patient size and/or use of iterative reconstruction technique. CONTRAST:  OMNIPAQUE  IOHEXOL  350 MG/ML SOLN COMPARISON:  06/07/2024 FINDINGS: CTA CHEST FINDINGS Cardiovascular: Heart is normal size. Aorta is normal caliber. Scattered coronary artery and aortic atherosclerosis. No evidence of aortic dissection. No filling defects in the pulmonary arteries to suggest pulmonary emboli. Mediastinum/Nodes: No mediastinal, hilar, or axillary adenopathy. Trachea and esophagus are unremarkable. Thyroid unremarkable. Moderate-sized hiatal hernia is stable. Lungs/Pleura: Mild centrilobular emphysema. No confluent opacities or effusions. Musculoskeletal: Chest wall soft tissues are unremarkable. No acute bony abnormality. Review of the MIP images confirms the above findings.  CTA ABDOMEN AND PELVIS FINDINGS VASCULAR Aorta: Moderate aortic atherosclerosis. No aneurysm, dissection or significant stenosis. Celiac: Patent without evidence of aneurysm, dissection, vasculitis or significant stenosis. SMA: Patent without evidence of aneurysm, dissection, vasculitis or significant stenosis. Renals: Both renal arteries are patent without evidence of aneurysm, dissection, vasculitis, fibromuscular dysplasia or significant stenosis. IMA: Patent without evidence of aneurysm, dissection, vasculitis or significant stenosis. Inflow: Patent without evidence of aneurysm, dissection, vasculitis or significant stenosis. Veins: No obvious venous  abnormality within the limitations of this arterial phase study. Review of the MIP images confirms the above findings. NON-VASCULAR Hepatobiliary: Severe diffuse fatty infiltration of the liver, progressed since prior study. Layering gallstones within the gallbladder also stable. No biliary ductal dilatation. Pancreas: No focal abnormality or ductal dilatation. Spleen: No focal abnormality.  Normal size. Adrenals/Urinary Tract: Adrenal glands normal. Bilat renal cysts warrant no additional follow-up imaging. No hydronephrosis. Urinary bladder unremarkable. Stomach/Bowel: Areas of wall thickening within the colon, most notable in the right colon and transverse colon. This could reflect colitis or portal colopathy. No bowel obstruction or inflammatory process. Normal appendix. Lymphatic: No adenopathy Reproductive: Calcified and noncalcified uterine fibroids. No adnexal mass. Other: Small to mild free fluid in the abdomen and pelvis. Musculoskeletal: No acute bony abnormality. Stable hemangioma in the L5 vertebral body. Review of the MIP images confirms the above findings. IMPRESSION: No evidence of aortic aneurysm or dissection. No evidence of pulmonary embolus. Coronary artery disease, aortic atherosclerosis. Stable moderate-sized hiatal hernia. Severe diffuse fatty  infiltration of the liver, worsening since prior study. Cholelithiasis. Mild free fluid in the abdomen and pelvis. Areas of wall thickening in the right colon and transverse colon. This could reflect colitis or portal colopathy. Stable circumferential wall thickening in the region of the rectum. This could reflect proctitis. Recommend clinical correlation. Consider further evaluation with colonoscopy to exclude mass lesion. Electronically Signed   By: Franky Crease M.D.   On: 07/27/2024 12:17   DG Chest Portable 1 View Result Date: 07/27/2024 CLINICAL DATA:  Shortness of breath onset 4:30 a.m., coughed up at least 1 blood clot. Vomiting last night. EXAM: PORTABLE CHEST 1 VIEW COMPARISON:  AP Lat chest 06/07/2024 FINDINGS: The lungs are clear. The sulci are sharp. There is mild cardiomegaly. Loop recorder device left chest wall. The mediastinum is normally outlined apart from a small to moderate-sized hiatal hernia. There is calcification in the transverse aorta. No significant osseous findings. Overlying telemetry leads. IMPRESSION: 1. No evidence of acute chest disease. 2. Mild cardiomegaly. 3. Small to moderate-sized hiatal hernia. Electronically Signed   By: Francis Quam M.D.   On: 07/27/2024 07:39   @IMAGES @  Assessment: Principal Problem:   Cough with hemoptysis Active Problems:   Cirrhosis (HCC)   Esophageal varices (HCC)   Hypothyroidism   Hypertension   Physical deconditioning   Alcohol use disorder   Alcoholic cirrhosis of liver (HCC)   Demand ischemia (HCC)   Hyperlipemia  Ms. Mckensie Scotti is a 68 year old female with history of alcoholic cirrhosis with ongoing alcohol use, esophageal varices who is presenting with symptoms of hematemesis and painless lower GI bleeding.  Differential diagnosis for upper GI bleeding includes Mallory-Weiss tear, esophageal varices, peptic ulcer disease.  Lower GI bleeding causes may include ischemic colitis, internal hemorrhoids (particularly occurring  with symptoms of constipation), colonic malignancy, diverticular bleeding or less likely ischemic colitis.  Currently bleeding has resolved clinically.  Recommendations: 1.  Hold Eliquis . 2.  IV acid suppression. 3.  Consider IV octreotide  bolus and drip. 4.  Continue serial hematocrit determinations. 5.  Monitor mental status and consider CIWA protocol considering ongoing alcohol use reported by patient. 6.  EGD and colonoscopy when clinically feasible, preferably when Eliquis  can be held for 48 to 72 hours and patient is clinically stable. 7.  Dr. Maryruth will assume GI hospitalist care beginning tomorrow. Thank you for the consult. Please call with questions or concerns.  Aundria Kathryn Francis MD [facility] [address] [place],  [place] [address] [phone]  07/27/2024 4:34 PM

## 2024-07-27 NOTE — H&P (Signed)
 History and Physical   Kathryn [name] FMW:[id number] DOB: 02-20-1956 DOA: 07/27/2024  PCP: Odell Tor Edra CINDERELLA, MD Patient coming from: Home  I have personally briefly reviewed patient's old medical records in [facility] EMR.  Chief Concern: Shortness of breath, coughing up blood  HPI: [name] is a 68 year old female with history of fatty liver, liver cirrhosis, paroxysmal atrial fibrillation on Eliquis , obesity, hypothyroid, hyperlipidemia, who presents emergency department for chief concerns of shortness of breath and coughing up blood.  Vitals in the ED showed t of 98.4, rr 16, hr of 66, blood pressure 181/99, SpO2 100% on room air.  Serum sodium is 139, potassium 4.2, chloride 103, bicarb 25, BUN of 18, serum creatinine 1.09, eGFR 56, nonfasting glucose 109, WBC 3.3, hemoglobin 8.4, platelets of 112.  BNP was elevated 435.4.  HS troponin was 10 and on repeat is 9.  ED treatment: Fentanyl  25 mcg IV one-time dose, Protonix  40 mg IV one-time dose, thiamine  100 mg IV one-time dose. ------------------------------------- At bedside, patient able to tell me her first and last name, age, location, current calendar year.  She reports that she has been feeling short of breath for the last few days.  She reports that she thought that she had an asthma attack today however she felt like she wanted to vomit and when she vomited, they were a large amount of blood with some clots that was noted.  She states she only vomited once.  She reports that yesterday she had bright red blood per rectum that turned the toilet pink.  She reports that this has never happened before.  She reports she last had a colonoscopy more than 10 years ago and it was normal at that time.  She denies trauma to her person, chest pain, abdominal pain, dysuria, hematuria, vaginal bleeding, diarrhea.  She reports she is more constipated.  She reports her stool normally is brown in color.  Social history: She lives at  home.  She denies tobacco, EtOH, recreational drug use.  She is retired.  ROS: Constitutional: no weight change, no fever ENT/Mouth: no sore throat, no rhinorrhea Eyes: no eye pain, no vision changes Cardiovascular: no chest pain, no dyspnea,  no edema, no palpitations Respiratory: no cough, no sputum, no wheezing Gastrointestinal: + nausea, + vomiting, no diarrhea, no constipation Genitourinary: no urinary incontinence, no dysuria, no hematuria Musculoskeletal: no arthralgias, no myalgias Skin: no skin lesions, no pruritus, Neuro: + weakness, no loss of consciousness, no syncope Psych: no anxiety, no depression, + decrease appetite Heme/Lymph: no bruising, no bleeding  ED Course: Discussed with EDP, patient requiring hospitalization for chief concerns of anemia, and coughing up blood along with complaints of bright red blood per rectum.  Assessment/Plan  Principal Problem:   Cough with hemoptysis Active Problems:   Cirrhosis (HCC)   Esophageal varices (HCC)   Alcohol use disorder   Alcoholic cirrhosis of liver (HCC)   Hypertension   Hypothyroidism   Physical deconditioning   Demand ischemia (HCC)   Hyperlipemia   Assessment and Plan:  * Cough with hemoptysis Aspiration precaution Likely patient vomited instead of coughing up blood as she endorses feelings of vomiting and nausea prior to vomiting up blood. Protonix  40 mg IV twice daily Gastroenterology has been consulted Clear liquid diet PIV: Ensure to peripheral IV (peripheral large-bore) Admit to PCU, inpatient  Hypertension Home nadolol  80 mg daily resume Hydralazine  5 mg IV every 6 hours as needed for SBP under 170, 5 days ordered  Hyperlipemia Patient states she stopped taking atorvastatin  due to it upsetting her stomach  Physical deconditioning Fall precaution  Hypothyroidism Home levothyroxine  112 mcg daily resumed  Chart reviewed.   DVT prophylaxis: Pharmacologic DVT not initiated on admission.  AM  team to initiate pharmacologic DVT when the benefits outweigh the risk.  Code Status: Full code Diet: Clear liquid diet Family Communication: Sister, Therisa Hope was updated at patient's request Disposition Plan: Pending clinical course Consults called: Gastroenterology Admission status: PCU, inpatient  Past Medical History:  Diagnosis Date   Alcoholic cirrhosis of liver (HCC)    Asthma    CHF (congestive heart failure) (HCC)    Chronic disease anemia    Esophageal varices (HCC)    GR I on EGD by Dr Jeri 09/2014   ETOH abuse    Hypertension    Hypothyroidism    Murmur    Portal hypertension (HCC)    Sarcoidosis    Past Surgical History:  Procedure Laterality Date   ESOPHAGOGASTRODUODENOSCOPY N/A 06/26/2021   Procedure: ESOPHAGOGASTRODUODENOSCOPY (EGD);  Surgeon: Toledo, Ladell POUR, MD;  Location: [facility] ENDOSCOPY;  Service: Gastroenterology;  Laterality: N/A;   ESOPHAGOGASTRODUODENOSCOPY (EGD) WITH PROPOFOL  N/A 04/04/2015   Procedure: ESOPHAGOGASTRODUODENOSCOPY (EGD) WITH PROPOFOL ;  Surgeon: Rogelia Copping, MD;  Location: [facility] ENDOSCOPY;  Service: Endoscopy;  Laterality: N/A;  Dr COPPING prefers 1445   ESOPHAGOGASTRODUODENOSCOPY (EGD) WITH PROPOFOL  N/A 12/04/2022   Procedure: ESOPHAGOGASTRODUODENOSCOPY (EGD) WITH PROPOFOL ;  Surgeon: Therisa Bi, MD;  Location: [facility] ENDOSCOPY;  Service: Gastroenterology;  Laterality: N/A;   ESOPHAGOGASTRODUODENOSCOPY W/ BANDING  09/2014   [place]in   LEFT HEART CATH AND CORONARY ANGIOGRAPHY N/A 02/21/2024   Procedure: LEFT HEART CATH AND CORONARY ANGIOGRAPHY;  Surgeon: Mady Bruckner, MD;  Location: [facility] INVASIVE CV LAB;  Service: Cardiovascular;  Laterality: N/A;   TUBAL LIGATION     Social History:  reports that she has never smoked. She has never used smokeless tobacco. She reports current alcohol use of about 15.0 standard drinks of alcohol per week. She reports that she does not use drugs.  Allergies  Allergen Reactions   Oxycodone -Acetaminophen      Hydrocodone-Acetaminophen  Other (See Comments)    Reaction: pt can't take anything with tylenol  due to her liver disease.    Ibuprofen Other (See Comments)    Esophageal varices   Naproxen Other (See Comments)    Esophageal varices   Propoxyphene Other (See Comments)    Reaction: pt isn't sure, but knows she can't take it.    Shellfish Allergy Hives   Tamiflu  [Oseltamivir Phosphate] Other (See Comments)    Esophageal varices   Family History  Problem Relation Age of Onset   Aneurysm Father    COPD Father    Heart attack Mother    Family history: Family history reviewed and not pertinent.  Prior to Admission medications   Medication Sig Start Date End Date Taking? Authorizing Provider  albuterol  (VENTOLIN  HFA) 108 (90 Base) MCG/ACT inhaler Inhale 1-2 puffs into the lungs every 4 (four) hours as needed for wheezing or shortness of breath. 06/14/23   [provider]  apixaban  (ELIQUIS ) 5 MG TABS tablet Take 1 tablet (5 mg total) by mouth 2 (two) times daily. 02/22/24   Lenon Marien CROME, MD  atorvastatin  (LIPITOR) 10 MG tablet Take 1 tablet (10 mg total) by mouth daily. 02/23/24   Lenon Marien CROME, MD  levothyroxine  (SYNTHROID ) 112 MCG tablet 1 tablet in the morning on an empty stomach Orally Once a day for 90 days 04/26/24  [provider]  Multiple Vitamin (MULTIVITAMIN) tablet Take 1 tablet by mouth every other day. Patient not taking: Reported on 05/17/2024    [provider]  nadolol  (CORGARD ) 40 MG tablet Take 2 tablets (80 mg total) by mouth daily. 02/23/24   Lenon Marien CROME, MD  thiamine  (VITAMIN B-1) 100 MG tablet Take 1 tablet (100 mg total) by mouth daily. 02/23/24   Lenon Marien CROME, MD   Physical Exam: Vitals:   07/27/24 0717 07/27/24 0730 07/27/24 0852 07/27/24 1009  BP: (!) 181/98 (!) 181/99  (!) 172/85  Pulse: 68 66 66 64  Resp: 14 16 18 20   Temp: 98.4 F (36.9 C)   98 F (36.7 C)  TempSrc: Oral   Oral  SpO2: 100% 100% 100% 100%   Weight:      Height:       Constitutional: appears frail, appears older than chronological age, calm Eyes: PERRL, lids and conjunctivae normal ENMT: Mucous membranes are moist. Posterior pharynx clear of any exudate or lesions. Age-appropriate dentition. Hearing appropriate Neck: normal, supple, no masses, no thyromegaly Respiratory: clear to auscultation bilaterally, no wheezing, no crackles. Normal respiratory effort. No accessory muscle use.  Cardiovascular: Regular rate and rhythm, no murmurs / rubs / gallops. No extremity edema. 2+ pedal pulses. No carotid bruits.  Abdomen: Obese abdomen, no tenderness, no masses palpated, no hepatosplenomegaly. Bowel sounds positive.  Musculoskeletal: no clubbing / cyanosis. No joint deformity upper and lower extremities. Good ROM, no contractures, no atrophy. Normal muscle tone.  Skin: no rashes, lesions, ulcers. No induration Neurologic: Sensation intact. Strength 5/5 in all 4.  Psychiatric: Normal judgment and insight. Alert and oriented x 3. Normal mood.   EKG: independently reviewed, showing sinus rhythm with rate of 70, QTc 463.  Chest x-ray on Admission: I personally reviewed and I agree with radiologist reading as below.  CT Angio Chest/Abd/Pel for Dissection W and/or Wo Contrast Result Date: 07/27/2024 CLINICAL DATA:  Acute aortic syndrome (AAS) suspected. Shortness of breath, vomiting EXAM: CT ANGIOGRAPHY CHEST, ABDOMEN AND PELVIS TECHNIQUE: Non-contrast CT of the chest was initially obtained. Multidetector CT imaging through the chest, abdomen and pelvis was performed using the standard protocol during bolus administration of intravenous contrast. Multiplanar reconstructed images and MIPs were obtained and reviewed to evaluate the vascular anatomy. RADIATION DOSE REDUCTION: This exam was performed according to the departmental dose-optimization program which includes automated exposure control, adjustment of the mA and/or kV according to patient  size and/or use of iterative reconstruction technique. CONTRAST:  OMNIPAQUE  IOHEXOL  350 MG/ML SOLN COMPARISON:  06/07/2024 FINDINGS: CTA CHEST FINDINGS Cardiovascular: Heart is normal size. Aorta is normal caliber. Scattered coronary artery and aortic atherosclerosis. No evidence of aortic dissection. No filling defects in the pulmonary arteries to suggest pulmonary emboli. Mediastinum/Nodes: No mediastinal, hilar, or axillary adenopathy. Trachea and esophagus are unremarkable. Thyroid unremarkable. Moderate-sized hiatal hernia is stable. Lungs/Pleura: Mild centrilobular emphysema. No confluent opacities or effusions. Musculoskeletal: Chest wall soft tissues are unremarkable. No acute bony abnormality. Review of the MIP images confirms the above findings. CTA ABDOMEN AND PELVIS FINDINGS VASCULAR Aorta: Moderate aortic atherosclerosis. No aneurysm, dissection or significant stenosis. Celiac: Patent without evidence of aneurysm, dissection, vasculitis or significant stenosis. SMA: Patent without evidence of aneurysm, dissection, vasculitis or significant stenosis. Renals: Both renal arteries are patent without evidence of aneurysm, dissection, vasculitis, fibromuscular dysplasia or significant stenosis. IMA: Patent without evidence of aneurysm, dissection, vasculitis or significant stenosis. Inflow: Patent without evidence of aneurysm, dissection, vasculitis or significant  stenosis. Veins: No obvious venous abnormality within the limitations of this arterial phase study. Review of the MIP images confirms the above findings. NON-VASCULAR Hepatobiliary: Severe diffuse fatty infiltration of the liver, progressed since prior study. Layering gallstones within the gallbladder also stable. No biliary ductal dilatation. Pancreas: No focal abnormality or ductal dilatation. Spleen: No focal abnormality.  Normal size. Adrenals/Urinary Tract: Adrenal glands normal. Bilat renal cysts warrant no additional follow-up imaging.  No hydronephrosis. Urinary bladder unremarkable. Stomach/Bowel: Areas of wall thickening within the colon, most notable in the right colon and transverse colon. This could reflect colitis or portal colopathy. No bowel obstruction or inflammatory process. Normal appendix. Lymphatic: No adenopathy Reproductive: Calcified and noncalcified uterine fibroids. No adnexal mass. Other: Small to mild free fluid in the abdomen and pelvis. Musculoskeletal: No acute bony abnormality. Stable hemangioma in the L5 vertebral body. Review of the MIP images confirms the above findings. IMPRESSION: No evidence of aortic aneurysm or dissection. No evidence of pulmonary embolus. Coronary artery disease, aortic atherosclerosis. Stable moderate-sized hiatal hernia. Severe diffuse fatty infiltration of the liver, worsening since prior study. Cholelithiasis. Mild free fluid in the abdomen and pelvis. Areas of wall thickening in the right colon and transverse colon. This could reflect colitis or portal colopathy. Stable circumferential wall thickening in the region of the rectum. This could reflect proctitis. Recommend clinical correlation. Consider further evaluation with colonoscopy to exclude mass lesion. Electronically Signed   By: Franky Crease M.D.   On: 07/27/2024 12:17   DG Chest Portable 1 View Result Date: 07/27/2024 CLINICAL DATA:  Shortness of breath onset 4:30 a.m., coughed up at least 1 blood clot. Vomiting last night. EXAM: PORTABLE CHEST 1 VIEW COMPARISON:  AP Lat chest 06/07/2024 FINDINGS: The lungs are clear. The sulci are sharp. There is mild cardiomegaly. Loop recorder device left chest wall. The mediastinum is normally outlined apart from a small to moderate-sized hiatal hernia. There is calcification in the transverse aorta. No significant osseous findings. Overlying telemetry leads. IMPRESSION: 1. No evidence of acute chest disease. 2. Mild cardiomegaly. 3. Small to moderate-sized hiatal hernia. Electronically Signed    By: Francis Quam M.D.   On: 07/27/2024 07:39   Labs on Admission: I have personally reviewed following labs  CBC: Recent Labs  Lab 07/27/24 0732  WBC 3.3*  NEUTROABS 2.4  HGB 8.4*  HCT 28.1*  MCV 80.1  PLT 112*   Basic Metabolic Panel: Recent Labs  Lab 07/27/24 0732  NA 139  K 4.2  CL 103  CO2 25  GLUCOSE 109*  BUN 18  CREATININE 1.09*  CALCIUM  8.4*  MG 1.8   GFR: Estimated Creatinine Clearance: 52.2 mL/min (A) (by C-G formula based on SCr of 1.09 mg/dL (H)).  Liver Function Tests: Recent Labs  Lab 07/27/24 0732  AST 153*  ALT 43  ALKPHOS 122  BILITOT 1.1  PROT 7.7  ALBUMIN 3.3*   Coagulation Profile: Recent Labs  Lab 07/27/24 0732  INR 1.4*   Urine analysis:    Component Value Date/Time   COLORURINE YELLOW (A) 06/07/2024 1247   APPEARANCEUR CLEAR (A) 06/07/2024 1247   APPEARANCEUR Clear 09/25/2014 1610   LABSPEC 1.038 (H) 06/07/2024 1247   LABSPEC 1.023 09/25/2014 1610   PHURINE 5.0 06/07/2024 1247   GLUCOSEU NEGATIVE 06/07/2024 1247   GLUCOSEU Negative 09/25/2014 1610   HGBUR NEGATIVE 06/07/2024 1247   BILIRUBINUR NEGATIVE 06/07/2024 1247   BILIRUBINUR Negative 09/25/2014 1610   KETONESUR 20 (A) 06/07/2024 1247   PROTEINUR NEGATIVE 06/07/2024  1247   NITRITE NEGATIVE 06/07/2024 1247   LEUKOCYTESUR NEGATIVE 06/07/2024 1247   LEUKOCYTESUR Negative 09/25/2014 1610   This document was prepared using Dragon Voice Recognition software and may include unintentional dictation errors.  Dr. Sherre [facility]  If 7PM-7AM, please contact overnight-coverage provider If 7AM-7PM, please contact day attending provider [email or web address]  07/27/2024, 2:37 PM

## 2024-07-27 NOTE — ED Provider Notes (Signed)
 [facility] Provider Note    Event Date/Time   First MD Initiated Contact with Patient 07/27/24 [phone]     (approximate)   History   Shortness of Breath   HPI  Kathryn [name] is a 68 y.o. female with paroxysmal A-fib is on Eliquis , alcohol use disorder, cirrhosis she comes in for shortness of breath that started around 4:30 AM.  Reviewed patient's ER visit from 06/07/2024 where patient had a negative dissection study  Reviewed patient's admission summary from 02/17/2024 where patient was admitted for new A-fib, metabolic acidosis, alcoholic cirrhosis with known esophageal varices, portal vein thrombosis  Patient reports that she felt fine yesterday but then she started to develop some coughing overnight.  She reports feeling short of breath and having 1 episode of hemoptysis.  She also reports vomiting multiple times but she really feels like the blood was in her sputum not in any vomit.  She reports  compliant with all of her medications including her Eliquis .  She denies any chest pain, falls or hitting her head.  She reports a little bit of vague lower abdominal discomfort more on the right side.  She reports that after this episode she started to have blood come out of her right nose but this has since stopped.  She is adamant that the nosebleed did not start till after she coughed up the blood.  She also reports having some bright red blood per rectum over the past few days as well.  Physical Exam   Triage Vital Signs: ED Triage Vitals  Encounter Vitals Group     BP 07/27/24 0717 (!) 181/98     Girls Systolic BP Percentile --      Girls Diastolic BP Percentile --      Boys Systolic BP Percentile --      Boys Diastolic BP Percentile --      Pulse Rate 07/27/24 0717 68     Resp 07/27/24 0717 14     Temp 07/27/24 0717 98.4 F (36.9 C)     Temp Source 07/27/24 0717 Oral     SpO2 07/27/24 0717 100 %     Weight 07/27/24 0715 183 lb (83 kg)     Height  07/27/24 0715 5' 4 (1.626 m)     Head Circumference --      Peak Flow --      Pain Score 07/27/24 0714 0     Pain Loc --      Pain Education --      Exclude from Growth Chart --     Most recent vital signs: Vitals:   07/27/24 0717  BP: (!) 181/98  Pulse: 68  Resp: 14  Temp: 98.4 F (36.9 C)  SpO2: 100%     General: Awake, no distress.  CV:  Good peripheral perfusion.  Resp:  Normal effort.  Clear lungs Abd:  No distention.  Slight tenderness in the right lower abdomen Other:  External hemorrhoid noted.  Brown stool.  Hemoccult positive   ED Results / Procedures / Treatments   Labs (all labs ordered are listed, but only abnormal results are displayed) Labs Reviewed  RESP PANEL BY RT-PCR (RSV, FLU A&B, COVID)  [id number]  BRAIN NATRIURETIC PEPTIDE  CBC WITH DIFFERENTIAL/PLATELET  COMPREHENSIVE METABOLIC PANEL WITH GFR  PROTIME-INR  APTT  TYPE AND SCREEN  TROPONIN I (HIGH SENSITIVITY)     EKG  My interpretation of EKG:  Normal sinus rate of 68 without any ST elevation,  T wave inversion in V2, lead III, QTc of 504  RADIOLOGY I have reviewed the xray personally and interpreted no evidence of any pneumonia.   PROCEDURES:  Critical Care performed: No  .1-3 Lead EKG Interpretation  Performed by: Ernest Ronal BRAVO, MD Authorized by: Ernest Ronal BRAVO, MD     Interpretation: normal     ECG rate:  60   ECG rate assessment: normal     Rhythm: sinus rhythm     Ectopy: none     Conduction: normal      MEDICATIONS ORDERED IN ED: Medications  thiamine  (VITAMIN B1) injection 100 mg (has no administration in time range)  fentaNYL  (SUBLIMAZE ) injection 25 mcg (25 mcg Intravenous Given 07/27/24 0852)  pantoprazole  (PROTONIX ) injection 40 mg (40 mg Intravenous Given 07/27/24 0851)     IMPRESSION / MDM / ASSESSMENT AND PLAN / ED COURSE  I reviewed the triage vital signs and the nursing notes.   Patient's presentation is most consistent with acute presentation with  potential threat to life or bodily function.   Patient comes in with coughing, potentially coughing up blood.  Doubt PE given patient is already on Eliquis  but will get chest x-ray to evaluate for any pneumonia, COVID evaluate for COVID testing.  She is no longer having any continued hemoptysis and she reports it was only a very small amount.  She also reports a little bit of discomfort in her lower abdomen.  Will treat patient's pain with some IV fentanyl  get blood work to evaluate for [employer] abnormalities, AKI.  Troponin is negative.  CBC shows white count around baseline although hemoglobin is down from baseline 8.4.  CMP shows creatinine that is slightly elevated but, similar to her priors.  9:15 AM reevaluated patient.  Rectal exam was performed with small amount of brown stool but Hemoccult positive.  Patient now reporting pain in her chest that is a 6 out of 10.  Given this migratory pain and hypertension will get CT dissection.  Doubt PE given on Eliquis .  I again discussed with patient she denies any headaches, falls hitting her head.    Delay in CT secondary to IV blowing happy to establish a new IV  IMPRESSION: No evidence of aortic aneurysm or dissection. No evidence of pulmonary embolus.   Coronary artery disease, aortic atherosclerosis.   Stable moderate-sized hiatal hernia.   Severe diffuse fatty infiltration of the liver, worsening since prior study.   Cholelithiasis.   Mild free fluid in the abdomen and pelvis.   Areas of wall thickening in the right colon and transverse colon. This could reflect colitis or portal colopathy.   Stable circumferential wall thickening in the region of the rectum. This could reflect proctitis. Recommend clinical correlation. Consider further evaluation with colonoscopy to exclude mass lesion.    12:48 PM discussed with patient and she reports not having a recent colonoscopy.  Her symptoms do not really seem to be consistent with  colitis but I am concerned about her hemoglobin has been downtrending and she is reporting bright red blood per rectum.  Could be hemorrhoids but given she is on Eliquis  and downtrending hemoglobin I did discuss the hospital team for admission for hemoglobin monitoring  The patient is on the cardiac monitor to evaluate for evidence of arrhythmia and/or significant heart rate changes.      FINAL CLINICAL IMPRESSION(S) / ED DIAGNOSES   Final diagnoses:  Gastrointestinal hemorrhage, unspecified gastrointestinal hemorrhage type  Anemia, unspecified type  Rx / DC Orders   ED Discharge Orders     None        Note:  This document was prepared using Dragon voice recognition software and may include unintentional dictation errors.   Ernest Ronal BRAVO, MD 07/27/24 1250

## 2024-07-27 NOTE — Progress Notes (Signed)
 This patient has received 94 ml's of IV Omni 350contrast extravasation into Left AC during a CTA CAP for Dissection.  The exam was performed on 07/27/24 at 935am  Site / affected area assessed by Carlin, Radiology Physician Assistant.  I flushed IV and made sure it flushed in arm up position. Blood return noted. After giving contrast we saw the patient's IV blew.  When we took out patient's IV, we saw it was a short 1 inch catheter instead of long for US  guided IVs.

## 2024-07-27 NOTE — ED Notes (Signed)
 MEDS GIVEN   PT UP TO BATHROOM   PT ALERT.

## 2024-07-27 NOTE — ED Triage Notes (Signed)
 Arrived by [facility] from home. C/o SOB that started at 4:30am. Reports coughing up a blood clot.  Reports emesis last night.  EMS vitals: 100% RA 74HR 180/100 b/p

## 2024-07-27 NOTE — ED Notes (Signed)
 Attempted iv x 2. Not successful. Requested others in Dept to assess, and attempt iv

## 2024-07-27 NOTE — Assessment & Plan Note (Addendum)
 Home nadolol  80 mg daily resume Hydralazine  5 mg IV every 6 hours as needed for SBP under 170, 5 days ordered

## 2024-07-27 NOTE — Assessment & Plan Note (Signed)
 Fall precaution.

## 2024-07-27 NOTE — Hospital Course (Signed)
 Ms. Zianna Dercole is a 68 year old female with history of fatty liver, liver cirrhosis, paroxysmal atrial fibrillation on Eliquis , obesity, hypothyroid, hyperlipidemia, who presents emergency department for chief concerns of shortness of breath and coughing up blood.  Vitals in the ED showed t of 98.4, rr 16, hr of 66, blood pressure 181/99, SpO2 100% on room air.  Serum sodium is 139, potassium 4.2, chloride 103, bicarb 25, BUN of 18, serum creatinine 1.09, eGFR 56, nonfasting glucose 109, WBC 3.3, hemoglobin 8.4, platelets of 112.  BNP was elevated 435.4.  HS troponin was 10 and on repeat is 9.  ED treatment: Fentanyl  25 mcg IV one-time dose, Protonix  40 mg IV one-time dose, thiamine  100 mg IV one-time dose.

## 2024-07-27 NOTE — Progress Notes (Addendum)
  Evaluation after Contrast Extravasation  Patient seen and examined immediately after contrast extravasation while in CT 2 at [facility]. Approximately 100 mL of contrast extravasated.  Exam: There is swelling at the left [facility] area.  There is no erythema. There is no discoloration. There are no blisters. There are no signs of decreased perfusion of the skin.  It is not warm to touch.  The patient has full ROM in fingers.  Radial pulse is normal.  Per contrast extravasation protocol, I have instructed the patient to keep an ice pack on the area for 20-60 minutes at a time for about 48 hours.   Keep arm elevated as much as possible.   The patient understands to call the radiology department if there is: - increase in pain or swelling - changed or altered sensation - ulceration or blistering - increasing redness - warmth or increasing firmness - decreased tissue perfusion as noted by decreased capillary refill or discoloration of skin - decreased pulses peripheral to site   Carlin LABOR Deanza Upperman PA-C 07/27/2024 9:54 AM

## 2024-07-27 NOTE — ED Notes (Signed)
 Pt transported to CT ?

## 2024-07-27 NOTE — Assessment & Plan Note (Signed)
 Patient states she stopped taking atorvastatin  due to it upsetting her stomach

## 2024-07-28 DIAGNOSIS — F1090 Alcohol use, unspecified, uncomplicated: Secondary | ICD-10-CM

## 2024-07-28 DIAGNOSIS — K922 Gastrointestinal hemorrhage, unspecified: Secondary | ICD-10-CM | POA: Diagnosis not present

## 2024-07-28 LAB — BASIC METABOLIC PANEL WITH GFR
Anion gap: 10 (ref 5–15)
BUN: 13 mg/dL (ref 8–23)
CO2: 25 mmol/L (ref 22–32)
Calcium: 8 mg/dL — ABNORMAL LOW (ref 8.9–10.3)
Chloride: 104 mmol/L (ref 98–111)
Creatinine, Ser: 0.87 mg/dL (ref 0.44–1.00)
GFR, Estimated: >60 mL/min (ref >60)
Glucose, Bld: 94 mg/dL (ref 70–99)
Potassium: 3.9 mmol/L (ref 3.5–5.1)
Sodium: 139 mmol/L (ref 135–145)

## 2024-07-28 LAB — HEMOGLOBIN AND HEMATOCRIT, BLOOD
HCT: 25.8 % — ABNORMAL LOW (ref 36.0–46.0)
HCT: 26.8 % — ABNORMAL LOW (ref 36.0–46.0)
Hemoglobin: 7.6 g/dL — ABNORMAL LOW (ref 12.0–15.0)
Hemoglobin: 7.8 g/dL — ABNORMAL LOW (ref 12.0–15.0)

## 2024-07-28 LAB — CBC
HCT: 23.1 % — ABNORMAL LOW (ref 36.0–46.0)
Hemoglobin: 7 g/dL — ABNORMAL LOW (ref 12.0–15.0)
MCH: 24.2 pg — ABNORMAL LOW (ref 26.0–34.0)
MCHC: 30.3 g/dL (ref 30.0–36.0)
MCV: 79.9 fL — ABNORMAL LOW (ref 80.0–100.0)
Platelets: 88 K/uL — ABNORMAL LOW (ref 150–400)
RBC: 2.89 MIL/uL — ABNORMAL LOW (ref 3.87–5.11)
RDW: 28.7 % — ABNORMAL HIGH (ref 11.5–15.5)
WBC: 3.1 K/uL — ABNORMAL LOW (ref 4.0–10.5)
nRBC: 0.6 % — ABNORMAL HIGH (ref 0.0–0.2)

## 2024-07-28 MED ORDER — PEG 3350-KCL-NA BICARB-NACL 420 G PO SOLR
4000.0000 mL | Freq: Once | ORAL | Status: AC
  Administered 2024-07-28: 4000 mL via ORAL
  Filled 2024-07-28: qty 4000

## 2024-07-28 MED ORDER — FOLIC ACID 1 MG PO TABS
1.0000 mg | ORAL_TABLET | Freq: Every day | ORAL | Status: DC
  Administered 2024-07-28 – 2024-08-02 (×6): 1 mg via ORAL
  Filled 2024-07-28 (×6): qty 1

## 2024-07-28 MED ORDER — SODIUM CHLORIDE 0.9 % IV SOLN
50.0000 ug/h | INTRAVENOUS | Status: DC
  Administered 2024-07-28 – 2024-07-29 (×3): 50 ug/h via INTRAVENOUS
  Filled 2024-07-28 (×4): qty 1

## 2024-07-28 MED ORDER — OCTREOTIDE LOAD VIA INFUSION
50.0000 ug | Freq: Once | INTRAVENOUS | Status: AC
  Administered 2024-07-28: 50 ug via INTRAVENOUS
  Filled 2024-07-28: qty 25

## 2024-07-28 MED ORDER — ADULT MULTIVITAMIN W/MINERALS CH
1.0000 | ORAL_TABLET | Freq: Every day | ORAL | Status: DC
  Administered 2024-07-28 – 2024-08-02 (×6): 1 via ORAL
  Filled 2024-07-28 (×6): qty 1

## 2024-07-28 NOTE — ED Notes (Signed)
 Pt up to restroom. Pt reported blood in stool after having a BM.

## 2024-07-28 NOTE — Progress Notes (Signed)
 GI Inpatient Follow-up Note  Subjective:  Patient seen and is hemodynamically stable. No further bleeding. Has been on clear liquids for a day and off her blood thinner since Wednesday.  Scheduled Inpatient Medications:   folic acid   1 mg Oral Daily   levothyroxine   112 mcg Oral Q0600   multivitamin with minerals  1 tablet Oral Daily   nadolol   80 mg Oral Daily   pantoprazole  (PROTONIX ) IV  40 mg Intravenous BID   polyethylene glycol-electrolytes  4,000 mL Oral Once   thiamine   100 mg Oral Daily    Continuous Inpatient Infusions:    octreotide  (SANDOSTATIN ) 500 mcg in sodium chloride  0.9 % 250 mL (2 mcg/mL) infusion 50 mcg/hr (07/28/24 0939)    PRN Inpatient Medications:  acetaminophen  **OR** acetaminophen , hydrALAZINE , ondansetron  **OR** ondansetron  (ZOFRAN ) IV, senna-docusate  Review of Systems:  Review of Systems  Constitutional:  Negative for chills and fever.  Respiratory:  Negative for shortness of breath.   Cardiovascular:  Negative for chest pain.  Gastrointestinal:  Negative for abdominal pain, constipation, melena, nausea and vomiting.  Musculoskeletal:  Positive for joint pain.  Neurological:  Negative for focal weakness.  Psychiatric/Behavioral:  Positive for substance abuse.       Physical Examination: BP (!) 153/84 (BP Location: Left Arm)   Pulse 64   Temp 98.2 F (36.8 C) (Oral)   Resp 15   Ht 5' 4 (1.626 m)   Wt 83 kg   SpO2 100%   BMI 31.41 kg/m  Gen: NAD, alert and oriented x 4 HEENT: PEERLA, EOMI, Neck: supple, no JVD or thyromegaly Chest: No respiratory distress Abd: soft, non-tender, non-distended Ext: no edema, well perfused with 2+ pulses, Skin: no rash or lesions noted Lymph: no LAD  Data: Lab Results  Component Value Date   WBC 3.1 (L) 07/28/2024   HGB 7.6 (L) 07/28/2024   HCT 25.8 (L) 07/28/2024   MCV 79.9 (L) 07/28/2024   PLT 88 (L) 07/28/2024   Recent Labs  Lab 07/27/24 0732 07/28/24 0501 07/28/24 1535  HGB 8.4* 7.0*  7.6*   Lab Results  Component Value Date   NA 139 07/28/2024   K 3.9 07/28/2024   CL 104 07/28/2024   CO2 25 07/28/2024   BUN 13 07/28/2024   CREATININE 0.87 07/28/2024   Lab Results  Component Value Date   ALT 43 07/27/2024   AST 153 (H) 07/27/2024   ALKPHOS 122 07/27/2024   BILITOT 1.1 07/27/2024   Recent Labs  Lab 07/27/24 0732  APTT 36  INR 1.4*   Assessment/Plan: Ms. Ignasiak is a 68 y.o. lady with alcohol cirrhosis who presented with small volume hematemesis and small volume hematochezia precipitated by a coughing fit. Has history of varices but none seen on most recent endoscopy. Hemodynamically stable. Given history will proceed with EGD/Colonoscopy. Suspect hemorrhoids and portal hypertensive gastropathy.  Recommendations:  - continue clear liquids - NPO at midnight - ok to continue octreotide  - continue PPI IV BID - transfuse to keep hemoglobin > 7 - hold any heparin  DVT prophylaxis - alcohol cessation counseling provided - further recs after procedures tomorrow  Please call with any questions or concerns.  Frederic Schick MD, MPH [facility] GI

## 2024-07-28 NOTE — TOC Initial Note (Signed)
 Transition of Care [facility]) - Initial/Assessment Note    Patient Details  Name: Kathryn [name] MRN: [id number] Date of Birth: 19-May-1956  Transition of [facility]) CM/SW Contact:    [place] L Shiah Berhow, LCSW Phone Number: 07/28/2024, 10:46 AM  Clinical Narrative:                  [facility] consult received for Substance Abuse education/counseling. Resources were uploaded to the AVS.   TOC will continue to follow patient through discharge. Please enter a consult when needed.        Patient Goals and CMS Choice            Expected Discharge Plan and Services                                              Prior Living Arrangements/Services                       Activities of Daily Living      Permission Sought/Granted                  Emotional Assessment              Admission diagnosis:  Cough with hemoptysis [R04.2] Patient Active Problem List   Diagnosis Date Noted   Cough with hemoptysis 07/27/2024   Hyperlipemia 07/27/2024   AKI (acute kidney injury) (HCC) 02/22/2024   Alcohol use 02/22/2024   Demand ischemia (HCC) 02/21/2024   Atrial fibrillation with rapid ventricular response (HCC) 02/18/2024   NSTEMI (non-ST elevated myocardial infarction) (HCC) 02/18/2024   Alcoholic cirrhosis of liver (HCC)    Nausea vomiting and diarrhea 07/28/2023   Nausea and vomiting 07/28/2023   Colitis 07/27/2023   High anion gap metabolic acidosis 07/27/2023   Alcohol use disorder 12/02/2022   Sarcoid 12/02/2022   Hypokalemia 01/05/2022   Hypomagnesemia 01/05/2022   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   Physical deconditioning 01/04/2022   Cholelithiasis without cholecystitis 01/03/2022   Thrombocytopenia (HCC) 01/03/2022   Aortic atherosclerosis (HCC)    Acute pancreatitis 01/02/2022   Symptomatic anemia 06/24/2021   Hypertensive urgency 06/24/2021   Asthma 06/24/2021   CKD (chronic kidney disease), stage IIIa 06/24/2021   Chronic CHF (HCC) 06/24/2021    Pancytopenia (HCC) 06/24/2021   Community acquired pneumonia 11/20/2019   Pneumonia due to COVID-19 virus 11/20/2019   Anemia 06/12/2016   Hypoglycemia 02/11/2016   Hematemesis 04/04/2015   Cirrhosis (HCC) 04/04/2015   Esophageal varices (HCC) 04/04/2015   Hypothyroidism 04/04/2015   Hypertension 04/04/2015   Chest pain 04/04/2015   PCP:  Odell Tor Edra CINDERELLA, MD Pharmacy:   [facility] [address]), [place] - [address] [place]) [place] [address] Phone: [phone] Fax: [phone]     Social Drivers of Health (SDOH) Social History: SDOH Screenings   Food Insecurity: No Food Insecurity (02/18/2024)  Recent Concern: Food Insecurity - Food Insecurity Present (12/08/2023)  Housing: Low Risk  (02/18/2024)  Transportation Needs: No Transportation Needs (02/18/2024)  Utilities: Not At Risk (02/18/2024)  Depression (PHQ2-9): Low Risk  (12/08/2023)  Social Connections: Unknown (02/18/2024)  Tobacco Use: Low Risk  (07/27/2024)   SDOH Interventions:     Readmission Risk Interventions    12/03/2022   12:32 PM  Readmission Risk Prevention Plan  Transportation Screening Complete  PCP  or Specialist Appt within 3-5 Days Complete  Social Work Consult for Recovery Care Planning/Counseling Complete  Palliative Care Screening Not Applicable  Medication Review [employer]ographer) Complete

## 2024-07-28 NOTE — Progress Notes (Addendum)
 Progress Note   Patient: Kathryn [name] FMW:[id number] DOB: 1956-11-06 DOA: 07/27/2024     1 DOS: the patient was seen and examined on 07/28/2024   Brief hospital course: [name] is a 68 year old female with history of fatty liver, liver cirrhosis, paroxysmal atrial fibrillation on Eliquis , obesity, hypothyroid, hyperlipidemia, who presents emergency department for chief concerns of shortness of breath and coughing up blood.  Vitals in the ED showed t of 98.4, rr 16, hr of 66, blood pressure 181/99, SpO2 100% on room air.  Serum sodium is 139, potassium 4.2, chloride 103, bicarb 25, BUN of 18, serum creatinine 1.09, eGFR 56, nonfasting glucose 109, WBC 3.3, hemoglobin 8.4, platelets of 112.  BNP was elevated 435.4.  HS troponin was 10 and on repeat is 9.  ED treatment: Fentanyl  25 mcg IV one-time dose, Protonix  40 mg IV one-time dose, thiamine  100 mg IV one-time dose.  Assessment and Plan: Hematemesis BRBPR Hemodynamically stable.   Hemoglobin decreased on a.m. labs.   Likely Mallory-Weiss tear for UGIB, possible hemorrhoid v diverticular bleed for LGIB.  Patient has history of variceal bleed.  Start somatostatin Continue IV PPI Trend H&H F/u GI recommendations   History of alcoholic cirrhosis complicated by esophageal varices Patient has had banding of esophageal varices in the past. Management of hematemesis per above Appears compensated   Hypertension On nadolol  80 mg daily at home for esophageal varices. Continue this medication     07/28/2024    2:00 PM 07/28/2024    1:30 PM 07/28/2024    1:00 PM  Vitals with BMI  Systolic 148 141 848  Diastolic 72 79 82  Pulse 63  64    Hyperlipidemia Patient states she stopped taking atorvastatin  due to it upsetting her stomach   Hypothyroidism Home levothyroxine  112 mcg daily resumed   Alcohol use disorder  Severe alcohol d/o.  Naltrexone has been tried in the past. -Will consider acamprosate     Pafib  NSR. On  eliquis . Hold this in the setting of GIB.    Subjective: Patient states that she had bright red blood per rectum approximate 2 days ago.  She stated that on the day of admission she was vomiting multiple times, it was mostly mucousy..  She then had an episode with bright red emesis.  She states that she has not had a another episode of emesis since being hospitalized.  She did have 2 bowel movements this a.m. the first had bright red blood but the second had no blood.  Patient does note that she drinks 3-4 alcoholic beverages a day, (about 4 oz of liquor in each drink). She did decrease her alcohol intake over the past week to about 2 drinks a day.  She states she has had a variceal bleed in the past. She has not had a alcohol withdrawal seizure.   Physical Exam: Vitals:   07/28/24 1230 07/28/24 1300 07/28/24 1330 07/28/24 1400  BP: 129/75 (!) 151/82 (!) 141/79 (!) 148/72  Pulse: (!) 59 64  63  Resp: 19 (!) 21 19 (!) 25  Temp:      TempSrc:      SpO2: 100% 100%  100%  Weight:      Height:       Physical Exam  Constitutional: In no distress.  No jaundice  Cardiovascular: Normal rate, regular rhythm. No lower extremity edema  Pulmonary: Non labored breathing on room air, no wheezing or rales.   Abdominal: Soft. Non distended and non tender  Musculoskeletal: Normal range of motion.     Neurological: Alert and oriented to person, place, and time. Non focal. No asterixis Skin: Skin is warm and dry.   Data Reviewed:     Latest Ref Rng & Units 07/28/2024    5:01 AM 07/27/2024    7:32 AM 06/07/2024    8:58 AM  CBC  WBC 4.0 - 10.5 K/uL 3.1  3.3  4.3   Hemoglobin 12.0 - 15.0 g/dL 7.0  8.4  9.4   Hematocrit 36.0 - 46.0 % 23.1  28.1  32.2   Platelets 150 - 400 K/uL 88  112  200    Hgb 8.4>>> 7 Plts 112>> 88    Latest Ref Rng & Units 07/28/2024    5:01 AM 07/27/2024    7:32 AM 06/07/2024   12:26 PM  BMP  Glucose 70 - 99 mg/dL 94  890  88   BUN 8 - 23 mg/dL 13  18  13    Creatinine 0.44 -  1.00 mg/dL 9.12  8.90  9.20   Sodium 135 - 145 mmol/L 139  139  139   Potassium 3.5 - 5.1 mmol/L 3.9  4.2  4.0   Chloride 98 - 111 mmol/L 104  103  104   CO2 22 - 32 mmol/L 25  25  23    Calcium  8.9 - 10.3 mg/dL 8.0  8.4  9.3      Family Communication: None at bedside. Patient verbalized understanding of the plan and had no questions.   Disposition: Status is: Inpatient Remains inpatient appropriate because: The management of GI bleed  Planned Discharge Destination: Home    Time spent: 35 minutes  Author: Alban Pepper, MD 07/28/2024 3:17 PM  For on call review [email or web address].

## 2024-07-29 ENCOUNTER — Inpatient Hospital Stay: Admitting: Anesthesiology

## 2024-07-29 ENCOUNTER — Encounter
Admission: EM | Disposition: A | Discharge status: Home Health | Payer: Self-pay | Source: Home / Self Care | Attending: Student

## 2024-07-29 DIAGNOSIS — K922 Gastrointestinal hemorrhage, unspecified: Secondary | ICD-10-CM | POA: Diagnosis not present

## 2024-07-29 DIAGNOSIS — F1090 Alcohol use, unspecified, uncomplicated: Secondary | ICD-10-CM | POA: Diagnosis not present

## 2024-07-29 HISTORY — PX: COLONOSCOPY: SHX5424

## 2024-07-29 HISTORY — PX: ESOPHAGOGASTRODUODENOSCOPY: SHX5428

## 2024-07-29 LAB — HEMOGLOBIN AND HEMATOCRIT, BLOOD
HCT: 28.8 % — ABNORMAL LOW (ref 36.0–46.0)
HCT: 23.7 % — ABNORMAL LOW (ref 36.0–46.0)
HCT: 27.6 % — ABNORMAL LOW (ref 36.0–46.0)
HCT: 26.2 % — ABNORMAL LOW (ref 36.0–46.0)
Hemoglobin: 8.6 g/dL — ABNORMAL LOW (ref 12.0–15.0)
Hemoglobin: 7.1 g/dL — ABNORMAL LOW (ref 12.0–15.0)
Hemoglobin: 8 g/dL — ABNORMAL LOW (ref 12.0–15.0)
Hemoglobin: 7.8 g/dL — ABNORMAL LOW (ref 12.0–15.0)

## 2024-07-29 SURGERY — EGD (ESOPHAGOGASTRODUODENOSCOPY)
Anesthesia: General

## 2024-07-29 MED ORDER — EPHEDRINE SULFATE (PRESSORS) 50 MG/ML IJ SOLN
INTRAMUSCULAR | Status: DC | PRN
  Administered 2024-07-29: 5 mg via INTRAVENOUS
  Administered 2024-07-29: 10 mg via INTRAVENOUS

## 2024-07-29 MED ORDER — DEXMEDETOMIDINE HCL IN NACL 80 MCG/20ML IV SOLN
INTRAVENOUS | Status: DC | PRN
  Administered 2024-07-29: 4 ug via INTRAVENOUS

## 2024-07-29 MED ORDER — PROPOFOL 500 MG/50ML IV EMUL
INTRAVENOUS | Status: DC | PRN
  Administered 2024-07-29: 140 ug/kg/min via INTRAVENOUS

## 2024-07-29 MED ORDER — LIDOCAINE HCL (CARDIAC) PF 100 MG/5ML IV SOSY
PREFILLED_SYRINGE | INTRAVENOUS | Status: DC | PRN
  Administered 2024-07-29: 60 mg via INTRAVENOUS

## 2024-07-29 MED ORDER — PROPOFOL 10 MG/ML IV BOLUS
INTRAVENOUS | Status: DC | PRN
  Administered 2024-07-29: 20 mg via INTRAVENOUS
  Administered 2024-07-29: 60 mg via INTRAVENOUS

## 2024-07-29 MED ORDER — PANTOPRAZOLE SODIUM 40 MG PO TBEC
40.0000 mg | DELAYED_RELEASE_TABLET | Freq: Every day | ORAL | Status: DC
  Administered 2024-07-30 – 2024-08-02 (×4): 40 mg via ORAL
  Filled 2024-07-29 (×4): qty 1

## 2024-07-29 NOTE — Progress Notes (Signed)
 Progress Note   Patient: Kathryn [name] FMW:[id number] DOB: 07/24/1956 DOA: 07/27/2024     2 DOS: the patient was seen and examined on 07/29/2024   Brief hospital course: Ms. Yolunda Kloos is a 68 year old female with history of fatty liver, liver cirrhosis, paroxysmal atrial fibrillation on Eliquis , obesity, hypothyroid, hyperlipidemia, who presents emergency department for chief concerns of shortness of breath and coughing up blood.  Vitals in the ED showed t of 98.4, rr 16, hr of 66, blood pressure 181/99, SpO2 100% on room air.  Serum sodium is 139, potassium 4.2, chloride 103, bicarb 25, BUN of 18, serum creatinine 1.09, eGFR 56, nonfasting glucose 109, WBC 3.3, hemoglobin 8.4, platelets of 112.  BNP was elevated 435.4.  HS troponin was 10 and on repeat is 9.  ED treatment: Fentanyl  25 mcg IV one-time dose, Protonix  40 mg IV one-time dose, thiamine  100 mg IV one-time dose.  Assessment and Plan: Hematemesis BRBPR NO further bleeding.     Latest Ref Rng & Units 07/29/2024    4:19 PM 07/29/2024   12:39 PM 07/29/2024    7:33 AM  CBC  Hemoglobin 12.0 - 15.0 g/dL 8.0  7.1  8.6   Hematocrit 36.0 - 46.0 % 27.6  23.7  28.8    Hgb stable.   Had EGD and colonoscopy felt hematemesis due to portal gastropathy  Hemorrhoids noted on colonoscopy  Stop somatostatin Continue ppi daily  Reg diet  Discussed alcohol cessation. Fiber diet   History of alcoholic cirrhosis complicated by esophageal varices Patient has had banding of esophageal varices in the past. Management of hematemesis per above Appears compensated   Hypertension On nadolol  80 mg daily at home for esophageal varices. Continue this medication     07/29/2024    3:00 PM 07/29/2024   11:25 AM 07/29/2024   11:00 AM  Vitals with BMI  Systolic 152 149 857  Diastolic 84 67 81  Pulse 58 56 56    Hyperlipidemia Patient states she stopped taking atorvastatin  due to it upsetting her stomach   Hypothyroidism Home levothyroxine  112  mcg daily   Alcohol use disorder  Severe alcohol d/o.  Naltrexone  has been tried in the past. -Will consider acamprosate     Pafib  NSR. On eliquis . Hold this in the setting of GIB.    Subjective:  No further bleeding per patient. Did not see blood in stool when taking colonoscopy prep.   Physical Exam: Vitals:   07/29/24 1047 07/29/24 1100 07/29/24 1125 07/29/24 1500  BP: 107/77 (!) 142/81 (!) 149/67 (!) 152/84  Pulse: 67 (!) 56 (!) 56 (!) 58  Resp: 11 15    Temp:   97.7 F (36.5 C) 98.2 F (36.8 C)  TempSrc:    Oral  SpO2: 92% 97% 100% 100%  Weight:      Height:        Physical Exam  Constitutional: In no distress.  Cardiovascular: Normal rate, regular rhythm. No lower extremity edema  Pulmonary: Non labored breathing on room air, no wheezing or rales.   Abdominal: Soft. Non distended and non tender Musculoskeletal: Normal range of motion.     Neurological: Alert and oriented to person, place, and time. Non focal  Skin: Skin is warm and dry.    Data Reviewed:     Latest Ref Rng & Units 07/29/2024    4:19 PM 07/29/2024   12:39 PM 07/29/2024    7:33 AM  CBC  Hemoglobin 12.0 - 15.0 g/dL 8.0  7.1  8.6   Hematocrit 36.0 - 46.0 % 27.6  23.7  28.8    Hgb 8.4>>> 7 Plts 112>> 88    Latest Ref Rng & Units 07/28/2024    5:01 AM 07/27/2024    7:32 AM 06/07/2024   12:26 PM  BMP  Glucose 70 - 99 mg/dL 94  890  88   BUN 8 - 23 mg/dL 13  18  13    Creatinine 0.44 - 1.00 mg/dL 9.12  8.90  9.20   Sodium 135 - 145 mmol/L 139  139  139   Potassium 3.5 - 5.1 mmol/L 3.9  4.2  4.0   Chloride 98 - 111 mmol/L 104  103  104   CO2 22 - 32 mmol/L 25  25  23    Calcium  8.9 - 10.3 mg/dL 8.0  8.4  9.3      Family Communication: None at bedside. Patient verbalized understanding of the plan and had no questions.   Disposition: Status is: Inpatient Remains inpatient appropriate because: The management of GI bleed  Planned Discharge Destination: Home    Time spent: 35  minutes  Author: Alban Pepper, MD 07/29/2024 7:27 PM  For on call review [email or web address].

## 2024-07-29 NOTE — Anesthesia Postprocedure Evaluation (Signed)
 Anesthesia Post Note  Patient: Kathryn [name]  Procedure(s) Performed: EGD (ESOPHAGOGASTRODUODENOSCOPY) COLONOSCOPY  Patient location during evaluation: PACU Anesthesia Type: General Level of consciousness: awake and alert Pain management: pain level controlled Vital Signs Assessment: post-procedure vital signs reviewed and stable Respiratory status: spontaneous breathing, nonlabored ventilation, respiratory function stable and patient connected to nasal cannula oxygen Cardiovascular status: blood pressure returned to baseline and stable Postop Assessment: no apparent nausea or vomiting Anesthetic complications: no   No notable events documented.   Last Vitals:  Vitals:   07/29/24 1047 07/29/24 1100  BP: 107/77 (!) 142/81  Pulse: 67 (!) 56  Resp: 11 15  Temp:    SpO2: 92% 97%    Last Pain:  Vitals:   07/29/24 1100  TempSrc:   PainSc: 0-No pain                 Lendia LITTIE Mae

## 2024-07-29 NOTE — Op Note (Signed)
 [facility] Patient Name: Kathryn [name] Procedure Date: 07/29/2024 9:23 AM MRN: [id number] Account #: [id number] Date of Birth: 1956-09-05 Admit Type: Inpatient Age: 68 Room: [facility] ENDO ROOM 4 Gender: Female Note Status: Finalized Instrument Name: Colon Scope [id number] Procedure:             Colonoscopy Indications:           Abnormal CT of the GI tract Providers:             Ole Schick MD, MD Referring MD:          No Local Md, MD (Referring MD) Medicines:             Monitored Anesthesia Care Complications:         No immediate complications. Procedure:             Pre-Anesthesia Assessment:                        - Prior to the procedure, a History and Physical was                         performed, and patient medications and allergies were                         reviewed. The patient is competent. The risks and                         benefits of the procedure and the sedation options and                         risks were discussed with the patient. All questions                         were answered and informed consent was obtained.                         Patient identification and proposed procedure were                         verified by the physician, the nurse, the                         anesthesiologist, the anesthetist and the technician                         in the endoscopy suite. Mental Status Examination:                         alert and oriented. Airway Examination: normal                         oropharyngeal airway and neck mobility. Respiratory                         Examination: clear to auscultation. CV Examination:                         normal. Prophylactic Antibiotics: The patient does not  require prophylactic antibiotics. Prior                         Anticoagulants: The patient has taken no anticoagulant                         or antiplatelet agents. ASA Grade Assessment: III - A                          patient with severe systemic disease. After reviewing                         the risks and benefits, the patient was deemed in                         satisfactory condition to undergo the procedure. The                         anesthesia plan was to use monitored anesthesia care                         (MAC). Immediately prior to administration of                         medications, the patient was re-assessed for adequacy                         to receive sedatives. The heart rate, respiratory                         rate, oxygen saturations, blood pressure, adequacy of                         pulmonary ventilation, and response to care were                         monitored throughout the procedure. The physical                         status of the patient was re-assessed after the                         procedure.                        After obtaining informed consent, the colonoscope was                         passed under direct vision. Throughout the procedure,                         the patient's blood pressure, pulse, and oxygen                         saturations were monitored continuously. The                         Colonoscope was introduced through the anus and  advanced to the the terminal ileum, with                         identification of the appendiceal orifice and IC                         valve. The colonoscopy was performed without                         difficulty. The patient tolerated the procedure well.                         The quality of the bowel preparation was fair. The                         terminal ileum, ileocecal valve, appendiceal orifice,                         and rectum were photographed. Findings:      The perianal and digital rectal examinations were normal.      The terminal ileum appeared normal.      Internal hemorrhoids were found during retroflexion and during       endoscopy. The  hemorrhoids were Grade III (internal hemorrhoids that       prolapse but require manual reduction).      The exam was otherwise without abnormality on direct and retroflexion       views. Impression:            - Preparation of the colon was fair.                        - The examined portion of the ileum was normal.                        - Internal hemorrhoids.                        - The examination was otherwise normal on direct and                         retroflexion views.                        - No specimens collected. Recommendation:        - Return patient to hospital ward for ongoing care.                        - Resume previous diet.                        - Continue present medications.                        - Given fair prep, colonoscopy will not count as                         screening but large lesions noted. Procedure Code(s):     --- Professional ---  54621, Colonoscopy, flexible; diagnostic, including                         collection of specimen(s) by brushing or washing, when                         performed (separate procedure) Diagnosis Code(s):     --- Professional ---                        K64.2, Third degree hemorrhoids                        R93.3, Abnormal findings on diagnostic imaging of                         other parts of digestive tract CPT copyright 2022 [employer] Association. All rights reserved. The codes documented in this report are preliminary and upon coder review may  be revised to meet current compliance requirements. Ole Schick MD, MD 07/29/2024 11:28:33 AM Number of Addenda: 0 Note Initiated On: 07/29/2024 9:23 AM Scope Withdrawal Time: 0 hours 5 minutes 35 seconds  Total Procedure Duration: 0 hours 9 minutes 38 seconds  Estimated Blood Loss:  Estimated blood loss: none.      [facility]

## 2024-07-29 NOTE — Anesthesia Preprocedure Evaluation (Addendum)
 Anesthesia Evaluation  Patient identified by MRN, date of birth, ID band Patient awake    Reviewed: Allergy & Precautions, NPO status , Patient's Chart, lab work & pertinent test results  History of Anesthesia Complications Negative for: history of anesthetic complications  Airway Mallampati: III  TM Distance: >3 FB Neck ROM: full    Dental  (+) Edentulous Upper, Edentulous Lower   Pulmonary asthma    Pulmonary exam normal        Cardiovascular hypertension, On Medications pulmonary hypertension+ CAD, + Past MI and +CHF  + dysrhythmias Atrial Fibrillation (-) pacemaker+ Valvular Problems/Murmurs MR  Rhythm:Regular Rate:Normal  ECHO 2025 IMPRESSIONS     1. Left ventricular ejection fraction, by estimation, is 65 to 70%. The  left ventricle has normal function. The left ventricle has no regional  wall motion abnormalities. There is severe asymmetric left ventricular  hypertrophy of the basal-septal  segment. Left ventricular diastolic parameters are indeterminate.   2. Right ventricular systolic function is normal. The right ventricular  size is normal. There is moderately elevated pulmonary artery systolic  pressure. The estimated right ventricular systolic pressure is 49.8 mmHg.   3. Left atrial size was moderately dilated.   4. Right atrial size was mildly dilated.   5. The mitral valve is abnormal. Moderate mitral valve regurgitation. No  evidence of mitral stenosis.   6. Tricuspid valve regurgitation is mild to moderate.   7. The aortic valve is tricuspid. There is mild calcification of the  aortic valve. There is mild thickening of the aortic valve. Aortic valve  regurgitation is not visualized. Aortic valve sclerosis is present, with  no evidence of aortic valve stenosis.   8. The inferior vena cava is normal in size with greater than 50%  respiratory variability, suggesting right atrial pressure of 3 mmHg.       Neuro/Psych negative neurological ROS  negative psych ROS   GI/Hepatic negative GI ROS,,,(+) Cirrhosis   Esophageal Varices  substance abuse  alcohol usePortal hypertension   Endo/Other  Hypothyroidism    Renal/GU CRF and ARFRenal disease  negative genitourinary   Musculoskeletal   Abdominal   Peds  Hematology  (+) Blood dyscrasia, anemia   Anesthesia Other Findings Past Medical History: No date: Alcoholic cirrhosis of liver (HCC) No date: Asthma No date: CHF (congestive heart failure) (HCC) No date: Chronic disease anemia No date: Esophageal varices (HCC)     Comment:  GR I on EGD by Dr Jeri 09/2014 No date: ETOH abuse No date: Hypertension No date: Hypothyroidism No date: Murmur No date: Portal hypertension (HCC) No date: Sarcoidosis  Past Surgical History: 06/26/2021: ESOPHAGOGASTRODUODENOSCOPY; N/A     Comment:  Procedure: ESOPHAGOGASTRODUODENOSCOPY (EGD);  Surgeon:               Toledo, Ladell POUR, MD;  Location: [facility] ENDOSCOPY;                Service: Gastroenterology;  Laterality: N/A; 04/04/2015: ESOPHAGOGASTRODUODENOSCOPY (EGD) WITH PROPOFOL ; N/A     Comment:  Procedure: ESOPHAGOGASTRODUODENOSCOPY (EGD) WITH               PROPOFOL ;  Surgeon: Rogelia Copping, MD;  Location: [facility]               ENDOSCOPY;  Service: Endoscopy;  Laterality: N/A;  Dr               COPPING prefers 1445 12/04/2022: ESOPHAGOGASTRODUODENOSCOPY (EGD) WITH PROPOFOL ; N/A     Comment:  Procedure: ESOPHAGOGASTRODUODENOSCOPY (EGD)  WITH               PROPOFOL ;  Surgeon: Therisa Bi, MD;  Location: [facility]               ENDOSCOPY;  Service: Gastroenterology;  Laterality: N/A; 09/2014: ESOPHAGOGASTRODUODENOSCOPY W/ BANDING     Comment:  Rein 02/21/2024: LEFT HEART CATH AND CORONARY ANGIOGRAPHY; N/A     Comment:  Procedure: LEFT HEART CATH AND CORONARY ANGIOGRAPHY;                Surgeon: Mady Bruckner, MD;  Location: [facility] INVASIVE               CV LAB;  Service: Cardiovascular;  Laterality:  N/A; No date: TUBAL LIGATION  BMI    Body Mass Index: 31.41 kg/m      Reproductive/Obstetrics negative OB ROS                              Anesthesia Physical Anesthesia Plan  ASA: 4  Anesthesia Plan: General   Post-op Pain Management: Minimal or no pain anticipated   Induction: Intravenous  PONV Risk Score and Plan: 2 and Propofol  infusion and TIVA  Airway Management Planned: Natural Airway and Nasal Cannula  Additional Equipment:   Intra-op Plan:   Post-operative Plan:   Informed Consent: I have reviewed the patients History and Physical, chart, labs and discussed the procedure including the risks, benefits and alternatives for the proposed anesthesia with the patient or authorized representative who has indicated his/her understanding and acceptance.     Dental Advisory Given  Plan Discussed with: Anesthesiologist, CRNA and Surgeon  Anesthesia Plan Comments: (Patient consented for risks of anesthesia including but not limited to:  - adverse reactions to medications - risk of airway placement if required - damage to eyes, teeth, lips or other oral mucosa - nerve damage due to positioning  - sore throat or hoarseness - Damage to heart, brain, nerves, lungs, other parts of body or loss of life  Patient voiced understanding and assent.)         Anesthesia Quick Evaluation

## 2024-07-29 NOTE — Plan of Care (Signed)

## 2024-07-29 NOTE — Op Note (Signed)
 [facility] Gastroenterology Patient Name: Kathryn [name] Procedure Date: 07/29/2024 9:25 AM MRN: [id number] Account #: [id number] Date of Birth: 06/22/1956 Admit Type: Inpatient Age: 68 Room: [facility] ENDO ROOM 4 Gender: Female Note Status: Finalized Instrument Name: Upper GI Scope [id number] Procedure:             Upper GI endoscopy Indications:           Hematemesis Providers:             Ole Schick MD, MD Referring MD:          No Local Md, MD (Referring MD) Medicines:             Monitored Anesthesia Care Complications:         No immediate complications. Procedure:             Pre-Anesthesia Assessment:                        - Prior to the procedure, a History and Physical was                         performed, and patient medications and allergies were                         reviewed. The patient is competent. The risks and                         benefits of the procedure and the sedation options and                         risks were discussed with the patient. All questions                         were answered and informed consent was obtained.                         Patient identification and proposed procedure were                         verified by the physician, the nurse, the                         anesthesiologist, the anesthetist and the technician                         in the endoscopy suite. Mental Status Examination:                         alert and oriented. Airway Examination: normal                         oropharyngeal airway and neck mobility. Respiratory                         Examination: clear to auscultation. CV Examination:                         normal. Prophylactic Antibiotics: The patient does not  require prophylactic antibiotics. Prior                         Anticoagulants: The patient has taken no anticoagulant                         or antiplatelet agents. ASA Grade Assessment: III - A                          patient with severe systemic disease. After reviewing                         the risks and benefits, the patient was deemed in                         satisfactory condition to undergo the procedure. The                         anesthesia plan was to use monitored anesthesia care                         (MAC). Immediately prior to administration of                         medications, the patient was re-assessed for adequacy                         to receive sedatives. The heart rate, respiratory                         rate, oxygen saturations, blood pressure, adequacy of                         pulmonary ventilation, and response to care were                         monitored throughout the procedure. The physical                         status of the patient was re-assessed after the                         procedure.                        After obtaining informed consent, the endoscope was                         passed under direct vision. Throughout the procedure,                         the patient's blood pressure, pulse, and oxygen                         saturations were monitored continuously. The Endoscope                         was introduced through the mouth, and advanced to the  second part of duodenum. The upper GI endoscopy was                         accomplished without difficulty. The patient tolerated                         the procedure well. Findings:      Grade I varices were found in the lower third of the esophagus. No signs       of recent bleeding or high risk stigmata.      Moderate portal hypertensive gastropathy was found in the gastric fundus       and in the gastric body.      The examined duodenum was normal. Impression:            - Grade I esophageal varices.                        - Portal hypertensive gastropathy.                        - Normal examined duodenum.                        - No specimens  collected. Recommendation:        - Resume previous diet.                        - Continue present medications.                        - Return patient to hospital ward for ongoing care.                        - Refer to a gastroenterologist.                        - Suspect blood was from stomach/portal hypertensive                         gastropathy Procedure Code(s):     --- Professional ---                        [id number], Esophagogastroduodenoscopy, flexible,                         transoral; diagnostic, including collection of                         specimen(s) by brushing or washing, when performed                         (separate procedure) Diagnosis Code(s):     --- Professional ---                        I85.00, Esophageal varices without bleeding                        K76.6, Portal hypertension                        K31.89, Other diseases of stomach  and duodenum                        K92.0, Hematemesis CPT copyright 2022 [employer]. All rights reserved. The codes documented in this report are preliminary and upon coder review may  be revised to meet current compliance requirements. Ole Schick MD, MD 07/29/2024 11:25:36 AM Number of Addenda: 0 Note Initiated On: 07/29/2024 9:25 AM Estimated Blood Loss:  Estimated blood loss: none.      [facility]

## 2024-07-29 NOTE — Transfer of Care (Signed)
 Immediate Anesthesia Transfer of Care Note  Patient: Kathryn [name]  Procedure(s) Performed: EGD (ESOPHAGOGASTRODUODENOSCOPY) COLONOSCOPY  Patient Location: PACU  Anesthesia Type:General  Level of Consciousness: awake, alert , and oriented  Airway & Oxygen Therapy: Patient Spontanous Breathing  Post-op Assessment: Report given to RN and Post -op Vital signs reviewed and stable  Post vital signs: Reviewed and stable  Last Vitals:  Vitals Value Taken Time  BP    Temp    Pulse 68 07/29/24 10:45  Resp 11 07/29/24 10:45  SpO2 89 % 07/29/24 10:45  Vitals shown include unfiled device data.  Last Pain:  Vitals:   07/29/24 0922  TempSrc: Temporal  PainSc:       Patients Stated Pain Goal: 0 (07/28/24 2000)  Complications: No notable events documented.

## 2024-07-29 NOTE — Plan of Care (Signed)

## 2024-07-30 DIAGNOSIS — K922 Gastrointestinal hemorrhage, unspecified: Secondary | ICD-10-CM | POA: Diagnosis not present

## 2024-07-30 DIAGNOSIS — F1090 Alcohol use, unspecified, uncomplicated: Secondary | ICD-10-CM | POA: Diagnosis not present

## 2024-07-30 LAB — CBC
HCT: 22.5 % — ABNORMAL LOW (ref 36.0–46.0)
HCT: 27 % — ABNORMAL LOW (ref 36.0–46.0)
Hemoglobin: 6.8 g/dL — ABNORMAL LOW (ref 12.0–15.0)
Hemoglobin: 8 g/dL — ABNORMAL LOW (ref 12.0–15.0)
MCH: 24.3 pg — ABNORMAL LOW (ref 26.0–34.0)
MCH: 24.2 pg — ABNORMAL LOW (ref 26.0–34.0)
MCHC: 30.2 g/dL (ref 30.0–36.0)
MCHC: 29.6 g/dL — ABNORMAL LOW (ref 30.0–36.0)
MCV: 80.4 fL (ref 80.0–100.0)
MCV: 81.6 fL (ref 80.0–100.0)
Platelets: 101 K/uL — ABNORMAL LOW (ref 150–400)
Platelets: 120 K/uL — ABNORMAL LOW (ref 150–400)
RBC: 2.8 MIL/uL — ABNORMAL LOW (ref 3.87–5.11)
RBC: 3.31 MIL/uL — ABNORMAL LOW (ref 3.87–5.11)
RDW: 28.8 % — ABNORMAL HIGH (ref 11.5–15.5)
RDW: 29.2 % — ABNORMAL HIGH (ref 11.5–15.5)
WBC: 2.7 K/uL — ABNORMAL LOW (ref 4.0–10.5)
WBC: 3.1 K/uL — ABNORMAL LOW (ref 4.0–10.5)
nRBC: 0.7 % — ABNORMAL HIGH (ref 0.0–0.2)
nRBC: 0 % (ref 0.0–0.2)

## 2024-07-30 LAB — IRON AND TIBC
Iron: 23 ug/dL — ABNORMAL LOW (ref 28–170)
Saturation Ratios: 6 % — ABNORMAL LOW (ref 10.4–31.8)
TIBC: 402 ug/dL (ref 250–450)
UIBC: 379 ug/dL

## 2024-07-30 LAB — BASIC METABOLIC PANEL WITH GFR
Anion gap: 8 (ref 5–15)
BUN: 12 mg/dL (ref 8–23)
CO2: 25 mmol/L (ref 22–32)
Calcium: 8.3 mg/dL — ABNORMAL LOW (ref 8.9–10.3)
Chloride: 106 mmol/L (ref 98–111)
Creatinine, Ser: 1.07 mg/dL — ABNORMAL HIGH (ref 0.44–1.00)
GFR, Estimated: 57 mL/min — ABNORMAL LOW (ref >60)
Glucose, Bld: 126 mg/dL — ABNORMAL HIGH (ref 70–99)
Potassium: 4.6 mmol/L (ref 3.5–5.1)
Sodium: 139 mmol/L (ref 135–145)

## 2024-07-30 LAB — FERRITIN: Ferritin: 17 ng/mL (ref 11–307)

## 2024-07-30 MED ORDER — NALTREXONE HCL 50 MG PO TABS
50.0000 mg | ORAL_TABLET | Freq: Every day | ORAL | Status: DC
  Administered 2024-07-30 – 2024-08-02 (×4): 50 mg via ORAL
  Filled 2024-07-30 (×4): qty 1

## 2024-07-30 MED ORDER — IRON SUCROSE 300 MG IVPB - SIMPLE MED
300.0000 mg | Freq: Once | Status: AC
  Administered 2024-07-30: 300 mg via INTRAVENOUS
  Filled 2024-07-30: qty 300

## 2024-07-30 MED ORDER — FERROUS SULFATE 325 (65 FE) MG PO TABS
325.0000 mg | ORAL_TABLET | Freq: Every day | ORAL | Status: DC
  Administered 2024-07-30 – 2024-08-02 (×4): 325 mg via ORAL
  Filled 2024-07-30 (×5): qty 1

## 2024-07-30 NOTE — Progress Notes (Signed)
 Progress Note   Patient: Kathryn [name] FMW:[id number] DOB: [birth year] DOA: 07/27/2024     3 DOS: the patient was seen and examined on 07/30/2024   Brief hospital course: [name] is a 68 year old female with history of fatty liver, liver cirrhosis, paroxysmal atrial fibrillation on Eliquis , obesity, hypothyroid, hyperlipidemia, who presents emergency department for chief concerns of shortness of breath and coughing up blood.  Vitals in the ED showed t of 98.4, rr 16, hr of 66, blood pressure 181/99, SpO2 100% on room air.  Serum sodium is 139, potassium 4.2, chloride 103, bicarb 25, BUN of 18, serum creatinine 1.09, eGFR 56, nonfasting glucose 109, WBC 3.3, hemoglobin 8.4, platelets of 112.  BNP was elevated 435.4.  HS troponin was 10 and on repeat is 9.  ED treatment: Fentanyl  25 mcg IV one-time dose, Protonix  40 mg IV one-time dose, thiamine  100 mg IV one-time dose.  Assessment and Plan: Hematemesis BRBPR Patient has had no further bleeding    Latest Ref Rng & Units 07/30/2024    8:38 AM 07/30/2024    3:33 AM 07/29/2024    9:44 PM  CBC  WBC 4.0 - 10.5 K/uL 3.1  2.7    Hemoglobin 12.0 - 15.0 g/dL 8.0  6.8  7.8   Hematocrit 36.0 - 46.0 % 27.0  22.5  26.2   Platelets 150 - 400 K/uL 120  101     Hgb stable on repeat CBC Had EGD and colonoscopy felt hematemesis due to portal gastropathy  Hemorrhoids noted on colonoscopy  Plan discharge this p.m. however patient developed reaction to IV iron .  Can discharge in the a.m. Continue ppi daily  Reg diet  Start p.o. iron  in the morning Discussed alcohol cessation. High-fiber diet   History of alcoholic cirrhosis complicated by esophageal varices Patient has had banding of esophageal varices in the past. Management of hematemesis per above Appears compensated   Hypertension On nadolol  80 mg daily at home for esophageal varices. Continue this medication     07/30/2024    8:05 PM 07/30/2024    2:45 PM 07/30/2024    1:00 PM  Vitals  with BMI  Systolic 157 178 843  Diastolic 80 92 101  Pulse 52 56 61    Hyperlipidemia Patient states she stopped taking atorvastatin  due to it upsetting her stomach   Hypothyroidism Home levothyroxine  112 mcg daily   Alcohol use disorder  Severe alcohol d/o.  Naltrexone  has been tried in the past.  Patient states that she has stopped this medication in the past when she becomes stressed. Resume this medication this hospitalization  Pafib  NSR. On eliquis .  Held in the setting of GI bleed. - Can likely be resumed on discharge       Subjective: No further bleeding with bowel movements.  Physical Exam: Vitals:   07/30/24 1200 07/30/24 1300 07/30/24 1445 07/30/24 2005  BP: (!) 160/89 (!) 156/101 (!) 178/92 (!) 157/80  Pulse: (!) 55 61 (!) 56 (!) 52  Resp:    18  Temp:  97.8 F (36.6 C) 98 F (36.7 C) 97.6 F (36.4 C)  TempSrc:  Oral Oral Oral  SpO2: 97% 100% 96% 96%  Weight:      Height:        Physical Exam  Constitutional: In no distress.  Cardiovascular: Normal rate, regular rhythm. No lower extremity edema  Pulmonary: Non labored breathing on room air, no wheezing or rales.   Abdominal: Soft. Non distended and non tender  Musculoskeletal: Normal range of motion.     Neurological: Alert and oriented to person, place, and time. Non focal  Skin: Skin is warm and dry.     Data Reviewed:     Latest Ref Rng & Units 07/30/2024    8:38 AM 07/30/2024    3:33 AM 07/29/2024    9:44 PM  CBC  WBC 4.0 - 10.5 K/uL 3.1  2.7    Hemoglobin 12.0 - 15.0 g/dL 8.0  6.8  7.8   Hematocrit 36.0 - 46.0 % 27.0  22.5  26.2   Platelets 150 - 400 K/uL 120  101     Hgb 8.4>>> 7 Plts 112>> 88    Latest Ref Rng & Units 07/30/2024    8:38 AM 07/28/2024    5:01 AM 07/27/2024    7:32 AM  BMP  Glucose 70 - 99 mg/dL 873  94  890   BUN 8 - 23 mg/dL 12  13  18    Creatinine 0.44 - 1.00 mg/dL 8.92  9.12  8.90   Sodium 135 - 145 mmol/L 139  139  139   Potassium 3.5 - 5.1 mmol/L 4.6  3.9  4.2    Chloride 98 - 111 mmol/L 106  104  103   CO2 22 - 32 mmol/L 25  25  25    Calcium  8.9 - 10.3 mg/dL 8.3  8.0  8.4      Family Communication: None at bedside. Patient verbalized understanding of the plan and had no questions.   Disposition: Status is: Inpatient Remains inpatient appropriate because: The management of GI bleed reaction to iron  infusion  Planned Discharge Destination: Home    Time spent: 35 minutes  Author: Alban Pepper, MD 07/30/2024 8:23 PM  For on call review [email or web address].

## 2024-07-30 NOTE — Plan of Care (Signed)

## 2024-07-30 NOTE — Plan of Care (Signed)
   Problem: Clinical Measurements: Goal: Will remain free from infection Outcome: Progressing Goal: Diagnostic test results will improve Outcome: Progressing Goal: Respiratory complications will improve Outcome: Progressing Goal: Cardiovascular complication will be avoided Outcome: Progressing   Problem: Activity: Goal: Risk for activity intolerance will decrease Outcome: Progressing

## 2024-07-31 ENCOUNTER — Encounter: Payer: Self-pay | Admitting: Gastroenterology

## 2024-07-31 DIAGNOSIS — R042 Hemoptysis: Secondary | ICD-10-CM | POA: Diagnosis not present

## 2024-07-31 LAB — BASIC METABOLIC PANEL WITH GFR
Anion gap: 7 (ref 5–15)
BUN: 12 mg/dL (ref 8–23)
CO2: 23 mmol/L (ref 22–32)
Calcium: 7.5 mg/dL — ABNORMAL LOW (ref 8.9–10.3)
Chloride: 98 mmol/L (ref 98–111)
Creatinine, Ser: 0.98 mg/dL (ref 0.44–1.00)
GFR, Estimated: >60 mL/min (ref >60)
Glucose, Bld: 108 mg/dL — ABNORMAL HIGH (ref 70–99)
Potassium: 3.9 mmol/L (ref 3.5–5.1)
Sodium: 130 mmol/L — ABNORMAL LOW (ref 135–145)

## 2024-07-31 LAB — OSMOLALITY: Osmolality: 284 mosm/kg (ref 275–295)

## 2024-07-31 LAB — CBC
HCT: 23.3 % — ABNORMAL LOW (ref 36.0–46.0)
Hemoglobin: 7 g/dL — ABNORMAL LOW (ref 12.0–15.0)
MCH: 24.1 pg — ABNORMAL LOW (ref 26.0–34.0)
MCHC: 30 g/dL (ref 30.0–36.0)
MCV: 80.3 fL (ref 80.0–100.0)
Platelets: 103 K/uL — ABNORMAL LOW (ref 150–400)
RBC: 2.9 MIL/uL — ABNORMAL LOW (ref 3.87–5.11)
RDW: 29 % — ABNORMAL HIGH (ref 11.5–15.5)
WBC: 3.1 K/uL — ABNORMAL LOW (ref 4.0–10.5)
nRBC: 1.6 % — ABNORMAL HIGH (ref 0.0–0.2)

## 2024-07-31 LAB — SODIUM: Sodium: 132 mmol/L — ABNORMAL LOW (ref 135–145)

## 2024-07-31 LAB — HEMOGLOBIN AND HEMATOCRIT, BLOOD
HCT: 24.4 % — ABNORMAL LOW (ref 36.0–46.0)
Hemoglobin: 7.2 g/dL — ABNORMAL LOW (ref 12.0–15.0)

## 2024-07-31 NOTE — Evaluation (Signed)
 Physical Therapy Evaluation Patient Details Name: Kathryn [name] MRN: [id number] DOB: 1956/11/17 Today's Date: 07/31/2024  History of Present Illness  Pt is a 68 y.o. female presenting to hospital 07/27/24 with c/o SOB and 1 episode of hemoptysis.  Pt admitted with cough with hemoptysis, htn, HLD, BRBPR.  PMH includes paroxysmal a-fib on Eliquis , alcohol use disorder, cirrhosis, CHF, portal vein thrombosis, NSTEMI.  Clinical Impression  Prior to recent medical concerns, pt reports being independent with ambulation; lives alone in 1st floor apt (no steps to enter).  No c/o pain during session.  Currently pt is modified independent with bed mobility; SBA with transfers; CGA with ambulating in room without AD use (pt appearing unsteady and more cautious); and SBA ambulating in hallway with RW use.  Improved gait pattern and balance noted with use of RW (during ambulation); pt also reporting feeling better walking with RW (compared to no AD use).  Generalized weakness noted.  Pt would currently benefit from skilled PT to address noted impairments and functional limitations (see below for any additional details).  Upon hospital discharge, pt would benefit from ongoing therapy.     If plan is discharge home, recommend the following: A little help with walking and/or transfers;A little help with bathing/dressing/bathroom;Assistance with cooking/housework;Assist for transportation;Help with stairs or ramp for entrance   Can travel by private vehicle    Yes    Equipment Recommendations Rolling walker (2 wheels) (pt reports her brother has her RW and can drop it off at her home)  Recommendations for Other Services       Functional Status Assessment Patient has had a recent decline in their functional status and demonstrates the ability to make significant improvements in function in a reasonable and predictable amount of time.     Precautions / Restrictions Precautions Precautions: Fall Recall of  Precautions/Restrictions: Intact Restrictions Weight Bearing Restrictions Per Provider Order: No      Mobility  Bed Mobility Overal bed mobility: Modified Independent             General bed mobility comments: Semi-supine to/from sitting with HOB elevated    Transfers Overall transfer level: Needs assistance Equipment used: None Transfers: Sit to/from Stand Sit to Stand: Supervision           General transfer comment: mild increased effort to stand x2 trials from bed    Ambulation/Gait Ambulation/Gait assistance: Contact guard assist, Supervision Gait Distance (Feet):  (40 feet no AD use; 140 feet with RW use) Assistive device: None, Rolling walker (2 wheels)   Gait velocity: decreased     General Gait Details: pt appearing mildly unsteady and more cautious ambulating in room without AD use (CGA for safety); improved balance and step through gait pattern noted with RW use  Stairs            Wheelchair Mobility     Tilt Bed    Modified Rankin (Stroke Patients Only)       Balance Overall balance assessment: Needs assistance Sitting-balance support: No upper extremity supported, Feet supported Sitting balance-Leahy Scale: Good Sitting balance - Comments: steady reaching outside BOS   Standing balance support: Bilateral upper extremity supported, During functional activity, Reliant on assistive device for balance Standing balance-Leahy Scale: Fair Standing balance comment: steady ambulating with RW use (pt appearing unsteady walking without UE support; CGA for safety)  Pertinent Vitals/Pain Pain Assessment Pain Assessment: No/denies pain HR 57 bpm at rest and increased up to 67 bpm with activity; SpO2 sats 97% or greater on room air during session.    Home Living Family/patient expects to be discharged to:: Private residence Living Arrangements: Alone Available Help at Discharge: Family;Available  PRN/intermittently Type of Home: Apartment (1st floor) Home Access: Level entry       Home Layout: One level Home Equipment: Grab bars - tub/shower;Rolling Walker (2 wheels);Rollator (4 wheels)      Prior Function Prior Level of Function : Independent/Modified Independent             Mobility Comments: No AD use at baseline; pt reports 1 fall in Jan/February d/t passing out. ADLs Comments: Independent     Extremity/Trunk Assessment   Upper Extremity Assessment Upper Extremity Assessment: Overall WFL for tasks assessed    Lower Extremity Assessment Lower Extremity Assessment: Generalized weakness    Cervical / Trunk Assessment Cervical / Trunk Assessment: Normal  Communication   Communication Communication: No apparent difficulties    Cognition Arousal: Alert Behavior During Therapy: WFL for tasks assessed/performed   PT - Cognitive impairments: No apparent impairments                         Following commands: Intact       Cueing Cueing Techniques: Verbal cues     General Comments  Pt agreeable to PT session.    Exercises     Assessment/Plan    PT Assessment Patient needs continued PT services  PT Problem List Decreased strength;Decreased activity tolerance;Decreased balance;Decreased mobility;Decreased knowledge of use of DME;Decreased knowledge of precautions       PT Treatment Interventions DME instruction;Gait training;Functional mobility training;Therapeutic activities;Therapeutic exercise;Balance training;Patient/family education    PT Goals (Current goals can be found in the Care Plan section)  Acute Rehab PT Goals Patient Stated Goal: to improve strength and balance PT Goal Formulation: With patient Time For Goal Achievement: 08/14/24 Potential to Achieve Goals: Good    Frequency Min 2X/week     Co-evaluation               AM-PAC PT 6 Clicks Mobility  Outcome Measure Help needed turning from your back to your  side while in a flat bed without using bedrails?: None Help needed moving from lying on your back to sitting on the side of a flat bed without using bedrails?: None Help needed moving to and from a bed to a chair (including a wheelchair)?: A Little Help needed standing up from a chair using your arms (e.g., wheelchair or bedside chair)?: A Little Help needed to walk in hospital room?: A Little Help needed climbing 3-5 steps with a railing? : A Little 6 Click Score: 20    End of Session Equipment Utilized During Treatment: Gait belt Activity Tolerance: Patient tolerated treatment well Patient left: in bed;with call bell/phone within reach;Other (comment) (pt declined bed alarm--nurse notified) Nurse Communication: Mobility status;Precautions PT Visit Diagnosis: Unsteadiness on feet (R26.81);Other abnormalities of gait and mobility (R26.89);Muscle weakness (generalized) (M62.81);History of falling (Z91.81)    Time: 8641-8584 PT Time Calculation (min) (ACUTE ONLY): 17 min   Charges:   PT Evaluation $PT Eval Low Complexity: 1 Low PT Treatments $Gait Training: 8-22 mins PT General Charges $$ ACUTE PT VISIT: 1 Visit        Damien Caulk, PT 07/31/24, 3:10 PM

## 2024-07-31 NOTE — Progress Notes (Addendum)
 Progress Note   Patient: Kathryn [name] FMW:[id number] DOB: 10/13/1956 DOA: 07/27/2024     4 DOS: the patient was seen and examined on 07/31/2024   Brief hospital course: HPI on admission 07/27/24: [name] is a 68 year old female with history of fatty liver, liver cirrhosis, paroxysmal atrial fibrillation on Eliquis , obesity, hypothyroid, hyperlipidemia, who presents emergency department for chief concerns of shortness of breath and coughing up blood.   Vitals in the ED showed t of 98.4, rr 16, hr of 66, blood pressure 181/99, SpO2 100% on room air.   Serum sodium is 139, potassium 4.2, chloride 103, bicarb 25, BUN of 18, serum creatinine 1.09, eGFR 56, nonfasting glucose 109, WBC 3.3, hemoglobin 8.4, platelets of 112.   BNP was elevated 435.4.  HS troponin was 10 and on repeat is 9.   ED treatment: Fentanyl  25 mcg IV one-time dose, Protonix  40 mg IV one-time dose, thiamine  100 mg IV one-time dose.    Pt was admitted for further evaluation into the cause of hemoptysis.  Further hospital course and management as outlined in detail below.    Assessment and Plan:  Hematemesis - resolved BRBPR - resolved Iron  deficiency anemia Acute on chronic anemia Patient has had no further bleeding Had EGD and colonoscopy felt hematemesis due to portal gastropathy  Hemorrhoids noted on colonoscopy Pt given IV iron  infusion on 9/7 Pt has not required blood transfusion, but Hbg is at the threshold to warrant transfusion, but not actively bleeding   Discharge was planned 9/7, however patient developed reaction to IV iron . Hgb this AM trended from 8.0 >> 7.0 again.  --Repeat Hbg level this afternoon --Transfuse pRBC's for Hbg < 7 --Continue PPI --Regular diet resumed --Started p.o. iron  supplement --Discussed alcohol cessation   Hyponatremia - Na down from 139 to 130 this AM Pt does not seem hypervolemic despite cirrhosis history. 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 Management of hematemesis per above Appears compensated  Not on diuretics   Hypertension --On nadolol  80 mg daily at home for esophageal varices - continue   Hyperlipidemia --Patient reported stopped atorvastatin  due to it upsetting her stomach    Hypothyroidism --Home levothyroxine  112 mcg daily    Alcohol use disorder  Severe alcohol d/o.  Naltrexone  has been tried in the past.  Patient states that she has stopped this medication in the past when she becomes stressed. --Naltrexone  has been resumed this admission   Paroxysmal A-fib NSR.  --Holding Eliquis  in setting of GI bleed and worsened anemia --Expect Eliquis  can be resumed on d/c if Hbg improving        Subjective: Pt awake resting in bed this AM.  She denies acute complaints.  States has not been up to try ambulating yet since admission.  Hopes to go home soon.   Physical Exam: Vitals:   07/31/24 0416 07/31/24 0806 07/31/24 1119 07/31/24 1524  BP: (!) 142/81 138/85 (!) 175/77 (!) 156/69  Pulse: (!) 54 (!) 55 (!) 55 (!) 56  Resp: 18 18 19 17   Temp: 97.8 F (36.6 C) 98 F (36.7 C) 97.7 F (36.5 C) 98.8 F (37.1 C)  TempSrc:   Oral   SpO2: 98% 97% 100% 99%  Weight:      Height:       General exam: awake,  alert, no acute distress HEENT: moist mucus membranes, hearing grossly normal  Respiratory system: CTAB, no wheezes, rales or rhonchi, normal respiratory effort. Cardiovascular system: normal S1/S2, RRR, no pedal edema.   Gastrointestinal system: soft, NT, ND Central nervous system: A&O x 3. no gross focal neurologic deficits, normal speech Skin: dry, intact, normal temperature Psychiatry: normal mood, congruent affect, judgement and insight appear normal   Data Reviewed:  Notable labs -- Na 139 >> 130 >> 132 Ca  7.5 Hbg 8.0 >> 7.0 >> 7.2 (this afternoon repeat)  Family Communication: None present on rounds. Will attempt to call as time allows.  Disposition: Status is: Inpatient Remains inpatient appropriate because: closely monitoring anemia that may need transfusion, new hyponatremia under evaluation.   Planned Discharge Destination: Home and Home with Home Health    Time spent: 45 minutes  Author: Burnard DELENA Cunning, DO 07/31/2024 3:41 PM  For on call review [email or web address].

## 2024-07-31 NOTE — Care Management Important Message (Signed)
 Important Message  Patient Details  Name: Kathryn [name] MRN: [id number] Date of Birth: 1956/01/31   Important Message Given:  Yes - Medicare IM     [name] 07/31/2024, 3:37 PM

## 2024-08-01 DIAGNOSIS — R042 Hemoptysis: Secondary | ICD-10-CM | POA: Diagnosis not present

## 2024-08-01 LAB — CBC
HCT: 22.1 % — ABNORMAL LOW (ref 36.0–46.0)
Hemoglobin: 6.6 g/dL — ABNORMAL LOW (ref 12.0–15.0)
MCH: 24.1 pg — ABNORMAL LOW (ref 26.0–34.0)
MCHC: 29.9 g/dL — ABNORMAL LOW (ref 30.0–36.0)
MCV: 80.7 fL (ref 80.0–100.0)
Platelets: 117 K/uL — ABNORMAL LOW (ref 150–400)
RBC: 2.74 MIL/uL — ABNORMAL LOW (ref 3.87–5.11)
RDW: 29.3 % — ABNORMAL HIGH (ref 11.5–15.5)
WBC: 4.1 K/uL (ref 4.0–10.5)
nRBC: 3.7 % — ABNORMAL HIGH (ref 0.0–0.2)

## 2024-08-01 LAB — BASIC METABOLIC PANEL WITH GFR
Anion gap: 8 (ref 5–15)
BUN: 10 mg/dL (ref 8–23)
CO2: 24 mmol/L (ref 22–32)
Calcium: 8.1 mg/dL — ABNORMAL LOW (ref 8.9–10.3)
Chloride: 107 mmol/L (ref 98–111)
Creatinine, Ser: 1.31 mg/dL — ABNORMAL HIGH (ref 0.44–1.00)
GFR, Estimated: 45 mL/min — ABNORMAL LOW (ref >60)
Glucose, Bld: 99 mg/dL (ref 70–99)
Potassium: 4.4 mmol/L (ref 3.5–5.1)
Sodium: 138 mmol/L (ref 135–145)

## 2024-08-01 LAB — HEMOGLOBIN AND HEMATOCRIT, BLOOD
HCT: 29.5 % — ABNORMAL LOW (ref 36.0–46.0)
Hemoglobin: 8.7 g/dL — ABNORMAL LOW (ref 12.0–15.0)

## 2024-08-01 LAB — PREPARE RBC (CROSSMATCH)

## 2024-08-01 MED ORDER — SODIUM CHLORIDE 0.9% IV SOLUTION: Freq: Once | INTRAVENOUS | Status: AC

## 2024-08-01 NOTE — Progress Notes (Signed)
 Progress Note   Patient: JUAN OLTHOFF FMW:[id number] DOB: 1956-09-12 DOA: 07/27/2024     5 DOS: the patient was seen and examined on 08/01/2024   Brief hospital course: HPI on admission 07/27/24: Ms. Nida Manfredi is a 68 year old female with history of fatty liver, liver cirrhosis, paroxysmal atrial fibrillation on Eliquis , obesity, hypothyroid, hyperlipidemia, who presents emergency department for chief concerns of shortness of breath and coughing up blood.   Vitals in the ED showed t of 98.4, rr 16, hr of 66, blood pressure 181/99, SpO2 100% on room air.   Serum sodium is 139, potassium 4.2, chloride 103, bicarb 25, BUN of 18, serum creatinine 1.09, eGFR 56, nonfasting glucose 109, WBC 3.3, hemoglobin 8.4, platelets of 112.   BNP was elevated 435.4.  HS troponin was 10 and on repeat is 9.   ED treatment: Fentanyl  25 mcg IV one-time dose, Protonix  40 mg IV one-time dose, thiamine  100 mg IV one-time dose.    Pt was admitted for further evaluation into the cause of hemoptysis vs hematemesis.  Further hospital course and management as outlined in detail below.    Assessment and Plan:  Hematemesis - resolved BRBPR/hematochezia - resolved Iron  deficiency anemia Acute on chronic anemia Patient has had no further bleeding Had EGD and colonoscopy felt hematemesis due to portal gastropathy  Hemorrhoids noted on colonoscopy Pt given IV iron  infusion on 9/7 Discharge was planned 9/7, however patient developed reaction to IV iron . Subsequently, Hgb trended down 8.0 >> 7.0 >> 7.2 >> 6.6 this AM --Transfuse 1 unit pRBC's today.  Pt consented. --Repeat Hbg post-transfusion and CBC in AM --Transfuse pRBC's for Hbg < 7 --Continue PPI --Regular diet resumed --Started p.o. iron  supplement --Discussed alcohol cessation   Hyponatremia - Resolved.  Transient Na drop to 130 on 9/8 resolved without intervention.  Pt did not seem hypervolemic despite cirrhosis history. 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 Management of hematemesis per above Appears compensated  Not on diuretics   Hypertension --On nadolol  80 mg daily at home for esophageal varices - continue   Hyperlipidemia --Patient reported stopped atorvastatin  due to it upsetting her stomach    Hypothyroidism --Home levothyroxine  112 mcg daily    Alcohol use disorder  Severe alcohol d/o.  Naltrexone  has been tried in the past.  Patient states that she has stopped this medication in the past when she becomes stressed. --Naltrexone  has been resumed this admission   Paroxysmal A-fib NSR.  --Holding Eliquis  in setting of GI bleed and worsened anemia --To be determined to resume vs hold Eliquis  on d/c.  Would consider hold until repeat CBC at close follow up since needing transfusion, and thrombocytopenic        Subjective: Pt seated edge of bed when seen this AM.  She reports fatigue but otherwise feels okay.  She denies any signs of further bleeding.   No other acute complaints.  She is agreeable to blood transfusion.   Physical Exam: Vitals:   08/01/24 1149 08/01/24 1348 08/01/24 1405 08/01/24 1631  BP: (!) 152/68 127/73 (!) 148/71 (!) 159/82  Pulse: (!) 58 63 (!) 57 (!) 56  Resp:  17 17 16   Temp: 98.2 F (36.8 C) 98 F (36.7 C) 98.4 F (36.9 C) 98.4 F (36.9 C)  TempSrc:  Oral Oral Oral  SpO2: 98% 100% 99% 100%  Weight:      Height:  General exam: awake, alert, no acute distress HEENT: moist mucus membranes, hearing grossly normal  Respiratory system: CTAB, no wheezes, rales or rhonchi, normal respiratory effort. Cardiovascular system: normal S1/S2, RRR, no pedal edema.   Gastrointestinal system: soft, NT, ND Central nervous system: A&O x 3. no gross focal neurologic deficits, normal speech Skin: dry, intact, normal temperature Psychiatry: normal mood, congruent affect, judgement  and insight appear normal   Data Reviewed:  Notable labs -- Na normalized to 138 Cr 0.98 >> 1.31 Ca 8.1  Hbg 8.0 >> 7.0 >> 7.2 >> 6.6 this AM (RBC's transfused)  Platelets 103 >> 117 improved WBC normalized to 4.1, leukopenia resolved.   Family Communication: None present on rounds. Will attempt to call as time allows.  Disposition: Status is: Inpatient Remains inpatient appropriate because: closely monitoring anemia that may need transfusion, new hyponatremia under evaluation.   Planned Discharge Destination: Home and Home with Home Health    Time spent: 42 minutes  Author: Burnard DELENA Cunning, DO 08/01/2024 4:45 PM  For on call review [email or web address].

## 2024-08-01 NOTE — Plan of Care (Signed)
  Problem: Education: Goal: Knowledge of General Education information will improve Description: Including pain rating scale, medication(s)/side effects and non-pharmacologic comfort measures Outcome: Progressing   Problem: Clinical Measurements: Goal: Ability to maintain clinical measurements within normal limits will improve Outcome: Progressing Goal: Will remain free from infection Outcome: Progressing Goal: Diagnostic test results will improve Outcome: Progressing Goal: Respiratory complications will improve Outcome: Progressing Goal: Cardiovascular complication will be avoided Outcome: Progressing   Problem: Activity: Goal: Risk for activity intolerance will decrease Outcome: Progressing   Problem: Nutrition: Goal: Adequate nutrition will be maintained Outcome: Progressing   Problem: Coping: Goal: Level of anxiety will decrease Outcome: Progressing   Problem: Pain Managment: Goal: General experience of comfort will improve and/or be controlled Outcome: Progressing   Problem: Safety: Goal: Ability to remain free from injury will improve Outcome: Progressing   Problem: Skin Integrity: Goal: Risk for impaired skin integrity will decrease Outcome: Progressing

## 2024-08-01 NOTE — Plan of Care (Signed)

## 2024-08-01 NOTE — TOC Initial Note (Signed)
 Transition of Care [facility]) - Initial/Assessment Note    Patient Details  Name: Kathryn [name] MRN: [id number] Date of Birth: 04-06-1956  [facility] CM/SW Contact:    Alfonso Rummer, LCSW Phone Number: 08/01/2024, 1:02 PM  Clinical Narrative:   KEN DELENA Rummer met with Kathryn [name] in room 254. Pt reports no barriers affording medications. Pt uses Psychologist, forensic. No history of active home health. LCSW A  Darl Kuss advised pt of recommendation for home health pt however Kathryn [name] adamantly refused home health and prefers to return home once she is medically ready. Pt lives alone, reports grandson is helpful. Pt reports her home is safe and does not have any safety concerns.                       Patient Goals and CMS Choice            Expected Discharge Plan and Services      Home                                         Prior Living Arrangements/Services                       Activities of Daily Living   ADL Screening (condition at time of admission) Independently performs ADLs?: Yes (appropriate for developmental age) Is the patient deaf or have difficulty hearing?: No Does the patient have difficulty seeing, even when wearing glasses/contacts?: No Does the patient have difficulty concentrating, remembering, or making decisions?: No  Permission Sought/Granted                  Emotional Assessment              Admission diagnosis:  Cough with hemoptysis [R04.2] Gastrointestinal hemorrhage, unspecified gastrointestinal hemorrhage type [K92.2] Anemia, unspecified type [D64.9] Patient Active Problem List   Diagnosis Date Noted   Cough with hemoptysis 07/27/2024   Hyperlipemia 07/27/2024   AKI (acute kidney injury) (HCC) 02/22/2024   Alcohol use 02/22/2024   Demand ischemia (HCC) 02/21/2024   Atrial fibrillation with rapid ventricular response (HCC) 02/18/2024   NSTEMI (non-ST elevated myocardial infarction) (HCC) 02/18/2024    Alcoholic cirrhosis of liver (HCC)    Nausea vomiting and diarrhea 07/28/2023   Nausea and vomiting 07/28/2023   Colitis 07/27/2023   High anion gap metabolic acidosis 07/27/2023   Alcohol use disorder 12/02/2022   Sarcoid 12/02/2022   Hypokalemia 01/05/2022   Hypomagnesemia 01/05/2022   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   Physical deconditioning 01/04/2022   Cholelithiasis without cholecystitis 01/03/2022   Thrombocytopenia (HCC) 01/03/2022   Aortic atherosclerosis (HCC)    Acute pancreatitis 01/02/2022   Symptomatic anemia 06/24/2021   Hypertensive urgency 06/24/2021   Asthma 06/24/2021   CKD (chronic kidney disease), stage IIIa 06/24/2021   Chronic CHF (HCC) 06/24/2021   Pancytopenia (HCC) 06/24/2021   Community acquired pneumonia 11/20/2019   Pneumonia due to COVID-19 virus 11/20/2019   Anemia 06/12/2016   Hypoglycemia 02/11/2016   Hematemesis 04/04/2015   Cirrhosis (HCC) 04/04/2015   Esophageal varices (HCC) 04/04/2015   Hypothyroidism 04/04/2015   Hypertension 04/04/2015   Chest pain 04/04/2015   PCP:  Odell Tor Edra CINDERELLA, MD Pharmacy:   [facility] [address]), [place] - [address] [place]AC[place]) [place] [address] Phone: [phone]  Fax: [phone]     Social Drivers of Health (SDOH) Social History: SDOH Screenings   Food Insecurity: No Food Insecurity (07/28/2024)  Housing: Low Risk  (07/28/2024)  Transportation Needs: Unmet Transportation Needs (07/28/2024)  Utilities: Not At Risk (07/28/2024)  Depression (PHQ2-9): Low Risk  (12/08/2023)  Social Connections: Socially Isolated (07/28/2024)  Tobacco Use: Low Risk  (07/27/2024)   SDOH Interventions:     Readmission Risk Interventions    08/01/2024   12:57 PM 12/03/2022   12:32 PM  Readmission Risk Prevention Plan  Transportation Screening Complete Complete  PCP or Specialist Appt within 3-5 Days Complete Complete  HRI or Home Care Consult Patient refused    Social Work Consult for Recovery Care Planning/Counseling Complete Complete  Palliative Care Screening Not Applicable Not Applicable  Medication Review Oceanographer) Complete Complete

## 2024-08-02 DIAGNOSIS — R042 Hemoptysis: Secondary | ICD-10-CM | POA: Diagnosis not present

## 2024-08-02 LAB — CBC
HCT: 26.3 % — ABNORMAL LOW (ref 36.0–46.0)
Hemoglobin: 8.1 g/dL — ABNORMAL LOW (ref 12.0–15.0)
MCH: 24.9 pg — ABNORMAL LOW (ref 26.0–34.0)
MCHC: 30.8 g/dL (ref 30.0–36.0)
MCV: 80.9 fL (ref 80.0–100.0)
Platelets: 130 K/uL — ABNORMAL LOW (ref 150–400)
RBC: 3.25 MIL/uL — ABNORMAL LOW (ref 3.87–5.11)
RDW: 27.8 % — ABNORMAL HIGH (ref 11.5–15.5)
WBC: 4.4 K/uL (ref 4.0–10.5)
nRBC: 5.5 % — ABNORMAL HIGH (ref 0.0–0.2)

## 2024-08-02 LAB — BPAM RBC
Blood Product Expiration Date: 202510092359
ISSUE DATE / TIME: 202509091338
Unit Type and Rh: 6200

## 2024-08-02 LAB — TYPE AND SCREEN
ABO/RH(D): A POS
Antibody Screen: NEGATIVE
Unit division: 0

## 2024-08-02 MED ORDER — FOLIC ACID 1 MG PO TABS: 1.0000 mg | ORAL_TABLET | Freq: Every day | ORAL | 2 refills | Status: DC

## 2024-08-02 MED ORDER — FERROUS SULFATE 325 (65 FE) MG PO TABS: 325.0000 mg | ORAL_TABLET | Freq: Every day | ORAL | 3 refills | Status: DC

## 2024-08-02 MED ORDER — SENNOSIDES-DOCUSATE SODIUM 8.6-50 MG PO TABS: 1.0000 | ORAL_TABLET | Freq: Every evening | ORAL | 0 refills | Status: DC | PRN

## 2024-08-02 MED ORDER — PANTOPRAZOLE SODIUM 40 MG PO TBEC: 40.0000 mg | DELAYED_RELEASE_TABLET | Freq: Every day | ORAL | 2 refills | Status: DC

## 2024-08-02 NOTE — Discharge Summary (Signed)
 Physician Discharge Summary   Patient: Kathryn [name] MRN: [id number] DOB: [birth year]  Admit date:     07/27/2024  Discharge date: 08/02/24  Discharge Physician: Drue ONEIDA Potter   PCP: Odell Tor Edra CINDERELLA, MD   Recommendations at discharge:  Follow-up with PCP Discharge Diagnoses:  Hematemesis - resolved BRBPR/hematochezia - resolved Iron  deficiency anemia Acute on chronic anemia Hyponatremia - Resolved.   History of alcoholic cirrhosis complicated by esophageal varices Hypertension Hyperlipidemia Hypothyroidism Alcohol use disorder  Paroxysmal A-fib  Hospital Course: HPI on admission 07/27/24: [name] is a 68 year old female with history of fatty liver, liver cirrhosis, paroxysmal atrial fibrillation on Eliquis , obesity, hypothyroid, hyperlipidemia, who presents emergency department for chief concerns of shortness of breath and coughing up blood.   Vitals in the ED showed t of 98.4, rr 16, hr of 66, blood pressure 181/99, SpO2 100% on room air.   Serum sodium is 139, potassium 4.2, chloride 103, bicarb 25, BUN of 18, serum creatinine 1.09, eGFR 56, nonfasting glucose 109, WBC 3.3, hemoglobin 8.4, platelets of 112.   BNP was elevated 435.4.  HS troponin was 10 and on repeat is 9.   ED treatment: Fentanyl  25 mcg IV one-time dose, Protonix  40 mg IV one-time dose, thiamine  100 mg IV one-time dose. Pt was admitted for further evaluation into the cause of hemoptysis vs hematemesis.   Further hospital course and management as outlined in detail below.  Assessment and Plan:   Hematemesis - resolved BRBPR/hematochezia - resolved Iron  deficiency anemia Acute on chronic anemia Patient has had no further bleeding Had EGD and colonoscopy felt hematemesis due to portal gastropathy  Hemorrhoids noted on colonoscopy Pt given IV iron  infusion on 9/7 Discharge was planned 9/7, however patient developed reaction to IV iron . -- Stop was post 1 unit of blood  transfusion --Continue PPI --Regular diet resumed --Started p.o. iron  supplement --Discussed alcohol cessation     Hyponatremia - Resolved.     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 Management of hematemesis per above Appears compensated  Not on diuretics   Hypertension --On nadolol  80 mg daily at home for esophageal varices - continue   Hyperlipidemia --Patient reported stopped atorvastatin  due to it upsetting her stomach    Hypothyroidism --Home levothyroxine  112 mcg daily    Alcohol use disorder  Severe alcohol d/o.  Naltrexone  has been tried in the past.  Patient states that she has stopped this medication in the past when she becomes stressed. --Naltrexone  has been resumed this admission   Paroxysmal A-fib Eliquis  resumed after risks and benefits were discussed with patient as hemoglobin stable and no evidence of bleeding noted     Consultants: Gastroenterologist Procedures performed: As mentioned above Disposition: Home Diet recommendation:  Cardiac diet DISCHARGE MEDICATION: Allergies as of 08/02/2024       Reactions   Oxycodone -acetaminophen     Hydrocodone-acetaminophen  Other (See Comments)   Reaction: pt can't take anything with tylenol  due to her liver disease.    Ibuprofen Other (See Comments)   Esophageal varices   Naproxen Other (See Comments)   Esophageal varices   Propoxyphene Other (See Comments)   Reaction: pt isn't sure, but knows she can't take it.    Shellfish Allergy Hives   Sodium Ferric Gluconate [ferrous Gluconate]    IV IRON    Tamiflu  [oseltamivir Phosphate] Other (See Comments)   Esophageal varices        Medication List  TAKE these medications    albuterol  108 (90 Base) MCG/ACT inhaler Commonly known as: VENTOLIN  HFA Inhale 1-2 puffs into the lungs every 4 (four) hours as needed for wheezing or shortness of breath.   atorvastatin  10 MG  tablet Commonly known as: LIPITOR Take 1 tablet (10 mg total) by mouth daily.   Blood Glucose Monitor System w/Device Kit as directed as directed daily for 30 days   Eliquis  5 MG Tabs tablet Generic drug: apixaban  Take 1 tablet (5 mg total) by mouth 2 (two) times daily.   ferrous sulfate  325 (65 FE) MG tablet Take 1 tablet (325 mg total) by mouth daily with breakfast. Start taking on: August 03, 2024   folic acid  1 MG tablet Commonly known as: FOLVITE  Take 1 tablet (1 mg total) by mouth daily. Start taking on: August 03, 2024   levothyroxine  112 MCG tablet Commonly known as: SYNTHROID  1 tablet in the morning on an empty stomach Orally Once a day for 90 days   multivitamin tablet Take 1 tablet by mouth every other day.   nadolol  80 MG tablet Commonly known as: CORGARD  Take by mouth daily. For 90 days   pantoprazole  40 MG tablet Commonly known as: PROTONIX  Take 1 tablet (40 mg total) by mouth daily. Start taking on: August 03, 2024   senna-docusate 8.6-50 MG tablet Commonly known as: Senokot-S Take 1 tablet by mouth at bedtime as needed for mild constipation.   thiamine  100 MG tablet Commonly known as: VITAMIN B1 Take 1 tablet (100 mg total) by mouth daily.        Discharge Exam: Filed Weights   07/27/24 0715 08/01/24 0530 08/02/24 0500  Weight: 83 kg 83 kg 83.9 kg   General exam: awake, alert, no acute distress HEENT: moist mucus membranes, hearing grossly normal  Respiratory system: CTAB, no wheezes, rales or rhonchi, normal respiratory effort. Cardiovascular system: normal S1/S2, RRR, no pedal edema.   Gastrointestinal system: soft, NT, ND Central nervous system: A&O x 3. no gross focal neurologic deficits, normal speech Skin: dry, intact, normal temperature Psychiatry: normal mood, congruent affect, judgement and insight appear normal    Condition at discharge: good  The results of significant diagnostics from this hospitalization (including  imaging, microbiology, ancillary and laboratory) are listed below for reference.   Imaging Studies: CT Angio Chest/Abd/Pel for Dissection W and/or Wo Contrast Result Date: 07/27/2024 CLINICAL DATA:  Acute aortic syndrome (AAS) suspected. Shortness of breath, vomiting EXAM: CT ANGIOGRAPHY CHEST, ABDOMEN AND PELVIS TECHNIQUE: Non-contrast CT of the chest was initially obtained. Multidetector CT imaging through the chest, abdomen and pelvis was performed using the standard protocol during bolus administration of intravenous contrast. Multiplanar reconstructed images and MIPs were obtained and reviewed to evaluate the vascular anatomy. RADIATION DOSE REDUCTION: This exam was performed according to the departmental dose-optimization program which includes automated exposure control, adjustment of the mA and/or kV according to patient size and/or use of iterative reconstruction technique. CONTRAST:  OMNIPAQUE  IOHEXOL  350 MG/ML SOLN COMPARISON:  06/07/2024 FINDINGS: CTA CHEST FINDINGS Cardiovascular: Heart is normal size. Aorta is normal caliber. Scattered coronary artery and aortic atherosclerosis. No evidence of aortic dissection. No filling defects in the pulmonary arteries to suggest pulmonary emboli. Mediastinum/Nodes: No mediastinal, hilar, or axillary adenopathy. Trachea and esophagus are unremarkable. Thyroid unremarkable. Moderate-sized hiatal hernia is stable. Lungs/Pleura: Mild centrilobular emphysema. No confluent opacities or effusions. Musculoskeletal: Chest wall soft tissues are unremarkable. No acute bony abnormality. Review of the MIP images confirms the above findings.  CTA ABDOMEN AND PELVIS FINDINGS VASCULAR Aorta: Moderate aortic atherosclerosis. No aneurysm, dissection or significant stenosis. Celiac: Patent without evidence of aneurysm, dissection, vasculitis or significant stenosis. SMA: Patent without evidence of aneurysm, dissection, vasculitis or significant stenosis. Renals: Both renal  arteries are patent without evidence of aneurysm, dissection, vasculitis, fibromuscular dysplasia or significant stenosis. IMA: Patent without evidence of aneurysm, dissection, vasculitis or significant stenosis. Inflow: Patent without evidence of aneurysm, dissection, vasculitis or significant stenosis. Veins: No obvious venous abnormality within the limitations of this arterial phase study. Review of the MIP images confirms the above findings. NON-VASCULAR Hepatobiliary: Severe diffuse fatty infiltration of the liver, progressed since prior study. Layering gallstones within the gallbladder also stable. No biliary ductal dilatation. Pancreas: No focal abnormality or ductal dilatation. Spleen: No focal abnormality.  Normal size. Adrenals/Urinary Tract: Adrenal glands normal. Bilat renal cysts warrant no additional follow-up imaging. No hydronephrosis. Urinary bladder unremarkable. Stomach/Bowel: Areas of wall thickening within the colon, most notable in the right colon and transverse colon. This could reflect colitis or portal colopathy. No bowel obstruction or inflammatory process. Normal appendix. Lymphatic: No adenopathy Reproductive: Calcified and noncalcified uterine fibroids. No adnexal mass. Other: Small to mild free fluid in the abdomen and pelvis. Musculoskeletal: No acute bony abnormality. Stable hemangioma in the L5 vertebral body. Review of the MIP images confirms the above findings. IMPRESSION: No evidence of aortic aneurysm or dissection. No evidence of pulmonary embolus. Coronary artery disease, aortic atherosclerosis. Stable moderate-sized hiatal hernia. Severe diffuse fatty infiltration of the liver, worsening since prior study. Cholelithiasis. Mild free fluid in the abdomen and pelvis. Areas of wall thickening in the right colon and transverse colon. This could reflect colitis or portal colopathy. Stable circumferential wall thickening in the region of the rectum. This could reflect proctitis.  Recommend clinical correlation. Consider further evaluation with colonoscopy to exclude mass lesion. Electronically Signed   By: Franky Crease M.D.   On: 07/27/2024 12:17   DG Chest Portable 1 View Result Date: 07/27/2024 CLINICAL DATA:  Shortness of breath onset 4:30 a.m., coughed up at least 1 blood clot. Vomiting last night. EXAM: PORTABLE CHEST 1 VIEW COMPARISON:  AP Lat chest 06/07/2024 FINDINGS: The lungs are clear. The sulci are sharp. There is mild cardiomegaly. Loop recorder device left chest wall. The mediastinum is normally outlined apart from a small to moderate-sized hiatal hernia. There is calcification in the transverse aorta. No significant osseous findings. Overlying telemetry leads. IMPRESSION: 1. No evidence of acute chest disease. 2. Mild cardiomegaly. 3. Small to moderate-sized hiatal hernia. Electronically Signed   By: Francis Quam M.D.   On: 07/27/2024 07:39   CUP PACEART REMOTE DEVICE CHECK Result Date: 07/21/2024 ILR summary report received. Battery status OK. Normal device function. No new symptom, tachy, brady, or pause episodes. 267 new AF episodes, longest 6 hrs 40 min on 07/04/24 at 16:06, V rates > 100 bpm ~ 40% during AT/AF, Burden 12.4%, presenting EGM c/w  an ongoing atrial arrhythmia, Med list includes Eliquis ; routed to clinic for review. Monthly summary reports and ROV/PRN. MC, CVRS   Microbiology: Results for orders placed or performed during the hospital encounter of 07/27/24  Resp panel by RT-PCR (RSV, Flu A&B, Covid) Anterior Nasal Swab     Status: None   Collection Time: 07/27/24  7:00 PM   Specimen: Anterior Nasal Swab  Result Value Ref Range Status   SARS Coronavirus 2 by RT PCR NEGATIVE NEGATIVE Final    Comment: (NOTE) SARS-CoV-2 target nucleic acids are  NOT DETECTED.  The SARS-CoV-2 RNA is generally detectable in upper respiratory specimens during the acute phase of infection. The lowest concentration of SARS-CoV-2 viral copies this assay can detect  is 138 copies/mL. A negative result does not preclude SARS-Cov-2 infection and should not be used as the sole basis for treatment or other patient management decisions. A negative result may occur with  improper specimen collection/handling, submission of specimen other than nasopharyngeal swab, presence of viral mutation(s) within the areas targeted by this assay, and inadequate number of viral copies(<138 copies/mL). A negative result must be combined with clinical observations, patient history, and epidemiological information. The expected result is Negative.  Fact Sheet for Patients:  BloggerCourse.com  Fact Sheet for Healthcare Providers:  SeriousBroker.it  This test is no t yet approved or cleared by the [place]  FDA and  has been authorized for detection and/or diagnosis of SARS-CoV-2 by FDA under an Emergency Use Authorization (EUA). This EUA will remain  in effect (meaning this test can be used) for the duration of the COVID-19 declaration under Section 564(b)(1) of the Act, 21 U.S.C.section 360bbb-3(b)(1), unless the authorization is terminated  or revoked sooner.       Influenza A by PCR NEGATIVE NEGATIVE Final   Influenza B by PCR NEGATIVE NEGATIVE Final    Comment: (NOTE) The Xpert Xpress SARS-CoV-2/FLU/RSV plus assay is intended as an aid in the diagnosis of influenza from Nasopharyngeal swab specimens and should not be used as a sole basis for treatment. Nasal washings and aspirates are unacceptable for Xpert Xpress SARS-CoV-2/FLU/RSV testing.  Fact Sheet for Patients: BloggerCourse.com  Fact Sheet for Healthcare Providers: SeriousBroker.it  This test is not yet approved or cleared by the [place]  FDA and has been authorized for detection and/or diagnosis of SARS-CoV-2 by FDA under an Emergency Use Authorization (EUA). This EUA will remain in effect  (meaning this test can be used) for the duration of the COVID-19 declaration under Section 564(b)(1) of the Act, 21 U.S.C. section 360bbb-3(b)(1), unless the authorization is terminated or revoked.     Resp Syncytial Virus by PCR NEGATIVE NEGATIVE Final    Comment: (NOTE) Fact Sheet for Patients: BloggerCourse.com  Fact Sheet for Healthcare Providers: SeriousBroker.it  This test is not yet approved or cleared by the [place]  FDA and has been authorized for detection and/or diagnosis of SARS-CoV-2 by FDA under an Emergency Use Authorization (EUA). This EUA will remain in effect (meaning this test can be used) for the duration of the COVID-19 declaration under Section 564(b)(1) of the Act, 21 U.S.C. section 360bbb-3(b)(1), unless the authorization is terminated or revoked.  Performed at [facility], [address] [place], [place] [address]     Labs: CBC: Recent Labs  Lab 07/27/24 0732 07/28/24 0501 07/30/24 9666 07/30/24 9161 07/31/24 0322 07/31/24 1217 08/01/24 0327 08/01/24 1903 08/02/24 0515  WBC 3.3*   < > 2.7* 3.1* 3.1*  --  4.1  --  4.4  NEUTROABS 2.4  --   --   --   --   --   --   --   --   HGB 8.4*   < > 6.8* 8.0* 7.0* 7.2* 6.6* 8.7* 8.1*  HCT 28.1*   < > 22.5* 27.0* 23.3* 24.4* 22.1* 29.5* 26.3*  MCV 80.1   < > 80.4 81.6 80.3  --  80.7  --  80.9  PLT 112*   < > 101* 120* 103*  --  117*  --  130*   < > =  values in this interval not displayed.   Basic Metabolic Panel: Recent Labs  Lab 07/27/24 0732 07/28/24 0501 07/30/24 0838 07/31/24 0322 07/31/24 1217 08/01/24 0327  NA 139 139 139 130* 132* 138  K 4.2 3.9 4.6 3.9  --  4.4  CL 103 104 106 98  --  107  CO2 25 25 25 23   --  24  GLUCOSE 109* 94 126* 108*  --  99  BUN 18 13 12 12   --  10  CREATININE 1.09* 0.87 1.07* 0.98  --  1.31*  CALCIUM  8.4* 8.0* 8.3* 7.5*  --  8.1*  MG 1.8  --   --   --   --   --    Liver Function  Tests: Recent Labs  Lab 07/27/24 0732  AST 153*  ALT 43  ALKPHOS 122  BILITOT 1.1  PROT 7.7  ALBUMIN 3.3*   CBG: No results for input(s): GLUCAP in the last 168 hours.  Discharge time spent:  33 minutes.  Signed: Drue ONEIDA Potter, MD [facility] 08/02/2024

## 2024-08-02 NOTE — TOC Transition Note (Signed)
 Transition of Care [facility]) - Discharge Note   Patient Details  Name: WESLIE RASMUS MRN: [id number] Date of Birth: 12/24/1955  [facility] CM/SW Contact:  Alfonso Rummer, LCSW Phone Number: 08/02/2024, 1:36 PM   Clinical Narrative:     Pt has orders to discharge today. No further concerns. CSW signing off.   Final next level of care: Home/Self Care Barriers to Discharge: Barriers Resolved   Patient Goals and CMS Choice            Discharge Placement                    Patient and family notified of of transfer: 08/02/24  Discharge Plan and Services Additional resources added to the After Visit Summary for                                       Social Drivers of Health (SDOH) Interventions SDOH Screenings   Food Insecurity: No Food Insecurity (07/28/2024)  Housing: Low Risk  (07/28/2024)  Transportation Needs: Unmet Transportation Needs (07/28/2024)  Utilities: Not At Risk (07/28/2024)  Depression (PHQ2-9): Low Risk  (12/08/2023)  Social Connections: Socially Isolated (07/28/2024)  Tobacco Use: Low Risk  (07/27/2024)     Readmission Risk Interventions    08/01/2024   12:57 PM 12/03/2022   12:32 PM  Readmission Risk Prevention Plan  Transportation Screening Complete Complete  PCP or Specialist Appt within 3-5 Days Complete Complete  HRI or Home Care Consult Patient refused   Social Work Consult for Recovery Care Planning/Counseling Complete Complete  Palliative Care Screening Not Applicable Not Applicable  Medication Review Oceanographer) Complete Complete

## 2024-08-18 ENCOUNTER — Ambulatory Visit (INDEPENDENT_AMBULATORY_CARE_PROVIDER_SITE_OTHER)

## 2024-08-18 DIAGNOSIS — I480 Paroxysmal atrial fibrillation: Secondary | ICD-10-CM | POA: Diagnosis not present

## 2024-08-21 LAB — CUP PACEART REMOTE DEVICE CHECK
Date Time Interrogation Session: 20250926145917
Implantable Pulse Generator Implant Date: 20250624

## 2024-08-23 NOTE — Progress Notes (Signed)
 Remote Loop Recorder Transmission

## 2024-08-24 NOTE — Progress Notes (Signed)
 Remote Loop Recorder Transmission

## 2024-08-25 ENCOUNTER — Ambulatory Visit: Payer: Self-pay | Admitting: Cardiology

## 2024-09-18 ENCOUNTER — Ambulatory Visit (INDEPENDENT_AMBULATORY_CARE_PROVIDER_SITE_OTHER)

## 2024-09-18 DIAGNOSIS — I480 Paroxysmal atrial fibrillation: Secondary | ICD-10-CM

## 2024-09-19 LAB — CUP PACEART REMOTE DEVICE CHECK
Date Time Interrogation Session: 20251027145719
Implantable Pulse Generator Implant Date: 20250624

## 2024-09-20 ENCOUNTER — Ambulatory Visit: Payer: Self-pay | Admitting: Cardiology

## 2024-09-26 NOTE — Progress Notes (Signed)
 Remote Loop Recorder Transmission

## 2024-10-19 ENCOUNTER — Encounter

## 2024-10-20 ENCOUNTER — Ambulatory Visit: Attending: Cardiology

## 2024-10-20 DIAGNOSIS — I480 Paroxysmal atrial fibrillation: Secondary | ICD-10-CM

## 2024-10-21 LAB — CUP PACEART REMOTE DEVICE CHECK
Date Time Interrogation Session: 20251127233927
Implantable Pulse Generator Implant Date: 20250624

## 2024-10-23 ENCOUNTER — Ambulatory Visit: Payer: Self-pay | Admitting: Cardiology

## 2024-10-25 NOTE — Progress Notes (Signed)
 Remote Loop Recorder Transmission

## 2024-11-04 ENCOUNTER — Inpatient Hospital Stay

## 2024-11-04 ENCOUNTER — Other Ambulatory Visit: Payer: Self-pay

## 2024-11-04 ENCOUNTER — Emergency Department

## 2024-11-04 ENCOUNTER — Inpatient Hospital Stay
Admission: EM | Admit: 2024-11-04 | Discharge: 2024-11-23 | DRG: 438 | Disposition: E | Attending: Internal Medicine | Admitting: Internal Medicine

## 2024-11-04 DIAGNOSIS — E039 Hypothyroidism, unspecified: Secondary | ICD-10-CM | POA: Diagnosis not present

## 2024-11-04 DIAGNOSIS — J9602 Acute respiratory failure with hypercapnia: Secondary | ICD-10-CM | POA: Diagnosis not present

## 2024-11-04 DIAGNOSIS — K7040 Alcoholic hepatic failure without coma: Secondary | ICD-10-CM | POA: Diagnosis present

## 2024-11-04 DIAGNOSIS — Z883 Allergy status to other anti-infective agents status: Secondary | ICD-10-CM

## 2024-11-04 DIAGNOSIS — I8500 Esophageal varices without bleeding: Secondary | ICD-10-CM | POA: Diagnosis not present

## 2024-11-04 DIAGNOSIS — J4489 Other specified chronic obstructive pulmonary disease: Secondary | ICD-10-CM | POA: Diagnosis present

## 2024-11-04 DIAGNOSIS — D61818 Other pancytopenia: Secondary | ICD-10-CM | POA: Diagnosis present

## 2024-11-04 DIAGNOSIS — Z888 Allergy status to other drugs, medicaments and biological substances status: Secondary | ICD-10-CM

## 2024-11-04 DIAGNOSIS — K852 Alcohol induced acute pancreatitis without necrosis or infection: Secondary | ICD-10-CM | POA: Diagnosis present

## 2024-11-04 DIAGNOSIS — Z91013 Allergy to seafood: Secondary | ICD-10-CM

## 2024-11-04 DIAGNOSIS — I5A Non-ischemic myocardial injury (non-traumatic): Secondary | ICD-10-CM | POA: Diagnosis present

## 2024-11-04 DIAGNOSIS — K862 Cyst of pancreas: Secondary | ICD-10-CM | POA: Diagnosis not present

## 2024-11-04 DIAGNOSIS — I10 Essential (primary) hypertension: Secondary | ICD-10-CM | POA: Diagnosis present

## 2024-11-04 DIAGNOSIS — K7030 Alcoholic cirrhosis of liver without ascites: Secondary | ICD-10-CM | POA: Diagnosis present

## 2024-11-04 DIAGNOSIS — I81 Portal vein thrombosis: Secondary | ICD-10-CM | POA: Diagnosis not present

## 2024-11-04 DIAGNOSIS — K851 Biliary acute pancreatitis without necrosis or infection: Secondary | ICD-10-CM | POA: Diagnosis present

## 2024-11-04 DIAGNOSIS — D62 Acute posthemorrhagic anemia: Secondary | ICD-10-CM | POA: Diagnosis not present

## 2024-11-04 DIAGNOSIS — I255 Ischemic cardiomyopathy: Secondary | ICD-10-CM | POA: Diagnosis present

## 2024-11-04 DIAGNOSIS — F1090 Alcohol use, unspecified, uncomplicated: Secondary | ICD-10-CM | POA: Diagnosis present

## 2024-11-04 DIAGNOSIS — G931 Anoxic brain damage, not elsewhere classified: Secondary | ICD-10-CM | POA: Diagnosis not present

## 2024-11-04 DIAGNOSIS — Z66 Do not resuscitate: Secondary | ICD-10-CM | POA: Diagnosis not present

## 2024-11-04 DIAGNOSIS — K7031 Alcoholic cirrhosis of liver with ascites: Secondary | ICD-10-CM | POA: Diagnosis present

## 2024-11-04 DIAGNOSIS — I700 Atherosclerosis of aorta: Secondary | ICD-10-CM | POA: Diagnosis present

## 2024-11-04 DIAGNOSIS — R9401 Abnormal electroencephalogram [EEG]: Secondary | ICD-10-CM | POA: Diagnosis not present

## 2024-11-04 DIAGNOSIS — I130 Hypertensive heart and chronic kidney disease with heart failure and stage 1 through stage 4 chronic kidney disease, or unspecified chronic kidney disease: Secondary | ICD-10-CM | POA: Diagnosis present

## 2024-11-04 DIAGNOSIS — Z1152 Encounter for screening for COVID-19: Secondary | ICD-10-CM | POA: Diagnosis not present

## 2024-11-04 DIAGNOSIS — D631 Anemia in chronic kidney disease: Secondary | ICD-10-CM | POA: Diagnosis present

## 2024-11-04 DIAGNOSIS — E785 Hyperlipidemia, unspecified: Secondary | ICD-10-CM | POA: Diagnosis not present

## 2024-11-04 DIAGNOSIS — I482 Chronic atrial fibrillation, unspecified: Secondary | ICD-10-CM | POA: Diagnosis not present

## 2024-11-04 DIAGNOSIS — K859 Acute pancreatitis without necrosis or infection, unspecified: Secondary | ICD-10-CM | POA: Diagnosis not present

## 2024-11-04 DIAGNOSIS — I5032 Chronic diastolic (congestive) heart failure: Secondary | ICD-10-CM | POA: Diagnosis present

## 2024-11-04 DIAGNOSIS — R4182 Altered mental status, unspecified: Secondary | ICD-10-CM | POA: Diagnosis not present

## 2024-11-04 DIAGNOSIS — R109 Unspecified abdominal pain: Secondary | ICD-10-CM | POA: Diagnosis present

## 2024-11-04 DIAGNOSIS — J9601 Acute respiratory failure with hypoxia: Secondary | ICD-10-CM | POA: Diagnosis not present

## 2024-11-04 DIAGNOSIS — G9341 Metabolic encephalopathy: Secondary | ICD-10-CM | POA: Diagnosis not present

## 2024-11-04 DIAGNOSIS — Z515 Encounter for palliative care: Secondary | ICD-10-CM | POA: Diagnosis not present

## 2024-11-04 DIAGNOSIS — E872 Acidosis, unspecified: Secondary | ICD-10-CM | POA: Diagnosis not present

## 2024-11-04 DIAGNOSIS — R579 Shock, unspecified: Secondary | ICD-10-CM | POA: Diagnosis not present

## 2024-11-04 DIAGNOSIS — I468 Cardiac arrest due to other underlying condition: Secondary | ICD-10-CM | POA: Diagnosis not present

## 2024-11-04 DIAGNOSIS — R571 Hypovolemic shock: Secondary | ICD-10-CM | POA: Diagnosis not present

## 2024-11-04 DIAGNOSIS — K766 Portal hypertension: Secondary | ICD-10-CM | POA: Diagnosis present

## 2024-11-04 DIAGNOSIS — I2489 Other forms of acute ischemic heart disease: Secondary | ICD-10-CM | POA: Diagnosis present

## 2024-11-04 DIAGNOSIS — K921 Melena: Secondary | ICD-10-CM | POA: Diagnosis not present

## 2024-11-04 DIAGNOSIS — I480 Paroxysmal atrial fibrillation: Secondary | ICD-10-CM | POA: Diagnosis present

## 2024-11-04 DIAGNOSIS — I469 Cardiac arrest, cause unspecified: Secondary | ICD-10-CM | POA: Diagnosis not present

## 2024-11-04 DIAGNOSIS — N183 Chronic kidney disease, stage 3 unspecified: Secondary | ICD-10-CM | POA: Diagnosis present

## 2024-11-04 DIAGNOSIS — D509 Iron deficiency anemia, unspecified: Secondary | ICD-10-CM | POA: Diagnosis present

## 2024-11-04 DIAGNOSIS — Z825 Family history of asthma and other chronic lower respiratory diseases: Secondary | ICD-10-CM

## 2024-11-04 DIAGNOSIS — K7200 Acute and subacute hepatic failure without coma: Secondary | ICD-10-CM | POA: Diagnosis not present

## 2024-11-04 DIAGNOSIS — Z79890 Hormone replacement therapy (postmenopausal): Secondary | ICD-10-CM

## 2024-11-04 DIAGNOSIS — R569 Unspecified convulsions: Secondary | ICD-10-CM | POA: Diagnosis not present

## 2024-11-04 DIAGNOSIS — J449 Chronic obstructive pulmonary disease, unspecified: Secondary | ICD-10-CM | POA: Diagnosis not present

## 2024-11-04 DIAGNOSIS — F1010 Alcohol abuse, uncomplicated: Secondary | ICD-10-CM | POA: Diagnosis present

## 2024-11-04 DIAGNOSIS — Z8249 Family history of ischemic heart disease and other diseases of the circulatory system: Secondary | ICD-10-CM

## 2024-11-04 DIAGNOSIS — K922 Gastrointestinal hemorrhage, unspecified: Secondary | ICD-10-CM | POA: Diagnosis not present

## 2024-11-04 DIAGNOSIS — Z5982 Transportation insecurity: Secondary | ICD-10-CM

## 2024-11-04 DIAGNOSIS — R4020 Unspecified coma: Secondary | ICD-10-CM | POA: Diagnosis not present

## 2024-11-04 DIAGNOSIS — Z886 Allergy status to analgesic agent status: Secondary | ICD-10-CM

## 2024-11-04 DIAGNOSIS — Z79899 Other long term (current) drug therapy: Secondary | ICD-10-CM

## 2024-11-04 DIAGNOSIS — Z7901 Long term (current) use of anticoagulants: Secondary | ICD-10-CM | POA: Diagnosis not present

## 2024-11-04 DIAGNOSIS — R578 Other shock: Secondary | ICD-10-CM | POA: Diagnosis not present

## 2024-11-04 DIAGNOSIS — N1831 Chronic kidney disease, stage 3a: Secondary | ICD-10-CM | POA: Diagnosis present

## 2024-11-04 DIAGNOSIS — K7290 Hepatic failure, unspecified without coma: Secondary | ICD-10-CM | POA: Diagnosis not present

## 2024-11-04 DIAGNOSIS — K8020 Calculus of gallbladder without cholecystitis without obstruction: Secondary | ICD-10-CM | POA: Diagnosis present

## 2024-11-04 DIAGNOSIS — Z885 Allergy status to narcotic agent status: Secondary | ICD-10-CM

## 2024-11-04 DIAGNOSIS — J45909 Unspecified asthma, uncomplicated: Secondary | ICD-10-CM | POA: Diagnosis present

## 2024-11-04 DIAGNOSIS — I8510 Secondary esophageal varices without bleeding: Secondary | ICD-10-CM | POA: Diagnosis present

## 2024-11-04 DIAGNOSIS — D696 Thrombocytopenia, unspecified: Secondary | ICD-10-CM | POA: Diagnosis not present

## 2024-11-04 DIAGNOSIS — K760 Fatty (change of) liver, not elsewhere classified: Secondary | ICD-10-CM | POA: Diagnosis present

## 2024-11-04 DIAGNOSIS — R791 Abnormal coagulation profile: Secondary | ICD-10-CM | POA: Diagnosis present

## 2024-11-04 DIAGNOSIS — G4089 Other seizures: Secondary | ICD-10-CM | POA: Diagnosis not present

## 2024-11-04 DIAGNOSIS — Z7189 Other specified counseling: Secondary | ICD-10-CM | POA: Diagnosis not present

## 2024-11-04 LAB — COMPREHENSIVE METABOLIC PANEL WITH GFR
ALT: 63 U/L — ABNORMAL HIGH (ref 0–44)
AST: 395 U/L — ABNORMAL HIGH (ref 15–41)
Albumin: 3.5 g/dL (ref 3.5–5.0)
Alkaline Phosphatase: 239 U/L — ABNORMAL HIGH (ref 38–126)
Anion gap: 16 — ABNORMAL HIGH (ref 5–15)
BUN: 15 mg/dL (ref 8–23)
CO2: 21 mmol/L — ABNORMAL LOW (ref 22–32)
Calcium: 8.9 mg/dL (ref 8.9–10.3)
Chloride: 103 mmol/L (ref 98–111)
Creatinine, Ser: 1.06 mg/dL — ABNORMAL HIGH (ref 0.44–1.00)
GFR, Estimated: 57 mL/min — ABNORMAL LOW (ref >60)
Glucose, Bld: 76 mg/dL (ref 70–99)
Potassium: 4.8 mmol/L (ref 3.5–5.1)
Sodium: 140 mmol/L (ref 135–145)
Total Bilirubin: 1.6 mg/dL — ABNORMAL HIGH (ref 0.0–1.2)
Total Protein: 7.7 g/dL (ref 6.5–8.1)

## 2024-11-04 LAB — PHOSPHORUS: Phosphorus: 3.7 mg/dL (ref 2.5–4.6)

## 2024-11-04 LAB — URINALYSIS, ROUTINE W REFLEX MICROSCOPIC
Bilirubin Urine: NEGATIVE
Glucose, UA: NEGATIVE mg/dL
Hgb urine dipstick: NEGATIVE
Ketones, ur: NEGATIVE mg/dL
Leukocytes,Ua: NEGATIVE
Nitrite: NEGATIVE
Protein, ur: NEGATIVE mg/dL
Specific Gravity, Urine: >1.046 — ABNORMAL HIGH (ref 1.005–1.030)
pH: 5 (ref 5.0–8.0)

## 2024-11-04 LAB — CBC
HCT: 27.7 % — ABNORMAL LOW (ref 36.0–46.0)
Hemoglobin: 9.1 g/dL — ABNORMAL LOW (ref 12.0–15.0)
MCH: 30.6 pg (ref 26.0–34.0)
MCHC: 32.9 g/dL (ref 30.0–36.0)
MCV: 93.3 fL (ref 80.0–100.0)
Platelets: 56 K/uL — ABNORMAL LOW (ref 150–400)
RBC: 2.97 MIL/uL — ABNORMAL LOW (ref 3.87–5.11)
RDW: 22.8 % — ABNORMAL HIGH (ref 11.5–15.5)
WBC: 2.7 K/uL — ABNORMAL LOW (ref 4.0–10.5)
nRBC: 3 % — ABNORMAL HIGH (ref 0.0–0.2)

## 2024-11-04 LAB — PROTIME-INR
INR: 1.5 — ABNORMAL HIGH (ref 0.8–1.2)
Prothrombin Time: 18.4 s — ABNORMAL HIGH (ref 11.4–15.2)

## 2024-11-04 LAB — LIPASE, BLOOD
Lipase: 637 U/L — ABNORMAL HIGH (ref 11–51)
Lipase: 434 U/L — ABNORMAL HIGH (ref 11–51)

## 2024-11-04 LAB — MAGNESIUM: Magnesium: 2 mg/dL (ref 1.7–2.4)

## 2024-11-04 LAB — TRIGLYCERIDES: Triglycerides: 90 mg/dL (ref <150)

## 2024-11-04 LAB — AMMONIA: Ammonia: 43 umol/L — ABNORMAL HIGH (ref 9–35)

## 2024-11-04 LAB — BILIRUBIN, DIRECT: Bilirubin, Direct: 1.2 mg/dL — ABNORMAL HIGH (ref 0.0–0.2)

## 2024-11-04 LAB — TROPONIN T, HIGH SENSITIVITY
Troponin T High Sensitivity: 19 ng/L (ref 0–19)
Troponin T High Sensitivity: 21 ng/L — ABNORMAL HIGH (ref 0–19)

## 2024-11-04 LAB — TYPE AND SCREEN
ABO/RH(D): A POS
Antibody Screen: NEGATIVE

## 2024-11-04 LAB — PRO BRAIN NATRIURETIC PEPTIDE: Pro Brain Natriuretic Peptide: 279 pg/mL (ref <300.0)

## 2024-11-04 LAB — APTT: aPTT: 39 s — ABNORMAL HIGH (ref 24–36)

## 2024-11-04 MED ORDER — FERROUS SULFATE 325 (65 FE) MG PO TABS
325.0000 mg | ORAL_TABLET | Freq: Every day | ORAL | Status: DC
  Administered 2024-11-05 – 2024-11-07 (×3): 325 mg via ORAL
  Filled 2024-11-04 (×3): qty 1

## 2024-11-04 MED ORDER — MORPHINE SULFATE (PF) 4 MG/ML IV SOLN
4.0000 mg | Freq: Once | INTRAVENOUS | Status: AC
  Administered 2024-11-04: 4 mg via INTRAVENOUS
  Filled 2024-11-04: qty 1

## 2024-11-04 MED ORDER — DM-GUAIFENESIN ER 30-600 MG PO TB12
1.0000 | ORAL_TABLET | Freq: Two times a day (BID) | ORAL | Status: DC | PRN
  Administered 2024-11-06: 09:00:00 1 via ORAL
  Filled 2024-11-04: qty 1

## 2024-11-04 MED ORDER — LORAZEPAM 2 MG/ML IJ SOLN
0.0000 mg | Freq: Two times a day (BID) | INTRAMUSCULAR | Status: DC
  Administered 2024-11-08: 05:00:00 2 mg via INTRAVENOUS
  Filled 2024-11-04: qty 1

## 2024-11-04 MED ORDER — LACTULOSE 10 GM/15ML PO SOLN
20.0000 g | Freq: Two times a day (BID) | ORAL | Status: DC
  Administered 2024-11-05 – 2024-11-06 (×3): 20 g via ORAL
  Filled 2024-11-04 (×4): qty 30

## 2024-11-04 MED ORDER — SODIUM CHLORIDE 0.9 % IV SOLN: INTRAVENOUS | Status: DC

## 2024-11-04 MED ORDER — ADULT MULTIVITAMIN W/MINERALS CH
1.0000 | ORAL_TABLET | Freq: Every day | ORAL | Status: DC
  Administered 2024-11-05 – 2024-11-07 (×3): 1 via ORAL
  Filled 2024-11-04 (×3): qty 1

## 2024-11-04 MED ORDER — HEPARIN (PORCINE) 25000 UT/250ML-% IV SOLN
1100.0000 [IU]/h | INTRAVENOUS | Status: DC
  Administered 2024-11-05: 1100 [IU]/h via INTRAVENOUS
  Filled 2024-11-04: qty 250

## 2024-11-04 MED ORDER — ACETAMINOPHEN 325 MG PO TABS: 325.0000 mg | ORAL_TABLET | Freq: Four times a day (QID) | ORAL | Status: DC | PRN

## 2024-11-04 MED ORDER — LORAZEPAM 2 MG/ML IJ SOLN: 1.0000 mg | INTRAMUSCULAR | Status: AC | PRN

## 2024-11-04 MED ORDER — LIDOCAINE 5 % EX PTCH
1.0000 | MEDICATED_PATCH | CUTANEOUS | Status: DC
  Administered 2024-11-04 – 2024-11-07 (×4): 1 via TRANSDERMAL
  Filled 2024-11-04 (×8): qty 1

## 2024-11-04 MED ORDER — ONDANSETRON HCL 4 MG/2ML IJ SOLN
4.0000 mg | Freq: Once | INTRAMUSCULAR | Status: AC
  Administered 2024-11-04: 4 mg via INTRAVENOUS
  Filled 2024-11-04: qty 2

## 2024-11-04 MED ORDER — IOHEXOL 300 MG/ML  SOLN
100.0000 mL | Freq: Once | INTRAMUSCULAR | Status: AC | PRN
  Administered 2024-11-04: 100 mL via INTRAVENOUS

## 2024-11-04 MED ORDER — HYDRALAZINE HCL 20 MG/ML IJ SOLN: 5.0000 mg | INTRAMUSCULAR | Status: DC | PRN

## 2024-11-04 MED ORDER — THIAMINE HCL 100 MG/ML IJ SOLN
100.0000 mg | Freq: Every day | INTRAMUSCULAR | Status: DC
  Administered 2024-11-04 – 2024-11-11 (×5): 100 mg via INTRAVENOUS
  Filled 2024-11-04 (×5): qty 2

## 2024-11-04 MED ORDER — ONDANSETRON HCL 4 MG/2ML IJ SOLN
4.0000 mg | Freq: Three times a day (TID) | INTRAMUSCULAR | Status: DC | PRN
  Administered 2024-11-04 – 2024-11-06 (×2): 4 mg via INTRAVENOUS
  Filled 2024-11-04 (×2): qty 2

## 2024-11-04 MED ORDER — LORAZEPAM 1 MG PO TABS: 1.0000 mg | ORAL_TABLET | ORAL | Status: AC | PRN

## 2024-11-04 MED ORDER — ALBUTEROL SULFATE HFA 108 (90 BASE) MCG/ACT IN AERS: 2.0000 | INHALATION_SPRAY | RESPIRATORY_TRACT | Status: DC | PRN

## 2024-11-04 MED ORDER — THIAMINE MONONITRATE 100 MG PO TABS
100.0000 mg | ORAL_TABLET | Freq: Every day | ORAL | Status: DC
  Administered 2024-11-05 – 2024-11-07 (×3): 100 mg via ORAL
  Filled 2024-11-04 (×3): qty 1

## 2024-11-04 MED ORDER — LEVOTHYROXINE SODIUM 112 MCG PO TABS
112.0000 ug | ORAL_TABLET | Freq: Every day | ORAL | Status: DC
  Administered 2024-11-05 – 2024-11-07 (×3): 112 ug via ORAL
  Filled 2024-11-04 (×4): qty 1

## 2024-11-04 MED ORDER — SENNOSIDES-DOCUSATE SODIUM 8.6-50 MG PO TABS: 1.0000 | ORAL_TABLET | Freq: Every evening | ORAL | Status: DC | PRN

## 2024-11-04 MED ORDER — LORAZEPAM 2 MG/ML IJ SOLN: 0.0000 mg | Freq: Four times a day (QID) | INTRAMUSCULAR | Status: DC

## 2024-11-04 MED ORDER — PANTOPRAZOLE SODIUM 40 MG PO TBEC
40.0000 mg | DELAYED_RELEASE_TABLET | Freq: Every day | ORAL | Status: DC
  Administered 2024-11-05 – 2024-11-07 (×3): 40 mg via ORAL
  Filled 2024-11-04 (×3): qty 1

## 2024-11-04 MED ORDER — MORPHINE SULFATE (PF) 2 MG/ML IV SOLN
2.0000 mg | INTRAVENOUS | Status: DC | PRN
  Administered 2024-11-04 – 2024-11-07 (×2): 2 mg via INTRAVENOUS
  Filled 2024-11-04 (×2): qty 1

## 2024-11-04 MED ORDER — FOLIC ACID 1 MG PO TABS
1.0000 mg | ORAL_TABLET | Freq: Every day | ORAL | Status: DC
  Administered 2024-11-05 – 2024-11-07 (×3): 1 mg via ORAL
  Filled 2024-11-04 (×3): qty 1

## 2024-11-04 MED ORDER — ALBUTEROL SULFATE (2.5 MG/3ML) 0.083% IN NEBU: 2.5000 mg | INHALATION_SOLUTION | RESPIRATORY_TRACT | Status: DC | PRN

## 2024-11-04 NOTE — H&P (Signed)
 History and Physical    [name] FMW:[id number] DOB: [birth year] DOA: 11/04/2024  Referring MD/NP/PA:   PCP: Odell Chard, Edra GRADE, MD   Patient coming from:  The patient is coming from home.     Chief Complaint: Left flank pain, lower abdominal pain  HPI: Kathryn [name] is a 68 y.o. female with medical history significant of alcohol abuse, alcoholic liver cirrhosis, esophageal varices, portal venous hypertension, portal vein thrombosis A-fib on Eliquis , colitis, sarcoidosis, bicytopenia, GI bleeding, iron  deficiency anemia, HTN, HLD, asthma and COPD,  dCHF, loop recorder in place per pt,  who presents with left flank pain, lower abdominal pain.  Patient states that her symptoms started last night, including left flank pain and left lower abdominal pain, nausea with few episodes of nonbilious nonbloody vomiting.  No diarrhea.  No fever or chills.  Her left flank pain and lower abdominal pain is constant, aching, moderate to severe, nonradiating, not aggravated or alleviated by any known factors.  Patient has mild dry cough, mild SOB, no chest pain.  Denies symptoms of UTI.  His last drink of vodka was yesterday evening.  He took Eliquis  yesterday afternoon.  Data reviewed independently and ED Course: pt was found to have lipase 637, liver function (ALP 239, AST 395, ALT 63, total bilirubin 1.6), pancytopenia with WBC 2.7, hemoglobin 9.1 and platelets 56 (WBC 4.4, hemoglobin 8.1, platelet 130 on 08/02/2024), renal function close to baseline, negative UA, troponin 19 --> 21, BNP 279, ammonia level 43.  Temperature normal, blood pressure 106/68, heart rate 45-56, RR 16, oxygen saturation 80% on room air which improved to 100% on 2 L oxygen (patient is not using oxygen at baseline).  Patient is admitted to telemetry bed as inpatient.  Dr. Cesar of surgery is consulted.   CT of abdomen/pelvis: 1. Profound hepatic steatosis with nodular contours of the liver, suggestive of cirrhosis. There is  sequela of portal hypertension including small volume ascites, multiple abdominal collaterals, and favored portal colopathy. 2. Cholelithiasis with similar circumferential wall prominence of the gallbladder compared to priors. This is favored to reflect sequela of underlying liver disease. If there is clinical concern for acute cholecystitis, recommend dedicated RIGHT upper quadrant ultrasound. 3. Nonspecific fat stranding throughout the epigastric area. This could reflect sequela of portal hypertension, pancreatitis or sequela of duodenitis. Recommend correlation with lipase levels. 4. There is wall thickening of the stomach, duodenum and colon. This is nonspecific and could reflect a nonspecific enteritis or sequela of liver disease. 5. There are several small hypoenhancing areas along the pancreatic body and tail measuring approximately 5 mm. These are nonspecific and could reflect small pseudocysts or side branch IPMNs. Recommend further evaluation with nonemergent outpatient pancreatic protocol MRI with and without contrast. 6. There is heterogeneous expansion of the endometrial canal spanning approximately 12 mm. Recommend further evaluation with pelvic ultrasound when clinically appropriate.   Aortic Atherosclerosis (ICD10-I70.0).  Ultrasound-RUQ: 1. Gallstones in the neck of the gallbladder with borderline wall thickening and cholecystic fluid. Negative sonographic murphy sign. Correlate clinically for acute cholecystitis. 2. Nodular liver contour with increased, coarsened echotexture, compatible with cirrhosis; evaluation limited by increased echogenicity. 3. Small amount of free fluid in the right upper quadrant.      EKG: I have personally reviewed.  Regular, QTc 299, heart rate 56, nonspecific T wave change.   Review of Systems:   General: no fevers, chills, no body weight gain, has poor appetite, has fatigue HEENT: no blurry vision, hearing changes or sore  throat Respiratory: no dyspnea, coughing, wheezing CV: no chest pain, no palpitations GI: has nausea, vomiting, lower abdominal pain, no diarrhea, constipation GU: no dysuria, burning on urination, increased urinary frequency, hematuria  Ext: has trace leg edema Neuro: no unilateral weakness, numbness, or tingling, no vision change or hearing loss Skin: no rash, no skin tear. MSK: No muscle spasm, no deformity, no limitation of range of movement in spin. Has left flank pain Heme: No easy bruising.  Travel history: No recent long distant travel.   Allergy: Allergies[1]  Past Medical History:  Diagnosis Date   Alcoholic cirrhosis of liver (HCC)    Asthma    CHF (congestive heart failure) (HCC)    Chronic disease anemia    Esophageal varices (HCC)    GR I on EGD by Dr Jeri 09/2014   ETOH abuse    Hypertension    Hypothyroidism    Murmur    Portal hypertension (HCC)    Sarcoidosis     Past Surgical History:  Procedure Laterality Date   COLONOSCOPY N/A 07/29/2024   Procedure: COLONOSCOPY;  Surgeon: Maryruth Ole DASEN, MD;  Location: [facility] ENDOSCOPY;  Service: Endoscopy;  Laterality: N/A;   ESOPHAGOGASTRODUODENOSCOPY N/A 06/26/2021   Procedure: ESOPHAGOGASTRODUODENOSCOPY (EGD);  Surgeon: Toledo, Ladell POUR, MD;  Location: [facility] ENDOSCOPY;  Service: Gastroenterology;  Laterality: N/A;   ESOPHAGOGASTRODUODENOSCOPY N/A 07/29/2024   Procedure: EGD (ESOPHAGOGASTRODUODENOSCOPY);  Surgeon: Maryruth Ole DASEN, MD;  Location: [facility] ENDOSCOPY;  Service: Endoscopy;  Laterality: N/A;   ESOPHAGOGASTRODUODENOSCOPY (EGD) WITH PROPOFOL  N/A 04/04/2015   Procedure: ESOPHAGOGASTRODUODENOSCOPY (EGD) WITH PROPOFOL ;  Surgeon: Rogelia Copping, MD;  Location: [facility] ENDOSCOPY;  Service: Endoscopy;  Laterality: N/A;  Dr COPPING prefers 1445   ESOPHAGOGASTRODUODENOSCOPY (EGD) WITH PROPOFOL  N/A 12/04/2022   Procedure: ESOPHAGOGASTRODUODENOSCOPY (EGD) WITH PROPOFOL ;  Surgeon: Therisa Bi, MD;  Location: [facility] ENDOSCOPY;   Service: Gastroenterology;  Laterality: N/A;   ESOPHAGOGASTRODUODENOSCOPY W/ BANDING  09/2014   Rein   LEFT HEART CATH AND CORONARY ANGIOGRAPHY N/A 02/21/2024   Procedure: LEFT HEART CATH AND CORONARY ANGIOGRAPHY;  Surgeon: Mady Bruckner, MD;  Location: [facility] INVASIVE CV LAB;  Service: Cardiovascular;  Laterality: N/A;   TUBAL LIGATION      Social History:  reports that she has never smoked. She has never used smokeless tobacco. She reports current alcohol use of about 15.0 standard drinks of alcohol per week. She reports that she does not use drugs.  Family History:  Family History  Problem Relation Age of Onset   Hypertension Mother    Heart attack Mother    Aneurysm Father    COPD Father    Cerebral aneurysm Father    Hypertension Sister    Hypertension Brother      Prior to Admission medications  Medication Sig Start Date End Date Taking? Authorizing Provider  albuterol  (VENTOLIN  HFA) 108 (90 Base) MCG/ACT inhaler Inhale 1-2 puffs into the lungs every 4 (four) hours as needed for wheezing or shortness of breath. 06/14/23   [provider]  apixaban  (ELIQUIS ) 5 MG TABS tablet Take 1 tablet (5 mg total) by mouth 2 (two) times daily. 02/22/24   Lenon Marien CROME, MD  atorvastatin  (LIPITOR) 10 MG tablet Take 1 tablet (10 mg total) by mouth daily. Patient not taking: Reported on 07/27/2024 02/23/24   Lenon Marien CROME, MD  Blood Glucose Monitoring Suppl (BLOOD GLUCOSE MONITOR SYSTEM) w/Device KIT as directed as directed daily for 30 days 06/08/24   [provider]  ferrous sulfate  325 (65 FE) MG tablet Take  1 tablet (325 mg total) by mouth daily with breakfast. 08/03/24   Dorinda Drue DASEN, MD  folic acid  (FOLVITE ) 1 MG tablet Take 1 tablet (1 mg total) by mouth daily. 08/03/24   Dorinda Drue DASEN, MD  levothyroxine  (SYNTHROID ) 112 MCG tablet 1 tablet in the morning on an empty stomach Orally Once a day for 90 days 04/26/24   [provider]  Multiple Vitamin  (MULTIVITAMIN) tablet Take 1 tablet by mouth every other day. Patient not taking: Reported on 04/26/2024    [provider]  nadolol  (CORGARD ) 80 MG tablet Take by mouth daily. For 90 days    [provider]  pantoprazole  (PROTONIX ) 40 MG tablet Take 1 tablet (40 mg total) by mouth daily. 08/03/24   Dorinda Drue DASEN, MD  senna-docusate (SENOKOT-S) 8.6-50 MG tablet Take 1 tablet by mouth at bedtime as needed for mild constipation. 08/02/24   Dorinda Drue DASEN, MD  thiamine  (VITAMIN B-1) 100 MG tablet Take 1 tablet (100 mg total) by mouth daily. 02/23/24   Lenon Marien CROME, MD    Physical Exam: Vitals:   11/04/24 1752 11/04/24 1951 11/04/24 2005 11/04/24 2050  BP:  (!) 110/98 116/77 122/68  Pulse: (!) 45 (!) 44 (!) 45 (!) 41  Resp:  16 18 15   Temp:   97.8 F (36.6 C) 97.8 F (36.6 C)  TempSrc:  Oral Oral   SpO2: 100% 100% (!) 9% 100%  Weight:   82.6 kg   Height:   5' 4 (1.626 m)    General: Not in acute distress HEENT:       Eyes: PERRL, EOMI, no jaundice       ENT: No discharge from the ears and nose, no pharynx injection, no tonsillar enlargement.        Neck: No JVD, no bruit, no mass felt. Heme: No neck lymph node enlargement. Cardiac: S1/S2, RRR, No gallops or rubs. Respiratory: No rales, wheezing, rhonchi or rubs. GI: Soft, nondistended, has tenderness in lower abdomen, no rebound pain, no organomegaly, BS present. GU: No hematuria Ext: has trace leg edema bilaterally. 1+DP/PT pulse bilaterally. Musculoskeletal: No joint deformities, No joint redness or warmth, no limitation of ROM in spin. Skin: No rashes.  Neuro: Alert, oriented X3, cranial nerves II-XII grossly intact, moves all extremities normally.  Psych: Patient is not psychotic, no suicidal or hemocidal ideation.  Labs on Admission: I have personally reviewed following labs and imaging studies  CBC: Recent Labs  Lab 11/04/24 1426  WBC 2.7*  HGB 9.1*  HCT 27.7*  MCV 93.3  PLT 56*   Basic  Metabolic Panel: Recent Labs  Lab 11/04/24 1208 11/04/24 1940  NA 140  --   K 4.8  --   CL 103  --   CO2 21*  --   GLUCOSE 76  --   BUN 15  --   CREATININE 1.06*  --   CALCIUM  8.9  --   MG  --  2.0  PHOS  --  3.7   GFR: Estimated Creatinine Clearance: 52.8 mL/min (A) (by C-G formula based on SCr of 1.06 mg/dL (H)). Liver Function Tests: Recent Labs  Lab 11/04/24 1208  AST 395*  ALT 63*  ALKPHOS 239*  BILITOT 1.6*  PROT 7.7  ALBUMIN 3.5   Recent Labs  Lab 11/04/24 1208 11/04/24 1940  LIPASE 637* 434*   Recent Labs  Lab 11/04/24 1940  AMMONIA 43*   Coagulation Profile: Recent Labs  Lab 11/04/24 1940  INR  1.5*   Cardiac Enzymes: No results for input(s): CKTOTAL, CKMB, CKMBINDEX, TROPONINI in the last 168 hours. BNP (last 3 results) Recent Labs    11/04/24 1940  PROBNP 279.0   HbA1C: No results for input(s): HGBA1C in the last 72 hours. CBG: No results for input(s): GLUCAP in the last 168 hours. Lipid Profile: Recent Labs    11/04/24 1940  TRIG 90   Thyroid Function Tests: No results for input(s): TSH, T4TOTAL, FREET4, T3FREE, THYROIDAB in the last 72 hours. Anemia Panel: No results for input(s): VITAMINB12, FOLATE, FERRITIN, TIBC, IRON , RETICCTPCT in the last 72 hours. Urine analysis:    Component Value Date/Time   COLORURINE YELLOW (A) 11/04/2024 1430   APPEARANCEUR CLOUDY (A) 11/04/2024 1430   APPEARANCEUR Clear 09/25/2014 1610   LABSPEC >1.046 (H) 11/04/2024 1430   LABSPEC 1.023 09/25/2014 1610   PHURINE 5.0 11/04/2024 1430   GLUCOSEU NEGATIVE 11/04/2024 1430   GLUCOSEU Negative 09/25/2014 1610   HGBUR NEGATIVE 11/04/2024 1430   BILIRUBINUR NEGATIVE 11/04/2024 1430   BILIRUBINUR Negative 09/25/2014 1610   KETONESUR NEGATIVE 11/04/2024 1430   PROTEINUR NEGATIVE 11/04/2024 1430   NITRITE NEGATIVE 11/04/2024 1430   LEUKOCYTESUR NEGATIVE 11/04/2024 1430   LEUKOCYTESUR Negative 09/25/2014 1610    Sepsis Labs: @LABRCNTIP (procalcitonin:4,lacticidven:4) )No results found for this or any previous visit (from the past 240 hours).   Radiological Exams on Admission:   Assessment/Plan Principal Problem:   Acute gallstone pancreatitis Active Problems:   Alcoholic pancreatitis   Alcohol use disorder   Alcoholic cirrhosis of liver (HCC)   Pancytopenia (HCC)   Portal vein thrombosis   Hypertension   Atrial fibrillation, chronic (HCC)   Chronic diastolic CHF (congestive heart failure) (HCC)   Hypothyroidism   CKD (chronic kidney disease), stage IIIa   Hyperlipemia   Pancreatic cyst   Asthma   COPD (chronic obstructive pulmonary disease) (HCC)   Myocardial injury   Assessment and Plan:   Acute gallstone pancreatitis vs. alcoholic pancreatitis: Lipase 362. CT scan and US -RUQ showed gallstone.  No common duct dilation by ultrasound-RUQ.  Patient does not have fever or leukocytosis, will hold off antibiotics now. Consulted Dr. Cesar of surgery, who recommended MRCP, however patient has loop recorder in place, will not be able to do MRCP.  -will admit to tele bed as inpt -NPO now - As needed morphine  for pain - IV fluid: 75 cc/h for normal saline - Repeat lipase in AM  Alcohol use disorder - Did counseling about importance of quitting alcohol use - CIWA protocol  Alcoholic cirrhosis of liver: liver function with ALP 239, AST 395, ALT 63, total bilirubin 1.6.  Ammonia slightly elevated at 43. - Check INR/PTT - Lactulose  20 g twice daily - Hold nadolol  due to bradycardia  Pancytopenia (HCC): Hgb stable -f/u with CBC  Portal vein thrombosis -Hold Eliquis  in case patient needed surgery - Start IV heparin   Hypertension -IV hydralazine  as needed - Hold on nadolol  due to bradycardia  Atrial fibrillation, chronic [facility]): Patient has bradycardia, heart rate 40-50s. -Switched Eliquis  to IV heparin  as above - Hold nadolol   Chronic diastolic CHF (congestive heart  failure) (HCC): 2D echo on 02/20/2024 showed EF of 65-70%.  Patient has trace leg edema, no JVD.  CHF seem to be compensated. -Check BNP --> 279  Hypothyroidism -Synthroid   CKD (chronic kidney disease), stage IIIa: Renal function stable.  Recent baseline creatinine 1.31 on 08/01/2024.  Her creatinine is 1.06, BUN 15, GFR 57. -Follow-up with BMP  Hyperlipemia - Patient is  not taking Lipitor - Follow-up with PCP  Pancreatic cyst: pt has Loop recorder, cannot do MRI. -may f/u with PCP as outpt to repeat CT scan  Asthma and COPD (chronic obstructive pulmonary disease) (HCC): Patient has a mild dry cough, mild SOB.  No chest pain.  Patient does not use oxygen normally, but has oxygen desaturation to 80% on room air, which improved to 100% on 2 L of oxygen.  Patient does not have respiratory distress.  BNP 279, no signs for CHF exacerbation.  Patient is taking Eliquis , low suspicions for PE.  No wheezing or rhonchi on auscultation.  Does not seem to have COPD exacerbation. - Check RESP panel -f/u CXR - As needed albuterol  and Mucinex   Myocardial injury: Troponin 19 - > 21, likely due to demand ischemia.  No chest pain. -Observe closely for any signs of chest pain.  Abnormal findings of CT scan: CT abdomen/pelvis showed heterogeneous expansion of the endometrial canal spanning approximately 12 mm. Recommend further evaluation with pelvic ultrasound when clinically appropriate. -may need to give referral to OB/GYN at discharge or f/u OB/Gyn as out pt     DVT ppx: on IV heparin   Code Status: Full code     Family Communication:     not done, no family member is at bed side.      Disposition Plan:  Anticipate discharge back to previous environment  Consults called:  Dr. Cesar of surgery is consulted.  Admission status and Level of care: Telemetry: as inpt        Dispo: The patient is from: Home              Anticipated d/c is to: Home              Anticipated d/c date is: 2 days               Patient currently is not medically stable to d/c.    Severity of Illness:  The appropriate patient status for this patient is INPATIENT. Inpatient status is judged to be reasonable and necessary in order to provide the required intensity of service to ensure the patient's safety. The patient's presenting symptoms, physical exam findings, and initial radiographic and laboratory data in the context of their chronic comorbidities is felt to place them at high risk for further clinical deterioration. Furthermore, it is not anticipated that the patient will be medically stable for discharge from the hospital within 2 midnights of admission.   * I certify that at the point of admission it is my clinical judgment that the patient will require inpatient hospital care spanning beyond 2 midnights from the point of admission due to high intensity of service, high risk for further deterioration and high frequency of surveillance required.*       Date of Service 11/04/2024    Caleb Exon [facility]   If 7PM-7AM, please contact night-coverage [email or web address] 11/04/2024, 11:07 PM     [1]  Allergies Allergen Reactions   Oxycodone -Acetaminophen     Emetrol     Other Reaction(s): critical   Hydrocodone-Acetaminophen  Other (See Comments)    Reaction: pt can't take anything with tylenol  due to her liver disease.    Ibuprofen Other (See Comments)    Esophageal varices   Naproxen Other (See Comments)    Esophageal varices   Oseltamivir     Other Reaction(s): critical   Propoxyphene Other (See Comments)    Reaction: pt isn't sure, but knows she can't take it.  Shellfish Allergy Hives   Sodium Ferric Gluconate [Ferrous Gluconate]     IV IRON    Tamiflu  [Oseltamivir Phosphate] Other (See Comments)    Esophageal varices

## 2024-11-04 NOTE — ED Triage Notes (Addendum)
 Pt BIB EMS for stabbing chest pain that originates substernal and radiates to the left. Symptoms began last night per patient, and pain is currently 10/10. Also reports dizziness. PMHx HTN, CHF, and alcoholic cirrhosis of liver. Last drink (vodka), yesterday evening around 6 pm per patient.

## 2024-11-04 NOTE — Plan of Care (Signed)

## 2024-11-04 NOTE — Progress Notes (Signed)
 ANTICOAGULATION CONSULT NOTE  Pharmacy Consult for heparin  infusion Indication: Hx of A fib and portal vein thrombosis on Eliquis , now has gallstone, in case patient needs surgery.   Allergies[1]  Patient Measurements: Height: 5' 4 (162.6 cm) Weight: 82.6 kg (182 lb 1.6 oz) IBW/kg (Calculated) : 54.7 HEPARIN  DW (KG): 72.6  Vital Signs: Temp: 97.8 F (36.6 C) (12/13 2050) Temp Source: Oral (12/13 2005) BP: 122/68 (12/13 2050) Pulse Rate: 41 (12/13 2050)  Labs: Recent Labs    11/04/24 1208 11/04/24 1426 11/04/24 1940  HGB  --  9.1*  --   HCT  --  27.7*  --   PLT  --  56*  --   APTT  --   --  39*  LABPROT  --   --  18.4*  INR  --   --  1.5*  CREATININE 1.06*  --   --     Estimated Creatinine Clearance: 52.8 mL/min (A) (by C-G formula based on SCr of 1.06 mg/dL (H)).   Medical History: Past Medical History:  Diagnosis Date   Alcoholic cirrhosis of liver (HCC)    Asthma    CHF (congestive heart failure) (HCC)    Chronic disease anemia    Esophageal varices (HCC)    GR I on EGD by Dr Jeri 09/2014   ETOH abuse    Hypertension    Hypothyroidism    Murmur    Portal hypertension (HCC)    Sarcoidosis     Medications:  PTA Meds: Eliquis , last dose unknown  Assessment: Pt is a 68 yo female presenting to ED c/o lower abdominal pain found with acute cholecystitis.  Pt with hx of A fib and portal vein thrombosis on Eliquis , being transitioned to heparin , in case patient needs surgery.   Goal of Therapy:  Heparin  level 0.3-0.7 units/ml aPTT 66-102 seconds Monitor platelets by anticoagulation protocol: Yes   Plan:  Last dose unknown, will not give initial bolus Start heparin  infusion at 1100 units/hr Will follow aPTT until correlation w/ HL confirmed Will check aPTT & HL in 6 hr after start of infusion HL & CBC daily while on heparin   Rankin CANDIE Dills, PharmD, [facility] 11/04/2024 11:07 PM      [1]  Allergies Allergen Reactions   Oxycodone -Acetaminophen      Emetrol     Other Reaction(s): critical   Hydrocodone-Acetaminophen  Other (See Comments)    Reaction: pt can't take anything with tylenol  due to her liver disease.    Ibuprofen Other (See Comments)    Esophageal varices   Naproxen Other (See Comments)    Esophageal varices   Oseltamivir     Other Reaction(s): critical   Propoxyphene Other (See Comments)    Reaction: pt isn't sure, but knows she can't take it.    Shellfish Allergy Hives   Sodium Ferric Gluconate [Ferrous Gluconate]     IV IRON    Tamiflu  [Oseltamivir Phosphate] Other (See Comments)    Esophageal varices

## 2024-11-04 NOTE — ED Provider Notes (Signed)
°  Physical Exam  BP 106/68   Pulse (!) 45   Temp 97.6 F (36.4 C) (Oral)   Resp 16   Ht 5' 4 (1.626 m)   Wt 88 kg   SpO2 100%   BMI 33.30 kg/m   Physical Exam  Procedures  Procedures  ED Course / MDM    Medical Decision Making Amount and/or Complexity of Data Reviewed Labs: ordered. Radiology: ordered.  Risk Prescription drug management. Decision regarding hospitalization.   This patient was signed out to me pending ultrasound results.  Ultrasound shows cholelithiasis with some gallbladder wall thickening and fluid.  Negative Murphy sign.  I discussed the patient's case with the hospitalist who is in agreement with plan for admission for treatment of her antibiotics.       Zyen Triggs M, MD 11/05/24 [phone]

## 2024-11-04 NOTE — ED Notes (Signed)
 Two unsuccessful attempts at a PIV. IV team consulted.

## 2024-11-04 NOTE — ED Notes (Signed)
 Informed Nat PEAK of assigned bed

## 2024-11-04 NOTE — ED Provider Notes (Signed)
 [facility] Provider Note    Event Date/Time   First MD Initiated Contact with Patient 11/04/24 1147     (approximate)   History   Flank pain  HPI  Kathryn [name] is a 68 y.o. female with a history of cirrhosis of the liver with esophageal varices, CHF, CKD, atrial fibrillation who presents with complaints of left flank pain which apparently started overnight.  She had also complained of chest pain to triage but denies that to me.  No shortness of breath, no pleurisy.  No nausea or vomiting.  No history of kidney stones reported.  No dysuria     Physical Exam   Triage Vital Signs: ED Triage Vitals  Encounter Vitals Group     BP 11/04/24 1140 (!) 168/91     Girls Systolic BP Percentile --      Girls Diastolic BP Percentile --      Boys Systolic BP Percentile --      Boys Diastolic BP Percentile --      Pulse Rate 11/04/24 1140 (!) 55     Resp 11/04/24 1140 18     Temp 11/04/24 1140 97.6 F (36.4 C)     Temp Source 11/04/24 1140 Oral     SpO2 11/04/24 1137 93 %     Weight 11/04/24 1158 88 kg (194 lb)     Height 11/04/24 1158 1.626 m (5' 4)     Head Circumference --      Peak Flow --      Pain Score 11/04/24 1139 10     Pain Loc --      Pain Education --      Exclude from Growth Chart --     Most recent vital signs: Vitals:   11/04/24 1140 11/04/24 1330  BP: (!) 168/91 138/84  Pulse: (!) 55 (!) 51  Resp: 18 17  Temp: 97.6 F (36.4 C)   SpO2: 100% 100%     General: Awake, no distress.  CV:  Good peripheral perfusion.  Resp:  Normal effort.  Abd:  No distention.  Mild left CVA tenderness, abdomen soft, reassuring exam Other:     ED Results / Procedures / Treatments   Labs (all labs ordered are listed, but only abnormal results are displayed) Labs Reviewed  COMPREHENSIVE METABOLIC PANEL WITH GFR - Abnormal; Notable for the following components:      Result Value   CO2 21 (*)    Creatinine, Ser 1.06 (*)    AST 395 (*)    ALT  63 (*)    Alkaline Phosphatase 239 (*)    Total Bilirubin 1.6 (*)    GFR, Estimated 57 (*)    Anion gap 16 (*)    All other components within normal limits  LIPASE, BLOOD - Abnormal; Notable for the following components:   Lipase 637 (*)    All other components within normal limits  URINALYSIS, ROUTINE W REFLEX MICROSCOPIC - Abnormal; Notable for the following components:   Color, Urine YELLOW (*)    APPearance CLOUDY (*)    Specific Gravity, Urine >1.046 (*)    All other components within normal limits  CBC  TROPONIN T, HIGH SENSITIVITY  TROPONIN T, HIGH SENSITIVITY     EKG     RADIOLOGY CT scan viewed interpret by me, suspicious for pancreatitis, enlarged gallbladder    PROCEDURES:  Critical Care performed:   Procedures   MEDICATIONS ORDERED IN ED: Medications  morphine  (PF) 4 MG/ML  injection 4 mg (4 mg Intravenous Given 11/04/24 1336)  ondansetron  (ZOFRAN ) injection 4 mg (4 mg Intravenous Given 11/04/24 1335)  iohexol  (OMNIPAQUE ) 300 MG/ML solution 100 mL (100 mLs Intravenous Contrast Given 11/04/24 1414)     IMPRESSION / MDM / ASSESSMENT AND PLAN / ED COURSE  I reviewed the triage vital signs and the nursing notes. Patient's presentation is most consistent with acute presentation with potential threat to life or bodily function.  Patient presents with primary complaint of left-sided flank pain, differential includes ureterolithiasis, pyelonephritis, pancreatitis, diverticulitis  Will treat with IV morphine , IV Zofran , labs pending  Notably patient has an elevated lipase suspicious for pancreatitis which could certainly cause her flank pain, will send for CT abdomen pelvis  CT for pancreatitis but also enlarged gallbladder will send for ultrasound given elevated LFTs, have asked my colleague to follow-up on ultrasound, anticipate likely admission      FINAL CLINICAL IMPRESSION(S) / ED DIAGNOSES   Final diagnoses:  Acute pancreatitis, unspecified  complication status, unspecified pancreatitis type     Rx / DC Orders   ED Discharge Orders     None        Note:  This document was prepared using Dragon voice recognition software and may include unintentional dictation errors.   Arlander Charleston, MD 11/04/24 [phone]

## 2024-11-04 NOTE — Consult Note (Signed)
 SURGICAL CONSULTATION NOTE   HISTORY OF PRESENT ILLNESS (HPI):  68 y.o. female presented to [facility] ED for evaluation of chest pain. Patient reports she started feeling a stabbing chest pain rating to left chest since last night.  She cannot identify any elevating or aggravating factors.  Pain radiated to the left chest.  She endorses associated nausea and vomiting.  She also endorses having some epigastric pain.  At the ER she was found with stable vital signs.  Physical exam with benign abdomen without significant tenderness.  Labs showed white blood cell count of 2.7 with hemoglobin of 9.1 with platelets of 56.  CMP shows no significant electrolyte disturbance but with elevated AST/ALT, alkaline phosphatase of 239 and total bilirubin of 1.6.  Lipase 637.  Of note patient with history of alcoholic cirrhosis.  Patient still drinking alcohol last alcohol drink last night.  She had a CT scan of the abdomen and pelvis for further evaluation of pancreatitis.  CT scan shows cholelithiasis with similar circumferential wall prominence of the gallbladder compared to prior studies.  There is perihepatic ascites.  I also evaluated the ultrasound done today that showed a nonmobile small echogenic foci seen in the neck of the gallbladder.  I compared this with previous ultrasound, for example ultrasound in June 13, 2016 there is also a nonmobile shadowing echogenic stone in the neck of the gallbladder measuring 7 mm.  Surgery is consulted by Dr. Hilma in this context for evaluation and management of pancreatitis.  PAST MEDICAL HISTORY (PMH):  Past Medical History:  Diagnosis Date   Alcoholic cirrhosis of liver (HCC)    Asthma    CHF (congestive heart failure) (HCC)    Chronic disease anemia    Esophageal varices (HCC)    GR I on EGD by Dr Jeri 09/2014   ETOH abuse    Hypertension    Hypothyroidism    Murmur    Portal hypertension (HCC)    Sarcoidosis      PAST SURGICAL HISTORY (PSH):  Past Surgical  History:  Procedure Laterality Date   COLONOSCOPY N/A 07/29/2024   Procedure: COLONOSCOPY;  Surgeon: Maryruth Ole DASEN, MD;  Location: [facility] ENDOSCOPY;  Service: Endoscopy;  Laterality: N/A;   ESOPHAGOGASTRODUODENOSCOPY N/A 06/26/2021   Procedure: ESOPHAGOGASTRODUODENOSCOPY (EGD);  Surgeon: Toledo, Ladell POUR, MD;  Location: [facility] ENDOSCOPY;  Service: Gastroenterology;  Laterality: N/A;   ESOPHAGOGASTRODUODENOSCOPY N/A 07/29/2024   Procedure: EGD (ESOPHAGOGASTRODUODENOSCOPY);  Surgeon: Maryruth Ole DASEN, MD;  Location: [facility] ENDOSCOPY;  Service: Endoscopy;  Laterality: N/A;   ESOPHAGOGASTRODUODENOSCOPY (EGD) WITH PROPOFOL  N/A 04/04/2015   Procedure: ESOPHAGOGASTRODUODENOSCOPY (EGD) WITH PROPOFOL ;  Surgeon: Rogelia Copping, MD;  Location: [facility] ENDOSCOPY;  Service: Endoscopy;  Laterality: N/A;  Dr COPPING prefers 1445   ESOPHAGOGASTRODUODENOSCOPY (EGD) WITH PROPOFOL  N/A 12/04/2022   Procedure: ESOPHAGOGASTRODUODENOSCOPY (EGD) WITH PROPOFOL ;  Surgeon: Therisa Bi, MD;  Location: [facility] ENDOSCOPY;  Service: Gastroenterology;  Laterality: N/A;   ESOPHAGOGASTRODUODENOSCOPY W/ BANDING  09/2014   Rein   LEFT HEART CATH AND CORONARY ANGIOGRAPHY N/A 02/21/2024   Procedure: LEFT HEART CATH AND CORONARY ANGIOGRAPHY;  Surgeon: Mady Bruckner, MD;  Location: [facility] INVASIVE CV LAB;  Service: Cardiovascular;  Laterality: N/A;   TUBAL LIGATION       MEDICATIONS:  Prior to Admission medications  Medication Sig Start Date End Date Taking? Authorizing Provider  albuterol  (VENTOLIN  HFA) 108 (90 Base) MCG/ACT inhaler Inhale 1-2 puffs into the lungs every 4 (four) hours as needed for wheezing or shortness of breath. 06/14/23  Yes [provider]  apixaban  (ELIQUIS ) 5 MG TABS tablet Take 1 tablet (5 mg total) by mouth 2 (two) times daily. 02/22/24  Yes Lenon Marien CROME, MD  ferrous sulfate  325 (65 FE) MG tablet Take 1 tablet (325 mg total) by mouth daily with breakfast. 08/03/24  Yes Djan, Drue DASEN, MD  folic acid  (FOLVITE )  1 MG tablet Take 1 tablet (1 mg total) by mouth daily. 08/03/24  Yes Dorinda Drue DASEN, MD  levothyroxine  (SYNTHROID ) 112 MCG tablet 1 tablet in the morning on an empty stomach Orally Once a day for 90 days 04/26/24  Yes [provider]  Multiple Vitamin (MULTIVITAMIN) tablet Take 1 tablet by mouth every other day.   Yes [provider]  nadolol  (CORGARD ) 80 MG tablet Take by mouth daily. For 90 days   Yes [provider]  pantoprazole  (PROTONIX ) 40 MG tablet Take 1 tablet (40 mg total) by mouth daily. 08/03/24  Yes Djan, Drue DASEN, MD  senna-docusate (SENOKOT-S) 8.6-50 MG tablet Take 1 tablet by mouth at bedtime as needed for mild constipation. 08/02/24  Yes Dorinda Drue DASEN, MD  atorvastatin  (LIPITOR) 10 MG tablet Take 1 tablet (10 mg total) by mouth daily. Patient not taking: Reported on 07/27/2024 02/23/24   Lenon Marien CROME, MD  Blood Glucose Monitoring Suppl (BLOOD GLUCOSE MONITOR SYSTEM) w/Device KIT as directed as directed daily for 30 days 06/08/24   [provider]  thiamine  (VITAMIN B-1) 100 MG tablet Take 1 tablet (100 mg total) by mouth daily. 02/23/24   Lenon Marien CROME, MD     ALLERGIES:  Allergies[1]   SOCIAL HISTORY:  Social History   Socioeconomic History   Marital status: Divorced    Spouse name: Not on file   Number of children: Not on file   Years of education: Not on file   Highest education level: Not on file  Occupational History   Not on file  Tobacco Use   Smoking status: Never   Smokeless tobacco: Never  Vaping Use   Vaping status: Never Used  Substance and Sexual Activity   Alcohol use: Yes    Alcohol/week: 15.0 standard drinks of alcohol    Types: 15 Shots of liquor per week   Drug use: No   Sexual activity: Not Currently  Other Topics Concern   Not on file  Social History Narrative   She lives with 2 grandsons   Social Drivers of Health   Tobacco Use: Low Risk (11/04/2024)   Patient History    Smoking Tobacco Use:  Never    Smokeless Tobacco Use: Never    Passive Exposure: Not on file  Financial Resource Strain: Not on file  Food Insecurity: No Food Insecurity (07/28/2024)   Epic    Worried About Programme Researcher, Broadcasting/film/video in the Last Year: Never true    Ran Out of Food in the Last Year: Never true  Transportation Needs: Unmet Transportation Needs (07/28/2024)   Epic    Lack of Transportation (Medical): Yes    Lack of Transportation (Non-Medical): No  Physical Activity: Not on file  Stress: Not on file  Social Connections: Socially Isolated (07/28/2024)   Social Connection and Isolation Panel    Frequency of Communication with Friends and Family: More than three times a week    Frequency of Social Gatherings with Friends and Family: Three times a week    Attends Religious Services: Never    Active Member of Clubs or Organizations: No    Attends Club or  Organization Meetings: Never    Marital Status: Widowed  Intimate Partner Violence: Not At Risk (07/28/2024)   Epic    Fear of Current or Ex-Partner: No    Emotionally Abused: No    Physically Abused: No    Sexually Abused: No  Depression (PHQ2-9): Low Risk (12/08/2023)   Depression (PHQ2-9)    PHQ-2 Score: 0  Alcohol Screen: Not on file  Housing: Low Risk (07/28/2024)   Epic    Unable to Pay for Housing in the Last Year: No    Number of Times Moved in the Last Year: 0    Homeless in the Last Year: No  Utilities: Not At Risk (07/28/2024)   Epic    Threatened with loss of utilities: No  Health Literacy: Not on file      FAMILY HISTORY:  Family History  Problem Relation Age of Onset   Hypertension Mother    Heart attack Mother    Aneurysm Father    COPD Father    Cerebral aneurysm Father    Hypertension Sister    Hypertension Brother      REVIEW OF SYSTEMS:  Constitutional: denies weight loss, fever, chills, or sweats  Eyes: denies any other vision changes, history of eye injury  ENT: denies sore throat, hearing problems  Respiratory: denies  shortness of breath, wheezing  Cardiovascular: Positive chest pain Gastrointestinal: Positive abdominal pain, nausea and vomiting Genitourinary: denies burning with urination or urinary frequency Musculoskeletal: denies any other joint pains or cramps  Skin: denies any other rashes or skin discolorations  Neurological: denies any other headache, dizziness, weakness  Psychiatric: denies any other depression, anxiety   All other review of systems were negative   VITAL SIGNS:  Temp:  [97.6 F (36.4 C)-97.8 F (36.6 C)] 97.8 F (36.6 C) (12/13 2050) Pulse Rate:  [41-56] 41 (12/13 2050) Resp:  [15-18] 15 (12/13 2050) BP: (106-168)/(68-98) 122/68 (12/13 2050) SpO2:  [9 %-100 %] 100 % (12/13 2050) Weight:  [82.6 kg-88 kg] 82.6 kg (12/13 2005)     Height: 5' 4 (162.6 cm) Weight: 82.6 kg BMI (Calculated): 31.24   INTAKE/OUTPUT:  This shift: No intake/output data recorded.  Last 2 shifts: @IOLAST2SHIFTS @   PHYSICAL EXAM:  Constitutional:  -- Normal body habitus  -- Awake, alert, and oriented x3  Eyes:  -- Pupils equally round and reactive to light  -- No scleral icterus  Ear, nose, and throat:  -- No jugular venous distension  Pulmonary:  -- No crackles  -- Equal breath sounds bilaterally -- Breathing non-labored at rest Cardiovascular:  -- S1, S2 present  -- No pericardial rubs Gastrointestinal:  -- Abdomen soft, nontender, non-distended, no guarding or rebound tenderness Musculoskeletal and Integumentary:  -- Wounds: None appreciated -- Extremities: B/L UE and LE FROM, hands and feet warm, no edema  Neurologic:  -- Motor function: intact and symmetric -- Sensation: intact and symmetric   Labs:     Latest Ref Rng & Units 11/04/2024    2:26 PM 08/02/2024    5:15 AM 08/01/2024    7:03 PM  CBC  WBC 4.0 - 10.5 K/uL 2.7  4.4    Hemoglobin 12.0 - 15.0 g/dL 9.1  8.1  8.7   Hematocrit 36.0 - 46.0 % 27.7  26.3  29.5   Platelets 150 - 400 K/uL 56  130        Latest Ref Rng  & Units 11/04/2024   12:08 PM 08/01/2024    3:27 AM 07/31/2024  12:17 PM  CMP  Glucose 70 - 99 mg/dL 76  99    BUN 8 - 23 mg/dL 15  10    Creatinine 9.55 - 1.00 mg/dL 8.93  8.68    Sodium 864 - 145 mmol/L 140  138  132   Potassium 3.5 - 5.1 mmol/L 4.8  4.4    Chloride 98 - 111 mmol/L 103  107    CO2 22 - 32 mmol/L 21  24    Calcium  8.9 - 10.3 mg/dL 8.9  8.1    Total Protein 6.5 - 8.1 g/dL 7.7     Total Bilirubin 0.0 - 1.2 mg/dL 1.6     Alkaline Phos 38 - 126 U/L 239     AST 15 - 41 U/L 395     ALT 0 - 44 U/L 63      Assessment/Plan:  68 y.o. 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  Pancreatitis - Most likely due to alcoholic pancreatitis.  Patient with chronic history of recurrent pancreatitis.  She continue drinking alcohol.  Last drink was vodka last night. - Imaging from the gallbladder standpoint has been unchanged for years - Continue conservative management  Alcoholic cirrhosis -Patient Child-Pugh B: Bilirubin 1.6, albumin 3.5, INR 1.5, ascites slight, no encephalopathy for a score of score 7 -  Complicated with varices - Consider medical management for portal hypertension reduction manifested as varices.  Consider beta-blocker or diuretics.  With her imaging from gallbladder standpoint benign change in patient continue drinking alcohol, recurrent tachycardia most likely due to alcoholic etiology.  At this point I do not recommend cholecystectomy.  Patient is child-pugh B, which made her at least 30% mortality from surgical intervention.  Patient continue optimization from the cirrhosis standpoint, especially alcohol abstinence.  Lucas Petrin, MD      [1]  Allergies Allergen Reactions   Oxycodone -Acetaminophen     Emetrol     Other Reaction(s): critical   Hydrocodone-Acetaminophen  Other (See Comments)    Reaction: pt can't take  anything with tylenol  due to her liver disease.    Ibuprofen Other (See Comments)    Esophageal varices   Naproxen Other (See Comments)    Esophageal varices   Oseltamivir     Other Reaction(s): critical   Propoxyphene Other (See Comments)    Reaction: pt isn't sure, but knows she can't take it.    Shellfish Allergy Hives   Sodium Ferric Gluconate [Ferrous Gluconate]     IV IRON    Tamiflu  [Oseltamivir Phosphate] Other (See Comments)    Esophageal varices

## 2024-11-05 LAB — COMPREHENSIVE METABOLIC PANEL WITH GFR
ALT: 52 U/L — ABNORMAL HIGH (ref 0–44)
AST: 269 U/L — ABNORMAL HIGH (ref 15–41)
Albumin: 3.5 g/dL (ref 3.5–5.0)
Alkaline Phosphatase: 208 U/L — ABNORMAL HIGH (ref 38–126)
Anion gap: 16 — ABNORMAL HIGH (ref 5–15)
BUN: 17 mg/dL (ref 8–23)
CO2: 23 mmol/L (ref 22–32)
Calcium: 8.4 mg/dL — ABNORMAL LOW (ref 8.9–10.3)
Chloride: 104 mmol/L (ref 98–111)
Creatinine, Ser: 1.17 mg/dL — ABNORMAL HIGH (ref 0.44–1.00)
GFR, Estimated: 51 mL/min — ABNORMAL LOW (ref >60)
Glucose, Bld: 74 mg/dL (ref 70–99)
Potassium: 4.4 mmol/L (ref 3.5–5.1)
Sodium: 143 mmol/L (ref 135–145)
Total Bilirubin: 2.5 mg/dL — ABNORMAL HIGH (ref 0.0–1.2)
Total Protein: 7.2 g/dL (ref 6.5–8.1)

## 2024-11-05 LAB — CBC
HCT: 29.2 % — ABNORMAL LOW (ref 36.0–46.0)
Hemoglobin: 9.4 g/dL — ABNORMAL LOW (ref 12.0–15.0)
MCH: 30.6 pg (ref 26.0–34.0)
MCHC: 32.2 g/dL (ref 30.0–36.0)
MCV: 95.1 fL (ref 80.0–100.0)
Platelets: 55 K/uL — ABNORMAL LOW (ref 150–400)
RBC: 3.07 MIL/uL — ABNORMAL LOW (ref 3.87–5.11)
RDW: 23.3 % — ABNORMAL HIGH (ref 11.5–15.5)
WBC: 4.4 K/uL (ref 4.0–10.5)
nRBC: 1.8 % — ABNORMAL HIGH (ref 0.0–0.2)

## 2024-11-05 LAB — RESP PANEL BY RT-PCR (RSV, FLU A&B, COVID)  RVPGX2
Influenza A by PCR: NEGATIVE
Influenza B by PCR: NEGATIVE
Resp Syncytial Virus by PCR: NEGATIVE
SARS Coronavirus 2 by RT PCR: NEGATIVE

## 2024-11-05 LAB — APTT: aPTT: >200 s (ref 24–36)

## 2024-11-05 LAB — GLUCOSE, CAPILLARY: Glucose-Capillary: 80 mg/dL (ref 70–99)

## 2024-11-05 LAB — HEPARIN LEVEL (UNFRACTIONATED): Heparin Unfractionated: 0.64 [IU]/mL (ref 0.30–0.70)

## 2024-11-05 MED ORDER — SODIUM CHLORIDE 0.9 % IV SOLN
12.5000 mg | Freq: Once | INTRAVENOUS | Status: AC
  Administered 2024-11-05: 12.5 mg via INTRAVENOUS
  Filled 2024-11-05: qty 0.5

## 2024-11-05 MED ORDER — HEPARIN (PORCINE) 25000 UT/250ML-% IV SOLN: 900.0000 [IU]/h | INTRAVENOUS | Status: DC

## 2024-11-05 MED ORDER — APIXABAN 5 MG PO TABS
5.0000 mg | ORAL_TABLET | Freq: Two times a day (BID) | ORAL | Status: DC
  Administered 2024-11-05 – 2024-11-07 (×5): 5 mg via ORAL
  Filled 2024-11-05 (×5): qty 1

## 2024-11-05 NOTE — Progress Notes (Signed)
 ANTICOAGULATION CONSULT NOTE  Pharmacy Consult for heparin  infusion Indication: Hx of A fib and portal vein thrombosis on Eliquis , now has gallstone, in case patient needs surgery.   Allergies[1]  Patient Measurements: Height: 5' 4 (162.6 cm) Weight: 81.6 kg (179 lb 14.3 oz) IBW/kg (Calculated) : 54.7 HEPARIN  DW (KG): 72.6  Labs: Recent Labs    11/04/24 1208 11/04/24 1426 11/04/24 1940 11/05/24 0427 11/05/24 0745  HGB  --  9.1*  --  9.4*  --   HCT  --  27.7*  --  29.2*  --   PLT  --  56*  --  55*  --   APTT  --   --  39*  --   --   LABPROT  --   --  18.4*  --   --   INR  --   --  1.5*  --   --   HEPARINUNFRC  --   --   --   --  0.64  CREATININE 1.06*  --   --   --  1.17*    Estimated Creatinine Clearance: 47.6 mL/min (A) (by C-G formula based on SCr of 1.17 mg/dL (H)).   Medical History: Past Medical History:  Diagnosis Date   Alcoholic cirrhosis of liver (HCC)    Asthma    CHF (congestive heart failure) (HCC)    Chronic disease anemia    Esophageal varices (HCC)    GR I on EGD by Dr Jeri 09/2014   ETOH abuse    Hypertension    Hypothyroidism    Murmur    Portal hypertension (HCC)    Sarcoidosis     Medications:  PTA Meds: Eliquis , last dose reported as 11/03/24 but no recent fills in dispense report  Assessment: Pt is a 68 yo female presenting to ED c/o lower abdominal pain found with acute cholecystitis.  Pt with hx of A fib and portal vein thrombosis on Eliquis , being transitioned to heparin , in case patient needs surgery.   Baseline aPTT 39s and INR 1.5. No baseline heparin  level was collected. Of note, patient's platelets at baseline were 56k.  1214 0745 HL 0.64, aPTT > 200, 1100 un/hr  Goal of Therapy:  Heparin  level 0.3-0.7 units/ml aPTT 66-102 seconds Monitor platelets by anticoagulation protocol: Yes   Plan:  --Heparin  level is therapeutic. aPTT is profoundly elevated --Will hold infusion x 1 hour and then re-start at 900 units/hr. The  indication for heparin  is history of portal vein thrombosis and given thrombocytopenia will err on side of caution --Re-check heparin  level 6 hours from re-start. Will not follow aPTT as does not appear patient is compliant with Eliquis  --Daily CBC per protocol while on IV heparin   Kathryn [name] 11/05/2024 10:24 AM     [1]  Allergies Allergen Reactions   Oxycodone -Acetaminophen     Emetrol     Other Reaction(s): critical   Hydrocodone-Acetaminophen  Other (See Comments)    Reaction: pt can't take anything with tylenol  due to her liver disease.    Ibuprofen Other (See Comments)    Esophageal varices   Naproxen Other (See Comments)    Esophageal varices   Oseltamivir     Other Reaction(s): critical   Propoxyphene Other (See Comments)    Reaction: pt isn't sure, but knows she can't take it.    Shellfish Allergy Hives   Sodium Ferric Gluconate [Ferrous Gluconate]     IV IRON    Tamiflu  [Oseltamivir Phosphate] Other (See Comments)    Esophageal varices

## 2024-11-05 NOTE — Progress Notes (Signed)
 ANTICOAGULATION CONSULT NOTE  Pharmacy Consult for heparin  infusion Indication: Hx of A fib and portal vein thrombosis on Eliquis , now has gallstone, in case patient needs surgery.   Allergies[1]  Patient Measurements: Height: 5' 4 (162.6 cm) Weight: 81.6 kg (179 lb 14.3 oz) IBW/kg (Calculated) : 54.7 HEPARIN  DW (KG): 72.6  Labs: Recent Labs    11/04/24 1208 11/04/24 1426 11/04/24 1940 11/05/24 0427 11/05/24 0745  HGB  --  9.1*  --  9.4*  --   HCT  --  27.7*  --  29.2*  --   PLT  --  56*  --  55*  --   APTT  --   --  39*  --  >200*  LABPROT  --   --  18.4*  --   --   INR  --   --  1.5*  --   --   HEPARINUNFRC  --   --   --   --  0.64  CREATININE 1.06*  --   --   --  1.17*    Estimated Creatinine Clearance: 47.6 mL/min (A) (by C-G formula based on SCr of 1.17 mg/dL (H)).   Medical History: Past Medical History:  Diagnosis Date   Alcoholic cirrhosis of liver (HCC)    Asthma    CHF (congestive heart failure) (HCC)    Chronic disease anemia    Esophageal varices (HCC)    GR I on EGD by Dr Jeri 09/2014   ETOH abuse    Hypertension    Hypothyroidism    Murmur    Portal hypertension (HCC)    Sarcoidosis     Medications:  PTA Meds: Eliquis , last dose reported as 11/03/24 but no recent fills in dispense report  Assessment: Pt is a 68 yo female presenting to ED c/o lower abdominal pain found with acute cholecystitis.  Pt with hx of A fib and portal vein thrombosis on Eliquis , being transitioned to heparin , in case patient needs surgery.   Baseline aPTT 39s and INR 1.5. No baseline heparin  level was collected. Of note, patient's platelets at baseline were 56k.  1214 0745 HL 0.64, aPTT > 200, 1100 un/hr  Goal of Therapy:  Heparin  level 0.3-0.7 units/ml aPTT 66-102 seconds Monitor platelets by anticoagulation protocol: Yes   Plan:  -- Stopping Heparin  and restarting patient's home Eliquis  5 mg BID per MD -- Unclear if patient was taking Eliquis  at home for afib  and hx of portal vein thrombosis.    Ransom Blanch PGY-1 Pharmacy Resident  [facility]  11/05/2024 11:40 AM      [1]  Allergies Allergen Reactions   Oxycodone -Acetaminophen     Emetrol     Other Reaction(s): critical   Hydrocodone-Acetaminophen  Other (See Comments)    Reaction: pt can't take anything with tylenol  due to her liver disease.    Ibuprofen Other (See Comments)    Esophageal varices   Naproxen Other (See Comments)    Esophageal varices   Oseltamivir     Other Reaction(s): critical   Propoxyphene Other (See Comments)    Reaction: pt isn't sure, but knows she can't take it.    Shellfish Allergy Hives   Sodium Ferric Gluconate [Ferrous Gluconate]     IV IRON    Tamiflu  [Oseltamivir Phosphate] Other (See Comments)    Esophageal varices

## 2024-11-05 NOTE — Progress Notes (Signed)
 PROGRESS NOTE    Kathryn [name]  FMW:[id number] DOB: 08-14-1956 DOA: 11/04/2024 PCP: Odell Tor Edra CINDERELLA, MD   Assessment & Plan:   Principal Problem:   Acute gallstone pancreatitis Active Problems:   Alcoholic pancreatitis   Alcohol use disorder   Alcoholic cirrhosis of liver (HCC)   Pancytopenia (HCC)   Portal vein thrombosis   Hypertension   Atrial fibrillation, chronic (HCC)   Chronic diastolic CHF (congestive heart failure) (HCC)   Hypothyroidism   CKD (chronic kidney disease), stage IIIa   Hyperlipemia   Pancreatic cyst   Asthma   COPD (chronic obstructive pulmonary disease) (HCC)   Myocardial injury  Assessment and Plan: Likely alcoholic pancreatitis: Lipase 362. CT scan and US -RUQ showed gallstone.  No common duct dilation by ultrasound-RUQ. Unable to do MRCP secondary to loop recorder in place. Continue on IVFs. Started CLD today   Alcohol use disorder: continue on CIWA protocol. Received alcohol cessation counseling already    Alcoholic cirrhosis of liver: continue on home lactulose . 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 No need for a transfusion currently    Portal vein thrombosis: d/c IV heparin  drip and restart home dose of eliquis    HTN: is WNL currently. Holding nadolol  secondary to bradycardia    Chronic a.fib: rate controlled currently. Holding naldol secondary to bradycardia. D/c IV heparin  drip and restart home dose of eliquis    Chronic diastolic CHF: echo on 02/20/2024 showed EF of 65-70%.  Patient has trace leg edema, no JVD. Appears compensated. Monitor I/Os   Hypothyroidism: continue on home dose of levothyroxine     CKDIIIa: Cr is labile. Avoid nephrotoxic meds    HLD: not on a statin    Pancreatic cyst: pt has Loop recorder, cannot do MRI. F/u with PCP as outpt to repeat CT scan   Asthma: unknown stage and/or severity. Continue on  bronchodilators and encourage incentive spirometry   COPD: w/o exacerbation. Continue on bronchodilators & encourage incentive spirometry. CXR shows no acute cardiopulmonary process  Myocardial injury: Troponin 19 - > 21, likely due to demand ischemia.  No chest pain.   Abnormal findings of CT scan: CT abdomen/pelvis showed heterogeneous expansion of the endometrial canal spanning approximately 12 mm. Recommend further evaluation with pelvic ultrasound when clinically appropriate. Will need to f/u outpatient w/ obgyn for further evaluation and possible treatment         DVT prophylaxis: eliquis  Code Status: full  Family Communication:  Disposition Plan: depends on PT/OT consulted   Level of care: Telemetry  Status is: Inpatient Remains inpatient appropriate because: severity of illness    Consultants:    Procedures:   Antimicrobials:    Subjective: Pt c/o intermittent nausea.   Objective: Vitals:   11/04/24 2050 11/05/24 0025 11/05/24 0509 11/05/24 0757  BP: 122/68  118/66 (!) 124/56  Pulse: (!) 41 (!) 47 (!) 54 (!) 58  Resp: 15  16 18   Temp: 97.8 F (36.6 C)  97.8 F (36.6 C) 98.1 F (36.7 C)  TempSrc:      SpO2: 100%  100% 95%  Weight:   81.6 kg   Height:       No intake or output data in the 24 hours ending 11/05/24 0951 Filed Weights   11/04/24 1158 11/04/24 2005 11/05/24 0509  Weight: 88 kg 82.6 kg 81.6 kg    Examination:  General exam: Appears calm and comfortable  Respiratory system:  Clear to auscultation. Respiratory effort normal. Cardiovascular system: S1 & S2 +. No rubs, gallops or clicks. Gastrointestinal system: Abdomen is nondistended, soft and nontender. Normal bowel sounds heard. Central nervous system: Alert and awake. Moves all extremities Psychiatry: Judgement and insight appears at baseline. Flat mood and affect   Data Reviewed: I have personally reviewed following labs and imaging studies  CBC: Recent Labs  Lab  11/04/24 1426 11/05/24 0427  WBC 2.7* 4.4  HGB 9.1* 9.4*  HCT 27.7* 29.2*  MCV 93.3 95.1  PLT 56* 55*   Basic Metabolic Panel: Recent Labs  Lab 11/04/24 1208 11/04/24 1940 11/05/24 0745  NA 140  --  143  K 4.8  --  4.4  CL 103  --  104  CO2 21*  --  23  GLUCOSE 76  --  74  BUN 15  --  17  CREATININE 1.06*  --  1.17*  CALCIUM  8.9  --  8.4*  MG  --  2.0  --   PHOS  --  3.7  --    GFR: Estimated Creatinine Clearance: 47.6 mL/min (A) (by C-G formula based on SCr of 1.17 mg/dL (H)). Liver Function Tests: Recent Labs  Lab 11/04/24 1208 11/05/24 0745  AST 395* 269*  ALT 63* 52*  ALKPHOS 239* 208*  BILITOT 1.6* 2.5*  PROT 7.7 7.2  ALBUMIN 3.5 3.5   Recent Labs  Lab 11/04/24 1208 11/04/24 1940  LIPASE 637* 434*   Recent Labs  Lab 11/04/24 1940  AMMONIA 43*   Coagulation Profile: Recent Labs  Lab 11/04/24 1940  INR 1.5*   Cardiac Enzymes: No results for input(s): CKTOTAL, CKMB, CKMBINDEX, TROPONINI in the last 168 hours. BNP (last 3 results) Recent Labs    11/04/24 1940  PROBNP 279.0   HbA1C: No results for input(s): HGBA1C in the last 72 hours. CBG: Recent Labs  Lab 11/05/24 0759  GLUCAP 80   Lipid Profile: Recent Labs    11/04/24 1940  TRIG 90   Thyroid Function Tests: No results for input(s): TSH, T4TOTAL, FREET4, T3FREE, THYROIDAB in the last 72 hours. Anemia Panel: No results for input(s): VITAMINB12, FOLATE, FERRITIN, TIBC, IRON , RETICCTPCT in the last 72 hours. Sepsis Labs: No results for input(s): PROCALCITON, LATICACIDVEN in the last 168 hours.  Recent Results (from the past 240 hours)  Resp panel by RT-PCR (RSV, Flu A&B, Covid) Anterior Nasal Swab     Status: None   Collection Time: 11/04/24 11:06 PM   Specimen: Anterior Nasal Swab  Result Value Ref Range Status   SARS Coronavirus 2 by RT PCR NEGATIVE NEGATIVE Final    Comment: (NOTE) SARS-CoV-2 target nucleic acids are NOT  DETECTED.  The SARS-CoV-2 RNA is generally detectable in upper respiratory specimens during the acute phase of infection. The lowest concentration of SARS-CoV-2 viral copies this assay can detect is 138 copies/mL. A negative result does not preclude SARS-Cov-2 infection and should not be used as the sole basis for treatment or other patient management decisions. A negative result may occur with  improper specimen collection/handling, submission of specimen other than nasopharyngeal swab, presence of viral mutation(s) within the areas targeted by this assay, and inadequate number of viral copies(<138 copies/mL). A negative result must be combined with clinical observations, patient history, and epidemiological information. The expected result is Negative.  Fact Sheet for Patients:  bloggercourse.com  Fact Sheet for Healthcare Providers:  seriousbroker.it  This test is no t yet approved or cleared by the [place]  FDA and  has been authorized for detection and/or diagnosis of SARS-CoV-2 by FDA under an Emergency Use Authorization (EUA). This EUA will remain  in effect (meaning this test can be used) for the duration of the COVID-19 declaration under Section 564(b)(1) of the Act, 21 U.S.C.section 360bbb-3(b)(1), unless the authorization is terminated  or revoked sooner.       Influenza A by PCR NEGATIVE NEGATIVE Final   Influenza B by PCR NEGATIVE NEGATIVE Final    Comment: (NOTE) The Xpert Xpress SARS-CoV-2/FLU/RSV plus assay is intended as an aid in the diagnosis of influenza from Nasopharyngeal swab specimens and should not be used as a sole basis for treatment. Nasal washings and aspirates are unacceptable for Xpert Xpress SARS-CoV-2/FLU/RSV testing.  Fact Sheet for Patients: bloggercourse.com  Fact Sheet for Healthcare Providers: seriousbroker.it  This test is not yet  approved or cleared by the [place]  FDA and has been authorized for detection and/or diagnosis of SARS-CoV-2 by FDA under an Emergency Use Authorization (EUA). This EUA will remain in effect (meaning this test can be used) for the duration of the COVID-19 declaration under Section 564(b)(1) of the Act, 21 U.S.C. section 360bbb-3(b)(1), unless the authorization is terminated or revoked.     Resp Syncytial Virus by PCR NEGATIVE NEGATIVE Final    Comment: (NOTE) Fact Sheet for Patients: bloggercourse.com  Fact Sheet for Healthcare Providers: seriousbroker.it  This test is not yet approved or cleared by the [place]  FDA and has been authorized for detection and/or diagnosis of SARS-CoV-2 by FDA under an Emergency Use Authorization (EUA). This EUA will remain in effect (meaning this test can be used) for the duration of the COVID-19 declaration under Section 564(b)(1) of the Act, 21 U.S.C. section 360bbb-3(b)(1), unless the authorization is terminated or revoked.  Performed at [facility], [address], [place], [place] [address]          Radiology Studies: DG Chest [place] 1 View Result Date: 11/04/2024 EXAM: 1 VIEW(S) XRAY OF THE CHEST 11/04/2024 11:34:30 PM COMPARISON: 07/27/2024 CLINICAL HISTORY: SOB (shortness of breath) FINDINGS: LUNGS AND PLEURA: No focal pulmonary opacity. No pleural effusion. No pneumothorax. HEART AND MEDIASTINUM: Left chest cardiac loop recorder or ICD (implantable cardioverter-defibrillator). No acute abnormality of the cardiac and mediastinal silhouettes. BONES AND SOFT TISSUES: No acute osseous abnormality. IMPRESSION: 1. No acute cardiopulmonary process. Electronically signed by: Oneil Devonshire MD 11/04/2024 11:38 PM EST RP Workstation: [id number]   US  Abdomen Limited RUQ (LIVER/GB) Result Date: 11/04/2024 EXAM: Right Upper Quadrant Abdominal Ultrasound 11/04/2024 04:15:13 PM  TECHNIQUE: Real-time ultrasonography of the right upper quadrant of the abdomen was performed. COMPARISON: CT abdomen and pelvis 11/04/2024. CLINICAL HISTORY: Upper abdominal pain. FINDINGS: LIVER: Liver echotexture is increased and coarsened with nodular liver contour. There is limited evaluation of the liver secondary to increased echogenicity. Doppler flow is identified in the portal vein, but color flow is not seen most likely due to limitations of the study. No intrahepatic biliary ductal dilatation. No evidence of mass. BILIARY SYSTEM: No nonmobile small echogenic foci are seen in the neck of the gallbladder, likely gallstones. Gallbladder wall is borderline thickened measuring 3.6 mm. Pericholecystic fluid is present. Common bile duct measures 5.7 mm, which is within normal limits. RIGHT KIDNEY: The right kidney is grossly unremarkable in appearances without evidence of hydronephrosis, echogenic calculi or worrisome mass lesions. PANCREAS: Visualized portions of the pancreas are unremarkable. OTHER: There is a small amount of free fluid in the right upper quadrant. IMPRESSION: 1. Gallstones in the neck  of the gallbladder with borderline wall thickening and cholecystic fluid. Negative sonographic murphy sign. Correlate clinically for acute cholecystitis. 2. Nodular liver contour with increased, coarsened echotexture, compatible with cirrhosis; evaluation limited by increased echogenicity. 3. Small amount of free fluid in the right upper quadrant. Electronically signed by: Greig Pique MD 11/04/2024 04:29 PM EST RP Workstation: [id number]   CT ABDOMEN PELVIS W CONTRAST Result Date: 11/04/2024 CLINICAL DATA:  Pancreatitis, acute, severe EXAM: CT ABDOMEN AND PELVIS WITH CONTRAST TECHNIQUE: Multidetector CT imaging of the abdomen and pelvis was performed using the standard protocol following bolus administration of intravenous contrast. RADIATION DOSE REDUCTION: This exam was performed according to the  departmental dose-optimization program which includes automated exposure control, adjustment of the mA and/or kV according to patient size and/or use of iterative reconstruction technique. CONTRAST:  OMNIPAQUE  IOHEXOL  300 MG/ML  SOLN COMPARISON:  July 27, 2024, July 27, 2023 1 Apr 14, 2020 FINDINGS: Lower chest: No acute abnormality. Hepatobiliary: Profound hepatic steatosis. Nodular contours of the liver. Cholelithiasis with similar circumferential wall prominence of the gallbladder compared to priors. Perihepatic ascites with overlying varicosities along the RIGHT hepatic margin. Similar appearance of a likely hepatic lipoma in the LEFT liver along the LEFT portal vein. Main portal vein is patent. Similar appearance of coarse calcifications along the lateral aspect of the portal vein; common bile duct is not well visualized. Pancreas: There are several small hypoenhancing areas along the pancreatic body and tail measure approximately 5 mm (series 5, image 40, 41, 37). Nonspecific fat stranding throughout the epigastric area. Spleen: Spleen measures the upper limits of normal at 12 cm. Perisplenic collaterals. Adrenals/Urinary Tract: Adrenal glands are unremarkable. Markedly lobulated appearance of bilateral kidneys, similar compared to priors. RIGHT renal cyst measuring 39 mm (for which no dedicated imaging follow-up is recommended). Subcentimeter hypodense lesions are too small to accurately characterize. No hydronephrosis or obstructing nephrolithiasis. Bladder is decompressed and unremarkable. Stomach/Bowel: No evidence of bowel obstruction. Circumferential wall thickening throughout the colon. This is most pronounced in the RIGHT hemicolon and likely reflects a degree of underlying portal colopathy. Appendix is normal. Diffuse rugal thickening of the stomach with a prominent pylorus, similar compared to priors. Wall thickening of the duodenum. Small hiatal hernia with perigastric varices.  Vascular/Lymphatic: Severe atherosclerotic calcifications of the nonaneurysmal abdominal aorta. Reproductive: There is heterogeneous expansion of the endometrial canal spanning approximately 12 mm. Fluid within the endometrial canal superiorly. Multiple calcified and hypoenhancing fibroids. Hypoenhancing exophytic mass along the RIGHT superior fundus, likely a fibroid. Other: Small volume ascites. Diffuse fat stranding throughout the mesentery. Musculoskeletal: Likely hemangioma of L5. Subcortical cystic versus erosive change along bilateral sacroiliac joints, similar compared to priors. IMPRESSION: 1. Profound hepatic steatosis with nodular contours of the liver, suggestive of cirrhosis. There is sequela of portal hypertension including small volume ascites, multiple abdominal collaterals, and favored portal colopathy. 2. Cholelithiasis with similar circumferential wall prominence of the gallbladder compared to priors. This is favored to reflect sequela of underlying liver disease. If there is clinical concern for acute cholecystitis, recommend dedicated RIGHT upper quadrant ultrasound. 3. Nonspecific fat stranding throughout the epigastric area. This could reflect sequela of portal hypertension, pancreatitis or sequela of duodenitis. Recommend correlation with lipase levels. 4. There is wall thickening of the stomach, duodenum and colon. This is nonspecific and could reflect a nonspecific enteritis or sequela of liver disease. 5. There are several small hypoenhancing areas along the pancreatic body and tail measuring approximately 5 mm. These are nonspecific and could reflect  small pseudocysts or side branch IPMNs. Recommend further evaluation with nonemergent outpatient pancreatic protocol MRI with and without contrast. 6. There is heterogeneous expansion of the endometrial canal spanning approximately 12 mm. Recommend further evaluation with pelvic ultrasound when clinically appropriate. Aortic Atherosclerosis  (ICD10-I70.0). Electronically Signed   By: Corean Salter M.D.   On: 11/04/2024 14:55        Scheduled Meds:  ferrous sulfate   325 mg Oral Q breakfast   folic acid   1 mg Oral Daily   lactulose   20 g Oral BID   levothyroxine   112 mcg Oral Q0600   lidocaine   1 patch Transdermal Q24H   LORazepam   0-4 mg Intravenous Q6H   Followed by   NOREEN ON 11/06/2024] LORazepam   0-4 mg Intravenous Q12H   multivitamin with minerals  1 tablet Oral Daily   pantoprazole   40 mg Oral Daily   thiamine   100 mg Oral Daily   Or   thiamine   100 mg Intravenous Daily   Continuous Infusions:  sodium chloride  75 mL/hr at 11/05/24 0921   heparin  1,100 Units/hr (11/05/24 0053)     LOS: 1 day       Anthony CHRISTELLA Pouch, MD [facility] Pager [phone]  If 7PM-7AM, please contact night-coverage [email or web address] 11/05/2024, 9:51 AM

## 2024-11-05 NOTE — Plan of Care (Signed)
  Problem: Clinical Measurements: Goal: Ability to maintain clinical measurements within normal limits will improve Outcome: Progressing   Problem: Safety: Goal: Ability to remain free from injury will improve Outcome: Progressing   Problem: Activity: Goal: Risk for activity intolerance will decrease Outcome: Progressing

## 2024-11-05 NOTE — Progress Notes (Signed)
 Patient ID: Kathryn [name], female   DOB: September 12, 1956, 68 y.o.   MRN: [id number]     SURGICAL PROGRESS NOTE   Hospital Day(s): 1.   Interval History: Patient seen and examined, no acute events or new complaints overnight. Patient reports feeling better today.  She endorses that her abdominal pain resolved.  She does not have abdominal pain at the moment of my evaluation.  Also as per chart review nurse assessment 7:41 AM there was no pain.   Vital signs in last 24 hours: [min-max] current  Temp:  [97.6 F (36.4 C)-98.1 F (36.7 C)] 98.1 F (36.7 C) (12/14 0757) Pulse Rate:  [41-58] 58 (12/14 0757) Resp:  [15-18] 18 (12/14 0757) BP: (106-168)/(56-98) 124/56 (12/14 0757) SpO2:  [9 %-100 %] 95 % (12/14 0757) Weight:  [81.6 kg-88 kg] 81.6 kg (12/14 0509)     Height: 5' 4 (162.6 cm) Weight: 81.6 kg BMI (Calculated): 30.86   Physical Exam:  Constitutional: alert, cooperative and no distress  Respiratory: breathing non-labored at rest  Cardiovascular: regular rate and sinus rhythm  Gastrointestinal: soft, non-tender, and non-distended  Labs:     Latest Ref Rng & Units 11/05/2024    4:27 AM 11/04/2024    2:26 PM 08/02/2024    5:15 AM  CBC  WBC 4.0 - 10.5 K/uL 4.4  2.7  4.4   Hemoglobin 12.0 - 15.0 g/dL 9.4  9.1  8.1   Hematocrit 36.0 - 46.0 % 29.2  27.7  26.3   Platelets 150 - 400 K/uL 55  56  130       Latest Ref Rng & Units 11/05/2024    7:45 AM 11/04/2024   12:08 PM 08/01/2024    3:27 AM  CMP  Glucose 70 - 99 mg/dL 74  76  99   BUN 8 - 23 mg/dL 17  15  10    Creatinine 0.44 - 1.00 mg/dL 8.82  8.93  8.68   Sodium 135 - 145 mmol/L 143  140  138   Potassium 3.5 - 5.1 mmol/L 4.4  4.8  4.4   Chloride 98 - 111 mmol/L 104  103  107   CO2 22 - 32 mmol/L 23  21  24    Calcium  8.9 - 10.3 mg/dL 8.4  8.9  8.1   Total Protein 6.5 - 8.1 g/dL 7.2  7.7    Total Bilirubin 0.0 - 1.2 mg/dL 2.5  1.6    Alkaline Phos 38 - 126 U/L 208  239    AST 15 - 41 U/L 269  395    ALT 0 - 44 U/L 52  63       Imaging studies: No new pertinent imaging studies   Assessment/Plan:  68 y.o. 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   Pancreatitis - Most likely due to alcoholic pancreatitis.  Patient with chronic history of recurrent pancreatitis.  She continue drinking alcohol.  Last drink was vodka last night. - Imaging from the gallbladder standpoint has been unchanged for years - Continue conservative management   Alcoholic cirrhosis -Patient Child-Pugh B: Bilirubin 2.5, albumin 3.5, INR 1.5, ascites slight, no encephalopathy for a score of score 8 -  Complicated with varices - Consider medical management for portal hypertension reduction manifested as varices.  Consider beta-blocker or diuretics. -Even though bilirubin is trending up, AST/ALT, alkaline phosphatase and lipase are trending down -Continue medical management   With her imaging  from gallbladder standpoint benign change in patient continue drinking alcohol, recurrent tachycardia most likely due to alcoholic etiology.  At this point I do not recommend cholecystectomy.  Patient is child-pugh B, which made her at least 30% mortality from surgical intervention.   Patient continue optimization from the cirrhosis standpoint, especially alcohol abstinence  Lucas Petrin, MD

## 2024-11-05 NOTE — Plan of Care (Signed)
  Problem: Clinical Measurements: Goal: Ability to maintain clinical measurements within normal limits will improve Outcome: Progressing   Problem: Activity: Goal: Risk for activity intolerance will decrease Outcome: Progressing   Problem: Pain Managment: Goal: General experience of comfort will improve and/or be controlled Outcome: Progressing   Problem: Safety: Goal: Ability to remain free from injury will improve Outcome: Progressing

## 2024-11-06 LAB — CBC
HCT: 23.9 % — ABNORMAL LOW (ref 36.0–46.0)
Hemoglobin: 7.9 g/dL — ABNORMAL LOW (ref 12.0–15.0)
MCH: 31 pg (ref 26.0–34.0)
MCHC: 33.1 g/dL (ref 30.0–36.0)
MCV: 93.7 fL (ref 80.0–100.0)
Platelets: 44 K/uL — ABNORMAL LOW (ref 150–400)
RBC: 2.55 MIL/uL — ABNORMAL LOW (ref 3.87–5.11)
RDW: 23.4 % — ABNORMAL HIGH (ref 11.5–15.5)
WBC: 3.1 K/uL — ABNORMAL LOW (ref 4.0–10.5)
nRBC: 4.8 % — ABNORMAL HIGH (ref 0.0–0.2)

## 2024-11-06 LAB — COMPREHENSIVE METABOLIC PANEL WITH GFR
ALT: 42 U/L (ref 0–44)
AST: 211 U/L — ABNORMAL HIGH (ref 15–41)
Albumin: 3 g/dL — ABNORMAL LOW (ref 3.5–5.0)
Alkaline Phosphatase: 176 U/L — ABNORMAL HIGH (ref 38–126)
Anion gap: 11 (ref 5–15)
BUN: 21 mg/dL (ref 8–23)
CO2: 25 mmol/L (ref 22–32)
Calcium: 7.8 mg/dL — ABNORMAL LOW (ref 8.9–10.3)
Chloride: 104 mmol/L (ref 98–111)
Creatinine, Ser: 1.19 mg/dL — ABNORMAL HIGH (ref 0.44–1.00)
GFR, Estimated: 50 mL/min — ABNORMAL LOW (ref >60)
Glucose, Bld: 92 mg/dL (ref 70–99)
Potassium: 4.3 mmol/L (ref 3.5–5.1)
Sodium: 141 mmol/L (ref 135–145)
Total Bilirubin: 2.2 mg/dL — ABNORMAL HIGH (ref 0.0–1.2)
Total Protein: 6.4 g/dL — ABNORMAL LOW (ref 6.5–8.1)

## 2024-11-06 LAB — GLUCOSE, CAPILLARY: Glucose-Capillary: 93 mg/dL (ref 70–99)

## 2024-11-06 MED ORDER — PROCHLORPERAZINE EDISYLATE 10 MG/2ML IJ SOLN
10.0000 mg | Freq: Four times a day (QID) | INTRAMUSCULAR | Status: DC | PRN
  Administered 2024-11-06 – 2024-11-07 (×2): 10 mg via INTRAVENOUS
  Filled 2024-11-06 (×2): qty 2

## 2024-11-06 MED ORDER — SCOPOLAMINE 1 MG/3DAYS TD PT72
1.0000 | MEDICATED_PATCH | TRANSDERMAL | Status: DC
  Administered 2024-11-06 – 2024-11-09 (×2): 1 mg via TRANSDERMAL
  Filled 2024-11-06 (×2): qty 1

## 2024-11-06 NOTE — TOC Initial Note (Signed)
 Transition of Care [facility]) - Initial/Assessment Note    Patient Details  Name: Kathryn [name] MRN: [id number] Date of Birth: [birth year]  Transition of Care [facility]) CM/SW Contact:    Corean ONEIDA Haddock, RN Phone Number: 11/06/2024, 1:46 PM  Clinical Narrative:                  Admitted qnm:jornynopr pancreatitis  Admitted from: Home alone, states that her grandson stays with her sometime PCP: Odell Chard, states uber takes her to appointments  Current home health/prior home health/DME: RW, rollator, can  Therapy recommending HH, BSC, and tub Bench.  Patient states she doesn't think she needs therapy, but will think about it.  CM to follow up at a later time. Patient declines BSC and tub bench.  Substance abuse resources, transportation resources, and social isolation resources added to AVS  Patient states sister will transport at discharge         Patient Goals and CMS Choice            Expected Discharge Plan and Services                                              Prior Living Arrangements/Services                       Activities of Daily Living   ADL Screening (condition at time of admission) Independently performs ADLs?: Yes (appropriate for developmental age) Is the patient deaf or have difficulty hearing?: No Does the patient have difficulty seeing, even when wearing glasses/contacts?: No Does the patient have difficulty concentrating, remembering, or making decisions?: No  Permission Sought/Granted                  Emotional Assessment              Admission diagnosis:  Calculus of gallbladder without cholecystitis without obstruction [K80.20] Acute gallstone pancreatitis [K85.10] Alcohol-induced acute pancreatitis, unspecified complication status [K85.20] Acute pancreatitis, unspecified complication status, unspecified pancreatitis type [K85.90] Patient Active Problem List   Diagnosis Date Noted   Acute gallstone  pancreatitis 11/04/2024   Alcoholic pancreatitis 11/04/2024   Atrial fibrillation, chronic (HCC) 11/04/2024   Chronic diastolic CHF (congestive heart failure) (HCC) 11/04/2024   Pancreatic cyst 11/04/2024   COPD (chronic obstructive pulmonary disease) (HCC) 11/04/2024   Myocardial injury 11/04/2024   Cough with hemoptysis 07/27/2024   Hyperlipemia 07/27/2024   AKI (acute kidney injury) 02/22/2024   Alcohol use 02/22/2024   Demand ischemia (HCC) 02/21/2024   Atrial fibrillation with rapid ventricular response (HCC) 02/18/2024   NSTEMI (non-ST elevated myocardial infarction) (HCC) 02/18/2024   Alcoholic cirrhosis of liver (HCC)    Nausea vomiting and diarrhea 07/28/2023   Nausea and vomiting 07/28/2023   Colitis 07/27/2023   High anion gap metabolic acidosis 07/27/2023   Alcohol use disorder 12/02/2022   Sarcoid 12/02/2022   Hypokalemia 01/05/2022   Hypomagnesemia 01/05/2022   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   Physical deconditioning 01/04/2022   Cholelithiasis without cholecystitis 01/03/2022   Thrombocytopenia 01/03/2022   Aortic atherosclerosis    Acute pancreatitis 01/02/2022   Symptomatic anemia 06/24/2021   Hypertensive urgency 06/24/2021   Asthma 06/24/2021   CKD (chronic kidney disease), stage IIIa 06/24/2021   Chronic CHF (HCC) 06/24/2021   Pancytopenia (HCC) 06/24/2021   Community acquired pneumonia 11/20/2019   Pneumonia  due to COVID-19 virus 11/20/2019   Anemia 06/12/2016   Hypoglycemia 02/11/2016   Hematemesis 04/04/2015   Cirrhosis (HCC) 04/04/2015   Esophageal varices (HCC) 04/04/2015   Hypothyroidism 04/04/2015   Hypertension 04/04/2015   Chest pain 04/04/2015   PCP:  Odell Tor Edra CINDERELLA, MD Pharmacy:   [facility] [address] ([address]), [place] - [address] [place] JACOBS [place]) [place] [address] Phone: [phone] Fax: [phone]     Social Drivers of Health (SDOH) Social History: SDOH Screenings    Food Insecurity: No Food Insecurity (11/04/2024)  Housing: Low Risk (11/04/2024)  Transportation Needs: Unmet Transportation Needs (11/04/2024)  Utilities: Not At Risk (11/04/2024)  Depression (PHQ2-9): Low Risk (12/08/2023)  Social Connections: Socially Isolated (11/04/2024)  Tobacco Use: Low Risk (11/04/2024)   SDOH Interventions:     Readmission Risk Interventions    11/06/2024    1:44 PM 08/01/2024   12:57 PM 12/03/2022   12:32 PM  Readmission Risk Prevention Plan  Transportation Screening Complete Complete Complete  PCP or Specialist Appt within 3-5 Days  Complete Complete  HRI or Home Care Consult  Patient refused   Social Work Consult for Recovery Care Planning/Counseling  Complete Complete  Palliative Care Screening  Not Applicable Not Applicable  Medication Review Oceanographer) Complete Complete Complete  HRI or Home Care Consult Complete    Palliative Care Screening Not Applicable    Skilled Nursing Facility Not Applicable

## 2024-11-06 NOTE — Evaluation (Signed)
 Occupational Therapy Evaluation Patient Details Name: Kathryn [name] MRN: [id number] DOB: August 27, 1956 Today's Date: 11/06/2024   History of Present Illness   Kathryn [name] is a 68 y.o. female with medical history significant of alcohol abuse, alcoholic liver cirrhosis, esophageal varices, portal venous hypertension, portal vein thrombosis A-fib on Eliquis , colitis, sarcoidosis, bicytopenia, GI bleeding, iron  deficiency anemia, HTN, HLD, asthma and COPD,  dCHF, loop recorder in place per pt,  who presents with left flank pain, lower abdominal pain.     Clinical Impressions Chart reviewed to date, pt greeted semi supine in bed, alert and oriented x4. PTA pt reports she is generally MOD I with ADL, sponge bathes at baseline (difficult to get over tub/shower), PRN assist for IADL. Pt presents with deficits in strength, endurance, activity tolerance, balance, affecting safe and optimal ADL completion. Supervision required for bed mobility, STS via HHA with CG A, amb appro x8' with HHA with MIN A. Improved with use of RW to CGA. SET UP required for grooming/feeding tasks. Pt is performing ADL below PLOF, will benefit from acute OT to address functional deficits and to facilitate optimal ADL/functional mobility performance.      If plan is discharge home, recommend the following:   A little help with walking and/or transfers;A little help with bathing/dressing/bathroom     Functional Status Assessment   Patient has had a recent decline in their functional status and demonstrates the ability to make significant improvements in function in a reasonable and predictable amount of time.     Equipment Recommendations   BSC/3in1;Tub/shower bench     Recommendations for Other Services         Precautions/Restrictions   Precautions Precautions: Fall Recall of Precautions/Restrictions: Impaired Restrictions Weight Bearing Restrictions Per Provider Order: No     Mobility Bed  Mobility Overal bed mobility: Needs Assistance Bed Mobility: Supine to Sit, Sit to Supine     Supine to sit: Supervision, HOB elevated, Used rails Sit to supine: Supervision, Used rails        Transfers Overall transfer level: Needs assistance Equipment used: None Transfers: Sit to/from Stand Sit to Stand: Contact guard assist                  Balance Overall balance assessment: Needs assistance Sitting-balance support: Feet supported Sitting balance-Leahy Scale: Good     Standing balance support: Single extremity supported, During functional activity, Reliant on assistive device for balance Standing balance-Leahy Scale: Fair                             ADL either performed or assessed with clinical judgement   ADL Overall ADL's : Needs assistance/impaired     Grooming: Set up;Sitting               Lower Body Dressing: Maximal assistance   Toilet Transfer: Contact guard assist;Rolling walker (2 wheels) Toilet Transfer Details (indicate cue type and reason): simulated         Functional mobility during ADLs: Contact guard assist;Minimal assistance;Rolling walker (2 wheels) (MIN A via HHA approx 8', CGA with RW approx 8')       Vision Patient Visual Report: No change from baseline       Perception         Praxis         Pertinent Vitals/Pain Pain Assessment Pain Assessment: No/denies pain Pain Score: 3  Pain Location: back pain Pain Descriptors / Indicators:  Discomfort, Grimacing, Guarding Pain Intervention(s): Monitored during session, Repositioned     Extremity/Trunk Assessment Upper Extremity Assessment Upper Extremity Assessment: Generalized weakness   Lower Extremity Assessment Lower Extremity Assessment: Generalized weakness   Cervical / Trunk Assessment Cervical / Trunk Assessment: Normal   Communication Communication Communication: No apparent difficulties   Cognition Arousal: Alert Behavior During Therapy:  WFL for tasks assessed/performed Cognition: Cognition impaired           Executive functioning impairment (select all impairments): Problem solving, Reasoning                   Following commands: Intact       Cueing  General Comments   Cueing Techniques: Verbal cues  vss, pt reports continued nausea   Exercises Other Exercises Other Exercises: edu re role of OT, role of rehab, discharge recommendations, importance of continued mobility attempts   Shoulder Instructions      Home Living Family/patient expects to be discharged to:: Private residence Living Arrangements: Other relatives (grandson) Available Help at Discharge: Family;Available PRN/intermittently Type of Home: Apartment Home Access: Level entry     Home Layout: One level     Bathroom Shower/Tub: Tub/shower unit;Sponge bathes at baseline   [employer]: Standard     Home Equipment: Grab bars - tub/shower;Rolling Walker (2 wheels);Rollator (4 wheels);Cane - single point          Prior Functioning/Environment Prior Level of Function : Independent/Modified Independent             Mobility Comments: amb with no AD community distances ADLs Comments: MOD I-I with ADL, has assist with IADL sponge bathes at baseline; does not drive    OT Problem List: Decreased strength;Decreased activity tolerance;Impaired balance (sitting and/or standing);Decreased safety awareness;Decreased cognition;Decreased knowledge of use of DME or AE   OT Treatment/Interventions: Self-care/ADL training;Therapeutic exercise;Energy conservation;DME and/or AE instruction;Patient/family education;Therapeutic activities;Balance training      OT Goals(Current goals can be found in the care plan section)   Acute Rehab OT Goals Patient Stated Goal: go home OT Goal Formulation: With patient Time For Goal Achievement: 11/20/24 Potential to Achieve Goals: Fair ADL Goals Pt Will Perform Grooming: with modified  independence;sitting;standing Pt Will Perform Lower Body Dressing: with modified independence;sitting/lateral leans;sit to/from stand Pt Will Transfer to Toilet: with modified independence;ambulating Pt Will Perform Toileting - Clothing Manipulation and hygiene: with modified independence;sitting/lateral leans;sit to/from stand   OT Frequency:  Min 2X/week    Co-evaluation              AM-PAC OT 6 Clicks Daily Activity     Outcome Measure Help from another person eating meals?: None Help from another person taking care of personal grooming?: None Help from another person toileting, which includes using toliet, bedpan, or urinal?: A Little Help from another person bathing (including washing, rinsing, drying)?: A Little Help from another person to put on and taking off regular upper body clothing?: None Help from another person to put on and taking off regular lower body clothing?: A Lot 6 Click Score: 20   End of Session Equipment Utilized During Treatment: Rolling walker (2 wheels) Nurse Communication:  (mobility tech on floor re importance of continued mobility)  Activity Tolerance: Patient tolerated treatment well Patient left: in bed;with call bell/phone within reach;with bed alarm set  OT Visit Diagnosis: Other abnormalities of gait and mobility (R26.89);Muscle weakness (generalized) (M62.81)                Time: 8954-8942 OT  Time Calculation (min): 12 min Charges:  OT General Charges $OT Visit: 1 Visit OT Evaluation $OT Eval Moderate Complexity: 1 Mod Therisa Sheffield, OTD OTR/L  11/06/2024, 12:59 PM

## 2024-11-06 NOTE — Discharge Instructions (Addendum)
 Intensive Outpatient Programs   [facility] [address] [place],  [place], [place] [phone]  [facility] (Inpatient and outpatient)[phone] (Suboxone and Methadone) [address] [phone]  ADS: Alcohol & Drug [facility] Programs - Intensive Outpatient [address] [place], [place] [address], [place]  [phone]  [facility] (Outpatient, Inpatient, [facility] (Groups and Residental) (insurance only) [phone] [place], [place] [phone]   [facility] (for caregivers and family) [address] [place] [address] [place], [place] [phone]  Residential Treatment Programs  [facility](2 years) Residential: 60 days)[facility] ([facility] [address] [place], [place], [place] [phone] or [phone]  [facility] [address] [place], [place], [place] [phone]  [facility] (RTS) [address] [place], [place], [place] [phone] Admissions: 8am-3pm M-F  [facility] Program: Residential Program [phone] Days)             [facility]: [place]  [facility], [place], [place]  [phone] or [phone] in Hours over the weekend or by referral)  [facility] [address] [place], [place] [address] [phone] (Do virtual or phone assessment, offer transportation within 25 miles, have in patient and Outpatient options)   Mobil Crisis: [facility]:[phone] (for crisis  response 24 hours a day)       Health Visitor for [employer]  Agency Name: [facility] Agency Address: [address] [place], [place] [address] Phone: [phone] Email: [email or web address] Website: [email or web address] Service(s) Offered: Housing services, self-sufficiency, congregate meal program, weatherization program, field seismologist program, emergency food assistance,  housing counseling, home ownership program, wheels-towork program.  Agency Name: [facility] [employer] [phone]) Address: [address] [place], [place] [address] Phone: [phone] Website: [email or web address] Service(s) Offered: Transportation for bluelinx, subscription and demand response; Dial-a-Ride for citizens 67 years of age or older.  Agency Name: Department of Social Services Address: [address] [place], [place] [address] Phone: [phone] Service(s) Offered: Child support services; child welfare services; food stamps; Medicaid; work first family assistance; and aid with fuel,  rent, food and medicine, transportation assistance.  Agency Name: Disabled [employer] (DAV) Transportation  Network Phone: [phone] Service(s) Offered: Transports veterans to the [facility] medical center. Call  forty-eight hours in advance and leave the name, telephone  number, date, and time of appointment. Veteran will be  contacted by the driver the day before the appointment to  arrange a pick up point    [employer] ACTA currently provides door to door services. ACTA connects with PART daily for services to [facility]. ACTA also performs contract services to [employer] operates 27 vehicles, all but 3 mini-vans are equipped with lifts for special needs as well as the general public. ACTA drivers are each CDL certified and trained in First Aid and CPR. ACTA was  established in 2002 by [employer]. An independent Industrial/product Designer. ACTA operates via cytogeneticist with required local 10% match funding from [place]. ACTA provides over 80,000 passenger trips each year, including [facility] and [employer] sites.  Call at least by 11 AM one business day prior to needing transportation  [address]                      [place], [place] [address]     Office Hours: Monday-Friday  8 AM - 5 PM  Do you feel isolated?  The [facility] offers a [employer] that anyone can call toll free at [phone]. The friendship line is available 24 hours a day  [facility] is a Program of All-inclusive Care for the Elderly (PACE). Their mission is to promote and sustain the independence of seniors wishing to remain in the community. They provide seniors with comprehensive long-term health, social, medical and dietary care. Their program is a safe alternative to nursing home care. [phone]  [facility] Physical Address [facility] [address] [place], [place] [address] Phone: [phone]. . Online zoom yoga class, connect with others without leaving your home [facility] offers [place] dance cardio sessions for individuals via Zoom. This program provides: - Dance fitness activities Please contact program for more information. Servinganyone in need adults 18+ hiv/aids individuals families Call [phone]  Email [email or web address] to get more info  [employer] offers an online [employer] to individuals where they can receive help to focus on their best health. Whether you're a [employer] member or not, the neighborhood center offers a... Main Serviceshealth education  exercise & fitness  community support services  recreation  virtual support Other Servicessupport groups Servinganyone in  need adults young adults teens seniors individuals families [email or web address] to get more info  Schedule on their website  [facility] offers an array of activities for adults age 39 and over. This program provides:- Fitness and health programs- Tech classes- Activity books Main Serviceshealth education  community support services  exercise & fitness  recreation  more education Servingseniors  Call [phone]    For more resources go online to [email or web address] and type in you zipcode

## 2024-11-06 NOTE — Progress Notes (Signed)
 Patient ID: Kathryn [name], female   DOB: [birth year], 68 y.o.   MRN: [id number]     SURGICAL PROGRESS NOTE   Hospital Day(s): 2.   Interval History: Patient seen and examined, no acute events or new complaints overnight. Patient reports very good.  She again denies any abdominal pain.  She denies any nausea or vomiting.  No pain in the epigastric or right upper quadrant area.  No pain radiation.  No alleviating or aggravating factors.  Vital signs in last 24 hours: [min-max] current  Temp:  [98.5 F (36.9 C)-98.7 F (37.1 C)] 98.5 F (36.9 C) (12/15 0818) Pulse Rate:  [57-63] 61 (12/15 0818) Resp:  [16-18] 18 (12/15 0818) BP: (119-135)/(65-74) 135/71 (12/15 0818) SpO2:  [99 %-100 %] 100 % (12/15 0818) Weight:  [84.9 kg] 84.9 kg (12/15 0430)     Height: 5' 4 (162.6 cm) Weight: 84.9 kg BMI (Calculated): 32.11   Physical Exam:  Constitutional: alert, cooperative and no distress  Respiratory: breathing non-labored at rest  Cardiovascular: regular rate and sinus rhythm  Gastrointestinal: soft, non-tender, and non-distended  Labs:     Latest Ref Rng & Units 11/06/2024    4:36 AM 11/05/2024    4:27 AM 11/04/2024    2:26 PM  CBC  WBC 4.0 - 10.5 K/uL 3.1  4.4  2.7   Hemoglobin 12.0 - 15.0 g/dL 7.9  9.4  9.1   Hematocrit 36.0 - 46.0 % 23.9  29.2  27.7   Platelets 150 - 400 K/uL 44  55  56       Latest Ref Rng & Units 11/06/2024    4:36 AM 11/05/2024    7:45 AM 11/04/2024   12:08 PM  CMP  Glucose 70 - 99 mg/dL 92  74  76   BUN 8 - 23 mg/dL 21  17  15    Creatinine 0.44 - 1.00 mg/dL 8.80  8.82  8.93   Sodium 135 - 145 mmol/L 141  143  140   Potassium 3.5 - 5.1 mmol/L 4.3  4.4  4.8   Chloride 98 - 111 mmol/L 104  104  103   CO2 22 - 32 mmol/L 25  23  21    Calcium  8.9 - 10.3 mg/dL 7.8  8.4  8.9   Total Protein 6.5 - 8.1 g/dL 6.4  7.2  7.7   Total Bilirubin 0.0 - 1.2 mg/dL 2.2  2.5  1.6   Alkaline Phos 38 - 126 U/L 176  208  239   AST 15 - 41 U/L 211  269  395   ALT 0 - 44 U/L 42   52  63     Imaging studies: No new pertinent imaging studies   Assessment/Plan:  68 y.o. 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   Pancreatitis - Most likely due to alcoholic pancreatitis.  Patient with chronic history of recurrent pancreatitis.  She continue drinking alcohol.  Last drink was vodka last night. - Imaging from the gallbladder standpoint has been unchanged for years -Today she continue pain-free and she is feeling great.  Will advance diet to soft diet. - Continue conservative management   Alcoholic cirrhosis -Patient Child-Pugh B -  Complicated with varices - Consider medical management for portal hypertension reduction manifested as varices.  Consider beta-blocker or diuretics. -Bilirubin, AST/ALT and alkaline phosphatase trending down -Continue medical management   With her imaging from gallbladder standpoint benign unchanged  and patient continuing drinking alcohol, recurrent pancreatitis most likely due to alcoholic etiology.  At this point I do not recommend cholecystectomy.  Patient is child-pugh B, which made her at least 30% mortality from surgical intervention.   Patient continue optimization from the cirrhosis standpoint, especially alcohol abstinence  Lucas Petrin, MD

## 2024-11-06 NOTE — Progress Notes (Signed)
 PROGRESS NOTE    Kathryn [name]  FMW:[id number] DOB: Sep 29, 1956 DOA: 11/04/2024 PCP: Odell Tor Edra CINDERELLA, MD   Assessment & Plan:   Principal Problem:   Acute gallstone pancreatitis Active Problems:   Alcoholic pancreatitis   Alcohol use disorder   Alcoholic cirrhosis of liver (HCC)   Pancytopenia (HCC)   Portal vein thrombosis   Hypertension   Atrial fibrillation, chronic (HCC)   Chronic diastolic CHF (congestive heart failure) (HCC)   Hypothyroidism   CKD (chronic kidney disease), stage IIIa   Hyperlipemia   Pancreatic cyst   Asthma   COPD (chronic obstructive pulmonary disease) (HCC)   Myocardial injury  Assessment and Plan: Likely alcoholic pancreatitis: Lipase 362. CT scan and US -RUQ showed gallstone.  No common duct dilation by ultrasound-RUQ. Unable to do MRCP secondary to loop recorder in place. Continue on IVFs. Diet advanced to soft diet as per gen surg. Compazine  prn and started on scopolamine  patch    Alcohol use disorder: continue on CIWA protocol. Received alcohol cessation counseling already    Alcoholic cirrhosis of liver: continue on home lactulose . 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 No need for a transfusion currently    Portal vein thrombosis: continue on home dose of eliquis   HTN: is WNL currently. Holding nadolol  secondary to bradycardia    Chronic a.fib: rate controlled currently. Continue on eliquis . Holding home dose of nadolol  secondary to bradycardia    Chronic diastolic CHF: echo on 02/20/2024 showed EF of 65-70%.  Patient has trace leg edema, no JVD. Monitor I/Os. Appears compensated    Hypothyroidism: continue on home dose of levothyroxine     CKDIIIa: Cr is labile. Avoid nephrotoxic meds    HLD: not on a statin    Pancreatic cyst: pt has Loop recorder, cannot do MRI. F/u with PCP as outpt to repeat CT scan   Asthma: unknown stage  and/or severity. Continue on bronchodilators and encourage incentive spirometry   COPD: w/o exacerbation. Continue on bronchodilators & encourage incentive spirometry. CXR shows no acute cardiopulmonary process  Myocardial injury: Troponin 19 - > 21, likely due to demand ischemia.  No chest pain.   Abnormal findings of CT scan: CT abdomen/pelvis showed heterogeneous expansion of the endometrial canal spanning approximately 12 mm. Recommend further evaluation with pelvic ultrasound when clinically appropriate. Will need to f/u outpatient w/ obgyn for further evaluation and possible treatment         DVT prophylaxis: eliquis  Code Status: full  Family Communication:  Disposition Plan: likely d/c home   Level of care: Telemetry  Status is: Inpatient Remains inpatient appropriate because: severity of illness    Consultants:    Procedures:   Antimicrobials:    Subjective: Pt c/o nausea   Objective: Vitals:   11/05/24 2030 11/06/24 0130 11/06/24 0430 11/06/24 0818  BP: 124/65  124/74 135/71  Pulse: (!) 58 (!) 59 63 61  Resp: 16  16 18   Temp: 98.5 F (36.9 C)  98.7 F (37.1 C) 98.5 F (36.9 C)  TempSrc: Oral  Oral Oral  SpO2: 99%  100% 100%  Weight:   84.9 kg   Height:        Intake/Output Summary (Last 24 hours) at 11/06/2024 1023 Last data filed at 11/06/2024 0724 Gross per 24 hour  Intake 2885.71 ml  Output --  Net 2885.71 ml   Filed Weights   11/04/24 2005 11/05/24 0509 11/06/24 0430  Weight: 82.6 kg 81.6 kg 84.9 kg    Examination:  General exam: appears comfortable  Respiratory system: clear breath sounds b/l  Cardiovascular system: S1/S2+. No rubs or clicks Gastrointestinal system: abd is soft, NT, obese & hypoactive bowel sounds Central nervous system: alert & awake. Moves all extremities  Psychiatry: Judgement and insight appears at baseline. Flat mood and affect    Data Reviewed: I have personally reviewed following labs and imaging  studies  CBC: Recent Labs  Lab 11/04/24 1426 11/05/24 0427 11/06/24 0436  WBC 2.7* 4.4 3.1*  HGB 9.1* 9.4* 7.9*  HCT 27.7* 29.2* 23.9*  MCV 93.3 95.1 93.7  PLT 56* 55* 44*   Basic Metabolic Panel: Recent Labs  Lab 11/04/24 1208 11/04/24 1940 11/05/24 0745 11/06/24 0436  NA 140  --  143 141  K 4.8  --  4.4 4.3  CL 103  --  104 104  CO2 21*  --  23 25  GLUCOSE 76  --  74 92  BUN 15  --  17 21  CREATININE 1.06*  --  1.17* 1.19*  CALCIUM  8.9  --  8.4* 7.8*  MG  --  2.0  --   --   PHOS  --  3.7  --   --    GFR: Estimated Creatinine Clearance: 47.7 mL/min (A) (by C-G formula based on SCr of 1.19 mg/dL (H)). Liver Function Tests: Recent Labs  Lab 11/04/24 1208 11/05/24 0745 11/06/24 0436  AST 395* 269* 211*  ALT 63* 52* 42  ALKPHOS 239* 208* 176*  BILITOT 1.6* 2.5* 2.2*  PROT 7.7 7.2 6.4*  ALBUMIN 3.5 3.5 3.0*   Recent Labs  Lab 11/04/24 1208 11/04/24 1940  LIPASE 637* 434*   Recent Labs  Lab 11/04/24 1940  AMMONIA 43*   Coagulation Profile: Recent Labs  Lab 11/04/24 1940  INR 1.5*   Cardiac Enzymes: No results for input(s): CKTOTAL, CKMB, CKMBINDEX, TROPONINI in the last 168 hours. BNP (last 3 results) Recent Labs    11/04/24 1940  PROBNP 279.0   HbA1C: No results for input(s): HGBA1C in the last 72 hours. CBG: Recent Labs  Lab 11/05/24 0759 11/06/24 0820  GLUCAP 80 93   Lipid Profile: Recent Labs    11/04/24 1940  TRIG 90   Thyroid Function Tests: No results for input(s): TSH, T4TOTAL, FREET4, T3FREE, THYROIDAB in the last 72 hours. Anemia Panel: No results for input(s): VITAMINB12, FOLATE, FERRITIN, TIBC, IRON , RETICCTPCT in the last 72 hours. Sepsis Labs: No results for input(s): PROCALCITON, LATICACIDVEN in the last 168 hours.  Recent Results (from the past 240 hours)  Resp panel by RT-PCR (RSV, Flu A&B, Covid) Anterior Nasal Swab     Status: None   Collection Time: 11/04/24 11:06 PM    Specimen: Anterior Nasal Swab  Result Value Ref Range Status   SARS Coronavirus 2 by RT PCR NEGATIVE NEGATIVE Final    Comment: (NOTE) SARS-CoV-2 target nucleic acids are NOT DETECTED.  The SARS-CoV-2 RNA is generally detectable in upper respiratory specimens during the acute phase of infection. The lowest concentration of SARS-CoV-2 viral copies this assay can detect is 138 copies/mL. A negative result does not preclude SARS-Cov-2 infection and should not be used as the sole basis for treatment or other patient management decisions. A negative result may occur with  improper specimen collection/handling, submission of specimen other than nasopharyngeal swab, presence of viral mutation(s) within the areas targeted by this assay, and inadequate number of viral copies(<138 copies/mL). A  negative result must be combined with clinical observations, patient history, and epidemiological information. The expected result is Negative.  Fact Sheet for Patients:  bloggercourse.com  Fact Sheet for Healthcare Providers:  seriousbroker.it  This test is no t yet approved or cleared by the [place]  FDA and  has been authorized for detection and/or diagnosis of SARS-CoV-2 by FDA under an Emergency Use Authorization (EUA). This EUA will remain  in effect (meaning this test can be used) for the duration of the COVID-19 declaration under Section 564(b)(1) of the Act, 21 U.S.C.section 360bbb-3(b)(1), unless the authorization is terminated  or revoked sooner.       Influenza A by PCR NEGATIVE NEGATIVE Final   Influenza B by PCR NEGATIVE NEGATIVE Final    Comment: (NOTE) The Xpert Xpress SARS-CoV-2/FLU/RSV plus assay is intended as an aid in the diagnosis of influenza from Nasopharyngeal swab specimens and should not be used as a sole basis for treatment. Nasal washings and aspirates are unacceptable for Xpert Xpress  SARS-CoV-2/FLU/RSV testing.  Fact Sheet for Patients: bloggercourse.com  Fact Sheet for Healthcare Providers: seriousbroker.it  This test is not yet approved or cleared by the [place]  FDA and has been authorized for detection and/or diagnosis of SARS-CoV-2 by FDA under an Emergency Use Authorization (EUA). This EUA will remain in effect (meaning this test can be used) for the duration of the COVID-19 declaration under Section 564(b)(1) of the Act, 21 U.S.C. section 360bbb-3(b)(1), unless the authorization is terminated or revoked.     Resp Syncytial Virus by PCR NEGATIVE NEGATIVE Final    Comment: (NOTE) Fact Sheet for Patients: bloggercourse.com  Fact Sheet for Healthcare Providers: seriousbroker.it  This test is not yet approved or cleared by the [place]  FDA and has been authorized for detection and/or diagnosis of SARS-CoV-2 by FDA under an Emergency Use Authorization (EUA). This EUA will remain in effect (meaning this test can be used) for the duration of the COVID-19 declaration under Section 564(b)(1) of the Act, 21 U.S.C. section 360bbb-3(b)(1), unless the authorization is terminated or revoked.  Performed at [facility], [address], [place], [place] [address]          Radiology Studies: DG Chest [place] 1 View Result Date: 11/04/2024 EXAM: 1 VIEW(S) XRAY OF THE CHEST 11/04/2024 11:34:30 PM COMPARISON: 07/27/2024 CLINICAL HISTORY: SOB (shortness of breath) FINDINGS: LUNGS AND PLEURA: No focal pulmonary opacity. No pleural effusion. No pneumothorax. HEART AND MEDIASTINUM: Left chest cardiac loop recorder or ICD (implantable cardioverter-defibrillator). No acute abnormality of the cardiac and mediastinal silhouettes. BONES AND SOFT TISSUES: No acute osseous abnormality. IMPRESSION: 1. No acute cardiopulmonary process. Electronically signed by:  Oneil Devonshire MD 11/04/2024 11:38 PM EST RP Workstation: [id number]   US  Abdomen Limited RUQ (LIVER/GB) Result Date: 11/04/2024 EXAM: Right Upper Quadrant Abdominal Ultrasound 11/04/2024 04:15:13 PM TECHNIQUE: Real-time ultrasonography of the right upper quadrant of the abdomen was performed. COMPARISON: CT abdomen and pelvis 11/04/2024. CLINICAL HISTORY: Upper abdominal pain. FINDINGS: LIVER: Liver echotexture is increased and coarsened with nodular liver contour. There is limited evaluation of the liver secondary to increased echogenicity. Doppler flow is identified in the portal vein, but color flow is not seen most likely due to limitations of the study. No intrahepatic biliary ductal dilatation. No evidence of mass. BILIARY SYSTEM: No nonmobile small echogenic foci are seen in the neck of the gallbladder, likely gallstones. Gallbladder wall is borderline thickened measuring 3.6 mm. Pericholecystic fluid is present. Common bile duct measures 5.7 mm, which is within  normal limits. RIGHT KIDNEY: The right kidney is grossly unremarkable in appearances without evidence of hydronephrosis, echogenic calculi or worrisome mass lesions. PANCREAS: Visualized portions of the pancreas are unremarkable. OTHER: There is a small amount of free fluid in the right upper quadrant. IMPRESSION: 1. Gallstones in the neck of the gallbladder with borderline wall thickening and cholecystic fluid. Negative sonographic murphy sign. Correlate clinically for acute cholecystitis. 2. Nodular liver contour with increased, coarsened echotexture, compatible with cirrhosis; evaluation limited by increased echogenicity. 3. Small amount of free fluid in the right upper quadrant. Electronically signed by: Greig Pique MD 11/04/2024 04:29 PM EST RP Workstation: [id number]   CT ABDOMEN PELVIS W CONTRAST Result Date: 11/04/2024 CLINICAL DATA:  Pancreatitis, acute, severe EXAM: CT ABDOMEN AND PELVIS WITH CONTRAST TECHNIQUE: Multidetector CT  imaging of the abdomen and pelvis was performed using the standard protocol following bolus administration of intravenous contrast. RADIATION DOSE REDUCTION: This exam was performed according to the departmental dose-optimization program which includes automated exposure control, adjustment of the mA and/or kV according to patient size and/or use of iterative reconstruction technique. CONTRAST:  OMNIPAQUE  IOHEXOL  300 MG/ML  SOLN COMPARISON:  July 27, 2024, July 27, 2023 1 Apr 14, 2020 FINDINGS: Lower chest: No acute abnormality. Hepatobiliary: Profound hepatic steatosis. Nodular contours of the liver. Cholelithiasis with similar circumferential wall prominence of the gallbladder compared to priors. Perihepatic ascites with overlying varicosities along the RIGHT hepatic margin. Similar appearance of a likely hepatic lipoma in the LEFT liver along the LEFT portal vein. Main portal vein is patent. Similar appearance of coarse calcifications along the lateral aspect of the portal vein; common bile duct is not well visualized. Pancreas: There are several small hypoenhancing areas along the pancreatic body and tail measure approximately 5 mm (series 5, image 40, 41, 37). Nonspecific fat stranding throughout the epigastric area. Spleen: Spleen measures the upper limits of normal at 12 cm. Perisplenic collaterals. Adrenals/Urinary Tract: Adrenal glands are unremarkable. Markedly lobulated appearance of bilateral kidneys, similar compared to priors. RIGHT renal cyst measuring 39 mm (for which no dedicated imaging follow-up is recommended). Subcentimeter hypodense lesions are too small to accurately characterize. No hydronephrosis or obstructing nephrolithiasis. Bladder is decompressed and unremarkable. Stomach/Bowel: No evidence of bowel obstruction. Circumferential wall thickening throughout the colon. This is most pronounced in the RIGHT hemicolon and likely reflects a degree of underlying portal colopathy.  Appendix is normal. Diffuse rugal thickening of the stomach with a prominent pylorus, similar compared to priors. Wall thickening of the duodenum. Small hiatal hernia with perigastric varices. Vascular/Lymphatic: Severe atherosclerotic calcifications of the nonaneurysmal abdominal aorta. Reproductive: There is heterogeneous expansion of the endometrial canal spanning approximately 12 mm. Fluid within the endometrial canal superiorly. Multiple calcified and hypoenhancing fibroids. Hypoenhancing exophytic mass along the RIGHT superior fundus, likely a fibroid. Other: Small volume ascites. Diffuse fat stranding throughout the mesentery. Musculoskeletal: Likely hemangioma of L5. Subcortical cystic versus erosive change along bilateral sacroiliac joints, similar compared to priors. IMPRESSION: 1. Profound hepatic steatosis with nodular contours of the liver, suggestive of cirrhosis. There is sequela of portal hypertension including small volume ascites, multiple abdominal collaterals, and favored portal colopathy. 2. Cholelithiasis with similar circumferential wall prominence of the gallbladder compared to priors. This is favored to reflect sequela of underlying liver disease. If there is clinical concern for acute cholecystitis, recommend dedicated RIGHT upper quadrant ultrasound. 3. Nonspecific fat stranding throughout the epigastric area. This could reflect sequela of portal hypertension, pancreatitis or sequela of duodenitis. Recommend correlation with  lipase levels. 4. There is wall thickening of the stomach, duodenum and colon. This is nonspecific and could reflect a nonspecific enteritis or sequela of liver disease. 5. There are several small hypoenhancing areas along the pancreatic body and tail measuring approximately 5 mm. These are nonspecific and could reflect small pseudocysts or side branch IPMNs. Recommend further evaluation with nonemergent outpatient pancreatic protocol MRI with and without contrast. 6.  There is heterogeneous expansion of the endometrial canal spanning approximately 12 mm. Recommend further evaluation with pelvic ultrasound when clinically appropriate. Aortic Atherosclerosis (ICD10-I70.0). Electronically Signed   By: Corean Salter M.D.   On: 11/04/2024 14:55        Scheduled Meds:  apixaban   5 mg Oral BID   ferrous sulfate   325 mg Oral Q breakfast   folic acid   1 mg Oral Daily   lactulose   20 g Oral BID   levothyroxine   112 mcg Oral Q0600   lidocaine   1 patch Transdermal Q24H   LORazepam   0-4 mg Intravenous Q6H   Followed by   LORazepam   0-4 mg Intravenous Q12H   multivitamin with minerals  1 tablet Oral Daily   pantoprazole   40 mg Oral Daily   thiamine   100 mg Oral Daily   Or   thiamine   100 mg Intravenous Daily   Continuous Infusions:  sodium chloride  75 mL/hr at 11/06/24 0724     LOS: 2 days       Anthony CHRISTELLA Pouch, MD [facility] Pager [phone]  If 7PM-7AM, please contact night-coverage [email or web address] 11/06/2024, 10:23 AM

## 2024-11-06 NOTE — Care Management Important Message (Signed)
 Important Message  Patient Details  Name: Kathryn [name] MRN: [id number] Date of Birth: 1956/05/19   Important Message Given:  Yes - Medicare IM     [name] 11/06/2024, 2:53 PM

## 2024-11-06 NOTE — Evaluation (Signed)
 Physical Therapy Evaluation Patient Details Name: Kathryn [name] MRN: [id number] DOB: [birth year] Today's Date: 11/06/2024  History of Present Illness  Kathryn [name] is a 68 y.o. female with medical history significant of alcohol abuse, alcoholic liver cirrhosis, esophageal varices, portal venous hypertension, portal vein thrombosis A-fib on Eliquis , colitis, sarcoidosis, bicytopenia, GI bleeding, iron  deficiency anemia, HTN, HLD, asthma and COPD,  dCHF, loop recorder in place per pt,  who presents with left flank pain, lower abdominal pain.   Clinical Impression  Patient received in bed, she called out to nursing to use [facility]. Patient is mod I for bed mobility and cga for sit to stand and transfer to [facility]. She then ambulated around bed to recliner with cga using IV pole and bed for single UE support during mobility. She is nauseated and spitting up once seated in recliner. Reports she had been given nausea medicine prior to session. She will continue to benefit from skilled PT to improve independence and safety with mobility.         If plan is discharge home, recommend the following: A little help with walking and/or transfers;A little help with bathing/dressing/bathroom;Assist for transportation   Can travel by private vehicle    yes    Equipment Recommendations None recommended by PT  Recommendations for Other Services       Functional Status Assessment Patient has had a recent decline in their functional status and demonstrates the ability to make significant improvements in function in a reasonable and predictable amount of time.     Precautions / Restrictions Precautions Precautions: Fall Recall of Precautions/Restrictions: Impaired Restrictions Weight Bearing Restrictions Per Provider Order: No      Mobility  Bed Mobility Overal bed mobility: Modified Independent                  Transfers Overall transfer level: Needs assistance Equipment used: None Transfers: Sit  to/from Stand, Bed to chair/wheelchair/BSC Sit to Stand: Contact guard assist   Step pivot transfers: Contact guard assist            Ambulation/Gait Ambulation/Gait assistance: Contact guard assist Gait Distance (Feet): 15 Feet Assistive device: None, IV Pole Gait Pattern/deviations: Step-through pattern, Decreased step length - right, Decreased step length - left, Decreased stride length, Trunk flexed Gait velocity: decr     General Gait Details: patient reaching for furniture, IV pole for steadying during ambulation. Distance limited by nausea. Patient spitting up into emesis bag after getting into recliner.  Stairs            Wheelchair Mobility     Tilt Bed    Modified Rankin (Stroke Patients Only)       Balance Overall balance assessment: Needs assistance Sitting-balance support: Feet supported Sitting balance-Leahy Scale: Normal     Standing balance support: Single extremity supported, During functional activity, Reliant on assistive device for balance Standing balance-Leahy Scale: Fair                               Pertinent Vitals/Pain Pain Assessment Pain Assessment: Faces Faces Pain Scale: Hurts a little bit Pain Location: abdomen Pain Descriptors / Indicators: Discomfort, Grimacing, Guarding Pain Intervention(s): Monitored during session, Repositioned    Home Living Family/patient expects to be discharged to:: Private residence Living Arrangements: Other relatives Available Help at Discharge: Family;Available PRN/intermittently Type of Home: Apartment Home Access: Level entry       Home Layout: One level Home  Equipment: Grab bars - tub/shower;Rolling Walker (2 wheels);Rollator (4 wheels);Cane - single point      Prior Function Prior Level of Function : Independent/Modified Independent             Mobility Comments: No AD use at baseline; pt reports 1 fall in Jan/February d/t passing out. Does not drive, family takes  her to store as needed ADLs Comments: Independent     Extremity/Trunk Assessment   Upper Extremity Assessment Upper Extremity Assessment: Defer to OT evaluation    Lower Extremity Assessment Lower Extremity Assessment: Generalized weakness    Cervical / Trunk Assessment Cervical / Trunk Assessment: Normal  Communication   Communication Communication: No apparent difficulties    Cognition Arousal: Alert Behavior During Therapy: WFL for tasks assessed/performed   PT - Cognitive impairments: No apparent impairments                         Following commands: Intact       Cueing Cueing Techniques: Verbal cues     General Comments      Exercises     Assessment/Plan    PT Assessment Patient needs continued PT services  PT Problem List Decreased activity tolerance;Decreased balance;Decreased mobility;Obesity;Pain       PT Treatment Interventions DME instruction;Gait training;Functional mobility training;Therapeutic activities;Therapeutic exercise;Patient/family education    PT Goals (Current goals can be found in the Care Plan section)  Acute Rehab PT Goals Patient Stated Goal: to return home when feeling better PT Goal Formulation: With patient Time For Goal Achievement: 11/20/24 Potential to Achieve Goals: Good    Frequency Min 2X/week     Co-evaluation               AM-PAC PT 6 Clicks Mobility  Outcome Measure Help needed turning from your back to your side while in a flat bed without using bedrails?: None Help needed moving from lying on your back to sitting on the side of a flat bed without using bedrails?: None Help needed moving to and from a bed to a chair (including a wheelchair)?: A Little Help needed standing up from a chair using your arms (e.g., wheelchair or bedside chair)?: A Little Help needed to walk in hospital room?: A Little Help needed climbing 3-5 steps with a railing? : A Little 6 Click Score: 20    End of  Session   Activity Tolerance: Patient limited by pain;Other (comment) (nausea) Patient left: in chair;with call bell/phone within reach;with chair alarm set Nurse Communication: Mobility status PT Visit Diagnosis: Other abnormalities of gait and mobility (R26.89);Muscle weakness (generalized) (M62.81);Pain;Unsteadiness on feet (R26.81) Pain - part of body:  (abdomen)    Time: 9091-9078 PT Time Calculation (min) (ACUTE ONLY): 13 min   Charges:   PT Evaluation $PT Eval Low Complexity: 1 Low   PT General Charges $$ ACUTE PT VISIT: 1 Visit         Aarsh Fristoe, PT, GCS 11/06/2024,10:03 AM

## 2024-11-07 ENCOUNTER — Inpatient Hospital Stay

## 2024-11-07 ENCOUNTER — Telehealth: Payer: Self-pay

## 2024-11-07 DIAGNOSIS — K852 Alcohol induced acute pancreatitis without necrosis or infection: Secondary | ICD-10-CM | POA: Diagnosis not present

## 2024-11-07 LAB — COMPREHENSIVE METABOLIC PANEL WITH GFR
ALT: 51 U/L — ABNORMAL HIGH (ref 0–44)
AST: 321 U/L — ABNORMAL HIGH (ref 15–41)
Albumin: 2.9 g/dL — ABNORMAL LOW (ref 3.5–5.0)
Alkaline Phosphatase: 236 U/L — ABNORMAL HIGH (ref 38–126)
Anion gap: 11 (ref 5–15)
BUN: 21 mg/dL (ref 8–23)
CO2: 24 mmol/L (ref 22–32)
Calcium: 7.9 mg/dL — ABNORMAL LOW (ref 8.9–10.3)
Chloride: 106 mmol/L (ref 98–111)
Creatinine, Ser: 1.16 mg/dL — ABNORMAL HIGH (ref 0.44–1.00)
GFR, Estimated: 51 mL/min — ABNORMAL LOW (ref >60)
Glucose, Bld: 106 mg/dL — ABNORMAL HIGH (ref 70–99)
Potassium: 4.1 mmol/L (ref 3.5–5.1)
Sodium: 141 mmol/L (ref 135–145)
Total Bilirubin: 3.2 mg/dL — ABNORMAL HIGH (ref 0.0–1.2)
Total Protein: 5.8 g/dL — ABNORMAL LOW (ref 6.5–8.1)

## 2024-11-07 LAB — CBC
HCT: 18.3 % — ABNORMAL LOW (ref 36.0–46.0)
Hemoglobin: 6 g/dL — ABNORMAL LOW (ref 12.0–15.0)
MCH: 30.5 pg (ref 26.0–34.0)
MCHC: 32.8 g/dL (ref 30.0–36.0)
MCV: 92.9 fL (ref 80.0–100.0)
Platelets: 69 K/uL — ABNORMAL LOW (ref 150–400)
RBC: 1.97 MIL/uL — ABNORMAL LOW (ref 3.87–5.11)
RDW: 23.8 % — ABNORMAL HIGH (ref 11.5–15.5)
WBC: 4.6 K/uL (ref 4.0–10.5)
nRBC: 6.8 % — ABNORMAL HIGH (ref 0.0–0.2)

## 2024-11-07 LAB — HEMOGLOBIN AND HEMATOCRIT, BLOOD
HCT: 20.9 % — ABNORMAL LOW (ref 36.0–46.0)
Hemoglobin: 7.1 g/dL — ABNORMAL LOW (ref 12.0–15.0)

## 2024-11-07 LAB — BILIRUBIN, DIRECT: Bilirubin, Direct: 2.2 mg/dL — ABNORMAL HIGH (ref 0.0–0.2)

## 2024-11-07 LAB — PROTIME-INR
INR: 2.7 — ABNORMAL HIGH (ref 0.8–1.2)
Prothrombin Time: 29.7 s — ABNORMAL HIGH (ref 11.4–15.2)

## 2024-11-07 LAB — PREPARE RBC (CROSSMATCH)

## 2024-11-07 LAB — GLUCOSE, CAPILLARY: Glucose-Capillary: 103 mg/dL — ABNORMAL HIGH (ref 70–99)

## 2024-11-07 MED ORDER — OXYCODONE-ACETAMINOPHEN 5-325 MG PO TABS
1.0000 | ORAL_TABLET | Freq: Four times a day (QID) | ORAL | Status: DC | PRN
  Administered 2024-11-07: 10:00:00 1 via ORAL
  Filled 2024-11-07: qty 1

## 2024-11-07 MED ORDER — SODIUM CHLORIDE 0.9% IV SOLUTION: Freq: Once | INTRAVENOUS | Status: AC

## 2024-11-07 NOTE — TOC Progression Note (Addendum)
 Transition of Care [facility]) - Progression Note    Patient Details  Name: Kathryn [name] MRN: [id number] Date of Birth: 1956-11-01  [facility] of [facility]) CM/SW Contact  Corean ONEIDA Haddock, RN Phone Number: 11/07/2024, 12:10 PM  Clinical Narrative:        Met with patient at bedside to follow up about home health.  Patient states that she is in agreement, and does not have a preference of agency.  Referral sent out in [place]. Patient declines any DME at discharge     Update:  Accepted [facility] for home health services in the [place].  Notified Dorothe with [facility]             Expected Discharge Plan and Services                                               Social Drivers of Health (SDOH) Interventions SDOH Screenings   Food Insecurity: No Food Insecurity (11/04/2024)  Housing: Low Risk (11/04/2024)  Transportation Needs: Unmet Transportation Needs (11/04/2024)  Utilities: Not At Risk (11/04/2024)  Depression (PHQ2-9): Low Risk (12/08/2023)  Social Connections: Socially Isolated (11/04/2024)  Tobacco Use: Low Risk (11/04/2024)    Readmission Risk Interventions    11/06/2024    1:44 PM 08/01/2024   12:57 PM 12/03/2022   12:32 PM  Readmission Risk Prevention Plan  Transportation Screening Complete Complete Complete  PCP or Specialist Appt within 3-5 Days  Complete Complete  HRI or Home Care Consult  Patient refused   Social Work Consult for Recovery Care Planning/Counseling  Complete Complete  Palliative Care Screening  Not Applicable Not Applicable  Medication Review Oceanographer) Complete Complete Complete  HRI or Home Care Consult Complete    Palliative Care Screening Not Applicable    Skilled Nursing Facility Not Applicable

## 2024-11-07 NOTE — Telephone Encounter (Signed)
 Received call from hospital requesting MRI clearance for loop recorder.  Patient has a Linq22 ILR device.  This device is MRI compatible.  Verbal okay given over the phone.

## 2024-11-07 NOTE — Progress Notes (Signed)
 MRI attempted unable to obtain a complete with and without exam Pt uncooperative and refused part way through. Scanning technologist sent the best obtainable images to be read. Contrasted portion of the exam was not able to be done due to pt refusing

## 2024-11-07 NOTE — Progress Notes (Signed)
 PROGRESS NOTE    Kathryn [name]  FMW:[id number] DOB: [birth year] DOA: 11/04/2024 PCP: Odell Tor Edra CINDERELLA, MD   Assessment & Plan:   Principal Problem:   Acute gallstone pancreatitis Active Problems:   Alcoholic pancreatitis   Alcohol use disorder   Alcoholic cirrhosis of liver (HCC)   Pancytopenia (HCC)   Portal vein thrombosis   Hypertension   Atrial fibrillation, chronic (HCC)   Chronic diastolic CHF (congestive heart failure) (HCC)   Hypothyroidism   CKD (chronic kidney disease), stage IIIa   Hyperlipemia   Pancreatic cyst   Asthma   COPD (chronic obstructive pulmonary disease) (HCC)   Myocardial injury  Assessment and Plan: Likely alcoholic pancreatitis: Lipase 362. CT scan and US -RUQ showed gallstone.  No common duct dilation by ultrasound-RUQ. MRCP ordered as pt previously tolerated MRI w/ loop recorder. Continue on IVFs. Compazine  prn.    Alcohol use disorder: continue on CIWA protocol. Received alcohol cessation counseling already    Alcoholic cirrhosis of liver: continue on home lactulose . 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 Will give 1 unit of pRBCs today for Hb 6.0. Repeat H&H ordered. Dark stools as per pt so d/c po iron  pills and will monitor. Hx of esophageal varices as per pt   Portal vein thrombosis: holding home dose of eliquis    HTN: is WNL currently. Holding nadolol  secondary to bradycardia    Chronic a.fib: rate controlled currently. Holding home eliquis . Holding nadolol  secondary to bradycardia    Chronic diastolic CHF: echo on 02/20/2024 showed EF of 65-70%.  Patient has trace leg edema, no JVD. Monitor I/Os. Appears compensated  Hypothyroidism: continue on home dose of levothyroxine      CKDIIIa: Cr is labile. Avoid nephrotoxic meds    HLD: not on a statin    Pancreatic cyst: MRCP ordered today    Asthma: unknown stage and/or severity.  Bronchodilators prn and encourage incentive spirometry   COPD: w/o exacerbation. Continue on bronchodilators & encourage incentive spirometry. CXR shows no acute cardiopulmonary process  Myocardial injury: Troponin 19 - > 21, likely due to demand ischemia.  No chest pain.   Abnormal findings of CT scan: CT abdomen/pelvis showed heterogeneous expansion of the endometrial canal spanning approximately 12 mm. Recommend further evaluation with pelvic ultrasound when clinically appropriate. Will need to f/u outpatient w/ obgyn for further evaluation and possible treatment         DVT prophylaxis: eliquis  Code Status: full  Family Communication:  Disposition Plan: likely d/c home   Level of care: Telemetry  Status is: Inpatient Remains inpatient appropriate because: severity of illness, MRCP ordered and required pRBC transfusion today     Consultants:    Procedures:   Antimicrobials:    Subjective: Pt c/o nausea & abd pain   Objective: Vitals:   11/06/24 1509 11/06/24 2027 11/07/24 0148 11/07/24 0155  BP: 133/77 116/66 123/64   Pulse: 60 62 62   Resp: 16 18 18    Temp: 98.1 F (36.7 C) 98.7 F (37.1 C) 98.5 F (36.9 C)   TempSrc:      SpO2: 100% 100% 99%   Weight:    87.3 kg  Height:        Intake/Output Summary (Last 24 hours) at 11/07/2024 1016 Last data filed at 11/07/2024 0613 Gross per 24 hour  Intake 1794.1 ml  Output --  Net 1794.1 ml   Filed Weights   11/05/24 0509  11/06/24 0430 11/07/24 0155  Weight: 81.6 kg 84.9 kg 87.3 kg    Examination:  General exam: appears uncomfortable   Respiratory system: clear breath sounds b/l  Cardiovascular system: S1 & S2+. No rubs or clicks Gastrointestinal system: abd is soft, NT, obese & hypoactive bowel sounds Central nervous system: alert & awake. Moves all extremities   Psychiatry: Judgement and insight appears at baseline. Flat mood and affect   Data Reviewed: I have personally reviewed following labs  and imaging studies  CBC: Recent Labs  Lab 11/04/24 1426 11/05/24 0427 11/06/24 0436 11/07/24 0331  WBC 2.7* 4.4 3.1* 4.6  HGB 9.1* 9.4* 7.9* 6.0*  HCT 27.7* 29.2* 23.9* 18.3*  MCV 93.3 95.1 93.7 92.9  PLT 56* 55* 44* 69*   Basic Metabolic Panel: Recent Labs  Lab 11/04/24 1208 11/04/24 1940 11/05/24 0745 11/06/24 0436 11/07/24 0331  NA 140  --  143 141 141  K 4.8  --  4.4 4.3 4.1  CL 103  --  104 104 106  CO2 21*  --  23 25 24   GLUCOSE 76  --  74 92 106*  BUN 15  --  17 21 21   CREATININE 1.06*  --  1.17* 1.19* 1.16*  CALCIUM  8.9  --  8.4* 7.8* 7.9*  MG  --  2.0  --   --   --   PHOS  --  3.7  --   --   --    GFR: Estimated Creatinine Clearance: 49.6 mL/min (A) (by C-G formula based on SCr of 1.16 mg/dL (H)). Liver Function Tests: Recent Labs  Lab 11/04/24 1208 11/05/24 0745 11/06/24 0436 11/07/24 0331  AST 395* 269* 211* 321*  ALT 63* 52* 42 51*  ALKPHOS 239* 208* 176* 236*  BILITOT 1.6* 2.5* 2.2* 3.2*  PROT 7.7 7.2 6.4* 5.8*  ALBUMIN 3.5 3.5 3.0* 2.9*   Recent Labs  Lab 11/04/24 1208 11/04/24 1940  LIPASE 637* 434*   Recent Labs  Lab 11/04/24 1940  AMMONIA 43*   Coagulation Profile: Recent Labs  Lab 11/04/24 1940  INR 1.5*   Cardiac Enzymes: No results for input(s): CKTOTAL, CKMB, CKMBINDEX, TROPONINI in the last 168 hours. BNP (last 3 results) Recent Labs    11/04/24 1940  PROBNP 279.0   HbA1C: No results for input(s): HGBA1C in the last 72 hours. CBG: Recent Labs  Lab 11/05/24 0759 11/06/24 0820 11/07/24 0721  GLUCAP 80 93 103*   Lipid Profile: Recent Labs    11/04/24 1940  TRIG 90   Thyroid Function Tests: No results for input(s): TSH, T4TOTAL, FREET4, T3FREE, THYROIDAB in the last 72 hours. Anemia Panel: No results for input(s): VITAMINB12, FOLATE, FERRITIN, TIBC, IRON , RETICCTPCT in the last 72 hours. Sepsis Labs: No results for input(s): PROCALCITON, LATICACIDVEN in the last 168  hours.  Recent Results (from the past 240 hours)  Resp panel by RT-PCR (RSV, Flu A&B, Covid) Anterior Nasal Swab     Status: None   Collection Time: 11/04/24 11:06 PM   Specimen: Anterior Nasal Swab  Result Value Ref Range Status   SARS Coronavirus 2 by RT PCR NEGATIVE NEGATIVE Final    Comment: (NOTE) SARS-CoV-2 target nucleic acids are NOT DETECTED.  The SARS-CoV-2 RNA is generally detectable in upper respiratory specimens during the acute phase of infection. The lowest concentration of SARS-CoV-2 viral copies this assay can detect is 138 copies/mL. A negative result does not preclude SARS-Cov-2 infection and should not be used as the sole basis for  treatment or other patient management decisions. A negative result may occur with  improper specimen collection/handling, submission of specimen other than nasopharyngeal swab, presence of viral mutation(s) within the areas targeted by this assay, and inadequate number of viral copies(<138 copies/mL). A negative result must be combined with clinical observations, patient history, and epidemiological information. The expected result is Negative.  Fact Sheet for Patients:  bloggercourse.com  Fact Sheet for Healthcare Providers:  seriousbroker.it  This test is no t yet approved or cleared by the [place]  FDA and  has been authorized for detection and/or diagnosis of SARS-CoV-2 by FDA under an Emergency Use Authorization (EUA). This EUA will remain  in effect (meaning this test can be used) for the duration of the COVID-19 declaration under Section 564(b)(1) of the Act, 21 U.S.C.section 360bbb-3(b)(1), unless the authorization is terminated  or revoked sooner.       Influenza A by PCR NEGATIVE NEGATIVE Final   Influenza B by PCR NEGATIVE NEGATIVE Final    Comment: (NOTE) The Xpert Xpress SARS-CoV-2/FLU/RSV plus assay is intended as an aid in the diagnosis of influenza from  Nasopharyngeal swab specimens and should not be used as a sole basis for treatment. Nasal washings and aspirates are unacceptable for Xpert Xpress SARS-CoV-2/FLU/RSV testing.  Fact Sheet for Patients: bloggercourse.com  Fact Sheet for Healthcare Providers: seriousbroker.it  This test is not yet approved or cleared by the [place]  FDA and has been authorized for detection and/or diagnosis of SARS-CoV-2 by FDA under an Emergency Use Authorization (EUA). This EUA will remain in effect (meaning this test can be used) for the duration of the COVID-19 declaration under Section 564(b)(1) of the Act, 21 U.S.C. section 360bbb-3(b)(1), unless the authorization is terminated or revoked.     Resp Syncytial Virus by PCR NEGATIVE NEGATIVE Final    Comment: (NOTE) Fact Sheet for Patients: bloggercourse.com  Fact Sheet for Healthcare Providers: seriousbroker.it  This test is not yet approved or cleared by the [place]  FDA and has been authorized for detection and/or diagnosis of SARS-CoV-2 by FDA under an Emergency Use Authorization (EUA). This EUA will remain in effect (meaning this test can be used) for the duration of the COVID-19 declaration under Section 564(b)(1) of the Act, 21 U.S.C. section 360bbb-3(b)(1), unless the authorization is terminated or revoked.  Performed at [facility], [address] [place], [place] [address]          Radiology Studies: No results found.       Scheduled Meds:  sodium chloride    Intravenous Once   ferrous sulfate   325 mg Oral Q breakfast   folic acid   1 mg Oral Daily   lactulose   20 g Oral BID   levothyroxine   112 mcg Oral Q0600   lidocaine   1 patch Transdermal Q24H   LORazepam   0-4 mg Intravenous Q12H   multivitamin with minerals  1 tablet Oral Daily   pantoprazole   40 mg Oral Daily   scopolamine   1 patch  Transdermal Q72H   thiamine   100 mg Oral Daily   Or   thiamine   100 mg Intravenous Daily   Continuous Infusions:  sodium chloride  75 mL/hr at 11/07/24 0815     LOS: 3 days       Anthony CHRISTELLA Pouch, MD [facility] Pager [phone]  If 7PM-7AM, please contact night-coverage [email or web address] 11/07/2024, 10:16 AM

## 2024-11-07 NOTE — Progress Notes (Signed)
 Patient ID: MANIE BEALER, female   DOB: 1956-01-17, 68 y.o.   MRN: [id number]     SURGICAL PROGRESS NOTE   Hospital Day(s): 3.   Interval History: Patient seen and examined, no acute events or new complaints overnight. Patient denies abdominal pain but endorses she has been having diarrhea since she got lactulose .  Patient denies any more nausea after eating during the day.  Her main concern is diarrhea because of the lactulose .  No abdominal pain.  No epigastric pain.  No radiation.  No alleviating or aggravating factors.  Vital signs in last 24 hours: [min-max] current  Temp:  [98.1 F (36.7 C)-98.7 F (37.1 C)] 98.3 F (36.8 C) (12/16 1307) Pulse Rate:  [59-64] 64 (12/16 1307) Resp:  [16-18] 16 (12/16 1307) BP: (101-133)/(63-77) 101/63 (12/16 1307) SpO2:  [99 %-100 %] 100 % (12/16 1307) Weight:  [87.3 kg] 87.3 kg (12/16 0155)     Height: 5' 4 (162.6 cm) Weight: 87.3 kg BMI (Calculated): 33.02   Physical Exam:  Constitutional: alert, cooperative and no distress  Respiratory: breathing non-labored at rest  Cardiovascular: regular rate and sinus rhythm  Gastrointestinal: soft, non-tender, and non-distended  Labs:     Latest Ref Rng & Units 11/07/2024    3:31 AM 11/06/2024    4:36 AM 11/05/2024    4:27 AM  CBC  WBC 4.0 - 10.5 K/uL 4.6  3.1  4.4   Hemoglobin 12.0 - 15.0 g/dL 6.0  7.9  9.4   Hematocrit 36.0 - 46.0 % 18.3  23.9  29.2   Platelets 150 - 400 K/uL 69  44  55       Latest Ref Rng & Units 11/07/2024    3:31 AM 11/06/2024    4:36 AM 11/05/2024    7:45 AM  CMP  Glucose 70 - 99 mg/dL 893  92  74   BUN 8 - 23 mg/dL 21  21  17    Creatinine 0.44 - 1.00 mg/dL 8.83  8.80  8.82   Sodium 135 - 145 mmol/L 141  141  143   Potassium 3.5 - 5.1 mmol/L 4.1  4.3  4.4   Chloride 98 - 111 mmol/L 106  104  104   CO2 22 - 32 mmol/L 24  25  23    Calcium  8.9 - 10.3 mg/dL 7.9  7.8  8.4   Total Protein 6.5 - 8.1 g/dL 5.8  6.4  7.2   Total Bilirubin 0.0 - 1.2 mg/dL 3.2  2.2  2.5    Alkaline Phos 38 - 126 U/L 236  176  208   AST 15 - 41 U/L 321  211  269   ALT 0 - 44 U/L 51  42  52     Imaging studies: No new pertinent imaging studies   Assessment/Plan:  68 y.o. 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   Pancreatitis - Most likely due to alcoholic pancreatitis.  Patient with chronic history of recurrent pancreatitis.  She continue drinking alcohol.  - Imaging from the gallbladder standpoint has been unchanged for years - No significant abdominal pain - Continue medical management   Alcoholic cirrhosis - I have concern of worsening liver failure -INR increased from 1.5-2.7 -Bilirubin also increasing -Patient now Child Pugh C. Score 11.  Abdominal paraparetic mortality of 82% -Also decreasing hemoglobin to 6.0.  Patient received 1 unit of PRBC -Patient with history of esophageal varices -  Consider GI evaluation for further management and recommendations -I recommend MRCP for further evaluation of possible choledocholithiasis.  I contacted patient's cardiology office and they reported that her cardiac device is MRI compatible.    Lucas Petrin, MD

## 2024-11-08 ENCOUNTER — Inpatient Hospital Stay

## 2024-11-08 DIAGNOSIS — R579 Shock, unspecified: Secondary | ICD-10-CM | POA: Diagnosis not present

## 2024-11-08 DIAGNOSIS — D62 Acute posthemorrhagic anemia: Secondary | ICD-10-CM | POA: Diagnosis not present

## 2024-11-08 DIAGNOSIS — R569 Unspecified convulsions: Secondary | ICD-10-CM

## 2024-11-08 DIAGNOSIS — K921 Melena: Secondary | ICD-10-CM | POA: Diagnosis not present

## 2024-11-08 DIAGNOSIS — I8500 Esophageal varices without bleeding: Secondary | ICD-10-CM

## 2024-11-08 DIAGNOSIS — K851 Biliary acute pancreatitis without necrosis or infection: Secondary | ICD-10-CM | POA: Diagnosis not present

## 2024-11-08 DIAGNOSIS — R4020 Unspecified coma: Secondary | ICD-10-CM | POA: Diagnosis not present

## 2024-11-08 DIAGNOSIS — J9601 Acute respiratory failure with hypoxia: Secondary | ICD-10-CM | POA: Diagnosis not present

## 2024-11-08 DIAGNOSIS — R9401 Abnormal electroencephalogram [EEG]: Secondary | ICD-10-CM | POA: Diagnosis not present

## 2024-11-08 DIAGNOSIS — K922 Gastrointestinal hemorrhage, unspecified: Secondary | ICD-10-CM

## 2024-11-08 DIAGNOSIS — K766 Portal hypertension: Secondary | ICD-10-CM

## 2024-11-08 DIAGNOSIS — K852 Alcohol induced acute pancreatitis without necrosis or infection: Secondary | ICD-10-CM | POA: Diagnosis not present

## 2024-11-08 DIAGNOSIS — Z7189 Other specified counseling: Secondary | ICD-10-CM

## 2024-11-08 DIAGNOSIS — F1010 Alcohol abuse, uncomplicated: Secondary | ICD-10-CM

## 2024-11-08 DIAGNOSIS — J449 Chronic obstructive pulmonary disease, unspecified: Secondary | ICD-10-CM

## 2024-11-08 DIAGNOSIS — K7290 Hepatic failure, unspecified without coma: Secondary | ICD-10-CM

## 2024-11-08 LAB — LACTIC ACID, PLASMA
Lactic Acid, Venous: >9 mmol/L (ref 0.5–1.9)
Lactic Acid, Venous: 8.2 mmol/L (ref 0.5–1.9)
Lactic Acid, Venous: 6 mmol/L (ref 0.5–1.9)
Lactic Acid, Venous: 5.7 mmol/L (ref 0.5–1.9)

## 2024-11-08 LAB — BLOOD GAS, ARTERIAL
Acid-base deficit: 9.2 mmol/L — ABNORMAL HIGH (ref 0.0–2.0)
Bicarbonate: 15.1 mmol/L — ABNORMAL LOW (ref 20.0–28.0)
FIO2: 100 %
MECHVT: 450 mL
O2 Saturation: 100 %
PEEP: 5 cmH2O
Patient temperature: 37
RATE: 15 {breaths}/min
pCO2 arterial: 28 mmHg — ABNORMAL LOW (ref 32–48)
pH, Arterial: 7.34 — ABNORMAL LOW (ref 7.35–7.45)
pO2, Arterial: 427 mmHg — ABNORMAL HIGH (ref 83–108)

## 2024-11-08 LAB — CBC
HCT: 16.5 % — ABNORMAL LOW (ref 36.0–46.0)
Hemoglobin: 5.5 g/dL — ABNORMAL LOW (ref 12.0–15.0)
MCH: 30.6 pg (ref 26.0–34.0)
MCHC: 33.3 g/dL (ref 30.0–36.0)
MCV: 91.7 fL (ref 80.0–100.0)
Platelets: 92 K/uL — ABNORMAL LOW (ref 150–400)
RBC: 1.8 MIL/uL — ABNORMAL LOW (ref 3.87–5.11)
RDW: 22.8 % — ABNORMAL HIGH (ref 11.5–15.5)
WBC: 8.1 K/uL (ref 4.0–10.5)
nRBC: 15.3 % — ABNORMAL HIGH (ref 0.0–0.2)

## 2024-11-08 LAB — COMPREHENSIVE METABOLIC PANEL WITH GFR
ALT: 44 U/L (ref 0–44)
AST: 187 U/L — ABNORMAL HIGH (ref 15–41)
Albumin: 2.5 g/dL — ABNORMAL LOW (ref 3.5–5.0)
Alkaline Phosphatase: 150 U/L — ABNORMAL HIGH (ref 38–126)
Anion gap: 21 — ABNORMAL HIGH (ref 5–15)
BUN: 30 mg/dL — ABNORMAL HIGH (ref 8–23)
CO2: 16 mmol/L — ABNORMAL LOW (ref 22–32)
Calcium: 7.6 mg/dL — ABNORMAL LOW (ref 8.9–10.3)
Chloride: 106 mmol/L (ref 98–111)
Creatinine, Ser: 1.8 mg/dL — ABNORMAL HIGH (ref 0.44–1.00)
GFR, Estimated: 30 mL/min — ABNORMAL LOW (ref >60)
Glucose, Bld: 164 mg/dL — ABNORMAL HIGH (ref 70–99)
Potassium: 3.6 mmol/L (ref 3.5–5.1)
Sodium: 143 mmol/L (ref 135–145)
Total Bilirubin: 2.9 mg/dL — ABNORMAL HIGH (ref 0.0–1.2)
Total Protein: 4.9 g/dL — ABNORMAL LOW (ref 6.5–8.1)

## 2024-11-08 LAB — GLUCOSE, CAPILLARY
Glucose-Capillary: 132 mg/dL — ABNORMAL HIGH (ref 70–99)
Glucose-Capillary: 51 mg/dL — ABNORMAL LOW (ref 70–99)
Glucose-Capillary: 137 mg/dL — ABNORMAL HIGH (ref 70–99)
Glucose-Capillary: 137 mg/dL — ABNORMAL HIGH (ref 70–99)
Glucose-Capillary: 146 mg/dL — ABNORMAL HIGH (ref 70–99)
Glucose-Capillary: 136 mg/dL — ABNORMAL HIGH (ref 70–99)

## 2024-11-08 LAB — BASIC METABOLIC PANEL WITH GFR
Anion gap: 20 — ABNORMAL HIGH (ref 5–15)
BUN: 31 mg/dL — ABNORMAL HIGH (ref 8–23)
CO2: 17 mmol/L — ABNORMAL LOW (ref 22–32)
Calcium: 8.3 mg/dL — ABNORMAL LOW (ref 8.9–10.3)
Chloride: 107 mmol/L (ref 98–111)
Creatinine, Ser: 1.87 mg/dL — ABNORMAL HIGH (ref 0.44–1.00)
GFR, Estimated: 29 mL/min — ABNORMAL LOW (ref >60)
Glucose, Bld: 186 mg/dL — ABNORMAL HIGH (ref 70–99)
Potassium: 4.1 mmol/L (ref 3.5–5.1)
Sodium: 143 mmol/L (ref 135–145)

## 2024-11-08 LAB — HEMOGLOBIN AND HEMATOCRIT, BLOOD
HCT: 24.5 % — ABNORMAL LOW (ref 36.0–46.0)
HCT: 25.6 % — ABNORMAL LOW (ref 36.0–46.0)
HCT: 27.4 % — ABNORMAL LOW (ref 36.0–46.0)
Hemoglobin: 8.4 g/dL — ABNORMAL LOW (ref 12.0–15.0)
Hemoglobin: 8.7 g/dL — ABNORMAL LOW (ref 12.0–15.0)
Hemoglobin: 9.4 g/dL — ABNORMAL LOW (ref 12.0–15.0)

## 2024-11-08 LAB — MRSA NEXT GEN BY PCR, NASAL: MRSA by PCR Next Gen: NOT DETECTED

## 2024-11-08 LAB — AMMONIA: Ammonia: 243 umol/L — ABNORMAL HIGH (ref 9–35)

## 2024-11-08 LAB — PROTIME-INR
INR: 3.4 — ABNORMAL HIGH (ref 0.8–1.2)
Prothrombin Time: 36.1 s — ABNORMAL HIGH (ref 11.4–15.2)

## 2024-11-08 LAB — PROCALCITONIN: Procalcitonin: 5.34 ng/mL

## 2024-11-08 LAB — MAGNESIUM: Magnesium: 1.9 mg/dL (ref 1.7–2.4)

## 2024-11-08 LAB — LIPASE, BLOOD: Lipase: 60 U/L — ABNORMAL HIGH (ref 11–51)

## 2024-11-08 LAB — PREPARE RBC (CROSSMATCH)

## 2024-11-08 LAB — PHOSPHORUS: Phosphorus: 2.2 mg/dL — ABNORMAL LOW (ref 2.5–4.6)

## 2024-11-08 MED ORDER — ETOMIDATE 2 MG/ML IV SOLN
INTRAVENOUS | Status: AC
  Administered 2024-11-08: 05:00:00 2 mg
  Filled 2024-11-08: qty 20

## 2024-11-08 MED ORDER — MIDAZOLAM HCL 2 MG/2ML IJ SOLN
INTRAMUSCULAR | Status: AC
  Filled 2024-11-08: qty 2

## 2024-11-08 MED ORDER — MAGNESIUM SULFATE IN D5W 1-5 GM/100ML-% IV SOLN
1.0000 g | Freq: Once | INTRAVENOUS | Status: AC
  Administered 2024-11-08: 17:00:00 1 g via INTRAVENOUS
  Filled 2024-11-08: qty 100

## 2024-11-08 MED ORDER — MIDAZOLAM HCL (PF) 2 MG/2ML IJ SOLN
1.0000 mg | INTRAMUSCULAR | Status: DC | PRN
  Administered 2024-11-09: 12:00:00 2 mg via INTRAVENOUS
  Filled 2024-11-08: qty 2

## 2024-11-08 MED ORDER — SODIUM CHLORIDE 0.9 % IV SOLN
1.0000 mg | Freq: Every day | INTRAVENOUS | Status: AC
  Administered 2024-11-08 – 2024-11-10 (×3): 1 mg via INTRAVENOUS
  Filled 2024-11-08 (×3): qty 0.2

## 2024-11-08 MED ORDER — ROCURONIUM BROMIDE 10 MG/ML (PF) SYRINGE
PREFILLED_SYRINGE | INTRAVENOUS | Status: AC
  Filled 2024-11-08: qty 10

## 2024-11-08 MED ORDER — FENTANYL CITRATE (PF) 50 MCG/ML IJ SOSY
PREFILLED_SYRINGE | INTRAMUSCULAR | Status: AC
  Filled 2024-11-08: qty 2

## 2024-11-08 MED ORDER — NOREPINEPHRINE 4 MG/250ML-% IV SOLN
0.0000 ug/min | INTRAVENOUS | Status: DC
  Administered 2024-11-08: 06:00:00 18 ug/min via INTRAVENOUS
  Administered 2024-11-08: 7 ug/min via INTRAVENOUS
  Administered 2024-11-08: 13:00:00 8 ug/min via INTRAVENOUS
  Filled 2024-11-08 (×5): qty 250

## 2024-11-08 MED ORDER — FENTANYL CITRATE (PF) 50 MCG/ML IJ SOSY: 50.0000 ug | PREFILLED_SYRINGE | INTRAMUSCULAR | Status: DC | PRN

## 2024-11-08 MED ORDER — DEXTROSE 50 % IV SOLN
25.0000 g | Freq: Once | INTRAVENOUS | Status: AC
  Administered 2024-11-08: 05:00:00 25 g via INTRAVENOUS

## 2024-11-08 MED ORDER — SODIUM CHLORIDE 0.9% IV SOLUTION: Freq: Once | INTRAVENOUS | Status: AC

## 2024-11-08 MED ORDER — POTASSIUM PHOSPHATES 15 MMOLE/5ML IV SOLN
15.0000 mmol | Freq: Once | INTRAVENOUS | Status: DC
  Filled 2024-11-08: qty 5

## 2024-11-08 MED ORDER — ORAL CARE MOUTH RINSE: 15.0000 mL | OROMUCOSAL | Status: DC | PRN

## 2024-11-08 MED ORDER — LACTATED RINGERS IV SOLN: INTRAVENOUS | Status: DC

## 2024-11-08 MED ORDER — SODIUM CHLORIDE 0.9 % IV SOLN
2.0000 g | INTRAVENOUS | Status: DC
  Administered 2024-11-08 – 2024-11-11 (×4): 2 g via INTRAVENOUS
  Filled 2024-11-08 (×4): qty 20

## 2024-11-08 MED ORDER — LEVETIRACETAM (KEPPRA) 500 MG/5 ML ADULT IV PUSH
1500.0000 mg | Freq: Once | INTRAVENOUS | Status: AC
  Administered 2024-11-08: 06:00:00 1500 mg via INTRAVENOUS
  Filled 2024-11-08: qty 15

## 2024-11-08 MED ORDER — DEXTROSE 50 % IV SOLN
INTRAVENOUS | Status: AC
  Administered 2024-11-08: 05:00:00 50 mL
  Filled 2024-11-08: qty 50

## 2024-11-08 MED ORDER — PANTOPRAZOLE SODIUM 40 MG IV SOLR
80.0000 mg | Freq: Once | INTRAVENOUS | Status: AC
  Administered 2024-11-08: 07:00:00 80 mg via INTRAVENOUS
  Filled 2024-11-08: qty 20

## 2024-11-08 MED ORDER — SODIUM CHLORIDE 0.9 % IV SOLN
50.0000 ug/h | INTRAVENOUS | Status: DC
  Administered 2024-11-08 – 2024-11-11 (×7): 50 ug/h via INTRAVENOUS
  Filled 2024-11-08 (×12): qty 1

## 2024-11-08 MED ORDER — OCTREOTIDE LOAD VIA INFUSION
50.0000 ug | Freq: Once | INTRAVENOUS | Status: AC
  Administered 2024-11-08: 07:00:00 50 ug via INTRAVENOUS
  Filled 2024-11-08: qty 25

## 2024-11-08 MED ORDER — ORAL CARE MOUTH RINSE
15.0000 mL | OROMUCOSAL | Status: DC
  Administered 2024-11-08 – 2024-11-11 (×40): 15 mL via OROMUCOSAL

## 2024-11-08 MED ORDER — CHLORHEXIDINE GLUCONATE CLOTH 2 % EX PADS
6.0000 | MEDICATED_PAD | Freq: Every day | CUTANEOUS | Status: DC
  Administered 2024-11-08 – 2024-11-10 (×3): 6 via TOPICAL

## 2024-11-08 MED ORDER — PANTOPRAZOLE SODIUM 40 MG IV SOLR
40.0000 mg | Freq: Two times a day (BID) | INTRAVENOUS | Status: DC
  Administered 2024-11-08 – 2024-11-11 (×6): 40 mg via INTRAVENOUS
  Filled 2024-11-08 (×6): qty 10

## 2024-11-08 MED ORDER — K PHOS MONO-SOD PHOS DI & MONO 155-852-130 MG PO TABS
500.0000 mg | ORAL_TABLET | Freq: Once | ORAL | Status: DC
  Filled 2024-11-08: qty 2

## 2024-11-08 MED ORDER — POLYETHYLENE GLYCOL 3350 17 G PO PACK: 17.0000 g | PACK | Freq: Every day | ORAL | Status: DC

## 2024-11-08 MED ORDER — LEVETIRACETAM (KEPPRA) 500 MG/5 ML ADULT IV PUSH
500.0000 mg | Freq: Two times a day (BID) | INTRAVENOUS | Status: DC
  Administered 2024-11-08 – 2024-11-11 (×6): 500 mg via INTRAVENOUS
  Filled 2024-11-08 (×7): qty 5

## 2024-11-08 MED ORDER — SUCCINYLCHOLINE CHLORIDE 200 MG/10ML IV SOSY
PREFILLED_SYRINGE | INTRAVENOUS | Status: AC
  Filled 2024-11-08: qty 10

## 2024-11-08 MED ORDER — SODIUM CHLORIDE 0.9 % IV SOLN
250.0000 mL | INTRAVENOUS | Status: AC
  Administered 2024-11-08: 07:00:00 250 mL via INTRAVENOUS

## 2024-11-08 NOTE — Progress Notes (Addendum)
 PHARMACY CONSULT NOTE - FOLLOW UP  Pharmacy Consult for Electrolyte Monitoring and Replacement   Recent Labs: Potassium (mmol/L)  Date Value  11/08/2024 3.6  09/26/2014 4.0   Magnesium  (mg/dL)  Date Value  87/82/7974 1.9  12/17/2013 2.7 (H)   Calcium  (mg/dL)  Date Value  87/82/7974 7.6 (L)   Calcium , Total (mg/dL)  Date Value  88/95/7984 7.9 (L)   Albumin (g/dL)  Date Value  87/82/7974 2.5 (L)  04/10/2024 3.6 (L)  09/26/2014 3.2 (L)   Phosphorus (mg/dL)  Date Value  87/82/7974 2.2 (L)   Sodium (mmol/L)  Date Value  11/08/2024 143  04/10/2024 143  09/26/2014 136   Corrected Ca: 8.8 mg/dL  Assessment: 68 yo F w/ PMH of EtOH abuse, hepatic cirrhosis, esophageal varices, portal HTN, portal vein thrombosis, A-fib on Eliquis , colitis, sarcoidosis,  GI bleeding, iron  deficiency anemia, HTN, HLD, asthma and COPD,  dCHF, loop recorder in place presenting to [facility] ED from home on 11/04/24 for evaluation of left flank pain. Pharmacy is asked to follow and replace electrolytes while in CCU  Goal of Therapy:  Potassium 4.0 - 5.1 mmol/L Magnesium  2.0 - 2.4 mg/dL All Other Electrolytes WNL  Plan:  --1 gram IV magnesium  sulfate x 1 ---recheck electrolytes in am  Adriana JONETTA Bolster ,PharmD Clinical Pharmacist 11/08/2024 7:19 AM

## 2024-11-08 NOTE — Progress Notes (Signed)
 eLink Physician-Brief Progress Note Patient Name: KORALEE WEDEKING DOB: 1955-12-19 MRN: [id number]   Date of Service  11/08/2024  HPI/Events of Note  Alcoholic pancreatitis and liver failure patient who briefly coded on the regular floor and was intubated and transferred to the ICU for a higher level of care. Patient also with GI bleeding history.  eICU Interventions  New Patient Evaluation.        Jenniger Figiel U Marisa Hage 11/08/2024, 6:07 AM

## 2024-11-08 NOTE — Progress Notes (Addendum)
 Initial Nutrition Assessment  DOCUMENTATION CODES:   Obesity unspecified  INTERVENTION:   Once appropriate for tube feeds, recommend:  Vital HP @60ml /hr- Initiate at 32ml/hr and increase by 10ml/hr q 8 hours until goal rate is reached.   Free water flushes 30ml q4 hours to maintain tube patency   Regimen provides 1440kcal/day, 126g/day protein and 1318ml/day of free water.   Pt at high refeed risk; recommend monitor potassium, magnesium  and phosphorus labs daily until stable  Folic acid  1mg  IV daily   Thiamine  100mg  IV daily   B-complex with C daily via tube once able  MVI daily via tube once able   Recommend iron  supplementation once appropriate   Daily weights   NUTRITION DIAGNOSIS:   Inadequate oral intake related to inability to eat (pt sedated and ventilated) as evidenced by NPO status.  GOAL:   Provide needs based on ASPEN/SCCM guidelines  MONITOR:   Vent status, Labs, Weight trends, Skin, I & O's  REASON FOR ASSESSMENT:   Ventilator    ASSESSMENT:   68 y/o female with h/o alcohol abuse, alcoholic liver cirrhosis, esophageal varices, portal venous hypertension, portal vein thrombosis, cholelithiasis, A-fib on Eliquis , sarcoidosis, bicytopenia, COPD, CKD III, iron  deficiency anemia, hypothyroidism, HTN, HLD, asthma, dCHF, CAD s/p L heart cath (01/2024) and loop recorder who is admitted with acute on chronic pancreatitis, gastritis and possible GIB complicated by seizures.  RD unable to see pt today. Pt sedated and ventilated. OGT not placed secondary to GIB and pt with h/o varices. GI following for possible EGD. Pt with poor appetite and oral intake since admit and is now without adequate nutrition for 5 days. Will plan to initiate tube feeds once enteral access is able to be gained. Pt is at high refeed risk. Pt is at high risk for vitamin K deficiency secondary to cirrhosis. Per chart, pt is up ~10lbs since admit. Pt + 6.5L on her I & Os. RD will obtain exam  at follow up.   Medications reviewed and include: protonix , miralax , scopolamine , thiamine , ceftriaxone , folic acid , LRS @75ml /hr, levophed , octreotide    Labs reviewed: K 3.6 wnl, BUN 30(H), creat 1.80(H), P 2.2(L), Mg 1.9 wnl, lipase 60(H), tbili 2.9(H) Hgb 9.4(L), Hct 27.4(L) INR- 3.4(H) Iron  23(L), TIBC 402, ferritin 17- 07/2024 Cbgs- 137, 137, 132, 51 x 24 hrs   Patient is currently intubated on ventilator support MV: 6.8 L/min Temp (24hrs), Avg:97.8 F (36.6 C), Min:97 F (36.1 C), Max:98.7 F (37.1 C)  MAP >58mmHg   UOP- 0ml   NUTRITION - FOCUSED PHYSICAL EXAM: Unable to perform at this time   Diet Order:   Diet Order             Diet NPO time specified  Diet effective now                  EDUCATION NEEDS:   Not appropriate for education at this time  Skin:  Skin Assessment: Reviewed RN Assessment (ecchymosis)  Last BM:  12/17- type 2  Height:   Ht Readings from Last 1 Encounters:  11/04/24 5' 4 (1.626 m)    Weight:   Wt Readings from Last 1 Encounters:  11/07/24 87.3 kg    Ideal Body Weight:  54.5 kg  BMI:  Body mass index is 33.04 kg/m.  Estimated Nutritional Needs:   Kcal:  1483kcal/day  Protein:  110-125g/day  Fluid:  1.4-1.6L/day  Augustin Shams MS, RD, LDN If unable to be reached, please send secure chat to RD inpatient  available from 8:00a-4:00p daily

## 2024-11-08 NOTE — Progress Notes (Signed)
 Pt transported on transport vent to Ct and back to room with no issues  30 minutes spent with patient

## 2024-11-08 NOTE — Significant Event (Signed)
 Rapid Response Event Note   Reason for Call :  Seziure/Hypoglycemia   Initial Focused Assessment:  Patient with NRB on and Minimally responsive. Primary nurse at bedside pushing amp of dextrose  for a blood sugar of 51. Per primary nurse  Patient was seizing and given 2mg  of ativan  prior to rapid response being call.  Donati-Garmon NP at beside.  BP 40/20 HR 80 on monitor. Pulse check Code blue called Levophed  started  2 compressions given then patient lifted arms in the air. Patient still manually responsive so ED provider intubated patient and patient was transferred to ICU.       MD Notified: Laneta Boston NP Call [phone] Arrival [phone] End [id number]:[phone]  Lesley LOISE Shams, RN

## 2024-11-08 NOTE — Progress Notes (Signed)
 Critical care note:  Date of note: 11/08/2024  Subjective: The patient's blood pressure has been soft during the last hour however has been maintaining her MAP.  While having a bath she has a melanotic stool and then had decreased responsiveness barely responding to scrubs.  She has been asleep since she came back from MRI when she was on cooperative and therefore was not completed.  Stool Hemoccult was ordered.  She later on opened her eyes and was shaking her left leg and her eyes turned up and was grunting.  She was thought to have a seizure and was given 2 mg of IV Ativan  when she was actively seizing and had a nonrebreather placed.  Blood glucose was 51 and she was given an amp of D50.  Rapid response was called.  She became hypotensive and was thought to have no pulse.  CODE BLUE was called.  CPR was immediately started and after couple of compressions she moved her arms.  She was fairly obtunded.  Decision was made to intubate her.  Objective: Physical examination: Generally: Acutely ill elderly African-American female, unresponsive to painful stimuli. She was witnessed to be seizing with unresponsiveness. Vital signs: BP was 92/57 with temperature 97.4 and heart rate of 82 with respiratory to 20 and pulse currently of 98-100 % earlier.  She later became hypotensive with BP 70s over 50s. Head - atraumatic, normocephalic.  Pupils - equal, round and reactive to light and accommodation. Extraocular movements are intact. No scleral icterus.  Oropharynx - moist mucous membranes and tongue. No pharyngeal erythema or exudate.  Neck - supple. No JVD. Carotid pulses 2+ bilaterally. No carotid bruits. No palpable thyromegaly or lymphadenopathy. Cardiovascular - regular rate and rhythm. Normal S1 and S2. No murmurs, gallops or rubs.  Lungs - clear to auscultation bilaterally.  Abdomen - soft, distended and nontender. Positive bowel sounds. No palpable organomegaly or masses.  Extremities - no pitting  edema, clubbing or cyanosis.  Neuro -she was mentally pounded and unresponsive to painful stimuli. Skin - no rashes. Breast, pelvic and rectal - deferred.   Labs and notes were reviewed.  Assessment/plan: 1.  Seizure with associated hypotension and shock as well as acute respiratory failure and failure to protect airways. - The patient was initially felt to have no pulse and after 2 CPR compressions she was moving her hands. - She was placed on IV normal saline wide open. - She was started on IV Levophed . - She had RSI with IV etomidate  and vecuronium. - She was intubated by Dr. Cyrena with 7.5 F ET tube at 23 cm at the lip. - She was transferred to the ICU. - ICU team is aware and will assume the care.  Authorized and performed by: Madison Peaches, MD Total critical care time:   30     minutes. Due to a high probability of clinically significant, life-threatening deterioration, the patient required my highest level of preparedness to intervene emergently and I personally spent this critical care time directly and personally managing the patient.  This critical care time included obtaining a history, examining the patient, pulse oximetry, ordering and review of studies, arranging urgent treatment with development of management plan, evaluation of patient's response to treatment, frequent reassessment, and discussions with other providers. This critical care time was performed to assess and manage the high probability of imminent, life-threatening deterioration that could result in multiorgan failure.  It was exclusive of separately billable procedures and treating other patients and teaching time.

## 2024-11-08 NOTE — Progress Notes (Signed)
 Contacting MD and NP on call to come to bedside as patient had melanotic stool, is non responsive and now possibly having a seizure.

## 2024-11-08 NOTE — Progress Notes (Signed)
 Patient ID: Kathryn [name], female   DOB: 17-Aug-1956, 68 y.o.   MRN: [id number]     SURGICAL PROGRESS NOTE   Hospital Day(s): 4.   Interval History: Patient seen and examined.  Night events noted.  Patient had cardiac arrest and she had cardiopulmonary resuscitation.  He was unable to complete MRCP due to being cooperative during study.  Patient developed hemorrhagic shock, respiratory failure on mechanical elation.  Patient currently on vasopressors.  Hemoglobin of 5.5.   Vital signs in last 24 hours: [min-max] current  Temp:  [97 F (36.1 C)-98.7 F (37.1 C)] 98.3 F (36.8 C) (12/17 0723) Pulse Rate:  [59-90] 84 (12/17 0723) Resp:  [15-20] 15 (12/17 0723) BP: (92-172)/(57-99) 124/81 (12/17 0723) SpO2:  [98 %-100 %] 100 % (12/17 0723) FiO2 (%):  [50 %-100 %] 50 % (12/17 0636)     Height: 5' 4 (162.6 cm) Weight: 87.3 kg BMI (Calculated): 33.02   Physical Exam:  Constitutional: Critically ill, sedated, contamination Respiratory: On mechanical ventilation Cardiovascular: regular rate and sinus rhythm  Gastrointestinal: soft, and non-distended  Labs:     Latest Ref Rng & Units 11/08/2024    5:56 AM 11/07/2024   10:30 PM 11/07/2024    3:31 AM  CBC  WBC 4.0 - 10.5 K/uL 8.1   4.6   Hemoglobin 12.0 - 15.0 g/dL 5.5  7.1  6.0   Hematocrit 36.0 - 46.0 % 16.5  20.9  18.3   Platelets 150 - 400 K/uL 92   69       Latest Ref Rng & Units 11/08/2024    5:56 AM 11/07/2024    3:31 AM 11/06/2024    4:36 AM  CMP  Glucose 70 - 99 mg/dL 835  893  92   BUN 8 - 23 mg/dL 30  21  21    Creatinine 0.44 - 1.00 mg/dL 8.19  8.83  8.80   Sodium 135 - 145 mmol/L 143  141  141   Potassium 3.5 - 5.1 mmol/L 3.6  4.1  4.3   Chloride 98 - 111 mmol/L 106  106  104   CO2 22 - 32 mmol/L 16  24  25    Calcium  8.9 - 10.3 mg/dL 7.6  7.9  7.8   Total Protein 6.5 - 8.1 g/dL 4.9  5.8  6.4   Total Bilirubin 0.0 - 1.2 mg/dL 2.9  3.2  2.2   Alkaline Phos 38 - 126 U/L 150  236  176   AST 15 - 41 U/L 187  321  211    ALT 0 - 44 U/L 44  51  42     Imaging studies: No new pertinent imaging studies   Assessment/Plan:  68 y.o. 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   Pancreatitis - Most likely due to alcoholic pancreatitis.  Patient with chronic history of recurrent pancreatitis.  She continue drinking alcohol.  - Imaging from the gallbladder standpoint has been unchanged for years - Lipase today 60 -Clinical alteration most likely not due to pancreatitis -No surgical management indicated   Alcoholic cirrhosis - Acutely with further -INR increased from 1.5 to 2.7 to 3.4 -Ammonia level increasing trend -Bilirubin decreasing trend -Patient now Child Pugh C.  Abdominal paraparetic mortality of 82% - Cirrhosis complicated with history of varices.  Hemoglobin this morning was 5.5.  Concern of variceal bleeding.  GI consulted. -  No surgical management indicated - Continue medical management per ICU and GI  Lucas Petrin, MD

## 2024-11-08 NOTE — Progress Notes (Signed)
 Chaplain responded to page request by on duty nurse for urgent support for patients daughter and extended family. Daughter unable to stay in room. Chaplain shared and supported family and offered compassion and empathy for loved one. Asked nurse to page chaplain phone again if and whne daughter returns and needs care.   Kaydence Menard Chaplain Intern- [facility]

## 2024-11-08 NOTE — Consult Note (Signed)
 Consultation Note Date: 11/08/2024   Patient Name: Kathryn [name]  DOB: 1956/05/01  MRN: [id number]  Age / Sex: 68 y.o., female  PCP: Odell Chard, Edra GRADE, MD Referring Physician: Isaiah Scrivener, MD  Reason for Consultation: Establishing goals of care  HPI/Patient Profile: Kathryn [name] is a 68 y.o. female with medical history significant of alcohol abuse, alcoholic liver cirrhosis, esophageal varices, portal venous hypertension, portal vein thrombosis A-fib on Eliquis , colitis, sarcoidosis, bicytopenia, GI bleeding, iron  deficiency anemia, HTN, HLD, asthma and COPD,  dCHF, loop recorder in place per pt,  who presents with left flank pain, lower abdominal pain.   Patient states that her symptoms started last night, including left flank pain and left lower abdominal pain, nausea with few episodes of nonbilious nonbloody vomiting.  No diarrhea.  No fever or chills.  Her left flank pain and lower abdominal pain is constant, aching, moderate to severe, nonradiating, not aggravated or alleviated by any known factors.  Patient has mild dry cough, mild SOB, no chest pain.  Denies symptoms of UTI.  His last drink of vodka was yesterday evening.  He took Eliquis  yesterday afternoon.  Clinical Assessment and Goals of Care: Notes and labs reviewed.  Spoke with CCM in depth regarding patient's case.  They discuss updates that have been provided to the family.  Patient has a daughter who is next of kin, in addition to siblings.  Spoke with patient's sister Heinz and another sister Claudene. Claudine states they are sisters, but married brothers and so both had the same last name.  They discussed that she lives alone at baseline.  They state she tells them that she does not drink alcohol and they do not see her doing so.  They discuss that patient has a daughter.  They discuss the dynamics of their relationship.  They discussed  that daughter has 2 sons, 1 of which helps his grandmother.  They discussed that they are unaware of any H POA documents or other advanced directives.  They advise they are unaware of any limits to care that the patient would desire.  The sisters explained that patient's daughter was overwhelmed today with the updates provided and has left.  They advised they do not have a way to reach her. The sisters have been updated well and are able to articulate her status.  We discussed plans moving forward.  We discussed different scenarios.    SUMMARY OF RECOMMENDATIONS   PMT will continue to follow      Primary Diagnoses: Present on Admission:  Acute gallstone pancreatitis  Alcohol use disorder  Alcoholic cirrhosis of liver (HCC)  Hypertension  Alcoholic pancreatitis  Atrial fibrillation, chronic (HCC)  Portal vein thrombosis  Hypothyroidism  CKD (chronic kidney disease), stage IIIa  Hyperlipemia  Pancytopenia (HCC)  Chronic diastolic CHF (congestive heart failure) (HCC)  Pancreatic cyst  COPD (chronic obstructive pulmonary disease) (HCC)  Asthma  Myocardial injury   I have reviewed the medical record, interviewed the patient and family, and examined the patient. The  following aspects are pertinent.  Past Medical History:  Diagnosis Date   Alcoholic cirrhosis of liver (HCC)    Asthma    CHF (congestive heart failure) (HCC)    Chronic disease anemia    Esophageal varices (HCC)    GR I on EGD by Dr Jeri 09/2014   ETOH abuse    Hypertension    Hypothyroidism    Murmur    Portal hypertension (HCC)    Sarcoidosis    Social History   Socioeconomic History   Marital status: Divorced    Spouse name: Not on file   Number of children: Not on file   Years of education: Not on file   Highest education level: Not on file  Occupational History   Not on file  Tobacco Use   Smoking status: Never   Smokeless tobacco: Never  Vaping Use   Vaping status: Never Used  Substance and  Sexual Activity   Alcohol use: Yes    Alcohol/week: 15.0 standard drinks of alcohol    Types: 15 Shots of liquor per week   Drug use: No   Sexual activity: Not Currently  Other Topics Concern   Not on file  Social History Narrative   She lives with 2 grandsons   Social Drivers of Health   Tobacco Use: Low Risk (11/04/2024)   Patient History    Smoking Tobacco Use: Never    Smokeless Tobacco Use: Never    Passive Exposure: Not on file  Financial Resource Strain: Not on file  Food Insecurity: No Food Insecurity (11/04/2024)   Epic    Worried About Programme Researcher, Broadcasting/film/video in the Last Year: Never true    Ran Out of Food in the Last Year: Never true  Transportation Needs: Unmet Transportation Needs (11/04/2024)   Epic    Lack of Transportation (Medical): Yes    Lack of Transportation (Non-Medical): No  Physical Activity: Not on file  Stress: Not on file  Social Connections: Socially Isolated (11/04/2024)   Social Connection and Isolation Panel    Frequency of Communication with Friends and Family: More than three times a week    Frequency of Social Gatherings with Friends and Family: Three times a week    Attends Religious Services: Never    Active Member of Clubs or Organizations: No    Attends Banker Meetings: Never    Marital Status: Widowed  Depression (PHQ2-9): Low Risk (12/08/2023)   Depression (PHQ2-9)    PHQ-2 Score: 0  Alcohol Screen: Not on file  Housing: Low Risk (11/04/2024)   Epic    Unable to Pay for Housing in the Last Year: No    Number of Times Moved in the Last Year: 0    Homeless in the Last Year: No  Utilities: Not At Risk (11/04/2024)   Epic    Threatened with loss of utilities: No  Health Literacy: Not on file   Family History  Problem Relation Age of Onset   Hypertension Mother    Heart attack Mother    Aneurysm Father    COPD Father    Cerebral aneurysm Father    Hypertension Sister    Hypertension Brother    Scheduled Meds:   Chlorhexidine  Gluconate Cloth  6 each Topical QHS   levETIRAcetam   500 mg Intravenous Q12H   lidocaine   1 patch Transdermal Q24H   mouth rinse  15 mL Mouth Rinse Q2H   pantoprazole  (PROTONIX ) IV  40 mg Intravenous Q12H  polyethylene glycol  17 g Per Tube Daily   scopolamine   1 patch Transdermal Q72H   thiamine   100 mg Oral Daily   Or   thiamine   100 mg Intravenous Daily   Continuous Infusions:  sodium chloride  120 mL/hr at 11/08/24 0908   cefTRIAXone  (ROCEPHIN )  IV 2 g (11/08/24 1320)   folic acid  1 mg in sodium chloride  0.9 % 50 mL IVPB Stopped (11/08/24 1029)   lactated ringers  75 mL/hr at 11/08/24 1040   magnesium  sulfate bolus IVPB     norepinephrine  (LEVOPHED ) Adult infusion 8 mcg/min (11/08/24 1312)   octreotide  (SANDOSTATIN ) 500 mcg in sodium chloride  0.9 % 250 mL (2 mcg/mL) infusion 50 mcg/hr (11/08/24 1328)   PRN Meds:.albuterol , fentaNYL  (SUBLIMAZE ) injection, fentaNYL  (SUBLIMAZE ) injection, midazolam  PF, mouth rinse, prochlorperazine  Medications Prior to Admission:  Prior to Admission medications  Medication Sig Start Date End Date Taking? Authorizing Provider  albuterol  (VENTOLIN  HFA) 108 (90 Base) MCG/ACT inhaler Inhale 1-2 puffs into the lungs every 4 (four) hours as needed for wheezing or shortness of breath. 06/14/23  Yes [provider]  apixaban  (ELIQUIS ) 5 MG TABS tablet Take 1 tablet (5 mg total) by mouth 2 (two) times daily. 02/22/24  Yes Lenon Marien CROME, MD  ferrous sulfate  325 (65 FE) MG tablet Take 1 tablet (325 mg total) by mouth daily with breakfast. 08/03/24  Yes Djan, Drue DASEN, MD  folic acid  (FOLVITE ) 1 MG tablet Take 1 tablet (1 mg total) by mouth daily. 08/03/24  Yes Dorinda Drue DASEN, MD  levothyroxine  (SYNTHROID ) 112 MCG tablet 1 tablet in the morning on an empty stomach Orally Once a day for 90 days 04/26/24  Yes [provider]  Multiple Vitamin (MULTIVITAMIN) tablet Take 1 tablet by mouth every other day.   Yes [provider]   nadolol  (CORGARD ) 80 MG tablet Take by mouth daily. For 90 days   Yes [provider]  pantoprazole  (PROTONIX ) 40 MG tablet Take 1 tablet (40 mg total) by mouth daily. 08/03/24  Yes Djan, Drue DASEN, MD  senna-docusate (SENOKOT-S) 8.6-50 MG tablet Take 1 tablet by mouth at bedtime as needed for mild constipation. 08/02/24  Yes Dorinda Drue DASEN, MD  atorvastatin  (LIPITOR) 10 MG tablet Take 1 tablet (10 mg total) by mouth daily. Patient not taking: Reported on 07/27/2024 02/23/24   Lenon Marien CROME, MD  Blood Glucose Monitoring Suppl (BLOOD GLUCOSE MONITOR SYSTEM) w/Device KIT as directed as directed daily for 30 days 06/08/24   [provider]  thiamine  (VITAMIN B-1) 100 MG tablet Take 1 tablet (100 mg total) by mouth daily. 02/23/24   Lenon Marien CROME, MD   Allergies[1] Review of Systems  Unable to perform ROS   Physical Exam Constitutional:      Comments: Eyes closed  Pulmonary:     Comments: On ventilator Skin:    General: Skin is warm and dry.     Vital Signs: BP 136/79   Pulse 72   Temp (!) 97.5 F (36.4 C) (Oral)   Resp 17   Ht 5' 4 (1.626 m)   Wt 87.3 kg   SpO2 100%   BMI 33.04 kg/m  Pain Scale: CPOT POSS *See Group Information*: S-Acceptable,Sleep, easy to arouse Pain Score: 0-No pain   SpO2: SpO2: 100 % O2 Device:SpO2: 100 % O2 Flow Rate: .O2 Flow Rate (L/min): 2 L/min  IO: Intake/output summary:  Intake/Output Summary (Last 24 hours) at 11/08/2024 1647 Last data filed at 11/08/2024 1040 Gross per 24 hour  Intake 1600.84 ml  Output 0 ml  Net 1600.84 ml    LBM: Last BM Date : 11/08/24 Baseline Weight: Weight: 88 kg Most recent weight: Weight: 87.3 kg      Signed by: Camelia Lewis, NP   Please contact Palliative Medicine Team phone at [phone] for questions and concerns.  For individual provider: See Amion                 [1]  Allergies Allergen Reactions   Oxycodone -Acetaminophen     Emetrol     Other Reaction(s):  critical   Hydrocodone-Acetaminophen  Other (See Comments)    Reaction: pt can't take anything with tylenol  due to her liver disease.    Ibuprofen Other (See Comments)    Esophageal varices   Naproxen Other (See Comments)    Esophageal varices   Oseltamivir     Other Reaction(s): critical   Propoxyphene Other (See Comments)    Reaction: pt isn't sure, but knows she can't take it.    Shellfish Allergy Hives   Sodium Ferric Gluconate [Ferrous Gluconate]     IV IRON    Tamiflu  [Oseltamivir Phosphate] Other (See Comments)    Esophageal varices

## 2024-11-08 NOTE — Consult Note (Signed)
 NAME:  Kathryn [name], MRN:  [id number], DOB:  [birth year], LOS: 4 ADMISSION DATE:  11/04/2024, CONSULTATION DATE:  11/08/24 REFERRING MD:  Dr. Lawence, CHIEF COMPLAINT:  Left flank pain, lower abdominal pain   History of Present Illness:  68 yo F presenting to [facility] ED from home on 11/04/24 for evaluation of left flank pain.  History obtained per chart review as patient is unable to participate in interview at this time. Patient reported that constant, aching, moderate to severe left flank pain and left lower abdominal pain began overnight on 11/03/2024.  She also endorsed nausea with a few episodes of nonbloody vomiting, as well as a dry cough and mild shortness of breath.  She denied chest pain, urinary symptoms, diarrhea, fever or chills.  Her last drink of vodka was on 11/03/2024 and she has been taking her Eliquis  as prescribed.  Patient also at some point described having dark stools but does take iron  pills outpatient.  ED course: Upon arrival patient alert and responsive, in sinus bradycardia and hypertensive but otherwise stable vitals on room air.  Labs significant for mild AGMA, transaminitis, mildly elevated ammonia, leukopenia, chronic anemia and thrombocytopenia.  Imaging revealed pancreatitis, chronic cirrhosis & cholelithiasis.  TRH consulted for admission. Medications given: Morphine , Zofran , IV contrast Initial Vitals: 97.6, 18, 55, 168/91 and 100% on room air  Hospital course:  See significant Hospital events for more details. Overnight 12/16 to 11/08/2024 patient had an abrupt change in mentation and TRH coverage was called bedside.  Per attendings description, her eyes deviated upwards she was unresponsive rigid and posturing in her bilateral upper extremities with tremors in 1 leg.  She received 1 dose of Ativan  that did not stop this activity, the activity stopped on its own after a few minutes.  During the same event she had had a moderate episode of melena with correlating  hypotension.  There was concern the patient had lost her pulse and [facility] team was called with CPR initiated.  But patient quickly reached up and CPR stopped.  However due to persistent encephalopathy patient was intubated and placed on mechanical ventilatory support for airway protection and transferred to the ICU on vasopressors due to hypotension.  Most recent significant labs:  I, Jenita Ruth Rust-Chester, AGACNP-BC, personally viewed and interpreted this ECG. EKG Interpretation: Date: 11/04/2024, EKG Time: 11: 42, Rate: 56, Rhythm: SB, QRS Axis:  normal, Intervals: normal, ST/T Wave abnormalities: none, Narrative Interpretation: SB  (Labs/ Imaging personally reviewed) Chemistry: Na+:141, K+: 4.1, BUN/Cr.: 21/1.16, Serum CO2/ AG: 21/11, AlK phos: 236, albumin: 2.9, AST/ALT: 321/ 51, T.Bili: 3.2, lipase: 60 Hematology: WBC: 8.1, Hgb: 5.5, platelet: 92 Troponin: 19 > 21, BNP: 279, Lactic/ PCT: pending, COVID-19 & Influenza A/B: negative ABG: 7.34/ 28/427/15.1  CXR 11/08/24: no acute cardiopulmonary process CT abdomen and pelvis with contrast 11/04/2024:  Profound hepatic steatosis with nodular contours of the liver, suggestive of cirrhosis. There is sequela of portal hypertension including small volume ascites, multiple abdominal collaterals, and favored portal colopathy. 2. Cholelithiasis with similar circumferential wall prominence of the gallbladder compared to priors. This is favored to reflect sequela of underlying liver disease. If there is clinical concern for acute cholecystitis, recommend dedicated RIGHT upper quadrant ultrasound. 3. Nonspecific fat stranding throughout the epigastric area. This could reflect sequela of portal  hypertension, pancreatitis or sequela of duodenitis. Recommend correlation with lipase levels. 4. There is wall thickening of the stomach, duodenum and colon. This is nonspecific and could reflect a nonspecific enteritis or  sequela of liver disease. 5. There are  several small hypoenhancing areas along the pancreatic body and tail measuring approximately 5 mm. These are nonspecific and could reflect small pseudocysts or side branch IPMNs. Recommend further evaluation with nonemergent outpatient pancreatic protocol MRI with and without contrast. 6. There is heterogeneous expansion of the endometrial canal spanning approximately 12 mm. Recommend further evaluation with pelvic ultrasound when clinically appropriate. Aortic Atherosclerosis  US  abdomen limited RUQ 11/04/2024:  Gallstones in the neck of the gallbladder with borderline wall thickening and cholecystic fluid. Negative sonographic murphy sign. Correlate clinically for acute cholecystitis. 2. Nodular liver contour with increased, coarsened echotexture, compatible with cirrhosis; evaluation limited by increased echogenicity. 3. Small amount of free fluid in the right upper quadrant.   PCCM consulted for assistance in management and monitoring due to hypovolemic shock suspect secondary to acute blood loss anemia, acute hypoxic respiratory failure requiring intubation and mechanical ventilatory support & suspected new onset seizure activity.  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  Attempted MRCP but patient noncooperative and had to be aborted.  Overnight  mentation changed suddenly, suspected new onset seizure activity and circulatory shock requiring intubation and mechanical ventilatory support, vasopressors and transferred to ICU.  Interim History / Subjective:  Patient intubated and sedated unable to participate in interview at this time.  Family updated by TRH attending  Objective    Blood pressure (!) 116/99, pulse 90, temperature (!) 97.4 F (36.3 C), temperature source Axillary, resp. rate 20, height 5' 4 (1.626 m), weight 87.3 kg, SpO2 100%.        Intake/Output Summary (Last 24 hours) at 11/08/2024 0506 Last data filed at 11/07/2024 1539 Gross per 24 hour  Intake 935.25 ml  Output --  Net 935.25 ml   Filed Weights   11/05/24 0509 11/06/24 0430 11/07/24 0155  Weight: 81.6 kg 84.9 kg 87.3 kg    Examination: General: Adult female, critically ill, lying in bed intubated & sedated requiring mechanical ventilation, NAD HEENT: MM pink/moist, scleral icterus, atraumatic, neck supple Neuro: RASS -5, unable to follow commands, PERRL +3 CV: s1s2 RRR, NSR on monitor, no r/m/g Pulm: Regular, non labored on PRVC 100% and PEEP of 5, breath sounds clear-BUL & clear/diminished-BLL GI: soft, distended/rounded, bs x 4 Skin: Limited exam- no rashes/lesions noted Extremities: warm/dry, pulses + 2 R/P, trace edema noted BLE  Resolved problem list   Assessment and Plan  Acute Hypoxic Respiratory Failure  PMHx: COPD & asthma Ventilator settings: PRVC  8 mL/kg, 100% FiO2, 5 PEEP, continue ventilator support & lung protective strategies - Wean PEEP & FiO2 as tolerated, maintain SpO2 > 90% - Head of bed elevated 30 degrees, VAP protocol in place - Plateau pressures less than 30 cm H20  - Intermittent chest x-ray & ABG PRN - Daily WUA with SBT as tolerated  - Ensure adequate pulmonary hygiene  - bronchodilators PRN - PAD protocol in place: Fentanyl  IVP & Versed  IVP  Suspected New Onset Seizure Activity - loaded with Keppra ,  continue  BID dosing - EEG this AM - versed  IV PRN - Neurology consulted, appreciate input  Acute Gastrointestinal Bleed  Hemorrhagic Shock s/t ABLA Chronic thrombocytopenia - Eliquis  on hold - 2 units of pRBC's ordered & 1 unit of FFP - serial H&H - initiate Octreotide  bolus, followed by infusion - initiate Protonix  bolus followed by PPI BID - Maintain up to date type & screen - continuous cardiac monitoring - NPO - Monitor for s/s of bleeding - Daily CBC, PT/ INR monitoring PRN - Transfuse for Hgb <7 - Monitor coag panel - Consider transfusion of platelets if < 10 - continue Levophed  drip as needed, titrate to maintain MAP > 65 & SBP > 90 -Consider CT angio GI bleed once stabilized - GI consulted, appreciate input  Acute liver failure Chronic alcoholic cirrhosis Esophageal varices Portal hypertension and portal vein thrombosis Elevated INR - Trend hepatic function -1 unit of FFP ordered due to worsening INR and acute bleeding -Lactulose  currently on hold due to no p.o. access and acute bleeding per rectum - Eliquis  on hold due to GI bleeding - avoid hepatotoxic agents - GI consulted, general surgery following appreciate input  Acute alcoholic pancreatitis-improving Pancreatic cyst on imaging Gallstones noted and ultrasound - Continue IV fluids -Unable to obtain MRCP due to patient's inability to cooperate -General Surgery following appreciate input  EtOH abuse -Continue CIWA as able -Continue folic acid  and thiamine  -Versed  IV PRN  Chronic / Paroxysmal Atrial Fibrillation  Chronic HFpEF PMHx: HTN, HLD - Continuous cardiac monitoring - Eliquis  on hold due to acute bleeding - Antihypertensives on hold due to hypotension - Diurese with Lasix  PRN, as hemodynamics and renal function allow  CKD stage IIIa - Strict I/O's: alert provider if UOP < 0.5 mL/kg/hr - gentle IVF hydration  - Daily BMP, replace electrolytes PRN - Avoid nephrotoxic agents as able, ensure adequate  renal perfusion  Hypothyroidism -Restart outpatient levothyroxine  once able to tolerate p.o. medication, or after 5 days has passed can convert to IV form  Labs   CBC: Recent Labs  Lab 11/04/24 1426 11/05/24 0427 11/06/24 0436 11/07/24 0331 11/07/24 2230  WBC 2.7* 4.4 3.1* 4.6  --   HGB 9.1* 9.4* 7.9* 6.0* 7.1*  HCT 27.7* 29.2* 23.9* 18.3* 20.9*  MCV 93.3 95.1 93.7 92.9  --   PLT 56* 55* 44* 69*  --     Basic Metabolic Panel: Recent Labs  Lab 11/04/24 1208 11/04/24 1940 11/05/24 0745 11/06/24 0436 11/07/24 0331  NA 140  --  143 141 141  K 4.8  --  4.4 4.3 4.1  CL 103  --  104 104 106  CO2 21*  --  23 25 24   GLUCOSE 76  --  74 92 106*  BUN 15  --  17 21 21   CREATININE 1.06*  --  1.17* 1.19* 1.16*  CALCIUM  8.9  --  8.4* 7.8* 7.9*  MG  --  2.0  --   --   --   PHOS  --  3.7  --   --   --    GFR: Estimated Creatinine Clearance: 49.6 mL/min (A) (by C-G formula based on SCr of 1.16 mg/dL (H)). Recent Labs  Lab 11/04/24 1426 11/05/24 0427 11/06/24 0436 11/07/24 0331  WBC 2.7* 4.4 3.1* 4.6    Liver Function Tests: Recent Labs  Lab 11/04/24 1208 11/05/24 0745 11/06/24 0436 11/07/24 0331  AST 395* 269* 211* 321*  ALT 63* 52* 42 51*  ALKPHOS 239* 208* 176*  236*  BILITOT 1.6* 2.5* 2.2* 3.2*  PROT 7.7 7.2 6.4* 5.8*  ALBUMIN 3.5 3.5 3.0* 2.9*   Recent Labs  Lab 11/04/24 1208 11/04/24 1940  LIPASE 637* 434*   Recent Labs  Lab 11/04/24 1940  AMMONIA 43*    ABG    Component Value Date/Time   HCO3 12.4 (L) 07/27/2023 1549   ACIDBASEDEF 14.2 (H) 07/27/2023 1549   O2SAT 92.5 07/27/2023 1549     Coagulation Profile: Recent Labs  Lab 11/04/24 1940 11/07/24 1026  INR 1.5* 2.7*    Cardiac Enzymes: No results for input(s): CKTOTAL, CKMB, CKMBINDEX, TROPONINI in the last 168 hours.  HbA1C: Hgb A1c MFr Bld  Date/Time Value Ref Range Status  07/27/2023 11:05 AM 5.6 4.8 - 5.6 % Final    Comment:    (NOTE) Pre diabetes:           5.7%-6.4%  Diabetes:              >6.4%  Glycemic control for   <7.0% adults with diabetes   06/24/2021 04:30 PM 5.3 4.8 - 5.6 % Final    Comment:    (NOTE) Pre diabetes:          5.7%-6.4%  Diabetes:              >6.4%  Glycemic control for   <7.0% adults with diabetes     CBG: Recent Labs  Lab 11/05/24 0759 11/06/24 0820 11/07/24 0721 11/08/24 0449  GLUCAP 80 93 103* 51*    Review of Systems:   UTA- patient unresponsive on mechanical ventilation at this time  Past Medical History:  She,  has a past medical history of Alcoholic cirrhosis of liver (HCC), Asthma, CHF (congestive heart failure) (HCC), Chronic disease anemia, Esophageal varices (HCC), ETOH abuse, Hypertension, Hypothyroidism, Murmur, Portal hypertension (HCC), and Sarcoidosis.   Surgical History:   Past Surgical History:  Procedure Laterality Date   COLONOSCOPY N/A 07/29/2024   Procedure: COLONOSCOPY;  Surgeon: Maryruth Ole DASEN, MD;  Location: [facility] ENDOSCOPY;  Service: Endoscopy;  Laterality: N/A;   ESOPHAGOGASTRODUODENOSCOPY N/A 06/26/2021   Procedure: ESOPHAGOGASTRODUODENOSCOPY (EGD);  Surgeon: Toledo, Ladell POUR, MD;  Location: [facility] ENDOSCOPY;  Service: Gastroenterology;  Laterality: N/A;   ESOPHAGOGASTRODUODENOSCOPY N/A 07/29/2024   Procedure: EGD (ESOPHAGOGASTRODUODENOSCOPY);  Surgeon: Maryruth Ole DASEN, MD;  Location[facility] ENDOSCOPY;  Service: Endoscopy;  Laterality: N/A;   ESOPHAGOGASTRODUODENOSCOPY (EGD) WITH PROPOFOL  N/A 04/04/2015   Procedure: ESOPHAGOGASTRODUODENOSCOPY (EGD) WITH PROPOFOL ;  Surgeon: Rogelia Copping, MD;  Location: [facility] ENDOSCOPY;  Service: Endoscopy;  Laterality: N/A;  Dr COPPING prefers 1445   ESOPHAGOGASTRODUODENOSCOPY (EGD) WITH PROPOFOL  N/A 12/04/2022   Procedure: ESOPHAGOGASTRODUODENOSCOPY (EGD) WITH PROPOFOL ;  Surgeon: Therisa Bi, MD;  Location: [facility] ENDOSCOPY;  Service: Gastroenterology;  Laterality: N/A;   ESOPHAGOGASTRODUODENOSCOPY W/ BANDING  09/2014   Rein   LEFT HEART CATH  AND CORONARY ANGIOGRAPHY N/A 02/21/2024   Procedure: LEFT HEART CATH AND CORONARY ANGIOGRAPHY;  Surgeon: Mady Bruckner, MD;  Location: [facility] INVASIVE CV LAB;  Service: Cardiovascular;  Laterality: N/A;   TUBAL LIGATION       Social History:   reports that she has never smoked. She has never used smokeless tobacco. She reports current alcohol use of about 15.0 standard drinks of alcohol per week. She reports that she does not use drugs.   Family History:  Her family history includes Aneurysm in her father; COPD in her father; Cerebral aneurysm in her father; Heart attack in her mother; Hypertension in her brother, mother, and sister.  Allergies Allergies[1]   Home Medications  Prior to Admission medications  Medication Sig Start Date End Date Taking? Authorizing Provider  albuterol  (VENTOLIN  HFA) 108 (90 Base) MCG/ACT inhaler Inhale 1-2 puffs into the lungs every 4 (four) hours as needed for wheezing or shortness of breath. 06/14/23  Yes [provider]  apixaban  (ELIQUIS ) 5 MG TABS tablet Take 1 tablet (5 mg total) by mouth 2 (two) times daily. 02/22/24  Yes Lenon Marien CROME, MD  ferrous sulfate  325 (65 FE) MG tablet Take 1 tablet (325 mg total) by mouth daily with breakfast. 08/03/24  Yes Djan, Drue DASEN, MD  folic acid  (FOLVITE ) 1 MG tablet Take 1 tablet (1 mg total) by mouth daily. 08/03/24  Yes Dorinda Drue DASEN, MD  levothyroxine  (SYNTHROID ) 112 MCG tablet 1 tablet in the morning on an empty stomach Orally Once a day for 90 days 04/26/24  Yes [provider]  Multiple Vitamin (MULTIVITAMIN) tablet Take 1 tablet by mouth every other day.   Yes [provider]  nadolol  (CORGARD ) 80 MG tablet Take by mouth daily. For 90 days   Yes [provider]  pantoprazole  (PROTONIX ) 40 MG tablet Take 1 tablet (40 mg total) by mouth daily. 08/03/24  Yes Djan, Drue DASEN, MD  senna-docusate (SENOKOT-S) 8.6-50 MG tablet Take 1 tablet by mouth at bedtime as needed for mild  constipation. 08/02/24  Yes Dorinda Drue DASEN, MD  atorvastatin  (LIPITOR) 10 MG tablet Take 1 tablet (10 mg total) by mouth daily. Patient not taking: Reported on 07/27/2024 02/23/24   Lenon Marien CROME, MD  Blood Glucose Monitoring Suppl (BLOOD GLUCOSE MONITOR SYSTEM) w/Device KIT as directed as directed daily for 30 days 06/08/24   [provider]  thiamine  (VITAMIN B-1) 100 MG tablet Take 1 tablet (100 mg total) by mouth daily. 02/23/24   Lenon Marien CROME, MD     Critical care time: 70 minutes       Jenita Jama Meek, AGACNP-BC Acute Care Nurse Practitioner [facility]   [phone] / [phone] Please see Amion for details.      [1]  Allergies Allergen Reactions   Oxycodone -Acetaminophen     Emetrol     Other Reaction(s): critical   Hydrocodone-Acetaminophen  Other (See Comments)    Reaction: pt can't take anything with tylenol  due to her liver disease.    Ibuprofen Other (See Comments)    Esophageal varices   Naproxen Other (See Comments)    Esophageal varices   Oseltamivir     Other Reaction(s): critical   Propoxyphene Other (See Comments)    Reaction: pt isn't sure, but knows she can't take it.    Shellfish Allergy Hives   Sodium Ferric Gluconate [Ferrous Gluconate]     IV IRON    Tamiflu  [Oseltamivir Phosphate] Other (See Comments)    Esophageal varices

## 2024-11-08 NOTE — Procedures (Signed)
 Central Venous Catheter Insertion Procedure Note  Kathryn [name]  [id number]  21-Sep-1956  Date:11/08/2024  Time:5:53 AM   Provider Performing:Dawson Albers L Rust-Chester   Procedure: Insertion of Non-tunneled Central Venous Catheter(36556) with US  guidance (23062)   Indication(s) Medication administration  Consent Unable to obtain consent due to emergent nature of procedure.  Anesthesia Topical only with 1% lidocaine    Timeout Verified patient identification, verified procedure, site/side was marked, verified correct patient position, special equipment/implants available, medications/allergies/relevant history reviewed, required imaging and test results available.  Sterile Technique Maximal sterile technique including full sterile barrier drape, hand hygiene, sterile gown, sterile gloves, mask, hair covering, sterile ultrasound probe cover (if used).  Procedure Description Area of catheter insertion was cleaned with chlorhexidine  and draped in sterile fashion.  With real-time ultrasound guidance a central venous catheter was placed into the right internal jugular vein. Nonpulsatile blood flow and easy flushing noted in all ports.  The catheter was sutured in place and sterile dressing applied.  Complications/Tolerance None; patient tolerated the procedure well. Chest X-ray is ordered to verify placement for internal jugular or subclavian cannulation.   Chest x-ray is not ordered for femoral cannulation.  EBL Minimal  Specimen(s) None  Jenita Jama Meek, AGACNP-BC Acute Care Nurse Practitioner [facility]   [phone] / [phone] Please see Amion for details.

## 2024-11-08 NOTE — Progress Notes (Signed)
 Eeg done

## 2024-11-08 NOTE — Progress Notes (Signed)
°   11/08/24 0500  Spiritual Encounters  Type of Visit Initial  Care provided to: Pt not available  Conversation partners present during encounter Nurse  Referral source Code page  Reason for visit Code  OnCall Visit Yes   Chaplain responded to a Code Blue. Medical team worked on patient and eventually moved patient to ICU. There was no family present. Nurse said the NP was working on contacting family.

## 2024-11-08 NOTE — Progress Notes (Signed)
 Updated sister Rexene on patients condition and reported her transfer to the ICU to her.

## 2024-11-08 NOTE — Progress Notes (Signed)
 Per order ETT was pulled out to the 19cm mark.  ETT was moved to the patients anatomical right side of her mouth..  Vent check was completed [facility] at [facility]

## 2024-11-08 NOTE — Progress Notes (Signed)
 PT Cancellation Note  Patient Details Name: Kathryn [name] MRN: [id number] DOB: [birth year]   Cancelled Treatment:     Patient transferred to CCU. Will need new PT/OT order when/if appropriate to resume services.   Stepen Prins 11/08/2024, 8:03 AM

## 2024-11-08 NOTE — Consult Note (Signed)
 Rogelia Copping, MD [facility]  [address], [address] [place], [place] [address] Phone: [phone] Fax : [phone]  Consultation  Referring Provider:     Dr. Maggie Primary Care Physician:  Odell Chard, Edra GRADE, MD Primary Gastroenterologist: [facility]         Reason for Consultation:     GI bleed  Date of Admission:  11/04/2024 Date of Consultation:  11/08/2024         HPI:   Kathryn [name] is a 68 y.o. female who was admitted with pancreatitis and has been in the hospital since the 13th.  The patient had presented with left flank pain and lower abdominal pain with a history of significant alcohol abuse and alcoholic liver disease with esophageal varices found in the past.  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  She also has a history of GI bleeding iron  deficiency anemia hypertension hyperlipidemia COPD diastolic congestive heart failure and sarcoidosis. The patient has a past history of recurrent pancreatitis also and had her last vodka drink the night before admission.  The patient's liver enzymes had been slowly improving. Yesterday afternoon the patient was found to have hypotension and was started on pressors and while having a bath she had a melanotic stool and a decrease in responsiveness the patient was intubated and CPR was started.  The patient was then transferred to the ICU for further care. The patient's blood work of her hemoglobin hematocrit recently has shown:  Component     Latest Ref Rng 11/04/2024 11/05/2024 11/06/2024 11/07/2024 11/08/2024  Hemoglobin     12.0 - 15.0 g/dL 9.1 (L)  9.4 (L)  7.9 (L)  7.1 (L)  5.5 (L)   Hemoglobin         6.0 (L)    HCT     36.0 - 46.0 % 27.7 (L)  29.2 (L)  23.9 (L)  20.9 (L)  16.5 (L)   HCT         18.3 (L)       Past Medical History:  Diagnosis Date   Alcoholic cirrhosis of liver (HCC)    Asthma    CHF (congestive heart failure) (HCC)    Chronic disease anemia     Esophageal varices (HCC)    GR I on EGD by Dr Jeri 09/2014   ETOH abuse    Hypertension    Hypothyroidism    Murmur    Portal hypertension (HCC)    Sarcoidosis     Past Surgical History:  Procedure Laterality Date   COLONOSCOPY N/A 07/29/2024   Procedure: COLONOSCOPY;  Surgeon: Maryruth Ole DASEN, MD;  Location: [facility] ENDOSCOPY;  Service: Endoscopy;  Laterality: N/A;   ESOPHAGOGASTRODUODENOSCOPY N/A 06/26/2021   Procedure: ESOPHAGOGASTRODUODENOSCOPY (EGD);  Surgeon: Toledo, Ladell POUR, MD;  Location: [facility] ENDOSCOPY;  Service: Gastroenterology;  Laterality: N/A;   ESOPHAGOGASTRODUODENOSCOPY N/A 07/29/2024   Procedure: EGD (ESOPHAGOGASTRODUODENOSCOPY);  Surgeon: Maryruth Ole DASEN, MD;  Location: [facility] ENDOSCOPY;  Service: Endoscopy;  Laterality: N/A;   ESOPHAGOGASTRODUODENOSCOPY (EGD) WITH PROPOFOL  N/A 04/04/2015   Procedure: ESOPHAGOGASTRODUODENOSCOPY (EGD) WITH PROPOFOL ;  Surgeon: Rogelia Copping, MD;  Location: [facility] ENDOSCOPY;  Service: Endoscopy;  Laterality: N/A;  Dr COPPING prefers 1445   ESOPHAGOGASTRODUODENOSCOPY (EGD) WITH PROPOFOL  N/A 12/04/2022   Procedure: ESOPHAGOGASTRODUODENOSCOPY (EGD) WITH PROPOFOL ;  Surgeon: Therisa Bi, MD;  Location: [facility] ENDOSCOPY;  Service: Gastroenterology;  Laterality: N/A;   ESOPHAGOGASTRODUODENOSCOPY W/ BANDING  09/2014   Jeri  LEFT HEART CATH AND CORONARY ANGIOGRAPHY N/A 02/21/2024   Procedure: LEFT HEART CATH AND CORONARY ANGIOGRAPHY;  Surgeon: Mady Bruckner, MD;  Location: [facility] INVASIVE CV LAB;  Service: Cardiovascular;  Laterality: N/A;   TUBAL LIGATION      Prior to Admission medications  Medication Sig Start Date End Date Taking? Authorizing Provider  albuterol  (VENTOLIN  HFA) 108 (90 Base) MCG/ACT inhaler Inhale 1-2 puffs into the lungs every 4 (four) hours as needed for wheezing or shortness of breath. 06/14/23  Yes [provider]  apixaban  (ELIQUIS ) 5 MG TABS tablet Take 1 tablet (5 mg total) by mouth 2 (two) times daily. 02/22/24  Yes  Lenon Marien CROME, MD  ferrous sulfate  325 (65 FE) MG tablet Take 1 tablet (325 mg total) by mouth daily with breakfast. 08/03/24  Yes Djan, Drue DASEN, MD  folic acid  (FOLVITE ) 1 MG tablet Take 1 tablet (1 mg total) by mouth daily. 08/03/24  Yes Dorinda Drue DASEN, MD  levothyroxine  (SYNTHROID ) 112 MCG tablet 1 tablet in the morning on an empty stomach Orally Once a day for 90 days 04/26/24  Yes [provider]  Multiple Vitamin (MULTIVITAMIN) tablet Take 1 tablet by mouth every other day.   Yes [provider]  nadolol  (CORGARD ) 80 MG tablet Take by mouth daily. For 90 days   Yes [provider]  pantoprazole  (PROTONIX ) 40 MG tablet Take 1 tablet (40 mg total) by mouth daily. 08/03/24  Yes Djan, Drue DASEN, MD  senna-docusate (SENOKOT-S) 8.6-50 MG tablet Take 1 tablet by mouth at bedtime as needed for mild constipation. 08/02/24  Yes Dorinda Drue DASEN, MD  atorvastatin  (LIPITOR) 10 MG tablet Take 1 tablet (10 mg total) by mouth daily. Patient not taking: Reported on 07/27/2024 02/23/24   Lenon Marien CROME, MD  Blood Glucose Monitoring Suppl (BLOOD GLUCOSE MONITOR SYSTEM) w/Device KIT as directed as directed daily for 30 days 06/08/24   [provider]  thiamine  (VITAMIN B-1) 100 MG tablet Take 1 tablet (100 mg total) by mouth daily. 02/23/24   Lenon Marien CROME, MD    Family History  Problem Relation Age of Onset   Hypertension Mother    Heart attack Mother    Aneurysm Father    COPD Father    Cerebral aneurysm Father    Hypertension Sister    Hypertension Brother      Social History[1]  Allergies as of 11/04/2024 - Review Complete 11/04/2024  Allergen Reaction Noted   Oxycodone -acetaminophen   01/25/2018   Emetrol  11/04/2022   Hydrocodone-acetaminophen  Other (See Comments) 04/04/2015   Ibuprofen Other (See Comments) 04/04/2015   Naproxen Other (See Comments) 04/04/2015   Oseltamivir  11/04/2022   Propoxyphene Other (See Comments) 04/04/2015   Shellfish allergy  Hives 10/26/2019   Sodium ferric gluconate [ferrous gluconate]  08/01/2024   Tamiflu  [oseltamivir phosphate] Other (See Comments) 04/04/2015    Review of Systems:    All systems reviewed and negative except where noted in HPI.   Physical Exam:  Vital signs in last 24 hours: Temp:  [97 F (36.1 C)-98.7 F (37.1 C)] 97.7 F (36.5 C) (12/17 1111) Pulse Rate:  [59-90] 69 (12/17 1111) Resp:  [15-20] 17 (12/17 1111) BP: (72-172)/(46-99) 110/57 (12/17 1111) SpO2:  [98 %-100 %] 100 % (12/17 1111) FiO2 (%):  [50 %-100 %] 50 % (12/17 0735) Last BM Date : 11/08/24 General:   Intubated and not able to give any history Head:  Normocephalic and atraumatic. Eyes:   No icterus.  Conjunctiva pink. PERRLA. Ears:  Normal auditory acuity. Neck:  Supple; no masses or thyroidomegaly Lungs: Respirations even and unlabored. Lungs clear to auscultation bilaterally.   No wheezes, crackles, or rhonchi.  Heart:  Regular rate and rhythm;  Without murmur, clicks, rubs or gallops Abdomen:  Soft, nondistended, nontender. Normal bowel sounds. No appreciable masses or hepatomegaly.  No rebound or guarding.  Rectal:  Not performed. Msk:  Symmetrical without gross deformities.    Extremities:  Without edema, cyanosis or clubbing. Neurologic: Unable to assess Skin:  Intact without significant lesions or rashes. Cervical Nodes:  No significant cervical adenopathy. Psych:  Alert and cooperative. Normal affect.  LAB RESULTS: Recent Labs    11/06/24 0436 11/07/24 0331 11/07/24 2230 11/08/24 0556  WBC 3.1* 4.6  --  8.1  HGB 7.9* 6.0* 7.1* 5.5*  HCT 23.9* 18.3* 20.9* 16.5*  PLT 44* 69*  --  92*   BMET Recent Labs    11/06/24 0436 11/07/24 0331 11/08/24 0556  NA 141 141 143  K 4.3 4.1 3.6  CL 104 106 106  CO2 25 24 16*  GLUCOSE 92 106* 164*  BUN 21 21 30*  CREATININE 1.19* 1.16* 1.80*  CALCIUM  7.8* 7.9* 7.6*   LFT Recent Labs    11/07/24 1026 11/08/24 0556  PROT  --  4.9*  ALBUMIN  --   2.5*  AST  --  187*  ALT  --  44  ALKPHOS  --  150*  BILITOT  --  2.9*  BILIDIR 2.2*  --    PT/INR Recent Labs    11/07/24 1026 11/08/24 0522  LABPROT 29.7* 36.1*  INR 2.7* 3.4*    STUDIES: DG Chest Port 1 View Result Date: 11/08/2024 EXAM: 1 VIEW(S) XRAY OF THE CHEST 11/08/2024 06:08:00 AM COMPARISON: 11/04/2024 CLINICAL HISTORY: Encounter for intubation [id number]; [id number] Encounter for central line placement [id number] FINDINGS: LINES, TUBES AND DEVICES: Endotracheal tube in place with tip at carina. Right IJ central venous catheter in place with tip in upper right atrium. Cardiac leads and defibrillator pads noted. Cardiac loop recorder or ICD in left chest. LUNGS AND PLEURA: Low lung volumes. No focal pulmonary opacity. No pleural effusion. No pneumothorax. HEART AND MEDIASTINUM: No acute abnormality of the cardiac and mediastinal silhouettes. BONES AND SOFT TISSUES: No acute osseous abnormality. IMPRESSION: 1. Endotracheal tube tip at the carina; recommend retraction. 2. Right internal jugular central venous catheter tip in the upper right atrium. . The urgent finding will be called to the ordering provider by the Professional Radiology Assistants (PRAs) and documented in the [facility] dashboard. Electronically signed by: Waddell Calk MD 11/08/2024 06:28 AM EST RP Workstation: [id number]      Impression / Plan:   Assessment: Principal Problem:   Acute gallstone pancreatitis Active Problems:   Hypothyroidism   Hypertension   Asthma   CKD (chronic kidney disease), stage IIIa   Pancytopenia (HCC)   Portal vein thrombosis   Alcohol use disorder   Alcoholic cirrhosis of liver (HCC)   Hyperlipemia   Alcoholic pancreatitis   Atrial fibrillation, chronic (HCC)   Chronic diastolic CHF (congestive heart failure) (HCC)   Pancreatic cyst   COPD (chronic obstructive pulmonary disease) (HCC)   Myocardial injury   Shock circulatory (HCC)   Kathryn [name] is a 68 y.o. y/o female with  admission to the ICU after having a melanotic stool and becoming unresponsive with a drop in hemoglobin and hypotension.  The patient was then put on pressors and GI consult was  called.  I spoke to the patient's sisters who both were explained the implications of having black stools in this patient with known cirrhosis and known varices.  The patient's hemoglobin was 7.1 and today is 5.5.  Plan:  The patient will need to be stabilized with blood transfusions and hemoglobin corrected prior to moving forward with any endoscopic intervention at this time.  I have discussed this with the ICU team and if the hemoglobin can be brought up then we can plan to do the EGD tomorrow.  Thank you for involving me in the care of this patient.      LOS: 4 days   Rogelia Copping, MD, MD. NOLIA 11/08/2024, 12:10 PM,  Pager [phone] 7am-5pm  Check AMION for 5pm -7am coverage and on weekends   Note: This dictation was prepared with Dragon dictation along with smaller phrase technology. Any transcriptional errors that result from this process are unintentional.       [1]  Social History Tobacco Use   Smoking status: Never   Smokeless tobacco: Never  Vaping Use   Vaping status: Never Used  Substance Use Topics   Alcohol use: Yes    Alcohol/week: 15.0 standard drinks of alcohol    Types: 15 Shots of liquor per week   Drug use: No

## 2024-11-08 NOTE — Progress Notes (Signed)
 OT Cancellation Note  Patient Details Name: MURL ZOGG MRN: [id number] DOB: 10-Mar-1956   Cancelled Treatment:    Reason Eval/Treat Not Completed: Other (comment) (pt had a change in status, transferred to a higher level of care. Will need new OT orders when approrpiate.) Therisa Sheffield, OTD OTR/L  11/08/2024, 8:42 AM

## 2024-11-08 NOTE — Progress Notes (Signed)
 The patient's repeat hemoglobin has been elevated from her previous level.  The patient has been set up for an EGD for tomorrow.

## 2024-11-08 NOTE — ED Provider Notes (Signed)
°  Responded to CODE BLUE. Patient hypotensive when I arrived.  Started Levophed .  Bagging well by respiratory with 100% O2.  Awaiting improvement in blood pressure prior to intubation.  Somewhat responsive, groaning.  Not maintaining airway appropriately, proceed with intubation per primary doctor instructions.  RSI meds etomidate  and rocuronium  used.  Intubated without complication see procedure note.  PROCEDURES:   Procedure Name: Intubation Date/Time: 11/08/2024 5:14 AM  Performed by: Cyrena Mylar, MDPre-anesthesia Checklist: Patient identified and Patient being monitored Oxygen Delivery Method: Ambu bag Preoxygenation: Pre-oxygenation with 100% oxygen Induction Type: Rapid sequence Ventilation: Mask ventilation without difficulty Laryngoscope Size: Glidescope and 4 Grade View: Grade I Tube size: 7.5 mm Number of attempts: 1 Airway Equipment and Method: Video-laryngoscopy Placement Confirmation: Positive ETCO2, ETT inserted through vocal cords under direct vision and Breath sounds checked- equal and bilateral Secured at: 23 cm Tube secured with: ETT holder          Cyrena Mylar, MD 11/08/24 [phone]

## 2024-11-09 ENCOUNTER — Encounter: Admission: EM | Payer: Self-pay | Source: Home / Self Care | Attending: Internal Medicine

## 2024-11-09 ENCOUNTER — Encounter: Payer: Self-pay | Admitting: Cardiology

## 2024-11-09 ENCOUNTER — Inpatient Hospital Stay

## 2024-11-09 DIAGNOSIS — I130 Hypertensive heart and chronic kidney disease with heart failure and stage 1 through stage 4 chronic kidney disease, or unspecified chronic kidney disease: Secondary | ICD-10-CM

## 2024-11-09 DIAGNOSIS — K859 Acute pancreatitis without necrosis or infection, unspecified: Secondary | ICD-10-CM

## 2024-11-09 DIAGNOSIS — Z515 Encounter for palliative care: Secondary | ICD-10-CM

## 2024-11-09 DIAGNOSIS — J4489 Other specified chronic obstructive pulmonary disease: Secondary | ICD-10-CM

## 2024-11-09 DIAGNOSIS — G931 Anoxic brain damage, not elsewhere classified: Secondary | ICD-10-CM | POA: Diagnosis not present

## 2024-11-09 DIAGNOSIS — G9341 Metabolic encephalopathy: Secondary | ICD-10-CM

## 2024-11-09 DIAGNOSIS — E872 Acidosis, unspecified: Secondary | ICD-10-CM

## 2024-11-09 DIAGNOSIS — K7200 Acute and subacute hepatic failure without coma: Secondary | ICD-10-CM

## 2024-11-09 DIAGNOSIS — D696 Thrombocytopenia, unspecified: Secondary | ICD-10-CM

## 2024-11-09 DIAGNOSIS — N1831 Chronic kidney disease, stage 3a: Secondary | ICD-10-CM

## 2024-11-09 LAB — CBC
HCT: 23.5 % — ABNORMAL LOW (ref 36.0–46.0)
Hemoglobin: 8.1 g/dL — ABNORMAL LOW (ref 12.0–15.0)
MCH: 30.7 pg (ref 26.0–34.0)
MCHC: 34.5 g/dL (ref 30.0–36.0)
MCV: 89 fL (ref 80.0–100.0)
Platelets: 72 K/uL — ABNORMAL LOW (ref 150–400)
RBC: 2.64 MIL/uL — ABNORMAL LOW (ref 3.87–5.11)
RDW: 19 % — ABNORMAL HIGH (ref 11.5–15.5)
WBC: 8.2 K/uL (ref 4.0–10.5)
nRBC: 8.5 % — ABNORMAL HIGH (ref 0.0–0.2)

## 2024-11-09 LAB — BPAM FFP
Blood Product Expiration Date: 202512182359
ISSUE DATE / TIME: 202512170642
Unit Type and Rh: 6200

## 2024-11-09 LAB — CK: Total CK: 60 U/L (ref 38–234)

## 2024-11-09 LAB — RENAL FUNCTION PANEL
Albumin: 2.6 g/dL — ABNORMAL LOW (ref 3.5–5.0)
Anion gap: 16 — ABNORMAL HIGH (ref 5–15)
BUN: 37 mg/dL — ABNORMAL HIGH (ref 8–23)
CO2: 20 mmol/L — ABNORMAL LOW (ref 22–32)
Calcium: 7.6 mg/dL — ABNORMAL LOW (ref 8.9–10.3)
Chloride: 107 mmol/L (ref 98–111)
Creatinine, Ser: 1.82 mg/dL — ABNORMAL HIGH (ref 0.44–1.00)
GFR, Estimated: 30 mL/min — ABNORMAL LOW (ref >60)
Glucose, Bld: 140 mg/dL — ABNORMAL HIGH (ref 70–99)
Phosphorus: 2.1 mg/dL — ABNORMAL LOW (ref 2.5–4.6)
Potassium: 3.7 mmol/L (ref 3.5–5.1)
Sodium: 142 mmol/L (ref 135–145)

## 2024-11-09 LAB — GLUCOSE, CAPILLARY
Glucose-Capillary: 106 mg/dL — ABNORMAL HIGH (ref 70–99)
Glucose-Capillary: 116 mg/dL — ABNORMAL HIGH (ref 70–99)
Glucose-Capillary: 120 mg/dL — ABNORMAL HIGH (ref 70–99)
Glucose-Capillary: 132 mg/dL — ABNORMAL HIGH (ref 70–99)
Glucose-Capillary: 133 mg/dL — ABNORMAL HIGH (ref 70–99)
Glucose-Capillary: 135 mg/dL — ABNORMAL HIGH (ref 70–99)
Glucose-Capillary: 136 mg/dL — ABNORMAL HIGH (ref 70–99)

## 2024-11-09 LAB — HEMOGLOBIN AND HEMATOCRIT, BLOOD
HCT: 23.6 % — ABNORMAL LOW (ref 36.0–46.0)
HCT: 21.3 % — ABNORMAL LOW (ref 36.0–46.0)
Hemoglobin: 8.1 g/dL — ABNORMAL LOW (ref 12.0–15.0)
Hemoglobin: 7.3 g/dL — ABNORMAL LOW (ref 12.0–15.0)

## 2024-11-09 LAB — PROTIME-INR
INR: 2.2 — ABNORMAL HIGH (ref 0.8–1.2)
Prothrombin Time: 25.4 s — ABNORMAL HIGH (ref 11.4–15.2)

## 2024-11-09 LAB — PREPARE FRESH FROZEN PLASMA

## 2024-11-09 LAB — LACTIC ACID, PLASMA
Lactic Acid, Venous: 4.9 mmol/L (ref 0.5–1.9)
Lactic Acid, Venous: 5 mmol/L (ref 0.5–1.9)
Lactic Acid, Venous: 5.2 mmol/L (ref 0.5–1.9)
Lactic Acid, Venous: 6.1 mmol/L (ref 0.5–1.9)

## 2024-11-09 LAB — MAGNESIUM: Magnesium: 2.1 mg/dL (ref 1.7–2.4)

## 2024-11-09 SURGERY — EGD (ESOPHAGOGASTRODUODENOSCOPY)
Anesthesia: General

## 2024-11-09 MED ORDER — SODIUM CHLORIDE 0.9% IV SOLUTION: Freq: Once | INTRAVENOUS | Status: AC

## 2024-11-09 MED ORDER — PROPOFOL 1000 MG/100ML IV EMUL
0.0000 ug/kg/min | INTRAVENOUS | Status: DC
  Administered 2024-11-09: 23:00:00 25 ug/kg/min via INTRAVENOUS
  Administered 2024-11-09: 16:00:00 20 ug/kg/min via INTRAVENOUS
  Administered 2024-11-10: 15 ug/kg/min via INTRAVENOUS
  Administered 2024-11-10: 25 ug/kg/min via INTRAVENOUS
  Administered 2024-11-11: 15 ug/kg/min via INTRAVENOUS
  Filled 2024-11-09 (×5): qty 100

## 2024-11-09 MED ORDER — VASOPRESSIN 20 UNITS/100 ML INFUSION FOR SHOCK
0.0000 [IU]/min | INTRAVENOUS | Status: DC
  Administered 2024-11-09 – 2024-11-10 (×3): 0.03 [IU]/min via INTRAVENOUS
  Administered 2024-11-11: 0.02 [IU]/min via INTRAVENOUS
  Filled 2024-11-09 (×4): qty 100

## 2024-11-09 MED ORDER — LORAZEPAM 2 MG/ML IJ SOLN
2.0000 mg | INTRAMUSCULAR | Status: DC | PRN
  Administered 2024-11-09: 18:00:00 2 mg via INTRAVENOUS
  Filled 2024-11-09: qty 1

## 2024-11-09 MED ORDER — MIDAZOLAM HCL (PF) 2 MG/2ML IJ SOLN
1.0000 mg | INTRAMUSCULAR | Status: DC | PRN
  Administered 2024-11-09 (×3): 4 mg via INTRAVENOUS
  Filled 2024-11-09 (×3): qty 4

## 2024-11-09 MED ORDER — MIDAZOLAM HCL 2 MG/2ML IJ SOLN
INTRAMUSCULAR | Status: AC
  Administered 2024-11-09: 13:00:00 4 mg via INTRAVENOUS
  Filled 2024-11-09: qty 4

## 2024-11-09 MED ORDER — POTASSIUM PHOSPHATES 15 MMOLE/5ML IV SOLN
15.0000 mmol | Freq: Once | INTRAVENOUS | Status: AC
  Administered 2024-11-09: 16:00:00 15 mmol via INTRAVENOUS
  Filled 2024-11-09: qty 5

## 2024-11-09 MED ORDER — LACTATED RINGERS IV BOLUS
500.0000 mL | Freq: Once | INTRAVENOUS | Status: AC
  Administered 2024-11-09: 06:00:00 500 mL via INTRAVENOUS

## 2024-11-09 MED ORDER — NOREPINEPHRINE 16 MG/250ML-% IV SOLN
0.0000 ug/min | INTRAVENOUS | Status: DC
  Administered 2024-11-09: 16:00:00 10 ug/min via INTRAVENOUS
  Filled 2024-11-09: qty 250

## 2024-11-09 NOTE — Progress Notes (Signed)
 Daily Progress Note   Patient Name: Kathryn [name]       Date: 11/09/2024 DOB: 1956/01/20  Age: 68 y.o. MRN#: [id number] Attending Physician: Isaiah Scrivener, MD Primary Care Physician: Odell Chard, Edra GRADE, MD Admit Date: 11/04/2024  Reason for Consultation/Follow-up: Establishing goals of care  Subjective: Notes and labs reviewed.  MRI has been completed.  Plans in place for PMT and neurology to speak with family together.  Returned to bedside several times throughout the day, awaiting all family members to arrive.  Met with family in the [place] along with Dr. Zollie.  24 family members were present including patient's daughter Annabella who would be surrogate decision maker.  We discussed her status.  We discussed her imaging, testing, and terminal prognosis.  Various questions answered.  Support provided to family in [place].  Daughter who is understandably very upset stepped out of room to go see her mother.  Upon returning to room patient was actively seizing with neurology and nursing at bedside; this was treated immediately.  MRI reviewed with daughter at bedside by Dr. Zollie.  Questions answered to family members individually both in the [place] and at bedside.  Discussed pain and suffering.  Discussed quality of life.  Discussed her level of independence. Daughter is processing.  At this time, continue current care.  Case discussed with CCM.  Length of Stay: 5  Current Medications: Scheduled Meds:   Chlorhexidine  Gluconate Cloth  6 each Topical QHS   levETIRAcetam   500 mg Intravenous Q12H   lidocaine   1 patch Transdermal Q24H   mouth rinse  15 mL Mouth Rinse Q2H   pantoprazole  (PROTONIX ) IV  40 mg Intravenous Q12H   polyethylene glycol  17 g Per Tube Daily   scopolamine   1 patch  Transdermal Q72H   thiamine   100 mg Oral Daily   Or   thiamine   100 mg Intravenous Daily    Continuous Infusions:  cefTRIAXone  (ROCEPHIN )  IV Stopped (11/09/24 1237)   folic acid  1 mg in sodium chloride  0.9 % 50 mL IVPB Stopped (11/09/24 1205)   lactated ringers  Stopped (11/09/24 1601)   norepinephrine  (LEVOPHED ) Adult infusion 10 mcg/min (11/09/24 1616)   octreotide  (SANDOSTATIN ) 500 mcg in sodium chloride  0.9 % 250 mL (2 mcg/mL) infusion 50 mcg/hr (11/09/24 1611)   potassium PHOSPHATE  IVPB (in mmol)  43 mL/hr at 11/09/24 1611   propofol  (DIPRIVAN ) infusion 20 mcg/kg/min (11/09/24 1611)    PRN Meds: albuterol , fentaNYL  (SUBLIMAZE ) injection, fentaNYL  (SUBLIMAZE ) injection, LORazepam , midazolam  PF, mouth rinse, prochlorperazine   Physical Exam Constitutional:      Comments: Eyes closed  Pulmonary:     Comments: On ventilator            Vital Signs: BP (!) 65/40   Pulse 74   Temp 97.7 F (36.5 C) (Oral)   Resp (!) 26   Ht 5' 4 (1.626 m)   Wt 89.2 kg   SpO2 100% Comment: Simultaneous filing. User may not have seen previous data.  BMI 33.76 kg/m  SpO2: SpO2: 100 % (Simultaneous filing. User may not have seen previous data.) O2 Device: O2 Device: Ventilator O2 Flow Rate: O2 Flow Rate (L/min): 2 L/min  Intake/output summary:  Intake/Output Summary (Last 24 hours) at 11/09/2024 1617 Last data filed at 11/09/2024 1611 Gross per 24 hour  Intake 4654.52 ml  Output 257 ml  Net 4397.52 ml   LBM: Last BM Date : 11/09/24 Baseline Weight: Weight: 88 kg Most recent weight: Weight: 89.2 kg   Patient Active Problem List   Diagnosis Date Noted   Shock circulatory (HCC) 11/08/2024   Melena 11/08/2024   Acute gallstone pancreatitis 11/04/2024   Alcoholic pancreatitis 11/04/2024   Atrial fibrillation, chronic (HCC) 11/04/2024   Chronic diastolic CHF (congestive heart failure) (HCC) 11/04/2024   Pancreatic cyst 11/04/2024   COPD (chronic obstructive pulmonary disease) (HCC)  11/04/2024   Myocardial injury 11/04/2024   Cough with hemoptysis 07/27/2024   Hyperlipemia 07/27/2024   AKI (acute kidney injury) 02/22/2024   Alcohol use 02/22/2024   Demand ischemia (HCC) 02/21/2024   Atrial fibrillation with rapid ventricular response (HCC) 02/18/2024   NSTEMI (non-ST elevated myocardial infarction) (HCC) 02/18/2024   Alcoholic cirrhosis of liver (HCC)    Nausea vomiting and diarrhea 07/28/2023   Nausea and vomiting 07/28/2023   Colitis 07/27/2023   High anion gap metabolic acidosis 07/27/2023   Alcohol use disorder 12/02/2022   Sarcoid 12/02/2022   Hypokalemia 01/05/2022   Hypomagnesemia 01/05/2022   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   Physical deconditioning 01/04/2022   Cholelithiasis without cholecystitis 01/03/2022   Thrombocytopenia 01/03/2022   Aortic atherosclerosis    Acute pancreatitis 01/02/2022   Symptomatic anemia 06/24/2021   Hypertensive urgency 06/24/2021   Asthma 06/24/2021   CKD (chronic kidney disease), stage IIIa 06/24/2021   Chronic CHF (HCC) 06/24/2021   Pancytopenia (HCC) 06/24/2021   Community acquired pneumonia 11/20/2019   Pneumonia due to COVID-19 virus 11/20/2019   Anemia 06/12/2016   Hypoglycemia 02/11/2016   Hematemesis 04/04/2015   Cirrhosis (HCC) 04/04/2015   Esophageal varices (HCC) 04/04/2015   Hypothyroidism 04/04/2015   Hypertension 04/04/2015   Chest pain 04/04/2015    Palliative Care Assessment & Plan   Recommendations/Plan:  Continue current care at this time.  Code Status:    Code Status Orders  (From admission, onward)           Start     Ordered   11/04/24 1929  Full code  Continuous       Question:  By:  Answer:  Consent: discussion documented in EHR   11/04/24 1929           Code Status History     Date Active Date Inactive Code Status Order ID Comments User Context   07/27/2024 1323 08/02/2024 1931 Full Code [id number]  Cox, Greig SAILOR, DO ED  02/18/2024 0528 02/22/2024 1816 Full Code  [id number]  Cleatus Delayne GAILS, MD ED   07/27/2023 1610 07/28/2023 2338 Full Code [id number]  Eldonna Elspeth PARAS, MD ED   12/02/2022 1255 12/05/2022 2044 Full Code [id number]  Cox, Amy N, DO ED   01/02/2022 2254 01/07/2022 2255 Full Code [id number]  Mansy, Madison LABOR, MD ED   06/24/2021 1647 06/27/2021 2149 Full Code [id number]  Hilma Rankins, MD ED   11/20/2019 1654 11/23/2019 1928 Full Code [id number]  Maree Hue, MD ED   06/12/2016 1028 06/15/2016 1440 Full Code [id number]  Tobie Press, MD ED   02/11/2016 1508 02/13/2016 2214 Full Code [id number]  Josette Ade, MD ED   04/04/2015 0548 04/06/2015 1751 Full Code [id number]  Jenel Lenis, MD Inpatient      Camelia Lewis, NP  Please contact Palliative Medicine Team phone at [phone] for questions and concerns.

## 2024-11-09 NOTE — Progress Notes (Signed)
 Unable to contact patient to schedule 6 month f/u. Recall deleted

## 2024-11-09 NOTE — Progress Notes (Signed)
 Pt transported back to ICU rm 11 and placed on the servo I on the documented settings no issues or concerns during transport

## 2024-11-09 NOTE — Progress Notes (Signed)
 PHARMACY CONSULT NOTE - FOLLOW UP  Pharmacy Consult for Electrolyte Monitoring and Replacement   Recent Labs: Potassium (mmol/L)  Date Value  11/09/2024 3.7  09/26/2014 4.0   Magnesium  (mg/dL)  Date Value  87/81/7974 2.1  12/17/2013 2.7 (H)   Calcium  (mg/dL)  Date Value  87/81/7974 7.6 (L)   Calcium , Total (mg/dL)  Date Value  88/95/7984 7.9 (L)   Albumin (g/dL)  Date Value  87/81/7974 2.6 (L)  04/10/2024 3.6 (L)  09/26/2014 3.2 (L)   Phosphorus (mg/dL)  Date Value  87/81/7974 2.1 (L)   Sodium (mmol/L)  Date Value  11/09/2024 142  04/10/2024 143  09/26/2014 136   Corrected Ca: 8.8 mg/dL  Assessment: 68 yo F w/ PMH of EtOH abuse, hepatic cirrhosis, esophageal varices, portal HTN, portal vein thrombosis, A-fib on Eliquis , colitis, sarcoidosis,  GI bleeding, iron  deficiency anemia, HTN, HLD, asthma and COPD,  dCHF, loop recorder in place presenting to [facility] ED from home on 11/04/24 for evaluation of left flank pain. Pharmacy is asked to follow and replace electrolytes while in CCU  MIVF: lactated ringers  at 100 ,L/hr  Goal of Therapy:  Potassium 4.0 - 5.1 mmol/L Magnesium  2.0 - 2.4 mg/dL All Other Electrolytes WNL  Plan:  --15 mmol IV potassium phosphate  x 1 (contains 22 mEq IV potasssium) ---recheck electrolytes in am  Kathryn [name] ,PharmD Clinical Pharmacist 11/09/2024 7:26 AM

## 2024-11-09 NOTE — Plan of Care (Signed)
°  Problem: Education: Goal: Knowledge of General Education information will improve Description: Including pain rating scale, medication(s)/side effects and non-pharmacologic comfort measures Outcome: Not Progressing   Problem: Health Behavior/Discharge Planning: Goal: Ability to manage health-related needs will improve Outcome: Not Progressing   Problem: Clinical Measurements: Goal: Ability to maintain clinical measurements within normal limits will improve Outcome: Not Progressing Goal: Diagnostic test results will improve Outcome: Not Progressing Goal: Respiratory complications will improve Outcome: Not Progressing   Problem: Activity: Goal: Risk for activity intolerance will decrease Outcome: Not Progressing   Problem: Nutrition: Goal: Adequate nutrition will be maintained Outcome: Not Progressing   Problem: Elimination: Goal: Will not experience complications related to bowel motility Outcome: Not Progressing

## 2024-11-09 NOTE — Progress Notes (Signed)
 At approx 0900 pt was placed on the transport ventilator and taken to MRI  there were no issues on the trip to MRI  45 min spent on first transport

## 2024-11-09 NOTE — Consult Note (Signed)
 NEUROLOGY CONSULT NOTE   Date of service: 11/08/24 Patient Name: Kathryn [name] MRN:  [id number] DOB:  12/13/1955 Chief Complaint: seizure Requesting Provider: Isaiah Scrivener, MD  History of Present Illness   Kathryn [name] is a 68 y.o. female with hx of alcohol abuse complicated by liver cirrhosis, esophageal varices, portal vein hypertension and portal vein thrombosis, A-fib on Eliquis , diastolic heart failure, hypertension, hyperlipidemia, sarcoidosis, iron  deficiency anemia who presented with left flank pain and was found to have acute pancreatitis on November 04, 2024.  This was thought to be secondary to alcohol use. Over the next few days she developed worsening liver failure Child-Pugh C, score 11 (mortality 82%). MRCP attempted but had to be aborted bc patient was noncooperative. Overnight mental status changed suddenly, then passed a large amount of melena and had what appeared to be a seizure. She was briefly thought to have lost pulses and started chest compressions but extended her arm during first round of CPR and they were able to find pulses after that.  She was found to be in circulatory shock requiring intubation and mechanical ventilator support, vasopressors and was transferred to the ICU. Family updated by CCM earlier this afternoon but no family is at bedside during my exam. Palliative care has also been consulted. She is not on sedation.    ROS   UTA 2/2 comatose  Past History   Past Medical History:  Diagnosis Date   Alcoholic cirrhosis of liver (HCC)    Asthma    CHF (congestive heart failure) (HCC)    Chronic disease anemia    Esophageal varices (HCC)    GR I on EGD by Dr Jeri 09/2014   ETOH abuse    Hypertension    Hypothyroidism    Murmur    Portal hypertension (HCC)    Sarcoidosis     Past Surgical History:  Procedure Laterality Date   COLONOSCOPY N/A 07/29/2024   Procedure: COLONOSCOPY;  Surgeon: Maryruth Ole DASEN, MD;  Location: [facility] ENDOSCOPY;  Service:  Endoscopy;  Laterality: N/A;   ESOPHAGOGASTRODUODENOSCOPY N/A 06/26/2021   Procedure: ESOPHAGOGASTRODUODENOSCOPY (EGD);  Surgeon: Toledo, Ladell POUR, MD;  Location: [facility] ENDOSCOPY;  Service: Gastroenterology;  Laterality: N/A;   ESOPHAGOGASTRODUODENOSCOPY N/A 07/29/2024   Procedure: EGD (ESOPHAGOGASTRODUODENOSCOPY);  Surgeon: Maryruth Ole DASEN, MD;  Location: [facility] ENDOSCOPY;  Service: Endoscopy;  Laterality: N/A;   ESOPHAGOGASTRODUODENOSCOPY (EGD) WITH PROPOFOL  N/A 04/04/2015   Procedure: ESOPHAGOGASTRODUODENOSCOPY (EGD) WITH PROPOFOL ;  Surgeon: Rogelia Copping, MD;  Location: [facility] ENDOSCOPY;  Service: Endoscopy;  Laterality: N/A;  Dr COPPING prefers 1445   ESOPHAGOGASTRODUODENOSCOPY (EGD) WITH PROPOFOL  N/A 12/04/2022   Procedure: ESOPHAGOGASTRODUODENOSCOPY (EGD) WITH PROPOFOL ;  Surgeon: Therisa Bi, MD;  Location: [facility] ENDOSCOPY;  Service: Gastroenterology;  Laterality: N/A;   ESOPHAGOGASTRODUODENOSCOPY W/ BANDING  09/2014   Rein   LEFT HEART CATH AND CORONARY ANGIOGRAPHY N/A 02/21/2024   Procedure: LEFT HEART CATH AND CORONARY ANGIOGRAPHY;  Surgeon: Mady Bruckner, MD;  Location: [facility] INVASIVE CV LAB;  Service: Cardiovascular;  Laterality: N/A;   TUBAL LIGATION      Family History: Family History  Problem Relation Age of Onset   Hypertension Mother    Heart attack Mother    Aneurysm Father    COPD Father    Cerebral aneurysm Father    Hypertension Sister    Hypertension Brother     Social History  reports that she has never smoked. She has never used smokeless tobacco. She reports current alcohol use of about 15.0 standard drinks of alcohol  per week. She reports that she does not use drugs.  Allergies[1]  Medications  Current Medications[2]  Vitals   Vitals:   11/09/24 0530 11/09/24 0545 11/09/24 0600 11/09/24 0615  BP: 101/60 (!) 97/59 (!) 98/59 (!) 98/59  Pulse: 68 66 65 65  Resp: 19 19 19 19   Temp:      TempSrc:      SpO2: 100% 100% 100% 100%  Weight:      Height:         Body mass index is 33.76 kg/m.   Physical Exam   Gen: patient lying in bed, comatose, not on sedation CV: extremities appear well-perfused Resp: ventilated  Neurologic exam MS: comatose, not on sedation Speech: intubated CN: 3mm sluggish but reactive bilaterally, (+) corneals, cough (-) oculocephalics, gagt Motor & sensory: LUE decorticate posturing in response to distal noxious stimuli or deep sternal rub, no response to noxious stimuli in any other extremity  Labs/Imaging/Neurodiagnostic studies   CBC:  Recent Labs  Lab November 26, 2024 0556 Nov 26, 2024 1335 11/09/24 0414 11/09/24 0506  WBC 8.1  --  8.2  --   HGB 5.5*   < > 8.1* 8.1*  HCT 16.5*   < > 23.5* 23.6*  MCV 91.7  --  89.0  --   PLT 92*  --  72*  --    < > = values in this interval not displayed.   Basic Metabolic Panel:  Lab Results  Component Value Date   NA 142 11/09/2024   K 3.7 11/09/2024   CO2 20 (L) 11/09/2024   GLUCOSE 140 (H) 11/09/2024   BUN 37 (H) 11/09/2024   CREATININE 1.82 (H) 11/09/2024   CALCIUM  7.6 (L) 11/09/2024   GFRNONAA 30 (L) 11/09/2024   GFRAA 48 (L) 04/14/2020   Lipid Panel:  Lab Results  Component Value Date   LDLCALC 53 04/10/2024   HgbA1c:  Lab Results  Component Value Date   HGBA1C 5.6 07/27/2023   Urine Drug Screen:     Component Value Date/Time   LABOPIA NONE DETECTED 04/06/2022 2054   COCAINSCRNUR NONE DETECTED 04/06/2022 2054   LABBENZ NONE DETECTED 04/06/2022 2054   AMPHETMU NONE DETECTED 04/06/2022 2054   THCU NONE DETECTED 04/06/2022 2054   LABBARB NONE DETECTED 04/06/2022 2054    Alcohol Level     Component Value Date/Time   ETH 219 (H) 02/17/2024 2331   INR  Lab Results  Component Value Date   INR 2.2 (H) 11/09/2024   APTT  Lab Results  Component Value Date   APTT >200 (HH) 11/05/2024   AED levels: No results found for: PHENYTOIN, ZONISAMIDE, LAMOTRIGINE, LEVETIRACETA  CT Head without contrast(Personally reviewed): No acute process  MRI  Brain(Personally reviewed): Ordered but not yet performed  Neurodiagnostics rEEG:  While comatose and off sedation, diffuse suppression, minimal runs of what appears to be beta activity lasting <1 sec  ASSESSMENT   Kathryn [name] is a 68 y.o. female with hx of alcohol abuse complicated by liver cirrhosis, esophageal varices, portal vein hypertension and portal vein thrombosis, A-fib on Eliquis , diastolic heart failure, hypertension, hyperlipidemia, sarcoidosis, iron  deficiency anemia who presented with left flank pain and was found to have acute pancreatitis on November 04, 2024.  This was thought to be secondary to alcohol use. Over the next few days she developed worsening liver failure Child-Pugh C, score 11 (mortality 82%).   Overnight mental status changed suddenly, then passed a large amount of melena and had what appeared to be a seizure.  She was briefly thought to have lost pulses and started chest compressions but extended her arm during first round of CPR and they were able to find pulses after that.  She was found to be in circulatory shock requiring intubation and mechanical ventilator support, vasopressors and was transferred to the ICU.   She is comatose despite no sedation and has an extremely poor neurologic exam and also an EEG that is almost completely suppressed. She has sluggish pupils, some brainstem reflexes, no response to noxious stimuli (deep sternal rub or in any extremity) except for decorticate posturing in the LUE. I am concerned about structural brain damage (stroke or other) causing her exam to be so poor. In absence of ongoing seizures (and there were none on spot EEG today) there is no other explanation for her exam findings off sedation. In the absence of sedation I likewise do not have an explanation as to why her EEG is so suppressed. Hopefully further imaging will shed some light.   RECOMMENDATIONS   - MRI brain wo contrast - Continue keppra  500mg  bid - Will  continue to follow. If family is available to meet tomorrow will coordinate with palliative to do so  This patient is critically ill and at significant risk of neurological worsening, death and care requires constant monitoring of vital signs, hemodynamics,respiratory and cardiac monitoring, neurological assessment, discussion with family, other specialists and medical decision making of high complexity. I spent 55 minutes of neurocritical care time  in the care of  this patient. This was time spent independent of any time provided by nurse practitioner or PA.  Elida Ross, MD [facility] [phone]  If 7pm- 7am, please page neurology on call as listed in AMION.     [1]  Allergies Allergen Reactions   Oxycodone -Acetaminophen     Emetrol     Other Reaction(s): critical   Hydrocodone-Acetaminophen  Other (See Comments)    Reaction: pt can't take anything with tylenol  due to her liver disease.    Ibuprofen Other (See Comments)    Esophageal varices   Naproxen Other (See Comments)    Esophageal varices   Oseltamivir     Other Reaction(s): critical   Propoxyphene Other (See Comments)    Reaction: pt isn't sure, but knows she can't take it.    Shellfish Allergy Hives   Sodium Ferric Gluconate [Ferrous Gluconate]     IV IRON    Tamiflu  [Oseltamivir Phosphate] Other (See Comments)    Esophageal varices  [2]  Current Facility-Administered Medications:    albuterol  (PROVENTIL ) (2.5 MG/3ML) 0.083% nebulizer solution 2.5 mg, 2.5 mg, Nebulization, Q4H PRN, Niu, Xilin, MD   cefTRIAXone  (ROCEPHIN ) 2 g in sodium chloride  0.9 % 100 mL IVPB, 2 g, Intravenous, Q24H, Shellia Mann D, NP, Stopped at 11/08/24 1353   Chlorhexidine  Gluconate Cloth 2 % PADS 6 each, 6 each, Topical, QHS, Rust-Chester, Jenita CROME, NP, 6 each at 11/08/24 9378   fentaNYL  (SUBLIMAZE ) injection 50 mcg, 50 mcg, Intravenous, Q15 min PRN, Rust-Chester, Jenita L, NP   fentaNYL  (SUBLIMAZE ) injection 50-200 mcg,  50-200 mcg, Intravenous, Q30 min PRN, Rust-Chester, Britton L, NP   folic acid  1 mg in sodium chloride  0.9 % 50 mL IVPB, 1 mg, Intravenous, Daily, Rust-Chester, Jenita CROME, NP, Stopped at 11/08/24 1029   lactated ringers  infusion, , Intravenous, Continuous, Rust-Chester, Britton L, NP, Last Rate: 100 mL/hr at 11/09/24 0600, Infusion Verify at 11/09/24 0600   levETIRAcetam  (KEPPRA ) undiluted injection 500 mg, 500 mg, Intravenous, Q12H, Rust-Chester, Jenita CROME, NP,  500 mg at 11/09/24 0223   lidocaine  (LIDODERM ) 5 % 1 patch, 1 patch, Transdermal, Q24H, Mulvihill, Caitlin M, MD, 1 patch at 11/07/24 1732   midazolam  PF (VERSED ) injection 1-2 mg, 1-2 mg, Intravenous, Q1H PRN, Rust-Chester, Jenita CROME, NP   norepinephrine  (LEVOPHED ) 4mg  in (0.016 mg/mL) premix infusion, 0-40 mcg/min, Intravenous, Titrated, Rust-Chester, Britton L, NP, Last Rate: 15 mL/hr at 11/09/24 0600, 4 mcg/min at 11/09/24 0600   [COMPLETED] octreotide  (SANDOSTATIN ) 2 mcg/mL load via infusion 50 mcg, 50 mcg, Intravenous, Once, 50 mcg at 11/08/24 0656 **AND** octreotide  (SANDOSTATIN ) 500 mcg in sodium chloride  0.9 % 250 mL (2 mcg/mL) infusion, 50 mcg/hr, Intravenous, Continuous, Rust-Chester, Britton L, NP, Last Rate: 25 mL/hr at 11/09/24 0600, 50 mcg/hr at 11/09/24 0600   Oral care mouth rinse, 15 mL, Mouth Rinse, Q2H, Rust-Chester, Britton L, NP, 15 mL at 11/09/24 0505   Oral care mouth rinse, 15 mL, Mouth Rinse, PRN, Rust-Chester, Jenita L, NP   pantoprazole  (PROTONIX ) injection 40 mg, 40 mg, Intravenous, Q12H, Rust-Chester, Britton L, NP, 40 mg at 11/08/24 2124   polyethylene glycol (MIRALAX  / GLYCOLAX ) packet 17 g, 17 g, Per Tube, Daily, Rust-Chester, Britton L, NP   prochlorperazine  (COMPAZINE ) injection 10 mg, 10 mg, Intravenous, Q6H PRN, Trudy Anthony HERO, MD, 10 mg at 11/07/24 1547   scopolamine  (TRANSDERM-SCOP) 1 MG/3DAYS 1 mg, 1 patch, Transdermal, Q72H, Trudy Anthony HERO, MD, 1 mg at 11/06/24 1248   thiamine  (VITAMIN  B1) tablet 100 mg, 100 mg, Oral, Daily, 100 mg at 11/07/24 0820 **OR** thiamine  (VITAMIN B1) injection 100 mg, 100 mg, Intravenous, Daily, Niu, Xilin, MD, 100 mg at 11/08/24 [phone]

## 2024-11-09 NOTE — Progress Notes (Signed)
 NAME:  Kathryn [name], MRN:  [id number], DOB:  1956-01-19, LOS: 5 ADMISSION DATE:  11/04/2024, CONSULTATION DATE:  11/08/24 REFERRING MD:  Dr. Lawence, CHIEF COMPLAINT:  Left flank pain, lower abdominal pain   History of Present Illness:  68 yo F presenting to [facility] ED from home on 11/04/24 for evaluation of left flank pain.  History obtained per chart review as patient is unable to participate in interview at this time. Patient reported that constant, aching, moderate to severe left flank pain and left lower abdominal pain began overnight on 11/03/2024.  She also endorsed nausea with a few episodes of nonbloody vomiting, as well as a dry cough and mild shortness of breath.  She denied chest pain, urinary symptoms, diarrhea, fever or chills.  Her last drink of vodka was on 11/03/2024 and she has been taking her Eliquis  as prescribed.  Patient also at some point described having dark stools but does take iron  pills outpatient.  ED course: Upon arrival patient alert and responsive, in sinus bradycardia and hypertensive but otherwise stable vitals on room air.  Labs significant for mild AGMA, transaminitis, mildly elevated ammonia, leukopenia, chronic anemia and thrombocytopenia.  Imaging revealed pancreatitis, chronic cirrhosis & cholelithiasis.  TRH consulted for admission. Medications given: Morphine , Zofran , IV contrast Initial Vitals: 97.6, 18, 55, 168/91 and 100% on room air  Hospital course:  See significant Hospital events for more details. Overnight 12/16 to 11/08/2024 patient had an abrupt change in mentation and TRH coverage was called bedside.  Per attendings description, her eyes deviated upwards she was unresponsive rigid and posturing in her bilateral upper extremities with tremors in 1 leg.  She received 1 dose of Ativan  that did not stop this activity, the activity stopped on its own after a few minutes.  During the same event she had had a moderate episode of melena with correlating  hypotension.  There was concern the patient had lost her pulse and [facility] team was called with CPR initiated.  But patient quickly reached up and CPR stopped.  However due to persistent encephalopathy patient was intubated and placed on mechanical ventilatory support for airway protection and transferred to the ICU on vasopressors due to hypotension.  Most recent significant labs:  EKG Interpretation: Date: 11/04/2024, EKG Time: 11: 42, Rate: 56, Rhythm: SB, QRS Axis:  normal, Intervals: normal, ST/T Wave abnormalities: none, Narrative Interpretation: SB  Chemistry: Na+:141, K+: 4.1, BUN/Cr.: 21/1.16, Serum CO2/ AG: 21/11, AlK phos: 236, albumin: 2.9, AST/ALT: 321/ 51, T.Bili: 3.2, lipase: 60 Hematology: WBC: 8.1, Hgb: 5.5, platelet: 92 Troponin: 19 > 21, BNP: 279, Lactic/ PCT: pending, COVID-19 & Influenza A/B: negative ABG: 7.34/ 28/427/15.1  CXR 11/08/24: no acute cardiopulmonary process CT abdomen and pelvis with contrast 11/04/2024:  Profound hepatic steatosis with nodular contours of the liver, suggestive of cirrhosis. There is sequela of portal hypertension including small volume ascites, multiple abdominal collaterals, and favored portal colopathy. 2. Cholelithiasis with similar circumferential wall prominence of the gallbladder compared to priors. This is favored to reflect sequela of underlying liver disease. If there is clinical concern for acute cholecystitis, recommend dedicated RIGHT upper quadrant ultrasound. 3. Nonspecific fat stranding throughout the epigastric area. This could reflect sequela of portal  hypertension, pancreatitis or sequela of duodenitis. Recommend correlation with lipase levels. 4. There is wall thickening of the stomach, duodenum and colon. This is nonspecific and could reflect a nonspecific enteritis or sequela of liver disease. 5. There are several small hypoenhancing areas along the pancreatic body  and tail measuring approximately 5 mm. These are nonspecific  and could reflect small pseudocysts or side branch IPMNs. Recommend further evaluation with nonemergent outpatient pancreatic protocol MRI with and without contrast. 6. There is heterogeneous expansion of the endometrial canal spanning approximately 12 mm. Recommend further evaluation with pelvic ultrasound when clinically appropriate. Aortic Atherosclerosis  US  abdomen limited RUQ 11/04/2024:  Gallstones in the neck of the gallbladder with borderline wall thickening and cholecystic fluid. Negative sonographic murphy sign. Correlate clinically for acute cholecystitis. 2. Nodular liver contour with increased, coarsened echotexture, compatible with cirrhosis; evaluation limited by increased echogenicity. 3. Small amount of free fluid in the right upper quadrant.   PCCM consulted for assistance in management and monitoring due to hypovolemic shock suspect secondary to acute blood loss anemia, acute hypoxic respiratory failure requiring intubation and mechanical ventilatory support & suspected new onset seizure activity.  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  Attempted MRCP but patient noncooperative and had to be aborted.  Overnight mentation changed suddenly, suspected new onset seizure activity and circulatory shock requiring intubation and mechanical  ventilatory support, vasopressors and transferred to ICU. 11/08/24: Remains critically ill requiring mechanical ventilation and vasopressor support. Head CT pending. General surgery, GI and Neuro following. 11/09/24: Remains critically ill requiring mechanical ventilation and vasopressor support. Not currently on sedation but obtunded. MRI pending. GI, Gen Surg and Neuro following.  Interim History / Subjective:  See above listed under significant hospital events.  Objective    Blood pressure 98/60, pulse 63, temperature 97.7 F (36.5 C), temperature source Axillary, resp. rate (!) 21, height 5' 4 (1.626 m), weight 89.2 kg, SpO2 100%.    Vent Mode: PRVC FiO2 (%):  [28 %-50 %] 28 % Set Rate:  [15 bmp] 15 bmp Vt Set:  [450 mL] 450 mL PEEP:  [5 cmH20] 5 cmH20   Intake/Output Summary (Last 24 hours) at 11/09/2024 0759 Last data filed at 11/09/2024 0600 Gross per 24 hour  Intake 4328.69 ml  Output 277 ml  Net 4051.69 ml   Filed Weights   11/06/24 0430 11/07/24 0155 11/09/24 0451  Weight: 84.9 kg 87.3 kg 89.2 kg    Examination: General: critically ill appearing female, lying in bed intubated and in NAD HEENT: MM pink/moist, scleral icterus with edema, atraumatic, neck supple CV: RRR, S1 S2, no r/m/g Pulm: Regular, synchronous with the ventilator, clear bilateral upper lobes with trace crackles of bilateral lower lobes, no respiratory distress GI: soft, distended + rounded, no rebound/guarding, bowel sounds x 4 Skin: Limited exam- no rashes/lesions noted Extremities: warm/dry, pulses + 2 R/P, trace edema noted BLE Neuro: does not follow commands or withdraw from pain, right pupil non-reactive to light, left pupil with very slow response, bilateral deviation   Resolved problem list   Assessment and Plan   #Acute Metabolic Encephalopathy #Diffuse Encephalopathy s/t Anoxic Brain Injury iso Cardiac Arrest #EtOH Abuse 11/09/24 Brain MRI: Restricted diffusion throughout the  cerebral cortex bilaterally, with relative sparing of the precentral and postcentral gyri,  occipital lobes, mesial temporal lobes, cerebellum, and brainstem, compatible with a global anoxic/hypoxic insult. Mild blooming artifact within the globus pallidus bilaterally. - PAD protocol - Supportive care - CIWA as able - Folic acid  and thiamine  - Not currently requiring sedation to maintain RASS - Continue Keppra  BID - EEG done - Versed  prn - Neurology following, appreciate input  #Acute Hypoxic Respiratory Failure  PMHx: COPD & asthma - Full ventilator support - Wean PEEP & FiO2 as tolerated, maintain SpO2 > 90% - Head of bed elevated 30 degrees - VAP protocol in place - Plateau pressures less than 30 cm H20  - Intermittent chest x-ray & ABG PRN - Daily WUA with SBT as tolerated; mental status barrier to SBT - Ensure adequate pulmonary hygiene  - Bronchodilators PRN  #Hemorrhagic Shock s/t ABLA iso Acute GIB #Chronic / Paroxysmal Atrial Fibrillation  #Chronic HFpEF PMHx: HTN, HLD - Continuous cardiac monitoring - Vasopressors to maintain MAP goal >65 ~ remains on levophed  gtt - Wean vasopressors as able - Continue holding Eliquis  and antihypertensives - EKG prn - Trend lactic until peaked  #AGMA + Lactic Acidosis #CKD stage IIIa - Strict I/O's - Trend BMP - Avoid nephrotoxins as able - Ensure adequate renal perfusion - Cautious IVF hydration as able  #Acute Liver Failure #Alcoholic Pancreatitis ~ Improving #Chronic Alcoholic Cirrhosis #Esophageal Varices #Portal hypertension and portal vein thrombosis #Elevated INR - Trend hepatic function - Avoid hepatotoxic agents - Remains on octreotide  - Continue holding lactulose  d/t lack of access - Was planning for EGD today but will hold off for now - GI consulted, general surgery following appreciate input - GI ppx: Protonix  - Diet: NPO  #ABLA iso Acute GIB #Chronic thrombocytopenia - Trend CBC and PT/INR - Monitor  coag panel - Received 2 units pRBC's and 1 unit FFP - Hgb stable; DC Q4h H&H - Eliquis  on hold - Monitor for s/s of bleeding  #Hypothyroidism #Hyperglycemia without Diagnosis of Type II Diabetes Mellitus - ICU hypo/hyperglycemia protocol - CBG q4h - SSI - Goal range 140-180 - Hold levothyroxine  for now  Labs   CBC: Recent Labs  Lab 11/05/24 0427 11/06/24 0436 11/07/24 0331 11/07/24 2230 11/08/24 0556 11/08/24 1335 11/08/24 1811 11/08/24 2333 11/09/24 0414 11/09/24 0506  WBC 4.4 3.1* 4.6  --  8.1  --   --   --  8.2  --   HGB 9.4* 7.9* 6.0*   < > 5.5* 9.4* 8.7* 8.4* 8.1* 8.1*  HCT 29.2* 23.9* 18.3*   < > 16.5* 27.4* 25.6* 24.5* 23.5* 23.6*  MCV 95.1 93.7 92.9  --  91.7  --   --   --  89.0  --   PLT 55* 44* 69*  --  92*  --   --   --  72*  --    < > = values in this interval not displayed.    Basic Metabolic Panel: Recent Labs  Lab 11/04/24 1940 11/05/24 0745 11/06/24 0436 11/07/24 0331 11/08/24 0556 11/08/24 1335 11/09/24 0414  NA  --    < > 141 141 143 143 142  K  --    < > 4.3 4.1 3.6 4.1 3.7  CL  --    < > 104 106 106 107 107  CO2  --    < > 25 24 16* 17* 20*  GLUCOSE  --    < > 92 106* 164* 186* 140*  BUN  --    < > 21 21 30* 31* 37*  CREATININE  --    < > 1.19* 1.16* 1.80* 1.87* 1.82*  CALCIUM   --    < > 7.8* 7.9* 7.6* 8.3* 7.6*  MG 2.0  --   --   --  1.9  --  2.1  PHOS 3.7  --   --   --  2.2*  --  2.1*   < > = values in this interval not displayed.   GFR: Estimated Creatinine Clearance: 32 mL/min (A) (by C-G formula based on SCr of 1.82 mg/dL (H)). Recent Labs  Lab 11/06/24 0436 11/07/24 0331 11/08/24 0522 11/08/24 0556 11/08/24 0837 11/08/24 2012 11/08/24 2334 11/09/24 0217 11/09/24 0414 11/09/24 0506  PROCALCITON  --   --  5.34  --   --   --   --   --   --   --   WBC 3.1* 4.6  --  8.1  --   --   --   --  8.2  --   LATICACIDVEN  --   --   --  >9.0*   < > 5.7* 5.0* 4.9*  --  5.2*   < > = values in this interval not displayed.    Liver  Function Tests: Recent Labs  Lab 11/04/24 1208 11/05/24 0745 11/06/24 0436 11/07/24 0331 11/08/24 0556 11/09/24 0414  AST 395* 269* 211* 321* 187*  --   ALT 63* 52* 42 51* 44  --   ALKPHOS 239* 208* 176* 236* 150*  --   BILITOT 1.6* 2.5* 2.2* 3.2* 2.9*  --   PROT 7.7 7.2 6.4* 5.8* 4.9*  --   ALBUMIN 3.5 3.5 3.0* 2.9* 2.5* 2.6*   Recent Labs  Lab 11/04/24 1208 11/04/24 1940 11/08/24 0522  LIPASE 637* 434* 60*   Recent Labs  Lab 11/04/24 1940 11/08/24 0556  AMMONIA 43* 243*    ABG    Component Value Date/Time   PHART 7.34 (L) 11/08/2024 0530   PCO2ART 28 (L) 11/08/2024 0530   PO2ART 427 (H) 11/08/2024 0530   HCO3 15.1 (L) 11/08/2024 0530   ACIDBASEDEF 9.2 (H) 11/08/2024 0530   O2SAT 100 11/08/2024 0530     Coagulation Profile: Recent Labs  Lab 11/04/24 1940 11/07/24 1026 11/08/24 0522 11/09/24 0414  INR 1.5* 2.7* 3.4* 2.2*    Cardiac Enzymes: Recent Labs  Lab 11/09/24 0414  CKTOTAL 60    HbA1C: Hgb A1c MFr Bld  Date/Time Value Ref Range Status  07/27/2023 11:05 AM 5.6 4.8 - 5.6 % Final    Comment:    (NOTE) Pre diabetes:          5.7%-6.4%  Diabetes:              >6.4%  Glycemic control for   <7.0% adults with diabetes   06/24/2021 04:30 PM 5.3 4.8 - 5.6 % Final    Comment:    (NOTE) Pre diabetes:          5.7%-6.4%  Diabetes:              >6.4%  Glycemic control for   <7.0% adults with diabetes     CBG: Recent Labs  Lab 11/08/24 1617 11/08/24 1950 11/09/24 0004 11/09/24 0424 11/09/24 0735  GLUCAP 146* 136* 133* 132* 120*    Review of Systems:   UTA- patient unresponsive on mechanical ventilation at this time  Past Medical History:  She,  has a past medical history of Alcoholic cirrhosis of liver (HCC), Asthma, CHF (congestive heart failure) (HCC), Chronic disease  anemia, Esophageal varices (HCC), ETOH abuse, Hypertension, Hypothyroidism, Murmur, Portal hypertension (HCC), and Sarcoidosis.   Surgical History:   Past  Surgical History:  Procedure Laterality Date   COLONOSCOPY N/A 07/29/2024   Procedure: COLONOSCOPY;  Surgeon: Maryruth Ole DASEN, MD;  Location: [facility] ENDOSCOPY;  Service: Endoscopy;  Laterality: N/A;   ESOPHAGOGASTRODUODENOSCOPY N/A 06/26/2021   Procedure: ESOPHAGOGASTRODUODENOSCOPY (EGD);  Surgeon: Toledo, Ladell POUR, MD;  Location: [facility] ENDOSCOPY;  Service: Gastroenterology;  Laterality: N/A;   ESOPHAGOGASTRODUODENOSCOPY N/A 07/29/2024   Procedure: EGD (ESOPHAGOGASTRODUODENOSCOPY);  Surgeon: Maryruth Ole DASEN, MD;  Location: [facility] ENDOSCOPY;  Service: Endoscopy;  Laterality: N/A;   ESOPHAGOGASTRODUODENOSCOPY (EGD) WITH PROPOFOL  N/A 04/04/2015   Procedure: ESOPHAGOGASTRODUODENOSCOPY (EGD) WITH PROPOFOL ;  Surgeon: Rogelia Copping, MD;  Location: [facility] ENDOSCOPY;  Service: Endoscopy;  Laterality: N/A;  Dr COPPING prefers 1445   ESOPHAGOGASTRODUODENOSCOPY (EGD) WITH PROPOFOL  N/A 12/04/2022   Procedure: ESOPHAGOGASTRODUODENOSCOPY (EGD) WITH PROPOFOL ;  Surgeon: Therisa Bi, MD;  Location: [facility] ENDOSCOPY;  Service: Gastroenterology;  Laterality: N/A;   ESOPHAGOGASTRODUODENOSCOPY W/ BANDING  09/2014   Rein   LEFT HEART CATH AND CORONARY ANGIOGRAPHY N/A 02/21/2024   Procedure: LEFT HEART CATH AND CORONARY ANGIOGRAPHY;  Surgeon: Mady Bruckner, MD;  Location: [facility] INVASIVE CV LAB;  Service: Cardiovascular;  Laterality: N/A;   TUBAL LIGATION       Social History:   reports that she has never smoked. She has never used smokeless tobacco. She reports current alcohol use of about 15.0 standard drinks of alcohol per week. She reports that she does not use drugs.   Family History:  Her family history includes Aneurysm in her father; COPD in her father; Cerebral aneurysm in her father; Heart attack in her mother; Hypertension in her brother, mother, and sister.   Allergies Allergies[1]   Home Medications  Prior to Admission medications  Medication Sig Start Date End Date Taking? Authorizing Provider  albuterol   (VENTOLIN  HFA) 108 (90 Base) MCG/ACT inhaler Inhale 1-2 puffs into the lungs every 4 (four) hours as needed for wheezing or shortness of breath. 06/14/23  Yes [provider]  apixaban  (ELIQUIS ) 5 MG TABS tablet Take 1 tablet (5 mg total) by mouth 2 (two) times daily. 02/22/24  Yes Lenon Marien CROME, MD  ferrous sulfate  325 (65 FE) MG tablet Take 1 tablet (325 mg total) by mouth daily with breakfast. 08/03/24  Yes Djan, Drue DASEN, MD  folic acid  (FOLVITE ) 1 MG tablet Take 1 tablet (1 mg total) by mouth daily. 08/03/24  Yes Dorinda Drue DASEN, MD  levothyroxine  (SYNTHROID ) 112 MCG tablet 1 tablet in the morning on an empty stomach Orally Once a day for 90 days 04/26/24  Yes [provider]  Multiple Vitamin (MULTIVITAMIN) tablet Take 1 tablet by mouth every other day.   Yes [provider]  nadolol  (CORGARD ) 80 MG tablet Take by mouth daily. For 90 days   Yes [provider]  pantoprazole  (PROTONIX ) 40 MG tablet Take 1 tablet (40 mg total) by mouth daily. 08/03/24  Yes Djan, Drue DASEN, MD  senna-docusate (SENOKOT-S) 8.6-50 MG tablet Take 1 tablet by mouth at bedtime as needed for mild constipation. 08/02/24  Yes Dorinda Drue DASEN, MD  atorvastatin  (LIPITOR) 10 MG tablet Take 1 tablet (10 mg total) by mouth daily. Patient not taking: Reported on 07/27/2024 02/23/24   Lenon Marien CROME, MD  Blood Glucose Monitoring Suppl (BLOOD GLUCOSE MONITOR SYSTEM) w/Device KIT as directed as directed daily for 30 days 06/08/24   [provider]  thiamine  (VITAMIN B-1)  100 MG tablet Take 1 tablet (100 mg total) by mouth daily. 02/23/24   Lenon Marien CROME, MD     Critical care time: 65 minutes     Danisha Brassfield, PA-C [facility] PCCM Team Contact Info: [phone]      [1]  Allergies Allergen Reactions   Oxycodone -Acetaminophen     Emetrol     Other Reaction(s): critical   Hydrocodone-Acetaminophen  Other (See Comments)    Reaction: pt can't take  anything with tylenol  due to her liver disease.    Ibuprofen Other (See Comments)    Esophageal varices   Naproxen Other (See Comments)    Esophageal varices   Oseltamivir     Other Reaction(s): critical   Propoxyphene Other (See Comments)    Reaction: pt isn't sure, but knows she can't take it.    Shellfish Allergy Hives   Sodium Ferric Gluconate [Ferrous Gluconate]     IV IRON    Tamiflu  [Oseltamivir Phosphate] Other (See Comments)    Esophageal varices

## 2024-11-09 NOTE — OR Nursing (Signed)
 CALLED [facility] FOR REPORT FOR EGD. PT WITH POSITIVE MRI . DR ISAIAH HAS PLACED PROCEDURE ON HOLD. DR [facility] NOTIFIED

## 2024-11-10 DIAGNOSIS — J9602 Acute respiratory failure with hypercapnia: Secondary | ICD-10-CM

## 2024-11-10 DIAGNOSIS — I469 Cardiac arrest, cause unspecified: Secondary | ICD-10-CM

## 2024-11-10 LAB — RENAL FUNCTION PANEL
Albumin: 2.3 g/dL — ABNORMAL LOW (ref 3.5–5.0)
Anion gap: 16 — ABNORMAL HIGH (ref 5–15)
BUN: 40 mg/dL — ABNORMAL HIGH (ref 8–23)
CO2: 18 mmol/L — ABNORMAL LOW (ref 22–32)
Calcium: 7.2 mg/dL — ABNORMAL LOW (ref 8.9–10.3)
Chloride: 106 mmol/L (ref 98–111)
Creatinine, Ser: 1.53 mg/dL — ABNORMAL HIGH (ref 0.44–1.00)
GFR, Estimated: 37 mL/min — ABNORMAL LOW
Glucose, Bld: 149 mg/dL — ABNORMAL HIGH (ref 70–99)
Phosphorus: 2.9 mg/dL (ref 2.5–4.6)
Potassium: 3.1 mmol/L — ABNORMAL LOW (ref 3.5–5.1)
Sodium: 140 mmol/L (ref 135–145)

## 2024-11-10 LAB — GLUCOSE, CAPILLARY
Glucose-Capillary: 141 mg/dL — ABNORMAL HIGH (ref 70–99)
Glucose-Capillary: 153 mg/dL — ABNORMAL HIGH (ref 70–99)
Glucose-Capillary: 134 mg/dL — ABNORMAL HIGH (ref 70–99)
Glucose-Capillary: 109 mg/dL — ABNORMAL HIGH (ref 70–99)
Glucose-Capillary: 119 mg/dL — ABNORMAL HIGH (ref 70–99)
Glucose-Capillary: 122 mg/dL — ABNORMAL HIGH (ref 70–99)

## 2024-11-10 LAB — CBC
HCT: 22.9 % — ABNORMAL LOW (ref 36.0–46.0)
Hemoglobin: 8 g/dL — ABNORMAL LOW (ref 12.0–15.0)
MCH: 30.8 pg (ref 26.0–34.0)
MCHC: 34.9 g/dL (ref 30.0–36.0)
MCV: 88.1 fL (ref 80.0–100.0)
Platelets: 48 K/uL — ABNORMAL LOW (ref 150–400)
RBC: 2.6 MIL/uL — ABNORMAL LOW (ref 3.87–5.11)
RDW: 18.8 % — ABNORMAL HIGH (ref 11.5–15.5)
WBC: 7.6 K/uL (ref 4.0–10.5)
nRBC: 6.9 % — ABNORMAL HIGH (ref 0.0–0.2)

## 2024-11-10 LAB — HEPATIC FUNCTION PANEL
ALT: 33 U/L (ref 0–44)
AST: 90 U/L — ABNORMAL HIGH (ref 15–41)
Albumin: 2.3 g/dL — ABNORMAL LOW (ref 3.5–5.0)
Alkaline Phosphatase: 106 U/L (ref 38–126)
Bilirubin, Direct: 1.6 mg/dL — ABNORMAL HIGH (ref 0.0–0.2)
Indirect Bilirubin: 0.4 mg/dL (ref 0.3–0.9)
Total Bilirubin: 2 mg/dL — ABNORMAL HIGH (ref 0.0–1.2)
Total Protein: 4.6 g/dL — ABNORMAL LOW (ref 6.5–8.1)

## 2024-11-10 LAB — TRIGLYCERIDES: Triglycerides: 250 mg/dL — ABNORMAL HIGH

## 2024-11-10 LAB — HEMOGLOBIN AND HEMATOCRIT, BLOOD
HCT: 21.3 % — ABNORMAL LOW (ref 36.0–46.0)
HCT: 21.6 % — ABNORMAL LOW (ref 36.0–46.0)
HCT: 23.5 % — ABNORMAL LOW (ref 36.0–46.0)
Hemoglobin: 7.4 g/dL — ABNORMAL LOW (ref 12.0–15.0)
Hemoglobin: 7.5 g/dL — ABNORMAL LOW (ref 12.0–15.0)
Hemoglobin: 8 g/dL — ABNORMAL LOW (ref 12.0–15.0)

## 2024-11-10 LAB — PREPARE RBC (CROSSMATCH)

## 2024-11-10 LAB — MAGNESIUM: Magnesium: 1.9 mg/dL (ref 1.7–2.4)

## 2024-11-10 MED ORDER — POTASSIUM CHLORIDE 10 MEQ/50ML IV SOLN
10.0000 meq | INTRAVENOUS | Status: AC
  Administered 2024-11-10 (×6): 10 meq via INTRAVENOUS
  Filled 2024-11-10 (×6): qty 50

## 2024-11-10 MED ORDER — POTASSIUM CHLORIDE 20 MEQ PO PACK: 40.0000 meq | PACK | Freq: Once | ORAL | Status: DC

## 2024-11-10 MED ORDER — FOLIC ACID 5 MG/ML IJ SOLN
1.0000 mg | Freq: Every day | INTRAMUSCULAR | Status: DC
  Filled 2024-11-10: qty 0.2

## 2024-11-10 NOTE — IPAL (Signed)
" °  Interdisciplinary Goals of Care Family Meeting   Date carried out: 11/10/2024  Location of the meeting: Phone conference  Member's involved: Physician, Bedside Registered Nurse, and Family Member or next of kin    GOALS OF CARE DISCUSSION  The Clinical status was relayed to family in detail-Daughter and Sisters  Updated and notified of patients medical condition- Patient remains unresponsive and will not open eyes to command.   Patient with increased WOB and using accessory muscles to breathe Explained to family course of therapy and the modalities  Patient with Progressive multiorgan failure with a very high probablity of a very minimal chance of meaningful recovery despite all aggressive and optimal medical therapy.    Family understands the situation. Severe brain damage, body is shutting down  They have consented and agreed to DNR and would like to proceed with Comfort care measures in next 24 hrs  Family are satisfied with Plan of action and management. All questions answered  Additional CC time 35 mins   Adaleena Mooers Alm Cellar, M.D.  [facility]  Medical Director [facility] Medical Director [facility] Cardio-Pulmonary Department     "

## 2024-11-10 NOTE — Progress Notes (Signed)
 I was notified this morning that the patient had received blood overnight and continues to show signs of bleeding and that the family had decided that they wanted everything done. I informed Dr. Maggie of the patient's high risk of complications due to her multiple comorbidities and anoxic/hypoxic brain injury.  I have also called the patient's daughter for consent to proceed with any endoscopic procedures and explained the risks and benefits.  She has stated that she would like to discuss whether we move forward with endoscopic procedures at this time with her sisters and then we will call the ICU back to let us  know their wishes.  If they do consent for the upper endoscopy that will be done later this morning.

## 2024-11-10 NOTE — Progress Notes (Signed)
 Propofol  stopped per MD. Pt starting twitching movements, MD made aware and propofol  restarted.

## 2024-11-10 NOTE — Progress Notes (Signed)
 PHARMACY CONSULT NOTE  Pharmacy Consult for Electrolyte Monitoring and Replacement   Recent Labs: Potassium (mmol/L)  Date Value  11/10/2024 3.1 (L)  09/26/2014 4.0   Magnesium  (mg/dL)  Date Value  87/80/7974 1.9  12/17/2013 2.7 (H)   Calcium  (mg/dL)  Date Value  87/80/7974 7.2 (L)   Calcium , Total (mg/dL)  Date Value  88/95/7984 7.9 (L)   Albumin (g/dL)  Date Value  87/80/7974 2.3 (L)  11/10/2024 2.3 (L)  04/10/2024 3.6 (L)  09/26/2014 3.2 (L)   Phosphorus (mg/dL)  Date Value  87/80/7974 2.9   Sodium (mmol/L)  Date Value  11/10/2024 140  04/10/2024 143  09/26/2014 136   Assessment: 68 yo F w/ PMH of EtOH abuse, hepatic cirrhosis, esophageal varices, portal HTN, portal vein thrombosis, A-fib on Eliquis , colitis, sarcoidosis,  GI bleeding, iron  deficiency anemia, HTN, HLD, asthma and COPD,  dCHF, loop recorder in place presenting to [facility] ED from home on 11/04/24 for evaluation of left flank pain. Pharmacy is asked to follow and replace electrolytes while in CCU  MIVF: lactated ringers  at 100 ,L/hr  Goal of Therapy:  Potassium 4.0 - 5.1 mmol/L Magnesium  2.0 - 2.4 mg/dL All Other Electrolytes WNL  Plan:  --K 3.1, Kcl 10 mEq IV q1h x 6 ordered per PCCM --Follow-up electrolytes with AM labs tomorrow. Patient with no enteral access currently  Kathryn [name] 11/10/2024 8:50 AM

## 2024-11-10 NOTE — Progress Notes (Signed)
 This patient has a history of alcohol  abuse and GI bleeding which caused her to be admitted to the ICU.  The patient had an MRI of the brain yesterday that showed:  IMPRESSION: 1. Restricted diffusion throughout the cerebral cortex bilaterally, with relative sparing of the precentral and postcentral gyri, occipital lobes, mesial temporal lobes, cerebellum, and brainstem, compatible with a global anoxic/hypoxic insult. Findings discussed with Dr. Matthews at approximately 10:15 AM on 11/09/2024. 2. Mild blooming artifact within the globus pallidus bilaterally.  These findings are consistent with a high risk of any invasive procedure at this time.  With the patient's hemoglobin being stable I would recommend continued supportive care and transfusions as needed.  If the patient should have any further significant bleeding then please do not hesitate to reach out to GI again.

## 2024-11-10 NOTE — Progress Notes (Signed)
 "                                                                                                                                                                                                          Daily Progress Note   Patient Name: Kathryn [name]       Date: 11/10/2024 DOB: September 26, 1956  Age: 68 y.o. MRN#: [id number] Attending Physician: Isaiah Scrivener, MD Primary Care Physician: Odell Chard, Edra GRADE, MD Admit Date: 11/04/2024  Reason for Consultation/Follow-up: Establishing goals of care  Subjective: Notes and labs reviewed.  In to see patient.  She is currently resting in bed at this time on ventilator support.  Vasopressors are in place. Propofol  is in place for seizure activity.  No family at bedside at this time.  Spoke with CCM in detail regarding patient and planning.  They discuss that they were able to speak with patient's daughter this morning, and plans are in place to shift her to comfort care tomorrow after everyone has had a chance to visit and spend time with her.   As per conversation with CCM, CCM will manage terminal extubation tomorrow and PMT will sign off.  Please reconsult if needs arise.  Length of Stay: 6  Current Medications: Scheduled Meds:   Chlorhexidine  Gluconate Cloth  6 each Topical QHS   [START ON 11/07/2024] folic acid   1 mg Intravenous Daily   levETIRAcetam   500 mg Intravenous Q12H   lidocaine   1 patch Transdermal Q24H   mouth rinse  15 mL Mouth Rinse Q2H   pantoprazole  (PROTONIX ) IV  40 mg Intravenous Q12H   polyethylene glycol  17 g Per Tube Daily   thiamine   100 mg Oral Daily   Or   thiamine   100 mg Intravenous Daily    Continuous Infusions:  cefTRIAXone  (ROCEPHIN )  IV 2 g (11/10/24 1205)   norepinephrine  (LEVOPHED ) Adult infusion 1 mcg/min (11/10/24 1200)   octreotide  (SANDOSTATIN ) 500 mcg in sodium chloride  0.9 % 250 mL (2 mcg/mL) infusion 50 mcg/hr (11/10/24 1200)   propofol  (DIPRIVAN ) infusion 15 mcg/kg/min (11/10/24 1228)    vasopressin  0.03 Units/min (11/10/24 1200)    PRN Meds: albuterol , fentaNYL  (SUBLIMAZE ) injection, fentaNYL  (SUBLIMAZE ) injection, LORazepam , midazolam  PF, mouth rinse, prochlorperazine   Physical Exam Constitutional:      Comments: Eyes closed  Pulmonary:     Comments: On ventilator support Skin:    General: Skin is warm and dry.             Vital Signs: BP 113/66   Pulse (!) 51  Temp (!) 96.6 F (35.9 C) (Rectal)   Resp 18   Ht 5' 4 (1.626 m)   Wt 96 kg   SpO2 100%   BMI 36.33 kg/m  SpO2: SpO2: 100 % O2 Device: O2 Device: Ventilator O2 Flow Rate: O2 Flow Rate (L/min): 2 L/min  Intake/output summary:  Intake/Output Summary (Last 24 hours) at 11/10/2024 1505 Last data filed at 11/10/2024 1447 Gross per 24 hour  Intake 4034.06 ml  Output 395 ml  Net 3639.06 ml   LBM: Last BM Date : 11/10/24 Baseline Weight: Weight: 88 kg Most recent weight: Weight: 96 kg   Patient Active Problem List   Diagnosis Date Noted   Shock circulatory (HCC) 11/08/2024   Melena 11/08/2024   Acute gallstone pancreatitis 11/04/2024   Alcoholic pancreatitis 11/04/2024   Atrial fibrillation, chronic (HCC) 11/04/2024   Chronic diastolic CHF (congestive heart failure) (HCC) 11/04/2024   Pancreatic cyst 11/04/2024   COPD (chronic obstructive pulmonary disease) (HCC) 11/04/2024   Myocardial injury 11/04/2024   Cough with hemoptysis 07/27/2024   Hyperlipemia 07/27/2024   AKI (acute kidney injury) 02/22/2024   Alcohol use 02/22/2024   Demand ischemia (HCC) 02/21/2024   Atrial fibrillation with rapid ventricular response (HCC) 02/18/2024   NSTEMI (non-ST elevated myocardial infarction) (HCC) 02/18/2024   Alcoholic cirrhosis of liver (HCC)    Nausea vomiting and diarrhea 07/28/2023   Nausea and vomiting 07/28/2023   Colitis 07/27/2023   High anion gap metabolic acidosis 07/27/2023   Alcohol use disorder 12/02/2022   Sarcoid 12/02/2022   Hypokalemia 01/05/2022   Hypomagnesemia 01/05/2022    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   Physical deconditioning 01/04/2022   Cholelithiasis without cholecystitis 01/03/2022   Thrombocytopenia 01/03/2022   Aortic atherosclerosis    Acute pancreatitis 01/02/2022   Symptomatic anemia 06/24/2021   Hypertensive urgency 06/24/2021   Asthma 06/24/2021   CKD (chronic kidney disease), stage IIIa 06/24/2021   Chronic CHF (HCC) 06/24/2021   Pancytopenia (HCC) 06/24/2021   Community acquired pneumonia 11/20/2019   Pneumonia due to COVID-19 virus 11/20/2019   Anemia 06/12/2016   Hypoglycemia 02/11/2016   Hematemesis 04/04/2015   Cirrhosis (HCC) 04/04/2015   Esophageal varices (HCC) 04/04/2015   Hypothyroidism 04/04/2015   Hypertension 04/04/2015   Chest pain 04/04/2015    Palliative Care Assessment & Plan   Recommendations/Plan: Plans in place for terminal extubation tomorrow; CCM will handle.  PMT will sign off, please reconsult if needs arise.  Code Status:    Code Status Orders  (From admission, onward)           Start     Ordered   11/10/24 1244  Do not attempt resuscitation (DNR) Pre-Arrest Interventions Desired  (Code Status)  Continuous       Question Answer Comment  If pulseless and not breathing No CPR or chest compressions.   In Pre-Arrest Conditions (Patient Has Pulse and Is Breathing) May intubate, use advanced airway interventions and cardioversion/ACLS medications if appropriate or indicated. May transfer to ICU.   Consent: Discussion documented in EHR or advanced directives reviewed      11/10/24 1243           Code Status History     Date Active Date Inactive Code Status Order ID Comments User Context   11/04/2024 1929 11/10/2024 1243 Full Code [id number]  Hilma Rankins, MD ED   07/27/2024 1323 08/02/2024 1931 Full Code [id number]  Cox, Greig SAILOR, DO ED   02/18/2024 0528 02/22/2024 1816 Full Code [id number]  Cleatus Delayne GAILS,  MD ED   07/27/2023 1610 07/28/2023 2338 Full Code [id number]  Eldonna Elspeth PARAS, MD ED   12/02/2022  1255 12/05/2022 2044 Full Code [id number]  Cox, Amy N, DO ED   01/02/2022 2254 01/07/2022 2255 Full Code [id number]  Mansy, Madison LABOR, MD ED   06/24/2021 1647 06/27/2021 2149 Full Code [id number]  Hilma Rankins, MD ED   11/20/2019 1654 11/23/2019 1928 Full Code [id number]  Maree Hue, MD ED   06/12/2016 1028 06/15/2016 1440 Full Code [id number]  Tobie Press, MD ED   02/11/2016 1508 02/13/2016 2214 Full Code [id number]  Josette Ade, MD ED   04/04/2015 0548 04/06/2015 1751 Full Code [id number]  Jenel Lenis, MD Inpatient       Camelia Lewis, NP  Please contact Palliative Medicine Team phone at [phone] for questions and concerns.       "

## 2024-11-10 NOTE — Progress Notes (Signed)
 "  NAME:  Kathryn [name], MRN:  [id number], DOB:  1955-11-25, LOS: 6 ADMISSION DATE:  11/04/2024, CONSULTATION DATE:  11/08/24 REFERRING MD:  Dr. Lawence,   CHIEF COMPLAINT:  Left flank pain, lower abdominal pain Cardiac arrest with brain damage   History of Present Illness:  68 yo F presenting to [facility] ED from home on 11/04/24 for evaluation of left flank pain.  History obtained per chart review as patient is unable to participate in interview at this time. Patient reported that constant, aching, moderate to severe left flank pain and left lower abdominal pain began overnight on 11/03/2024.  She also endorsed nausea with a few episodes of nonbloody vomiting, as well as a dry cough and mild shortness of breath.  She denied chest pain, urinary symptoms, diarrhea, fever or chills.  Her last drink of vodka was on 11/03/2024 and she has been taking her Eliquis  as prescribed.  Patient also at some point described having dark stools but does take iron  pills outpatient.  ED course: Upon arrival patient alert and responsive, in sinus bradycardia and hypertensive but otherwise stable vitals on room air.  Labs significant for mild AGMA, transaminitis, mildly elevated ammonia, leukopenia, chronic anemia and thrombocytopenia.  Imaging revealed pancreatitis, chronic cirrhosis & cholelithiasis.  TRH consulted for admission. Medications given: Morphine , Zofran , IV contrast Initial Vitals: 97.6, 18, 55, 168/91 and 100% on room air  Hospital course:  See significant Hospital events for more details. Overnight 12/16 to 11/08/2024 patient had an abrupt change in mentation and TRH coverage was called bedside.  Per attendings description, her eyes deviated upwards she was unresponsive rigid and posturing in her bilateral upper extremities with tremors in 1 leg.  She received 1 dose of Ativan  that did not stop this activity, the activity stopped on its own after a few minutes.  During the same event she had had a moderate  episode of melena with correlating hypotension.  There was concern the patient had lost her pulse and [facility] team was called with CPR initiated.  But patient quickly reached up and CPR stopped.  However due to persistent encephalopathy patient was intubated and placed on mechanical ventilatory support for airway protection and transferred to the ICU on vasopressors due to hypotension.   CXR 11/08/24: no acute cardiopulmonary process CT abdomen and pelvis with contrast 11/04/2024:  Profound hepatic steatosis with nodular contours of the liver, suggestive of cirrhosis. There is sequela of portal hypertension including small volume ascites, multiple abdominal collaterals, and favored portal colopathy. 2. Cholelithiasis with similar circumferential wall prominence of the gallbladder compared to priors. This is favored to reflect sequela of underlying liver disease. If there is clinical concern for acute cholecystitis, recommend dedicated RIGHT upper quadrant ultrasound. 3. Nonspecific fat stranding throughout the epigastric area. This could reflect sequela of portal  hypertension, pancreatitis or sequela of duodenitis. Recommend correlation with lipase levels. 4. There is wall thickening of the stomach, duodenum and colon. This is nonspecific and could reflect a nonspecific enteritis or sequela of liver disease. 5. There are several small hypoenhancing areas along the pancreatic body and tail measuring approximately 5 mm. These are nonspecific and could reflect small pseudocysts or side branch IPMNs. Recommend further evaluation with nonemergent outpatient pancreatic protocol MRI with and without contrast. 6. There is heterogeneous expansion of the endometrial canal spanning approximately 12 mm. Recommend further evaluation with pelvic ultrasound when clinically appropriate. Aortic Atherosclerosis  US  abdomen limited RUQ 11/04/2024:  Gallstones in the neck of the gallbladder  with borderline wall thickening  and cholecystic fluid. Negative sonographic murphy sign. Correlate clinically for acute cholecystitis. 2. Nodular liver contour with increased, coarsened echotexture, compatible with cirrhosis; evaluation limited by increased echogenicity. 3. Small amount of free fluid in the right upper quadrant. MRI severe global anoxic brain injury  PCCM consulted for assistance in management and monitoring due to hypovolemic shock suspect secondary to acute blood loss anemia, acute hypoxic respiratory failure requiring intubation and mechanical ventilatory support & suspected new onset seizure activity.  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  Attempted MRCP but patient noncooperative and had to be aborted.  Overnight mentation changed suddenly, suspected new onset seizure activity and circulatory shock requiring intubation and mechanical ventilatory support, vasopressors and transferred to ICU. 11/08/24: Remains critically ill requiring mechanical ventilation and vasopressor support. Head CT pending. General surgery, GI and Neuro following. 11/09/24: Remains critically ill requiring mechanical ventilation and vasopressor support. Not currently on sedation but obtunded. MRI pending. GI, Gen Surg and Neuro following. 12/19 active bleeding and severe brain  damage  Interim History / Subjective:  Remains critically ill Remains intubated Requires VENT support for survival Severe Brain Damage  Objective    Blood pressure (!) 88/58, pulse (!) 51, temperature (!) 94.5 F (34.7 C), temperature source Rectal, resp. rate (!) 21, height 5' 4 (1.626 m), weight 96 kg, SpO2 100%.    Vent Mode: PRVC FiO2 (%):  [28 %] 28 % Set Rate:  [15 bmp] 15 bmp Vt Set:  [450 mL] 450 mL PEEP:  [5 cmH20] 5 cmH20   Intake/Output Summary (Last 24 hours) at 11/10/2024 0749 Last data filed at 11/10/2024 0600 Gross per 24 hour  Intake 3896.29 ml  Output 315 ml  Net 3581.29 ml   Filed Weights   11/07/24 0155 11/09/24 0451 11/10/24 0500  Weight: 87.3 kg 89.2 kg 96 kg     REVIEW OF SYSTEMS  PATIENT IS UNABLE TO PROVIDE COMPLETE REVIEW OF SYSTEMS DUE TO SEVERE CRITICAL ILLNESS   PHYSICAL EXAMINATION:  GENERAL:critically ill appearing,  EYES: Pupils equal, round, reactive to light.  No scleral icterus.  MOUTH: Moist mucosal membrane. INTUBATED NECK: Supple.  PULMONARY: Lungs clear to auscultation, +rhonchi, +wheezing CARDIOVASCULAR: S1 and S2.  Regular rate and rhythm GASTROINTESTINAL: Soft, nontender, -distended. Positive bowel sounds.  MUSCULOSKELETAL edema.  NEUROLOGIC: obtunded,sedated SKIN:normal, warm to touch, Capillary refill delayed  Pulses present bilaterally         Assessment and Plan   68 yo AAF with severe Acute Metabolic Encephalopathy Diffuse Encephalopathy s/t Anoxic Brain Injury due to  Cardiac Arrest EtOH Abuse and GIB severe ABLA  Severe ACUTE Hypoxic and Hypercapnic Respiratory Failure -continue Mechanical Ventilator support -Wean Fio2 and PEEP as tolerated -VAP/VENT bundle implementation - Wean PEEP & FiO2 as tolerated, maintain SpO2 > 88% - Head of bed elevated 30 degrees, VAP protocol in place - Plateau pressures less than 30 cm H20  - Intermittent chest x-ray & ABG PRN - Ensure adequate pulmonary hygiene  NEUROLOGY ACUTE METABOLIC ENCEPHALOPATHY 11/09/24 Brain MRI: Restricted diffusion throughout the cerebral cortex bilaterally, with relative sparing of the precentral and postcentral gyri, occipital lobes, mesial temporal lobes, cerebellum, and brainstem, compatible with a global anoxic/hypoxic insult. Mild blooming artifact within the globus pallidus bilaterally. - PAD protocol - Supportive care - CIWA as able - Folic acid  and thiamine  - Not currently requiring sedation to maintain RASS - Continue Keppra  BID - EEG done - Versed  prn - Neurology following, appreciate input   Hemorrhagic Shock s/t ABLA iso Acute GIB Chronic / Paroxysmal Atrial Fibrillation  Chronic HFpEF PMHx: HTN, HLD - Continuous cardiac monitoring - Vasopressors to maintain MAP goal >65 ~ remains on levophed  gtt - Wean vasopressors as able - Continue holding Eliquis  and antihypertensives - EKG prn - Trend lactic until peaked GI to scope  AGMA + Lactic Acidosis CKD stage IIIa - Strict I/O's - Trend BMP - Avoid nephrotoxins as able - Ensure adequate renal perfusion - Cautious IVF hydration as able   GI Acute Liver Failure Alcoholic Pancreatitis ~ Improving Chronic Alcoholic Cirrhosis Esophageal Varices Portal hypertension and portal vein thrombosis Elevated INR - Trend hepatic function - Avoid hepatotoxic agents - Remains on octreotide  - Continue holding lactulose  d/t lack of access - Was planning for EGD today but will hold off for now - GI consulted, general surgery following appreciate input Child pugh score 82% mortality alone - GI ppx: Protonix  - Diet: NPO  ABLA iso Acute GIB ACUTE ANEMIA- TRANSFUSE AS NEEDED CONSIDER TRANSFUSION  IF HGB<7 DVT PRX with TED/SCD's ONLY Plan for scope    ENDO - ICU hypoglycemic\Hyperglycemia protocol -check FSBS per protocol  ELECTROLYTES -follow labs as needed -replace as needed -pharmacy consultation and following     Labs    CBC: Recent Labs  Lab 11/06/24 0436 11/07/24 0331 11/07/24 2230 11/08/24 0556 11/08/24 1335 11/08/24 2333 11/09/24 0414 11/09/24 0506 11/09/24 2228 11/10/24 0421  WBC 3.1* 4.6  --  8.1  --   --  8.2  --   --  7.6  HGB 7.9* 6.0*   < > 5.5*   < > 8.4* 8.1* 8.1* 7.3* 8.0*  HCT 23.9* 18.3*   < > 16.5*   < > 24.5* 23.5* 23.6* 21.3* 22.9*  MCV 93.7 92.9  --  91.7  --   --  89.0  --   --  88.1  PLT 44* 69*  --  92*  --   --  72*  --   --  48*   < > = values in this interval not displayed.    Basic Metabolic Panel: Recent Labs  Lab 11/04/24 1940 11/05/24 0745 11/07/24 0331 11/08/24 0556 11/08/24 1335 11/09/24 0414 11/10/24 0421  NA  --    < > 141 143 143 142 140  K  --    < > 4.1 3.6 4.1 3.7 3.1*  CL  --    < > 106 106 107 107 106  CO2  --    < > 24 16* 17* 20* 18*  GLUCOSE  --    < > 106* 164* 186* 140* 149*  BUN  --    < > 21 30* 31* 37* 40*  CREATININE  --    < > 1.16* 1.80* 1.87* 1.82* 1.53*  CALCIUM   --    < > 7.9* 7.6* 8.3* 7.6* 7.2*  MG 2.0  --   --  1.9  --  2.1 1.9  PHOS 3.7  --   --  2.2*  --  2.1* 2.9   < > = values in this interval not displayed.   GFR: Estimated Creatinine Clearance: 39.6 mL/min (A) (by C-G formula based on SCr of 1.53 mg/dL (H)). Recent Labs  Lab 11/07/24 0331 11/08/24 0522 11/08/24 0556 11/08/24 0837 11/08/24 2334 11/09/24 0217 11/09/24 0414 11/09/24 0506 11/09/24 2058 11/10/24 0421  PROCALCITON  --  5.34  --   --   --   --   --   --   --   --   WBC 4.6  --  8.1  --   --   --  8.2  --   --  7.6  LATICACIDVEN  --   --  >9.0*   < > 5.0* 4.9*  --  5.2* 6.1*  --    < > = values in this interval not displayed.    Liver Function Tests: Recent Labs  Lab 11/05/24 0745 11/06/24 0436 11/07/24 0331 11/08/24 0556 11/09/24 0414 11/10/24 0421  AST 269* 211* 321* 187*  --  90*  ALT 52* 42 51* 44  --  33  ALKPHOS 208* 176* 236* 150*  --  106  BILITOT 2.5* 2.2* 3.2* 2.9*  --  2.0*  PROT 7.2 6.4* 5.8* 4.9*  --  4.6*  ALBUMIN 3.5  3.0* 2.9* 2.5* 2.6* 2.3*  2.3*   Recent Labs  Lab 11/04/24 1208 11/04/24 1940 11/08/24 0522  LIPASE 637* 434* 60*   Recent Labs  Lab 11/04/24 1940 11/08/24 0556  AMMONIA 43* 243*    ABG    Component Value Date/Time   PHART 7.34 (L) 11/08/2024 0530   PCO2ART 28 (L) 11/08/2024 0530   PO2ART 427 (H) 11/08/2024 0530   HCO3 15.1 (L) 11/08/2024 0530   ACIDBASEDEF 9.2 (H) 11/08/2024 0530   O2SAT 100 11/08/2024 0530     Coagulation Profile: Recent Labs  Lab 11/04/24 1940 11/07/24 1026 11/08/24 0522 11/09/24 0414  INR 1.5* 2.7* 3.4* 2.2*    Cardiac Enzymes: Recent Labs  Lab 11/09/24 0414  CKTOTAL 60    HbA1C: Hgb A1c MFr Bld  Date/Time Value Ref Range Status  07/27/2023 11:05 AM 5.6 4.8 - 5.6 % Final    Comment:    (NOTE) Pre diabetes:          5.7%-6.4%  Diabetes:              >6.4%  Glycemic control for   <7.0% adults with diabetes   06/24/2021 04:30 PM 5.3 4.8 - 5.6 % Final    Comment:    (NOTE) Pre diabetes:          5.7%-6.4%  Diabetes:              >6.4%  Glycemic control for   <7.0% adults with diabetes     CBG: Recent Labs  Lab 11/09/24 1521 11/09/24 1937 11/09/24 2310 11/10/24 0324 11/10/24 0746  GLUCAP 106* 136* 135* 141* 153*    Review of Systems:   UTA- patient unresponsive on mechanical ventilation at this time  Past Medical History:  She,  has a past medical history of Alcoholic cirrhosis of liver (HCC), Asthma, CHF (congestive heart failure) (HCC), Chronic disease anemia, Esophageal varices (HCC), ETOH abuse, Hypertension, Hypothyroidism, Murmur, Portal hypertension (HCC), and Sarcoidosis.   Surgical History:   Past Surgical History:  Procedure Laterality Date   COLONOSCOPY N/A 07/29/2024   Procedure: COLONOSCOPY;  Surgeon: Maryruth Ole DASEN, MD;  Location: [facility] ENDOSCOPY;  Service: Endoscopy;  Laterality: N/A;  ESOPHAGOGASTRODUODENOSCOPY N/A 06/26/2021   Procedure: ESOPHAGOGASTRODUODENOSCOPY (EGD);  Surgeon: Toledo,  Ladell POUR, MD;  Location: [facility] ENDOSCOPY;  Service: Gastroenterology;  Laterality: N/A;   ESOPHAGOGASTRODUODENOSCOPY N/A 07/29/2024   Procedure: EGD (ESOPHAGOGASTRODUODENOSCOPY);  Surgeon: Maryruth Ole DASEN, MD;  Location: [facility] ENDOSCOPY;  Service: Endoscopy;  Laterality: N/A;   ESOPHAGOGASTRODUODENOSCOPY (EGD) WITH PROPOFOL  N/A 04/04/2015   Procedure: ESOPHAGOGASTRODUODENOSCOPY (EGD) WITH PROPOFOL ;  Surgeon: Rogelia Copping, MD;  Location: [facility] ENDOSCOPY;  Service: Endoscopy;  Laterality: N/A;  Dr COPPING prefers 1445   ESOPHAGOGASTRODUODENOSCOPY (EGD) WITH PROPOFOL  N/A 12/04/2022   Procedure: ESOPHAGOGASTRODUODENOSCOPY (EGD) WITH PROPOFOL ;  Surgeon: Therisa Bi, MD;  Location: [facility] ENDOSCOPY;  Service: Gastroenterology;  Laterality: N/A;   ESOPHAGOGASTRODUODENOSCOPY W/ BANDING  09/2014   Rein   LEFT HEART CATH AND CORONARY ANGIOGRAPHY N/A 02/21/2024   Procedure: LEFT HEART CATH AND CORONARY ANGIOGRAPHY;  Surgeon: Mady Bruckner, MD;  Location: [facility] INVASIVE CV LAB;  Service: Cardiovascular;  Laterality: N/A;   TUBAL LIGATION       Social History:   reports that she has never smoked. She has never used smokeless tobacco. She reports current alcohol  use of about 15.0 standard drinks of alcohol  per week. She reports that she does not use drugs.   Family History:  Her family history includes Aneurysm in her father; COPD in her father; Cerebral aneurysm in her father; Heart attack in her mother; Hypertension in her brother, mother, and sister.   Allergies Allergies[1]   Home Medications  Prior to Admission medications  Medication Sig Start Date End Date Taking? Authorizing Provider  albuterol  (VENTOLIN  HFA) 108 (90 Base) MCG/ACT inhaler Inhale 1-2 puffs into the lungs every 4 (four) hours as needed for wheezing or shortness of breath. 06/14/23  Yes [provider]  apixaban  (ELIQUIS ) 5 MG TABS tablet Take 1 tablet (5 mg total) by mouth 2 (two) times daily. 02/22/24  Yes Lenon Marien CROME, MD   ferrous sulfate  325 (65 FE) MG tablet Take 1 tablet (325 mg total) by mouth daily with breakfast. 08/03/24  Yes Djan, Drue DASEN, MD  folic acid  (FOLVITE ) 1 MG tablet Take 1 tablet (1 mg total) by mouth daily. 08/03/24  Yes Dorinda Drue DASEN, MD  levothyroxine  (SYNTHROID ) 112 MCG tablet 1 tablet in the morning on an empty stomach Orally Once a day for 90 days 04/26/24  Yes [provider]  Multiple Vitamin (MULTIVITAMIN) tablet Take 1 tablet by mouth every other day.   Yes [provider]  nadolol  (CORGARD ) 80 MG tablet Take by mouth daily. For 90 days   Yes [provider]  pantoprazole  (PROTONIX ) 40 MG tablet Take 1 tablet (40 mg total) by mouth daily. 08/03/24  Yes Djan, Drue DASEN, MD  senna-docusate (SENOKOT-S) 8.6-50 MG tablet Take 1 tablet by mouth at bedtime as needed for mild constipation. 08/02/24  Yes Dorinda Drue DASEN, MD  atorvastatin  (LIPITOR) 10 MG tablet Take 1 tablet (10 mg total) by mouth daily. Patient not taking: Reported on 07/27/2024 02/23/24   Lenon Marien CROME, MD  Blood Glucose Monitoring Suppl (BLOOD GLUCOSE MONITOR SYSTEM) w/Device KIT as directed as directed daily for 30 days 06/08/24   [provider]  thiamine  (VITAMIN B-1) 100 MG tablet Take 1 tablet (100 mg total) by mouth daily. 02/23/24   Lenon Marien CROME, MD      DVT/GI PRX  assessed I Assessed the need for Labs I Assessed the need for Foley I Assessed the need for Central Venous Line Family Discussion when available I Assessed  the need for Mobilization I made an Assessment of medications to be adjusted accordingly Safety Risk assessment completed  CASE DISCUSSED IN MULTIDISCIPLINARY ROUNDS WITH ICU TEAM  OVERALL POOR PROGNOSIS END STAGE LIVER DISEASE WITH CARDIAC ARREST AND SEVERE BRAIN DAMAGE, ACTIVE GIB RECOMMEND DNR STATUS AND COMFORT CARE MEASURES PALLIATIVE CARTE TEAM CONSULTED PATIENT WILL NOT SURVIVE THIS ADMISSION   Critical Care Time devoted to patient care services  described in this note is 65 minutes.  Critical care was necessary to treat /prevent imminent and life-threatening deterioration. Overall, patient is critically ill, prognosis is guarded.  Patient with Multiorgan failure and at high risk for cardiac arrest and death.    Nickolas Alm Cellar, M.D.  [facility]  Medical Director [facility]          [1]  Allergies Allergen Reactions   Oxycodone -Acetaminophen     Emetrol     Other Reaction(s): critical   Hydrocodone-Acetaminophen  Other (See Comments)    Reaction: pt can't take anything with tylenol  due to her liver disease.    Ibuprofen Other (See Comments)    Esophageal varices   Naproxen Other (See Comments)    Esophageal varices   Oseltamivir     Other Reaction(s): critical   Propoxyphene Other (See Comments)    Reaction: pt isn't sure, but knows she can't take it.    Shellfish Allergy Hives   Sodium Ferric Gluconate [Ferrous Gluconate]     IV IRON    Tamiflu  [Oseltamivir Phosphate] Other (See Comments)    Esophageal varices   "

## 2024-11-10 NOTE — Plan of Care (Signed)
  Problem: Education: Goal: Knowledge of General Education information will improve Description: Including pain rating scale, medication(s)/side effects and non-pharmacologic comfort measures Outcome: Not Progressing   Problem: Health Behavior/Discharge Planning: Goal: Ability to manage health-related needs will improve Outcome: Not Progressing   Problem: Clinical Measurements: Goal: Ability to maintain clinical measurements within normal limits will improve Outcome: Not Progressing Goal: Will remain free from infection Outcome: Not Progressing Goal: Diagnostic test results will improve Outcome: Not Progressing Goal: Respiratory complications will improve Outcome: Not Progressing Goal: Cardiovascular complication will be avoided Outcome: Not Progressing   Problem: Activity: Goal: Risk for activity intolerance will decrease Outcome: Not Progressing   Problem: Nutrition: Goal: Adequate nutrition will be maintained Outcome: Not Progressing   Problem: Coping: Goal: Level of anxiety will decrease Outcome: Not Progressing   Problem: Elimination: Goal: Will not experience complications related to bowel motility Outcome: Not Progressing Goal: Will not experience complications related to urinary retention Outcome: Not Progressing   Problem: Pain Managment: Goal: General experience of comfort will improve and/or be controlled Outcome: Not Progressing   Problem: Safety: Goal: Ability to remain free from injury will improve Outcome: Not Progressing   Problem: Skin Integrity: Goal: Risk for impaired skin integrity will decrease Outcome: Not Progressing   Problem: Activity: Goal: Ability to tolerate increased activity will improve Outcome: Not Progressing   Problem: Respiratory: Goal: Ability to maintain a clear airway and adequate ventilation will improve Outcome: Not Progressing   Problem: Role Relationship: Goal: Method of communication will improve Outcome: Not  Progressing

## 2024-11-11 DIAGNOSIS — R4182 Altered mental status, unspecified: Secondary | ICD-10-CM

## 2024-11-11 DIAGNOSIS — R569 Unspecified convulsions: Secondary | ICD-10-CM | POA: Diagnosis not present

## 2024-11-11 LAB — TYPE AND SCREEN
ABO/RH(D): A POS
Antibody Screen: NEGATIVE
Unit division: 0
Unit division: 0
Unit division: 0
Unit division: 0

## 2024-11-11 LAB — RENAL FUNCTION PANEL
Albumin: 2.5 g/dL — ABNORMAL LOW (ref 3.5–5.0)
Anion gap: 15 (ref 5–15)
BUN: 37 mg/dL — ABNORMAL HIGH (ref 8–23)
CO2: 19 mmol/L — ABNORMAL LOW (ref 22–32)
Calcium: 7.3 mg/dL — ABNORMAL LOW (ref 8.9–10.3)
Chloride: 108 mmol/L (ref 98–111)
Creatinine, Ser: 1.32 mg/dL — ABNORMAL HIGH (ref 0.44–1.00)
GFR, Estimated: 44 mL/min — ABNORMAL LOW
Glucose, Bld: 120 mg/dL — ABNORMAL HIGH (ref 70–99)
Phosphorus: 2 mg/dL — ABNORMAL LOW (ref 2.5–4.6)
Potassium: 3.5 mmol/L (ref 3.5–5.1)
Sodium: 142 mmol/L (ref 135–145)

## 2024-11-11 LAB — CBC
HCT: 24.2 % — ABNORMAL LOW (ref 36.0–46.0)
Hemoglobin: 8.1 g/dL — ABNORMAL LOW (ref 12.0–15.0)
MCH: 30.2 pg (ref 26.0–34.0)
MCHC: 33.5 g/dL (ref 30.0–36.0)
MCV: 90.3 fL (ref 80.0–100.0)
Platelets: 36 K/uL — ABNORMAL LOW (ref 150–400)
RBC: 2.68 MIL/uL — ABNORMAL LOW (ref 3.87–5.11)
RDW: 20.8 % — ABNORMAL HIGH (ref 11.5–15.5)
WBC: 6 K/uL (ref 4.0–10.5)
nRBC: 4 % — ABNORMAL HIGH (ref 0.0–0.2)

## 2024-11-11 LAB — BPAM RBC
Blood Product Expiration Date: 202601112359
Blood Product Expiration Date: 202601152359
Blood Product Expiration Date: 202601152359
Blood Product Expiration Date: 202601182359
ISSUE DATE / TIME: 202512161248
ISSUE DATE / TIME: 202512170846
ISSUE DATE / TIME: 202512171051
ISSUE DATE / TIME: 202512190011
Unit Type and Rh: 6200
Unit Type and Rh: 6200
Unit Type and Rh: 6200
Unit Type and Rh: 6200

## 2024-11-11 LAB — MAGNESIUM: Magnesium: 2 mg/dL (ref 1.7–2.4)

## 2024-11-11 LAB — GLUCOSE, CAPILLARY
Glucose-Capillary: 107 mg/dL — ABNORMAL HIGH (ref 70–99)
Glucose-Capillary: 117 mg/dL — ABNORMAL HIGH (ref 70–99)
Glucose-Capillary: 122 mg/dL — ABNORMAL HIGH (ref 70–99)

## 2024-11-11 MED ORDER — ACETAMINOPHEN 325 MG PO TABS: 650.0000 mg | ORAL_TABLET | Freq: Four times a day (QID) | ORAL | Status: DC | PRN

## 2024-11-11 MED ORDER — MORPHINE 100MG IN NS 100ML (1MG/ML) PREMIX INFUSION
INTRAVENOUS | Status: AC
  Administered 2024-11-11: 5 mg/h via INTRAVENOUS
  Filled 2024-11-11: qty 100

## 2024-11-11 MED ORDER — SODIUM CHLORIDE 0.9 % IV SOLN: INTRAVENOUS | Status: DC

## 2024-11-11 MED ORDER — GLYCOPYRROLATE 1 MG PO TABS: 1.0000 mg | ORAL_TABLET | ORAL | Status: DC | PRN

## 2024-11-11 MED ORDER — POLYVINYL ALCOHOL 1.4 % OP SOLN: 1.0000 [drp] | Freq: Four times a day (QID) | OPHTHALMIC | Status: DC | PRN

## 2024-11-11 MED ORDER — MORPHINE 100MG IN NS 100ML (1MG/ML) PREMIX INFUSION: 0.0000 mg/h | INTRAVENOUS | Status: DC

## 2024-11-11 MED ORDER — MORPHINE BOLUS VIA INFUSION
5.0000 mg | INTRAVENOUS | Status: DC | PRN
  Administered 2024-11-11: 5 mg via INTRAVENOUS

## 2024-11-11 MED ORDER — POTASSIUM PHOSPHATES 15 MMOLE/5ML IV SOLN
15.0000 mmol | Freq: Once | INTRAVENOUS | Status: AC
  Administered 2024-11-11: 15 mmol via INTRAVENOUS
  Filled 2024-11-11: qty 5

## 2024-11-11 MED ORDER — GLYCOPYRROLATE 0.2 MG/ML IJ SOLN
0.2000 mg | INTRAMUSCULAR | Status: DC | PRN
  Administered 2024-11-11: 0.2 mg via INTRAVENOUS
  Filled 2024-11-11: qty 1

## 2024-11-11 MED ORDER — ACETAMINOPHEN 650 MG RE SUPP: 650.0000 mg | Freq: Four times a day (QID) | RECTAL | Status: DC | PRN

## 2024-11-11 MED ORDER — GLYCOPYRROLATE 0.2 MG/ML IJ SOLN: 0.2000 mg | INTRAMUSCULAR | Status: DC | PRN

## 2024-11-11 MED ORDER — MIDAZOLAM HCL (PF) 2 MG/2ML IJ SOLN: 2.0000 mg | INTRAMUSCULAR | Status: DC | PRN

## 2024-11-19 ENCOUNTER — Encounter

## 2024-11-20 ENCOUNTER — Ambulatory Visit

## 2024-11-23 NOTE — IPAL (Signed)
" °  Interdisciplinary Goals of Care Family Meeting   Date carried out: 28-Nov-2024  Location of the meeting: Conference room  Member's involved: Physician, Bedside Registered Nurse, Family Member or next of kin, and Palliative care team member    GOALS OF CARE DISCUSSION  The Clinical status was relayed to family in detail-  Updated and notified of patients medical condition- Patient remains unresponsive and will not open eyes to command.   Patient with increased WOB and using accessory muscles to breathe Explained to family course of therapy and the modalities  Patient with Progressive multiorgan failure with a very high probablity of a very minimal chance of meaningful recovery despite all aggressive and optimal medical therapy.    Family understands the situation.  They have consented and agreed to DNR/DNI and would like to proceed with Comfort care measures.  Family are satisfied with Plan of action and management. All questions answered  Additional CC time 35 mins   Kathryn [name], M.D.  [facility]  Medical Director [facility] Medical Director [facility] Cardio-Pulmonary Department     "

## 2024-11-23 NOTE — Progress Notes (Signed)
" °  Chaplain On-Call responded to a page from the Unit Secretary to be with the patient's family at the bedside prior to comfort care being provided.  Chaplain received an update from [employer], and met two sisters of the patient; a brother-in-law, and a niece .  Chaplain encouraged the family to share special thoughts and memories of the patient as they go forward through difficult days ahead.  Chaplain provided prayer and spiritual and emotional support.  Chaplain Bebe Ardean EMERSON Hershal., BCC "

## 2024-11-23 NOTE — Progress Notes (Signed)
 Pt was extubated at 1350 and placed on room air.  Pt is now comfort care

## 2024-11-23 NOTE — Procedures (Signed)
 Patient Name: Kathryn [name]  MRN: [id number]  Epilepsy Attending: Arlin MALVA Krebs  Referring Physician/Provider: Rust-Chester, Jenita CROME, NP  Date: 11/08/2024 Duration: 29.12 mins  Patient history: 69yo F with ams. EEG to evaluate fro seizure  Level of alertness: comatose  AEDs during EEG study: None  Technical aspects: This EEG study was done with scalp electrodes positioned according to the 10-20 International system of electrode placement. Electrical activity was reviewed with band pass filter of 1-70Hz , sensitivity of 7 uV/mm, display speed of 56mm/sec with a 60Hz  notched filter applied as appropriate. EEG data were recorded continuously and digitally stored.  Video monitoring was available and reviewed as appropriate.  Description: EEG showed continuous generalized background attenuation. Hyperventilation and photic stimulation were not performed.     Of note, reports ws delayed as it was assigned to different reading physician   ABNORMALITY -Background attenuation, generalized  IMPRESSION: This study is suggestive of severe to profound diffuse encephalopathy. No seizures or epileptiform discharges were seen throughout the recording.   [name]

## 2024-11-23 NOTE — Plan of Care (Signed)
" °  Problem: Elimination: Goal: Will not experience complications related to bowel motility Outcome: Progressing Goal: Will not experience complications related to urinary retention Outcome: Progressing   Problem: Pain Managment: Goal: General experience of comfort will improve and/or be controlled Outcome: Progressing   Problem: Activity: Goal: Risk for activity intolerance will decrease Outcome: Not Progressing   Problem: Nutrition: Goal: Adequate nutrition will be maintained Outcome: Not Progressing   "

## 2024-11-23 NOTE — Progress Notes (Signed)
 Family is at bedside, per provider patient is transitioning to comfort care.  Family members that desire to present during extubation are currently being gathered and will let this nurse know when ready.

## 2024-11-23 NOTE — Progress Notes (Signed)
 "  NAME:  Kathryn [name], MRN:  [id number], DOB:  [birth year], LOS: 7 ADMISSION DATE:  11/04/2024, CONSULTATION DATE:  11/08/24 REFERRING MD:  Dr. Lawence,   CHIEF COMPLAINT:  Left flank pain, lower abdominal pain Cardiac arrest with brain damage   History of Present Illness:  69 yo F presenting to [facility] ED from home on 11/04/24 for evaluation of left flank pain.  History obtained per chart review as patient is unable to participate in interview at this time. Patient reported that constant, aching, moderate to severe left flank pain and left lower abdominal pain began overnight on 11/03/2024.  She also endorsed nausea with a few episodes of nonbloody vomiting, as well as a dry cough and mild shortness of breath.  She denied chest pain, urinary symptoms, diarrhea, fever or chills.  Her last drink of vodka was on 11/03/2024 and she has been taking her Eliquis  as prescribed.  Patient also at some point described having dark stools but does take iron  pills outpatient.  ED course: Upon arrival patient alert and responsive, in sinus bradycardia and hypertensive but otherwise stable vitals on room air.  Labs significant for mild AGMA, transaminitis, mildly elevated ammonia, leukopenia, chronic anemia and thrombocytopenia.  Imaging revealed pancreatitis, chronic cirrhosis & cholelithiasis.  TRH consulted for admission. Medications given: Morphine , Zofran , IV contrast Initial Vitals: 97.6, 18, 55, 168/91 and 100% on room air  Hospital course:  See significant Hospital events for more details. Overnight 12/16 to 11/08/2024 patient had an abrupt change in mentation and TRH coverage was called bedside.  Per attendings description, her eyes deviated upwards she was unresponsive rigid and posturing in her bilateral upper extremities with tremors in 1 leg.  She received 1 dose of Ativan  that did not stop this activity, the activity stopped on its own after a few minutes.  During the same event she had had a moderate  episode of melena with correlating hypotension.  There was concern the patient had lost her pulse and [facility] team was called with CPR initiated.  But patient quickly reached up and CPR stopped.  However due to persistent encephalopathy patient was intubated and placed on mechanical ventilatory support for airway protection and transferred to the ICU on vasopressors due to hypotension.   CXR 11/08/24: no acute cardiopulmonary process CT abdomen and pelvis with contrast 11/04/2024:  Profound hepatic steatosis with nodular contours of the liver, suggestive of cirrhosis. There is sequela of portal hypertension including small volume ascites, multiple abdominal collaterals, and favored portal colopathy. 2. Cholelithiasis with similar circumferential wall prominence of the gallbladder compared to priors. This is favored to reflect sequela of underlying liver disease. If there is clinical concern for acute cholecystitis, recommend dedicated RIGHT upper quadrant ultrasound. 3. Nonspecific fat stranding throughout the epigastric area. This could reflect sequela of portal  hypertension, pancreatitis or sequela of duodenitis. Recommend correlation with lipase levels. 4. There is wall thickening of the stomach, duodenum and colon. This is nonspecific and could reflect a nonspecific enteritis or sequela of liver disease. 5. There are several small hypoenhancing areas along the pancreatic body and tail measuring approximately 5 mm. These are nonspecific and could reflect small pseudocysts or side branch IPMNs. Recommend further evaluation with nonemergent outpatient pancreatic protocol MRI with and without contrast. 6. There is heterogeneous expansion of the endometrial canal spanning approximately 12 mm. Recommend further evaluation with pelvic ultrasound when clinically appropriate. Aortic Atherosclerosis  US  abdomen limited RUQ 11/04/2024:  Gallstones in the neck of the gallbladder  with borderline wall thickening  and cholecystic fluid. Negative sonographic murphy sign. Correlate clinically for acute cholecystitis. 2. Nodular liver contour with increased, coarsened echotexture, compatible with cirrhosis; evaluation limited by increased echogenicity. 3. Small amount of free fluid in the right upper quadrant. MRI severe global anoxic brain injury  PCCM consulted for assistance in management and monitoring due to hypovolemic shock suspect secondary to acute blood loss anemia, acute hypoxic respiratory failure requiring intubation and mechanical ventilatory support & suspected new onset seizure activity.  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  Attempted MRCP but patient noncooperative and had to be aborted.  Overnight mentation changed suddenly, suspected new onset seizure activity and circulatory shock requiring intubation and mechanical ventilatory support, vasopressors and transferred to ICU. 11/08/24: Remains critically ill requiring mechanical ventilation and vasopressor support. Head CT pending. General surgery, GI and Neuro following. 11/09/24: Remains critically ill requiring mechanical ventilation and vasopressor support. Not currently on sedation but obtunded. MRI pending. GI, Gen Surg and Neuro following. 12/19 active bleeding and severe brain  damage 11/23/2024 plan for CMO, brain damage  Interim History / Subjective:  Remains critically ill Remains intubated Severe Brain Damage  Objective    Blood pressure 136/75, pulse (!) 55, temperature 97.9 F (36.6 C), temperature source Rectal, resp. rate 19, height 5' 4 (1.626 m), weight 98.2 kg, SpO2 100%.    Vent Mode: PRVC FiO2 (%):  [28 %] 28 % Set Rate:  [15 bmp] 15 bmp Vt Set:  [450 mL] 450 mL PEEP:  [5 cmH20] 5 cmH20   Intake/Output Summary (Last 24 hours) at 2024-11-23 0754 Last data filed at 11-23-24 0400 Gross per 24 hour  Intake 1482.9 ml  Output 450 ml  Net 1032.9 ml   Filed Weights   11/09/24 0451 11/10/24 0500 23-Nov-2024 0500  Weight: 89.2 kg 96 kg 98.2 kg      REVIEW OF SYSTEMS  PATIENT IS UNABLE TO PROVIDE COMPLETE REVIEW OF SYSTEMS DUE TO SEVERE CRITICAL ILLNESS   PHYSICAL EXAMINATION:  GENERAL:critically ill appearing, +resp distress EYES: Pupils equal, round, reactive to light.  No scleral icterus.  MOUTH: Moist mucosal membrane. INTUBATED NECK: Supple.  PULMONARY: Lungs clear to auscultation, +rhonchi, +wheezing CARDIOVASCULAR: S1 and S2.  Regular rate and rhythm GASTROINTESTINAL: Soft, nontender, -distended. Positive bowel sounds.  MUSCULOSKELETAL: No swelling, clubbing, or edema.  NEUROLOGIC: obtunded,sedated SKIN:normal, warm to touch, Capillary refill delayed  Pulses present bilaterally      Assessment and Plan   69 yo AAF with severe Acute Metabolic Encephalopathy Diffuse Encephalopathy s/t Anoxic Brain Injury due to  Cardiac Arrest EtOH Abuse and GIB severe ABLA  NEUROLOGY-SEVERE BRAIN DAMAGE ACUTE METABOLIC ENCEPHALOPATHY 11/09/24 Brain MRI: Restricted diffusion throughout the cerebral cortex bilaterally, with relative sparing of the precentral and postcentral gyri, occipital lobes, mesial temporal lobes, cerebellum, and brainstem, compatible with a global anoxic/hypoxic insult. Mild blooming artifact within the globus pallidus  bilaterally. Plan for one way extubation and comfort care measures   Hemorrhagic Shock s/t ABLA iso Acute  GIB Chronic / Paroxysmal Atrial Fibrillation  Chronic HFpEF PMHx: HTN, HLD    GI Acute Liver Failure Alcoholic Pancreatitis ~ Improving Chronic Alcoholic Cirrhosis Esophageal Varices Portal hypertension and portal vein thrombosis Elevated INR FAMILY REFUSED EGD Child pugh score 82% mortality alone - GI ppx: Protonix  - Diet: NPO  ELECTROLYTES -follow labs as needed -replace as needed -pharmacy consultation and following      Labs   CBC: Recent Labs  Lab 11/07/24 0331 11/07/24 2230 11/08/24 0556 11/08/24 1335 11/09/24 0414 11/09/24 0506 11/10/24 0421 11/10/24 1101 11/10/24 1727 11/10/24 2200 11/25/2024 0410  WBC 4.6  --  8.1  --  8.2  --  7.6  --   --   --  6.0  HGB 6.0*   < > 5.5*   < > 8.1*   < > 8.0* 7.4* 7.5* 8.0* 8.1*  HCT 18.3*   < > 16.5*   < > 23.5*   < > 22.9* 21.3* 21.6* 23.5* 24.2*  MCV 92.9  --  91.7  --  89.0  --  88.1  --   --   --  90.3  PLT 69*  --  92*  --  72*  --  48*  --   --   --  36*   < > = values in this interval not displayed.    Basic Metabolic Panel: Recent Labs  Lab 11/04/24 1940 11/05/24 0745 11/08/24 0556 11/08/24 1335 11/09/24 0414 11/10/24 0421 2024-11-25 0410  NA  --    < > 143 143 142 140 142  K  --    < > 3.6 4.1 3.7 3.1* 3.5  CL  --    < > 106 107 107 106 108  CO2  --    < > 16* 17* 20* 18* 19*  GLUCOSE  --    < > 164* 186* 140* 149* 120*  BUN  --    < > 30* 31* 37* 40* 37*  CREATININE  --    < > 1.80* 1.87* 1.82* 1.53* 1.32*  CALCIUM   --    < > 7.6* 8.3* 7.6* 7.2* 7.3*  MG 2.0  --  1.9  --  2.1 1.9 2.0  PHOS 3.7  --  2.2*  --  2.1* 2.9 2.0*   < > = values in this interval not displayed.   GFR: Estimated Creatinine Clearance: 46.4 mL/min (A) (by C-G formula based on SCr of 1.32 mg/dL (H)). Recent Labs  Lab 11/08/24 0522 11/08/24 0556 11/08/24 0837 11/08/24 2334 11/09/24 0217 11/09/24 0414  11/09/24 0506 11/09/24 2058 11/10/24 0421 2024/11/25 0410  PROCALCITON 5.34  --   --   --   --   --   --   --   --   --   WBC  --  8.1  --   --   --  8.2  --   --  7.6 6.0  LATICACIDVEN  --  >9.0*   < > 5.0* 4.9*  --  5.2* 6.1*  --   --    < > = values in this interval not displayed.    Liver Function Tests: Recent Labs  Lab 11/05/24 0745 11/06/24 0436 11/07/24 0331 11/08/24 0556 11/09/24 0414 11/10/24 0421 11-25-2024 0410  AST 269* 211* 321* 187*  --  90*  --   ALT 52* 42 51* 44  --  33  --   ALKPHOS 208* 176* 236* 150*  --  106  --   BILITOT  2.5* 2.2* 3.2* 2.9*  --  2.0*  --   PROT 7.2 6.4* 5.8* 4.9*  --  4.6*  --   ALBUMIN 3.5 3.0* 2.9* 2.5* 2.6* 2.3*  2.3* 2.5*   Recent Labs  Lab 11/04/24 1208 11/04/24 1940 11/08/24 0522  LIPASE 637* 434* 60*   Recent Labs  Lab 11/04/24 1940 11/08/24 0556  AMMONIA 43* 243*    ABG    Component Value Date/Time   PHART 7.34 (L) 11/08/2024 0530   PCO2ART 28 (L) 11/08/2024 0530   PO2ART 427 (H) 11/08/2024 0530   HCO3 15.1 (L) 11/08/2024 0530   ACIDBASEDEF 9.2 (H) 11/08/2024 0530   O2SAT 100 11/08/2024 0530     Coagulation Profile: Recent Labs  Lab 11/04/24 1940 11/07/24 1026 11/08/24 0522 11/09/24 0414  INR 1.5* 2.7* 3.4* 2.2*    Cardiac Enzymes: Recent Labs  Lab 11/09/24 0414  CKTOTAL 60    HbA1C: Hgb A1c MFr Bld  Date/Time Value Ref Range Status  07/27/2023 11:05 AM 5.6 4.8 - 5.6 % Final    Comment:    (NOTE) Pre diabetes:          5.7%-6.4%  Diabetes:              >6.4%  Glycemic control for   <7.0% adults with diabetes   06/24/2021 04:30 PM 5.3 4.8 - 5.6 % Final    Comment:    (NOTE) Pre diabetes:          5.7%-6.4%  Diabetes:              >6.4%  Glycemic control for   <7.0% adults with diabetes     CBG: Recent Labs  Lab 11/10/24 1133 11/10/24 1542 11/10/24 1909 11/10/24 2310 12-08-2024 0306  GLUCAP 134* 109* 119* 122* 107*    Review of Systems:   UTA- patient unresponsive on  mechanical ventilation at this time  Past Medical History:  She,  has a past medical history of Alcoholic cirrhosis of liver (HCC), Asthma, CHF (congestive heart failure) (HCC), Chronic disease anemia, Esophageal varices (HCC), ETOH abuse, Hypertension, Hypothyroidism, Murmur, Portal hypertension (HCC), and Sarcoidosis.   Surgical History:   Past Surgical History:  Procedure Laterality Date   COLONOSCOPY N/A 07/29/2024   Procedure: COLONOSCOPY;  Surgeon: Maryruth Ole DASEN, MD;  Location: [facility] ENDOSCOPY;  Service: Endoscopy;  Laterality: N/A;   ESOPHAGOGASTRODUODENOSCOPY N/A 06/26/2021   Procedure: ESOPHAGOGASTRODUODENOSCOPY (EGD);  Surgeon: Toledo, Ladell POUR, MD;  Location: [facility] ENDOSCOPY;  Service: Gastroenterology;  Laterality: N/A;   ESOPHAGOGASTRODUODENOSCOPY N/A 07/29/2024   Procedure: EGD (ESOPHAGOGASTRODUODENOSCOPY);  Surgeon: Maryruth Ole DASEN, MD;  Location: [facility] ENDOSCOPY;  Service: Endoscopy;  Laterality: N/A;   ESOPHAGOGASTRODUODENOSCOPY (EGD) WITH PROPOFOL  N/A 04/04/2015   Procedure: ESOPHAGOGASTRODUODENOSCOPY (EGD) WITH PROPOFOL ;  Surgeon: Rogelia Copping, MD;  Location: [facility] ENDOSCOPY;  Service: Endoscopy;  Laterality: N/A;  Dr COPPING prefers 1445   ESOPHAGOGASTRODUODENOSCOPY (EGD) WITH PROPOFOL  N/A 12/04/2022   Procedure: ESOPHAGOGASTRODUODENOSCOPY (EGD) WITH PROPOFOL ;  Surgeon: Therisa Bi, MD;  Location: [facility] ENDOSCOPY;  Service: Gastroenterology;  Laterality: N/A;   ESOPHAGOGASTRODUODENOSCOPY W/ BANDING  09/2014   [place]in   LEFT HEART CATH AND CORONARY ANGIOGRAPHY N/A 02/21/2024   Procedure: LEFT HEART CATH AND CORONARY ANGIOGRAPHY;  Surgeon: Mady Bruckner, MD;  Location: [facility] INVASIVE CV LAB;  Service: Cardiovascular;  Laterality: N/A;   TUBAL LIGATION       Social History:   reports that she has never smoked. She has never used smokeless tobacco. She reports current alcohol  use of about  15.0 standard drinks of alcohol  per week. She reports that she does not use drugs.   Family  History:  Her family history includes Aneurysm in her father; COPD in her father; Cerebral aneurysm in her father; Heart attack in her mother; Hypertension in her brother, mother, and sister.   Allergies Allergies[1]   Home Medications  Prior to Admission medications  Medication Sig Start Date End Date Taking? Authorizing Provider  albuterol  (VENTOLIN  HFA) 108 (90 Base) MCG/ACT inhaler Inhale 1-2 puffs into the lungs every 4 (four) hours as needed for wheezing or shortness of breath. 06/14/23  Yes [provider]  apixaban  (ELIQUIS ) 5 MG TABS tablet Take 1 tablet (5 mg total) by mouth 2 (two) times daily. 02/22/24  Yes Lenon Marien CROME, MD  ferrous sulfate  325 (65 FE) MG tablet Take 1 tablet (325 mg total) by mouth daily with breakfast. 08/03/24  Yes Djan, Drue DASEN, MD  folic acid  (FOLVITE ) 1 MG tablet Take 1 tablet (1 mg total) by mouth daily. 08/03/24  Yes Dorinda Drue DASEN, MD  levothyroxine  (SYNTHROID ) 112 MCG tablet 1 tablet in the morning on an empty stomach Orally Once a day for 90 days 04/26/24  Yes [provider]  Multiple Vitamin (MULTIVITAMIN) tablet Take 1 tablet by mouth every other day.   Yes [provider]  nadolol  (CORGARD ) 80 MG tablet Take by mouth daily. For 90 days   Yes [provider]  pantoprazole  (PROTONIX ) 40 MG tablet Take 1 tablet (40 mg total) by mouth daily. 08/03/24  Yes Djan, Drue DASEN, MD  senna-docusate (SENOKOT-S) 8.6-50 MG tablet Take 1 tablet by mouth at bedtime as needed for mild constipation. 08/02/24  Yes Dorinda Drue DASEN, MD  atorvastatin  (LIPITOR) 10 MG tablet Take 1 tablet (10 mg total) by mouth daily. Patient not taking: Reported on 07/27/2024 02/23/24   Lenon Marien CROME, MD  Blood Glucose Monitoring Suppl (BLOOD GLUCOSE MONITOR SYSTEM) w/Device KIT as directed as directed daily for 30 days 06/08/24   [provider]  thiamine  (VITAMIN B-1) 100 MG tablet Take 1 tablet (100 mg total) by mouth daily. 02/23/24   Lenon Marien CROME, MD       OVERALL POOR PROGNOSIS END STAGE LIVER DISEASE WITH CARDIAC ARREST AND SEVERE BRAIN DAMAGE, ACTIVE GIB COMFORT CARE MEASURES TODAY WHEN FAMILY IS READY PATIENT WILL NOT SURVIVE THIS ADMISSION   Critical Care Time devoted to patient care services described in this note is 45 minutes.  Critical care was necessary to treat /prevent imminent and life-threatening deterioration. Overall, patient is critically ill, prognosis is guarded.  Patient with Multiorgan failure and at high risk for cardiac arrest and death.    Nickolas Alm Cellar, M.D.  [facility]  Medical Director [facility]              [1]  Allergies Allergen Reactions   Oxycodone -Acetaminophen     Emetrol     Other Reaction(s): critical   Hydrocodone-Acetaminophen  Other (See Comments)    Reaction: pt can't take anything with tylenol  due to her liver disease.    Ibuprofen Other (See Comments)    Esophageal varices   Naproxen Other (See Comments)    Esophageal varices   Oseltamivir     Other Reaction(s): critical   Propoxyphene Other (See Comments)    Reaction: pt isn't sure, but knows she can't take it.    Shellfish Allergy Hives   Sodium Ferric Gluconate [Ferrous Gluconate]     IV  IRON    Tamiflu  [Oseltamivir Phosphate] Other (See Comments)    Esophageal varices   "

## 2024-11-23 NOTE — Progress Notes (Signed)
 Family took patient's belongings and updated on funeral home of choice.

## 2024-11-23 NOTE — Progress Notes (Signed)
 Patient on comfort care measures, family at bedside.

## 2024-11-23 NOTE — Progress Notes (Signed)
 NEUROLOGY CONSULT FOLLOW UP NOTE   Date of service: 11/09/24 Patient Name: Kathryn [name] MRN:  [id number] DOB:  22-Feb-1956  Interval Hx/subjective   Exam unchanged off sedation: sluggish pupils, some brainstem reflexes, no response to noxious stimuli (deep sternal rub or in any extremity) except for decorticate posturing in the LUE   MRI brain showed e/o significant global anoxic injury w/ restricted diffusion throughout the cerebral cortex bilaterally, with relative sparing of the precentral and postcentral gyri, occipital lobes, mesial temporal lobes, cerebellum, and brainstem, compatible with a global anoxic/hypoxic insult  Large family meeting held today with myself, palliative, and 24 family members in the [place] regarding GOC. Please see below for detailed account of that meeting.  Vitals   Vitals:   2024/11/20 1500 2024-11-20 1600 11/20/24 1700 November 20, 2024 1705  BP:      Pulse: (!) 102 (!) 52    Resp: (!) 7 (!) 8 (!) 0 (!) 0  Temp:      TempSrc:      SpO2: (!) 65% (!) 67%    [name]:      Height:         Body mass index is 37.16 kg/m.  Physical Exam   Gen: patient lying in bed, comatose, not on sedation CV: extremities appear well-perfused Resp: ventilated   Neurologic exam MS: comatose, not on sedation Speech: intubated CN: 3mm sluggish but reactive bilaterally, (+) corneals, cough (-) oculocephalics, gagt Motor & sensory: LUE decorticate posturing in response to distal noxious stimuli or deep sternal rub, no response to noxious stimuli in any other extremity  Medications Current Medications[1]  Labs and Diagnostic Imaging   CBC: No results for input(s): WBC, NEUTROABS, HGB, HCT, MCV, PLT in the last 168 hours.  Basic Metabolic Panel:  Lab Results  Component Value Date   NA 142 2024/11/20   K 3.5 20-Nov-2024   CO2 19 (L) 11/20/2024   GLUCOSE 120 (H) 2024/11/20   BUN 37 (H) Nov 20, 2024   CREATININE 1.32 (H) 11-20-2024   CALCIUM  7.3 (L) 11/20/2024    GFRNONAA 44 (L) 2024/11/20   GFRAA 48 (L) 04/14/2020   Lipid Panel:  Lab Results  Component Value Date   LDLCALC 53 04/10/2024   HgbA1c:  Lab Results  Component Value Date   HGBA1C 5.6 07/27/2023   Urine Drug Screen:     Component Value Date/Time   LABOPIA NONE DETECTED 04/06/2022 2054   COCAINSCRNUR NONE DETECTED 04/06/2022 2054   LABBENZ NONE DETECTED 04/06/2022 2054   AMPHETMU NONE DETECTED 04/06/2022 2054   THCU NONE DETECTED 04/06/2022 2054   LABBARB NONE DETECTED 04/06/2022 2054    Alcohol  Level     Component Value Date/Time   ETH 219 (H) 02/17/2024 2331   INR  Lab Results  Component Value Date   INR 2.2 (H) 11/09/2024   APTT  Lab Results  Component Value Date   APTT >200 (HH) 11/05/2024   AED levels: No results found for: PHENYTOIN, ZONISAMIDE, LAMOTRIGINE, LEVETIRACETA  CT Head without contrast(Personally reviewed): No acute process   MRI Brain(Personally reviewed): 1. Restricted diffusion throughout the cerebral cortex bilaterally, with relative sparing of the precentral and postcentral gyri, occipital lobes, mesial temporal lobes, cerebellum, and brainstem, compatible with a global anoxic/hypoxic insult. Findings discussed with Dr. Matthews at approximately 10:15 AM on 11/09/2024. 2. Mild blooming artifact within the globus pallidus bilaterally.   Neurodiagnostics rEEG:  While comatose and off sedation, diffuse suppression, minimal runs of what appears to be beta activity lasting <1 sec  Assessment   Kathryn [name] is a 69 y.o. female with hx of alcohol  abuse complicated by liver cirrhosis, esophageal varices, portal vein hypertension and portal vein thrombosis, A-fib on Eliquis , diastolic heart failure, hypertension, hyperlipidemia, sarcoidosis, iron  deficiency anemia who presented with left flank pain and was found to have acute pancreatitis on November 04, 2024.  This was thought to be secondary to alcohol  use. Over the next few days she  developed worsening liver failure Child-Pugh C, score 11 (mortality 82%).    2 nights ago her mental status changed suddenly, then passed a large amount of melena and had what appeared to be a seizure. She was briefly thought to have lost pulses and started chest compressions but extended her arm during first round of CPR and they were able to find pulses after that.  She was found to be in circulatory shock requiring intubation and mechanical ventilator support, vasopressors and was transferred to the ICU.    She is comatose despite no sedation and has an extremely poor neurologic exam and also an EEG that is almost completely suppressed. Unfortunately MRI brain performed today showed that she has suffered significant diffuse anoxic brain injury.  Large family meeting held today with myself, palliative, and 24 family members in the [place]. Daughter Kathryn [name] was present who is the runner, broadcasting/film/video.  [employer] and myself discussed her status including her imaging, testing, and terminal prognosis.  We discussed the fact that she was extremely ill with a very poor prognosis based on her liver failure alone and that she has been upgraded to a child Pugh C, score 11 (mortality 82%).  That was prior to her GI bleed, seizure, probable cardiac arrest.  I explained that the imaging findings on the MRI showed that there was significant irreversible hypoxic brain damage to multiple parts of the brain including the cortex which forms her personality and her ability to engage with her family and experience life, her deeper motor nuclei which are responsible for movement, and her brainstem which is responsible for keeping her alive off life support.    Kathryn [name] was very distressed and I walked her, her partner, and her son back to the patient's bedside.  I showed them the actual MRI images and explained where the damage was and that it was on a reversible.  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  This continued despite initiating propofol , Versed , and loading with Keppra .  Eventually by uptitrating the anesthesia we were able to stop the seizure.  I recommended to CCM that that we continue the patient on sedation for family comfort to avoid further convulsive seizures which may be upsetting but I did reinforce to Kathryn [name] and her family that unfortunately the seizures are a result of her brain damage not a cause and stopping them will not make her better.  Recommendations   At this point family would like some time to think things over and talk amongst themselves. Neurology will be available prn if we can be of any further assistance in GOC conversations or in any other way; please do not hesitate to re-engage us . ______________________________________________________________________   Signed, Elida CHRISTELLA Ross, MD [facility]     [1] No current facility-administered medications for this encounter.  Current Outpatient Medications:    albuterol  (VENTOLIN  HFA) 108 (90 Base) MCG/ACT inhaler, Inhale 1-2 puffs into the lungs every 4 (four) hours as needed for wheezing or shortness of breath., Disp: , Rfl:  levothyroxine  (SYNTHROID ) 112 MCG tablet, 1 tablet in the morning on an empty stomach Orally Once a day for 90 days, Disp: , Rfl:    Multiple Vitamin (MULTIVITAMIN) tablet, Take 1 tablet by mouth every other day., Disp: , Rfl:    nadolol  (CORGARD ) 80 MG tablet, Take by mouth daily. For 90 days, Disp: , Rfl:    Blood Glucose Monitoring Suppl (BLOOD GLUCOSE MONITOR SYSTEM) w/Device KIT, as directed as directed daily for 30 days, Disp: , Rfl:

## 2024-11-23 NOTE — Progress Notes (Signed)
 Vent check was completed at 0745 on the documented settings with no changes being made to the vent.  Mouth care was completed suctioning for a copious amount of thick tan/bloody secretions.  ETT was moved to the pt's anatomical left side of the mouth.  Suctioned ETT for nothing.  Bag and mask is at the [facility] and hooked up to the 02 flowmeter.  Reading progress notes from palliative care yesterday, pt is to be terminally extubated today.  Pt is in no respiratory distress at this time

## 2024-11-23 NOTE — Death Summary Note (Signed)
 " DEATH SUMMARY   Patient Details  Name: MCKINNA DEMARS MRN: [id number] DOB: December 11, 1955  Admission/Discharge Information   Admit Date:  2024-11-24  Date of Death: Date of Death: 12-01-2024  Time of Death: Time of Death: 12-10-04  Length of Stay: 7  Referring Physician: Odell Chard, Edra GRADE, MD   Reason(s) for Hospitalization  GIB, cardiac arrest, anoxic brain damage  Diagnoses  Preliminary cause of death: anoxic brain damage, ischemic cardiomyopathy Secondary Diagnoses (including complications and co-morbidities):  Principal Problem:   Acute gallstone pancreatitis Active Problems:   Hypothyroidism   Hypertension   Asthma   CKD (chronic kidney disease), stage IIIa   Pancytopenia (HCC)   Portal vein thrombosis   Alcohol  use disorder   Alcoholic cirrhosis of liver (HCC)   Hyperlipemia   Alcoholic pancreatitis   Atrial fibrillation, chronic (HCC)   Chronic diastolic CHF (congestive heart failure) (HCC)   Pancreatic cyst   COPD (chronic obstructive pulmonary disease) (HCC)   Myocardial injury   Shock circulatory [facility])   Melena   Brief Hospital Course (including significant findings, care, treatment, and services provided and events leading to death)    CHIEF COMPLAINT:  Left flank pain, lower abdominal pain Cardiac arrest with brain damage    History of Present Illness:  69 yo F presenting to [facility] ED from home on 24-Nov-2024 for evaluation of left flank pain.   History obtained per chart review as patient is unable to participate in interview at this time. Patient reported that constant, aching, moderate to severe left flank pain and left lower abdominal pain began overnight on 11/03/2024.  She also endorsed nausea with a few episodes of nonbloody vomiting, as well as a dry cough and mild shortness of breath.  She denied chest pain, urinary symptoms, diarrhea, fever or chills.  Her last drink of vodka was on 11/03/2024 and she has been taking her Eliquis  as prescribed.  Patient  also at some point described having dark stools but does take iron  pills outpatient.   ED course: Upon arrival patient alert and responsive, in sinus bradycardia and hypertensive but otherwise stable vitals on room air.  Labs significant for mild AGMA, transaminitis, mildly elevated ammonia, leukopenia, chronic anemia and thrombocytopenia.  Imaging revealed pancreatitis, chronic cirrhosis & cholelithiasis.  TRH consulted for admission. Medications given: Morphine , Zofran , IV contrast Initial Vitals: 97.6, 18, 55, 168/91 and 100% on room air   Hospital course:  See significant Hospital events for more details. Overnight 12/16 to 11/08/2024 patient had an abrupt change in mentation and TRH coverage was called bedside.  Per attendings description, her eyes deviated upwards she was unresponsive rigid and posturing in her bilateral upper extremities with tremors in 1 leg.  She received 1 dose of Ativan  that did not stop this activity, the activity stopped on its own after a few minutes.  During the same event she had had a moderate episode of melena with correlating hypotension.  There was concern the patient had lost her pulse and [facility] team was called with CPR initiated.  But patient quickly reached up and CPR stopped.  However due to persistent encephalopathy patient was intubated and placed on mechanical ventilatory support for airway protection and transferred to the ICU on vasopressors due to hypotension.     CXR 11/08/24: no acute cardiopulmonary process CT abdomen and pelvis with contrast 11-24-24:  Profound hepatic steatosis with nodular contours of the liver, suggestive of cirrhosis. There is sequela of portal hypertension including small volume ascites,  multiple abdominal collaterals, and favored portal colopathy. 2. Cholelithiasis with similar circumferential wall prominence of the gallbladder compared to priors. This is favored to reflect sequela of underlying liver disease. If there is  clinical concern for acute cholecystitis, recommend dedicated RIGHT upper quadrant ultrasound. 3. Nonspecific fat stranding throughout the epigastric area. This could reflect sequela of portal  hypertension, pancreatitis or sequela of duodenitis. Recommend correlation with lipase levels. 4. There is wall thickening of the stomach, duodenum and colon. This is nonspecific and could reflect a nonspecific enteritis or sequela of liver disease. 5. There are several small hypoenhancing areas along the pancreatic body and tail measuring approximately 5 mm. These are nonspecific and could reflect small pseudocysts or side branch IPMNs. Recommend further evaluation with nonemergent outpatient pancreatic protocol MRI with and without contrast. 6. There is heterogeneous expansion of the endometrial canal spanning approximately 12 mm. Recommend further evaluation with pelvic ultrasound when clinically appropriate. Aortic Atherosclerosis  US  abdomen limited RUQ 11/04/2024:  Gallstones in the neck of the gallbladder with borderline wall thickening and cholecystic fluid. Negative sonographic murphy sign. Correlate clinically for acute cholecystitis. 2. Nodular liver contour with increased, coarsened echotexture, compatible with cirrhosis; evaluation limited by increased echogenicity. 3. Small amount of free fluid in the right upper quadrant. MRI severe global anoxic brain injury   PCCM consulted for assistance in management and monitoring due to hypovolemic shock suspect secondary to acute blood loss anemia, acute hypoxic respiratory failure requiring intubation and mechanical ventilatory support & suspected new onset seizure activity.   Pertinent  Medical History  Alcoholic liver cirrhosis EtOH abuse Esophageal Varices Portal vein hypertension & portal vein thrombosis Atrial Fibrillation on Eliquis  HFpEF Hypertension Hyperlipidemia Colitis Sarcoidosis Iron  deficiency anemia Asthma & COPD GIB   Significant  Hospital Events: Including procedures, antibiotic start and stop dates in addition to other pertinent events   11/04/2024: Admit to inpatient with acute pancreatitis 11/05/2024: General Surgery consulted, medical management and optimization continued-lipase improving 11/06/2024: Pancreatitis suspected to be secondary to alcohol  use 11/07/24: Worsening liver failure Child-Pugh C, score 11 with mortality of 82%.  Attempted MRCP but patient noncooperative and had to be aborted.  Overnight mentation changed suddenly, suspected new onset seizure activity and circulatory shock requiring intubation and mechanical ventilatory support, vasopressors and transferred to ICU. 11/08/24: Remains critically ill requiring mechanical ventilation and vasopressor support. Head CT pending. General surgery, GI and Neuro following. 11/09/24: Remains critically ill requiring mechanical ventilation and vasopressor support. Not currently on sedation but obtunded. MRI pending. GI, Gen Surg and Neuro following. 12/19 active bleeding and severe brain damage 23-Nov-2024 plan for CMO, brain damage     GOALS OF CARE DISCUSSION   The Clinical status was relayed to family in detail-   Updated and notified of patients medical condition- Patient remains unresponsive and will not open eyes to command.   Patient with increased WOB and using accessory muscles to breathe Explained to family course of therapy and the modalities  Patient with Progressive multiorgan failure with a very high probablity of a very minimal chance of meaningful recovery despite all aggressive and optimal medical therapy.      Family understands the situation.   They have consented and agreed to DNR/DNI and would like to proceed with Comfort care measures.   Family are satisfied with Plan of action and management. All questions answered    Pertinent Labs and Studies  Significant Diagnostic Studies MR BRAIN WO CONTRAST Result Date: 11/09/2024 EXAM: MRI  BRAIN WITHOUT CONTRAST 11/09/2024  09:57:00 AM TECHNIQUE: Multiplanar multisequence MRI of the head/brain was performed without the administration of intravenous contrast. COMPARISON: CT of the head dated 11/08/2024. CLINICAL HISTORY: Stroke, follow up. FINDINGS: BRAIN AND VENTRICLES: There is restricted diffusion present throughout the cerebral cortex bilaterally, with relative sparing of the precentral and postcentral gyri. There is also sparing of the occipital lobes and mesial temporal lobes, as well as the cerebellum and brainstem. The findings are compatible with a global anoxic/hypoxic insult. There is mild blooming artifact present within the globus pallidus bilaterally. No mass. No midline shift. No hydrocephalus. The sella is unremarkable. Normal flow voids. The above findings were discussed with Dr. Matthews at approximately 10:15 am 11/09/2024. ORBITS: No acute abnormality. SINUSES AND MASTOIDS: No acute abnormality. BONES AND SOFT TISSUES: Normal marrow signal. No acute soft tissue abnormality. IMPRESSION: 1. Restricted diffusion throughout the cerebral cortex bilaterally, with relative sparing of the precentral and postcentral gyri, occipital lobes, mesial temporal lobes, cerebellum, and brainstem, compatible with a global anoxic/hypoxic insult. Findings discussed with Dr. Matthews at approximately 10:15 AM on 11/09/2024. 2. Mild blooming artifact within the globus pallidus bilaterally. Electronically signed by: Evalene Coho MD 11/09/2024 10:21 AM EST RP Workstation: [id number]   CT HEAD WO CONTRAST ( ) Result Date: 11/08/2024 EXAM: CT HEAD WITHOUT CONTRAST 11/08/2024 02:28:47 PM TECHNIQUE: CT of the head was performed without the administration of intravenous contrast. Automated exposure control, iterative reconstruction, and/or weight based adjustment of the mA/kV was utilized to reduce the radiation dose to as low as reasonably achievable. COMPARISON: 11/19/2023 CLINICAL HISTORY: Mental status  change, unknown cause FINDINGS: BRAIN AND VENTRICLES: No acute hemorrhage. No evidence of acute infarct. No hydrocephalus. No extra-axial collection. No mass effect or midline shift. Unchanged cerebellar calcifications. ORBITS: No acute abnormality. SINUSES: Mild paranasal sinus mucosal thickening. Small left mastoid effusion, unchanged. SOFT TISSUES AND SKULL: No acute soft tissue abnormality. No skull fracture. IMPRESSION: 1. No acute intracranial abnormality or significant interval change . Electronically signed by: Lonni Necessary MD 11/08/2024 02:49 PM EST RP Workstation: [id number]   MR 3D Recon At Scanner Result Date: 11/08/2024 EXAM: MRCP WITHOUT IV CONTRAST 11/07/2024 07:50:17 PM TECHNIQUE: Multisequence, multiplanar magnetic resonance images of the abdomen without intravenous contrast. MRCP sequences were performed. COMPARISON: MR Abdomen 05/17/2013, CT 11/04/2024. CLINICAL HISTORY: CT scan and US -RUQ showed gallstone. No common duct dilation by ultrasound-RUQ. Alcohol  use disorder. FINDINGS: LIMITATIONS/ARTIFACTS: Best obtainable images. Patient unable to tolerate full exam and refused part way through. There is significant motion degradation of the exam. Exam is somewhat limited by patient body motion. LIVER: Severe Hepatic steatosis noted on the opposed phase imaging (series 5). No intrahepatic biliary duct dilatation. GALLBLADDER AND BILIARY SYSTEM: There is a 5 mm gallstone towards the neck of the gallbladder (image 23 of series 4). No gallbladder distention. Small amount of pericholecystic fluid. The common bile duct appears normal caliber. No evidence of filling defect within the distal common bile duct. No intrahepatic biliary duct dilatation. SPLEEN: Unremarkable. PANCREAS/PANCREATIC DUCT: Visualized pancreas is unremarkable. No pancreatic ductal dilatation. ADRENAL GLANDS: Unremarkable. KIDNEYS: Unremarkable. LYMPH NODES: No enlarged abdominal lymph nodes. VASCULATURE: Unremarkable.  PERITONEUM: Moderate intraperitoneal free fluid throughout the upper abdomen along the liver and spleen. Small amount of pericholecystic fluid. ABDOMINAL WALL: No hernia. No mass. BOWEL: Suspicion of submucosal edema within the stomach. Potential diffuse gastritis. No bowel obstruction. No outlet obstruction. BONES: No acute abnormality or worrisome osseous lesion. SOFT TISSUES: Unremarkable. MISCELLANEOUS: Unremarkable. IMPRESSION: 1. Cholelithiasis with a 5 mm stone at the gallbladder neck  and small pericholecystic fluid, without gallbladder distention or biliary ductal dilatation; no choledocholithiasis. 2. Severe hepatic steatosis. 3. Suspected diffuse gastritis with submucosal gastric edema. 4. Small to moderate intraperitoneal free fluid in the upper abdomen. 5. No MR evidence of pancreatitis. 6. Exam limited by significant motion artifact and early termination. Electronically signed by: Norleen Boxer MD 11/08/2024 12:37 PM EST RP Workstation: [id number]   MR ABDOMEN MRCP WO CONTRAST Result Date: 11/08/2024 EXAM: MRCP WITHOUT IV CONTRAST 11/07/2024 07:50:17 PM TECHNIQUE: Multisequence, multiplanar magnetic resonance images of the abdomen without intravenous contrast. MRCP sequences were performed. COMPARISON: MR Abdomen 05/17/2013, CT 11/04/2024. CLINICAL HISTORY: CT scan and US -RUQ showed gallstone. No common duct dilation by ultrasound-RUQ. Alcohol  use disorder. FINDINGS: LIMITATIONS/ARTIFACTS: Best obtainable images. Patient unable to tolerate full exam and refused part way through. There is significant motion degradation of the exam. Exam is somewhat limited by patient body motion. LIVER: Severe Hepatic steatosis noted on the opposed phase imaging (series 5). No intrahepatic biliary duct dilatation. GALLBLADDER AND BILIARY SYSTEM: There is a 5 mm gallstone towards the neck of the gallbladder (image 23 of series 4). No gallbladder distention. Small amount of pericholecystic fluid. The common bile duct  appears normal caliber. No evidence of filling defect within the distal common bile duct. No intrahepatic biliary duct dilatation. SPLEEN: Unremarkable. PANCREAS/PANCREATIC DUCT: Visualized pancreas is unremarkable. No pancreatic ductal dilatation. ADRENAL GLANDS: Unremarkable. KIDNEYS: Unremarkable. LYMPH NODES: No enlarged abdominal lymph nodes. VASCULATURE: Unremarkable. PERITONEUM: Moderate intraperitoneal free fluid throughout the upper abdomen along the liver and spleen. Small amount of pericholecystic fluid. ABDOMINAL WALL: No hernia. No mass. BOWEL: Suspicion of submucosal edema within the stomach. Potential diffuse gastritis. No bowel obstruction. No outlet obstruction. BONES: No acute abnormality or worrisome osseous lesion. SOFT TISSUES: Unremarkable. MISCELLANEOUS: Unremarkable. IMPRESSION: 1. Cholelithiasis with a 5 mm stone at the gallbladder neck and small pericholecystic fluid, without gallbladder distention or biliary ductal dilatation; no choledocholithiasis. 2. Severe hepatic steatosis. 3. Suspected diffuse gastritis with submucosal gastric edema. 4. Small to moderate intraperitoneal free fluid in the upper abdomen. 5. No MR evidence of pancreatitis. 6. Exam limited by significant motion artifact and early termination. Electronically signed by: Norleen Boxer MD 11/08/2024 12:37 PM EST RP Workstation: [id number]   DG Chest Port 1 View Result Date: 11/08/2024 EXAM: 1 VIEW(S) XRAY OF THE CHEST 11/08/2024 06:08:00 AM COMPARISON: 11/04/2024 CLINICAL HISTORY: Encounter for intubation [id number]; [id number] Encounter for central line placement [id number] FINDINGS: LINES, TUBES AND DEVICES: Endotracheal tube in place with tip at carina. Right IJ central venous catheter in place with tip in upper right atrium. Cardiac leads and defibrillator pads noted. Cardiac loop recorder or ICD in left chest. LUNGS AND PLEURA: Low lung volumes. No focal pulmonary opacity. No pleural effusion. No pneumothorax. HEART AND  MEDIASTINUM: No acute abnormality of the cardiac and mediastinal silhouettes. BONES AND SOFT TISSUES: No acute osseous abnormality. IMPRESSION: 1. Endotracheal tube tip at the carina; recommend retraction. 2. Right internal jugular central venous catheter tip in the upper right atrium. . The urgent finding will be called to the ordering provider by the Professional Radiology Assistants (PRAs) and documented in the [facility] dashboard. Electronically signed by: Waddell Calk MD 11/08/2024 06:28 AM EST RP Workstation: [id number]   DG Chest Port 1 View Result Date: 11/04/2024 EXAM: 1 VIEW(S) XRAY OF THE CHEST 11/04/2024 11:34:30 PM COMPARISON: 07/27/2024 CLINICAL HISTORY: SOB (shortness of breath) FINDINGS: LUNGS AND PLEURA: No focal pulmonary opacity. No pleural effusion. No pneumothorax. HEART AND MEDIASTINUM: Left chest cardiac loop  recorder or ICD (implantable cardioverter-defibrillator). No acute abnormality of the cardiac and mediastinal silhouettes. BONES AND SOFT TISSUES: No acute osseous abnormality. IMPRESSION: 1. No acute cardiopulmonary process. Electronically signed by: Oneil Devonshire MD 11/04/2024 11:38 PM EST RP Workstation: [id number]   US  Abdomen Limited RUQ (LIVER/GB) Result Date: 11/04/2024 EXAM: Right Upper Quadrant Abdominal Ultrasound 11/04/2024 04:15:13 PM TECHNIQUE: Real-time ultrasonography of the right upper quadrant of the abdomen was performed. COMPARISON: CT abdomen and pelvis 11/04/2024. CLINICAL HISTORY: Upper abdominal pain. FINDINGS: LIVER: Liver echotexture is increased and coarsened with nodular liver contour. There is limited evaluation of the liver secondary to increased echogenicity. Doppler flow is identified in the portal vein, but color flow is not seen most likely due to limitations of the study. No intrahepatic biliary ductal dilatation. No evidence of mass. BILIARY SYSTEM: No nonmobile small echogenic foci are seen in the neck of the gallbladder, likely gallstones.  Gallbladder wall is borderline thickened measuring 3.6 mm. Pericholecystic fluid is present. Common bile duct measures 5.7 mm, which is within normal limits. RIGHT KIDNEY: The right kidney is grossly unremarkable in appearances without evidence of hydronephrosis, echogenic calculi or worrisome mass lesions. PANCREAS: Visualized portions of the pancreas are unremarkable. OTHER: There is a small amount of free fluid in the right upper quadrant. IMPRESSION: 1. Gallstones in the neck of the gallbladder with borderline wall thickening and cholecystic fluid. Negative sonographic murphy sign. Correlate clinically for acute cholecystitis. 2. Nodular liver contour with increased, coarsened echotexture, compatible with cirrhosis; evaluation limited by increased echogenicity. 3. Small amount of free fluid in the right upper quadrant. Electronically signed by: Greig Pique MD 11/04/2024 04:29 PM EST RP Workstation: [id number]   CT ABDOMEN PELVIS W CONTRAST Result Date: 11/04/2024 CLINICAL DATA:  Pancreatitis, acute, severe EXAM: CT ABDOMEN AND PELVIS WITH CONTRAST TECHNIQUE: Multidetector CT imaging of the abdomen and pelvis was performed using the standard protocol following bolus administration of intravenous contrast. RADIATION DOSE REDUCTION: This exam was performed according to the departmental dose-optimization program which includes automated exposure control, adjustment of the mA and/or kV according to patient size and/or use of iterative reconstruction technique. CONTRAST:  OMNIPAQUE  IOHEXOL  300 MG/ML  SOLN COMPARISON:  July 27, 2024, July 27, 2023 1 Apr 14, 2020 FINDINGS: Lower chest: No acute abnormality. Hepatobiliary: Profound hepatic steatosis. Nodular contours of the liver. Cholelithiasis with similar circumferential wall prominence of the gallbladder compared to priors. Perihepatic ascites with overlying varicosities along the RIGHT hepatic margin. Similar appearance of a likely hepatic lipoma  in the LEFT liver along the LEFT portal vein. Main portal vein is patent. Similar appearance of coarse calcifications along the lateral aspect of the portal vein; common bile duct is not well visualized. Pancreas: There are several small hypoenhancing areas along the pancreatic body and tail measure approximately 5 mm (series 5, image 40, 41, 37). Nonspecific fat stranding throughout the epigastric area. Spleen: Spleen measures the upper limits of normal at 12 cm. Perisplenic collaterals. Adrenals/Urinary Tract: Adrenal glands are unremarkable. Markedly lobulated appearance of bilateral kidneys, similar compared to priors. RIGHT renal cyst measuring 39 mm (for which no dedicated imaging follow-up is recommended). Subcentimeter hypodense lesions are too small to accurately characterize. No hydronephrosis or obstructing nephrolithiasis. Bladder is decompressed and unremarkable. Stomach/Bowel: No evidence of bowel obstruction. Circumferential wall thickening throughout the colon. This is most pronounced in the RIGHT hemicolon and likely reflects a degree of underlying portal colopathy. Appendix is normal. Diffuse rugal thickening of the stomach with a prominent pylorus, similar compared  to priors. Wall thickening of the duodenum. Small hiatal hernia with perigastric varices. Vascular/Lymphatic: Severe atherosclerotic calcifications of the nonaneurysmal abdominal aorta. Reproductive: There is heterogeneous expansion of the endometrial canal spanning approximately 12 mm. Fluid within the endometrial canal superiorly. Multiple calcified and hypoenhancing fibroids. Hypoenhancing exophytic mass along the RIGHT superior fundus, likely a fibroid. Other: Small volume ascites. Diffuse fat stranding throughout the mesentery. Musculoskeletal: Likely hemangioma of L5. Subcortical cystic versus erosive change along bilateral sacroiliac joints, similar compared to priors. IMPRESSION: 1. Profound hepatic steatosis with nodular  contours of the liver, suggestive of cirrhosis. There is sequela of portal hypertension including small volume ascites, multiple abdominal collaterals, and favored portal colopathy. 2. Cholelithiasis with similar circumferential wall prominence of the gallbladder compared to priors. This is favored to reflect sequela of underlying liver disease. If there is clinical concern for acute cholecystitis, recommend dedicated RIGHT upper quadrant ultrasound. 3. Nonspecific fat stranding throughout the epigastric area. This could reflect sequela of portal hypertension, pancreatitis or sequela of duodenitis. Recommend correlation with lipase levels. 4. There is wall thickening of the stomach, duodenum and colon. This is nonspecific and could reflect a nonspecific enteritis or sequela of liver disease. 5. There are several small hypoenhancing areas along the pancreatic body and tail measuring approximately 5 mm. These are nonspecific and could reflect small pseudocysts or side branch IPMNs. Recommend further evaluation with nonemergent outpatient pancreatic protocol MRI with and without contrast. 6. There is heterogeneous expansion of the endometrial canal spanning approximately 12 mm. Recommend further evaluation with pelvic ultrasound when clinically appropriate. Aortic Atherosclerosis (ICD10-I70.0). Electronically Signed   By: Corean Salter M.D.   On: 11/04/2024 14:55   CUP PACEART REMOTE DEVICE CHECK Result Date: 10/21/2024 ILR summary report received. Battery status OK. Normal device function. No new symptom, tachy, brady, or pause episodes. 4 new AF episodes, AF burden 0.5%, Eliquis  per EPIC EMR, longest 1 hr 42 min.   Monthly summary reports and ROV/PRN ML, CVRS   Microbiology Recent Results (from the past 240 hours)  Resp panel by RT-PCR (RSV, Flu A&B, Covid) Anterior Nasal Swab     Status: None   Collection Time: 11/04/24 11:06 PM   Specimen: Anterior Nasal Swab  Result Value Ref Range Status   SARS  Coronavirus 2 by RT PCR NEGATIVE NEGATIVE Final    Comment: (NOTE) SARS-CoV-2 target nucleic acids are NOT DETECTED.  The SARS-CoV-2 RNA is generally detectable in upper respiratory specimens during the acute phase of infection. The lowest concentration of SARS-CoV-2 viral copies this assay can detect is 138 copies/mL. A negative result does not preclude SARS-Cov-2 infection and should not be used as the sole basis for treatment or other patient management decisions. A negative result may occur with  improper specimen collection/handling, submission of specimen other than nasopharyngeal swab, presence of viral mutation(s) within the areas targeted by this assay, and inadequate number of viral copies(<138 copies/mL). A negative result must be combined with clinical observations, patient history, and epidemiological information. The expected result is Negative.  Fact Sheet for Patients:  bloggercourse.com  Fact Sheet for Healthcare Providers:  seriousbroker.it  This test is no t yet approved or cleared by the [place]  FDA and  has been authorized for detection and/or diagnosis of SARS-CoV-2 by FDA under an Emergency Use Authorization (EUA). This EUA will remain  in effect (meaning this test can be used) for the duration of the COVID-19 declaration under Section 564(b)(1) of the Act, 21 U.S.C.section 360bbb-3(b)(1), unless the authorization is  terminated  or revoked sooner.       Influenza A by PCR NEGATIVE NEGATIVE Final   Influenza B by PCR NEGATIVE NEGATIVE Final    Comment: (NOTE) The Xpert Xpress SARS-CoV-2/FLU/RSV plus assay is intended as an aid in the diagnosis of influenza from Nasopharyngeal swab specimens and should not be used as a sole basis for treatment. Nasal washings and aspirates are unacceptable for Xpert Xpress SARS-CoV-2/FLU/RSV testing.  Fact Sheet for  Patients: bloggercourse.com  Fact Sheet for Healthcare Providers: seriousbroker.it  This test is not yet approved or cleared by the [place]  FDA and has been authorized for detection and/or diagnosis of SARS-CoV-2 by FDA under an Emergency Use Authorization (EUA). This EUA will remain in effect (meaning this test can be used) for the duration of the COVID-19 declaration under Section 564(b)(1) of the Act, 21 U.S.C. section 360bbb-3(b)(1), unless the authorization is terminated or revoked.     Resp Syncytial Virus by PCR NEGATIVE NEGATIVE Final    Comment: (NOTE) Fact Sheet for Patients: bloggercourse.com  Fact Sheet for Healthcare Providers: seriousbroker.it  This test is not yet approved or cleared by the [place]  FDA and has been authorized for detection and/or diagnosis of SARS-CoV-2 by FDA under an Emergency Use Authorization (EUA). This EUA will remain in effect (meaning this test can be used) for the duration of the COVID-19 declaration under Section 564(b)(1) of the Act, 21 U.S.C. section 360bbb-3(b)(1), unless the authorization is terminated or revoked.  Performed at [facility], [address] [place], [place] [address]   MRSA Next Gen by PCR, Nasal     Status: None   Collection Time: 11/08/24  5:56 AM   Specimen: Nasal Mucosa; Nasal Swab  Result Value Ref Range Status   MRSA by PCR Next Gen NOT DETECTED NOT DETECTED Final    Comment: (NOTE) The GeneXpert MRSA Assay (FDA approved for NASAL specimens only), is one component of a comprehensive MRSA colonization surveillance program. It is not intended to diagnose MRSA infection nor to guide or monitor treatment for MRSA infections. Test performance is not FDA approved in patients less than 33 years old. Performed at [facility], [address] [place], [place] [address]      Lab Basic Metabolic Panel: Recent Labs  Lab 11/04/24 1940 11/05/24 0745 11/08/24 0556 11/08/24 1335 11/09/24 0414 11/10/24 0421 Nov 25, 2024 0410  NA  --    < > 143 143 142 140 142  K  --    < > 3.6 4.1 3.7 3.1* 3.5  CL  --    < > 106 107 107 106 108  CO2  --    < > 16* 17* 20* 18* 19*  GLUCOSE  --    < > 164* 186* 140* 149* 120*  BUN  --    < > 30* 31* 37* 40* 37*  CREATININE  --    < > 1.80* 1.87* 1.82* 1.53* 1.32*  CALCIUM   --    < > 7.6* 8.3* 7.6* 7.2* 7.3*  MG 2.0  --  1.9  --  2.1 1.9 2.0  PHOS 3.7  --  2.2*  --  2.1* 2.9 2.0*   < > = values in this interval not displayed.   Liver Function Tests: Recent Labs  Lab 11/05/24 0745 11/06/24 0436 11/07/24 0331 11/08/24 0556 11/09/24 0414 11/10/24 0421 11/25/24 0410  AST 269* 211* 321* 187*  --  90*  --   ALT 52* 42 51* 44  --  33  --   ALKPHOS 208* 176* 236* 150*  --  106  --   BILITOT 2.5* 2.2* 3.2* 2.9*  --  2.0*  --   PROT 7.2 6.4* 5.8* 4.9*  --  4.6*  --   ALBUMIN 3.5 3.0* 2.9* 2.5* 2.6* 2.3*  2.3* 2.5*   Recent Labs  Lab 11/04/24 1940 11/08/24 0522  LIPASE 434* 60*   Recent Labs  Lab 11/04/24 1940 11/08/24 0556  AMMONIA 43* 243*   CBC: Recent Labs  Lab 11/07/24 0331 11/07/24 2230 11/08/24 0556 11/08/24 1335 11/09/24 0414 11/09/24 0506 11/10/24 0421 11/10/24 1101 11/10/24 1727 11/10/24 2200 02-Dec-2024 0410  WBC 4.6  --  8.1  --  8.2  --  7.6  --   --   --  6.0  HGB 6.0*   < > 5.5*   < > 8.1*   < > 8.0* 7.4* 7.5* 8.0* 8.1*  HCT 18.3*   < > 16.5*   < > 23.5*   < > 22.9* 21.3* 21.6* 23.5* 24.2*  MCV 92.9  --  91.7  --  89.0  --  88.1  --   --   --  90.3  PLT 69*  --  92*  --  72*  --  48*  --   --   --  36*   < > = values in this interval not displayed.   Cardiac Enzymes: Recent Labs  Lab 11/09/24 0414  CKTOTAL 60   Sepsis Labs: Recent Labs  Lab 11/08/24 0522 11/08/24 0556 11/08/24 0837 11/08/24 2334 11/09/24 0217 11/09/24 0414 11/09/24 0506 11/09/24 2058 11/10/24 0421  Dec 02, 2024 0410  PROCALCITON 5.34  --   --   --   --   --   --   --   --   --   WBC  --  8.1  --   --   --  8.2  --   --  7.6 6.0  LATICACIDVEN  --  >9.0*   < > 5.0* 4.9*  --  5.2* 6.1*  --   --    < > = values in this interval not displayed.    Latanya Hemmer 2024/12/02, 5:45 PM   "

## 2024-11-23 NOTE — Progress Notes (Addendum)
 Patient with no heart rate for one minute via apical auscultation as well as no respirations at 1705.  Patient first contact, Kathryn [name], notified of patient passing and stated family is coming to bedside to see the patient.  Provider notified of passing as well.

## 2024-11-23 NOTE — Plan of Care (Signed)
 Continuing with plan of care.

## 2024-11-23 NOTE — Progress Notes (Signed)
 Patient extubated by RT as ordered by provider at 1350.

## 2024-11-23 DEATH — deceased

## 2024-12-20 ENCOUNTER — Encounter

## 2024-12-21 ENCOUNTER — Ambulatory Visit

## 2025-01-20 ENCOUNTER — Encounter

## 2025-01-21 ENCOUNTER — Ambulatory Visit
